# Patient Record
Sex: Male | Born: 1945
Health system: Southern US, Community
[De-identification: ages and names within clinical notes are randomized; demographics above are authoritative.]

## PROBLEM LIST (undated history)

## (undated) DIAGNOSIS — M5430 Sciatica, unspecified side: Secondary | ICD-10-CM

## (undated) DIAGNOSIS — M2550 Pain in unspecified joint: Secondary | ICD-10-CM

## (undated) DIAGNOSIS — M069 Rheumatoid arthritis, unspecified: Secondary | ICD-10-CM

## (undated) DIAGNOSIS — K219 Gastro-esophageal reflux disease without esophagitis: Secondary | ICD-10-CM

## (undated) DIAGNOSIS — I471 Supraventricular tachycardia: Secondary | ICD-10-CM

## (undated) DIAGNOSIS — I5189 Other ill-defined heart diseases: Secondary | ICD-10-CM

## (undated) DIAGNOSIS — H269 Unspecified cataract: Secondary | ICD-10-CM

## (undated) DIAGNOSIS — E7800 Pure hypercholesterolemia, unspecified: Secondary | ICD-10-CM

## (undated) DIAGNOSIS — R351 Nocturia: Secondary | ICD-10-CM

## (undated) DIAGNOSIS — G2581 Restless legs syndrome: Secondary | ICD-10-CM

## (undated) DIAGNOSIS — I491 Atrial premature depolarization: Secondary | ICD-10-CM

## (undated) DIAGNOSIS — K5900 Constipation, unspecified: Secondary | ICD-10-CM

## (undated) DIAGNOSIS — R011 Cardiac murmur, unspecified: Secondary | ICD-10-CM

## (undated) DIAGNOSIS — Z8709 Personal history of other diseases of the respiratory system: Secondary | ICD-10-CM

## (undated) DIAGNOSIS — K649 Unspecified hemorrhoids: Secondary | ICD-10-CM

## (undated) DIAGNOSIS — I4719 Other supraventricular tachycardia: Secondary | ICD-10-CM

## (undated) DIAGNOSIS — I951 Orthostatic hypotension: Secondary | ICD-10-CM

## (undated) DIAGNOSIS — I2510 Atherosclerotic heart disease of native coronary artery without angina pectoris: Secondary | ICD-10-CM

## (undated) DIAGNOSIS — I10 Essential (primary) hypertension: Secondary | ICD-10-CM

## (undated) DIAGNOSIS — R069 Unspecified abnormalities of breathing: Secondary | ICD-10-CM

## (undated) DIAGNOSIS — M1990 Unspecified osteoarthritis, unspecified site: Secondary | ICD-10-CM

## (undated) DIAGNOSIS — I493 Ventricular premature depolarization: Secondary | ICD-10-CM

## (undated) DIAGNOSIS — R002 Palpitations: Secondary | ICD-10-CM

## (undated) DIAGNOSIS — R001 Bradycardia, unspecified: Secondary | ICD-10-CM

## (undated) DIAGNOSIS — R079 Chest pain, unspecified: Secondary | ICD-10-CM

## (undated) HISTORY — PX: SHOULDER SURGERY: SHX246

## (undated) HISTORY — PX: TONSILLECTOMY: SUR1361

## (undated) HISTORY — DX: Restless legs syndrome: G25.81

## (undated) HISTORY — DX: Atherosclerotic heart disease of native coronary artery without angina pectoris: I25.10

## (undated) HISTORY — DX: Palpitations: R00.2

## (undated) HISTORY — DX: Sciatica, unspecified side: M54.30

## (undated) HISTORY — PX: KNEE ARTHROSCOPY: SUR90

## (undated) HISTORY — DX: Bradycardia, unspecified: R00.1

## (undated) HISTORY — DX: Orthostatic hypotension: I95.1

## (undated) HISTORY — DX: Ventricular premature depolarization: I49.3

## (undated) HISTORY — DX: Gastro-esophageal reflux disease without esophagitis: K21.9

## (undated) HISTORY — PX: ACHILLES TENDON REPAIR: SUR1153

## (undated) HISTORY — DX: Supraventricular tachycardia: I47.1

## (undated) HISTORY — DX: Unspecified osteoarthritis, unspecified site: M19.90

## (undated) HISTORY — DX: Chest pain, unspecified: R07.9

## (undated) HISTORY — PX: HAND SURGERY: SHX662

## (undated) HISTORY — DX: Essential (primary) hypertension: I10

## (undated) HISTORY — PX: FLEXIBLE SIGMOIDOSCOPY: SHX1649

## (undated) HISTORY — DX: Rheumatoid arthritis, unspecified: M06.9

## (undated) HISTORY — DX: Other supraventricular tachycardia: I47.19

## (undated) HISTORY — DX: Unspecified abnormalities of breathing: R06.9

## (undated) HISTORY — PX: TOE FUSION: SHX1070

## (undated) HISTORY — PX: HEEL SPUR EXCISION: SHX1733

## (undated) HISTORY — PX: COLONOSCOPY: SHX174

## (undated) HISTORY — DX: Pure hypercholesterolemia, unspecified: E78.00

## (undated) HISTORY — DX: Atrial premature depolarization: I49.1

## (undated) HISTORY — DX: Other ill-defined heart diseases: I51.89

---

## 2007-12-18 ENCOUNTER — Encounter: Admission: RE | Admit: 2007-12-18 | Discharge: 2007-12-18 | Payer: Self-pay | Admitting: Orthopaedic Surgery

## 2008-02-16 ENCOUNTER — Encounter: Admission: RE | Admit: 2008-02-16 | Discharge: 2008-02-16 | Payer: Self-pay | Admitting: Orthopaedic Surgery

## 2009-07-18 ENCOUNTER — Encounter: Admission: RE | Admit: 2009-07-18 | Discharge: 2009-07-18 | Payer: Self-pay | Admitting: Orthopaedic Surgery

## 2009-12-08 ENCOUNTER — Encounter: Admission: RE | Admit: 2009-12-08 | Discharge: 2009-12-08 | Payer: Self-pay | Admitting: Family Medicine

## 2010-01-07 ENCOUNTER — Ambulatory Visit (HOSPITAL_COMMUNITY): Admission: RE | Admit: 2010-01-07 | Discharge: 2010-01-07 | Payer: Self-pay | Admitting: Urology

## 2010-02-18 ENCOUNTER — Ambulatory Visit (HOSPITAL_COMMUNITY): Admission: RE | Admit: 2010-02-18 | Discharge: 2010-02-18 | Payer: Self-pay | Admitting: Urology

## 2010-12-26 HISTORY — PX: CARDIAC CATHETERIZATION: SHX172

## 2011-01-16 ENCOUNTER — Encounter: Payer: Self-pay | Admitting: Urology

## 2011-02-24 DIAGNOSIS — I2510 Atherosclerotic heart disease of native coronary artery without angina pectoris: Secondary | ICD-10-CM

## 2011-02-24 HISTORY — DX: Atherosclerotic heart disease of native coronary artery without angina pectoris: I25.10

## 2011-03-02 ENCOUNTER — Inpatient Hospital Stay (HOSPITAL_BASED_OUTPATIENT_CLINIC_OR_DEPARTMENT_OTHER)
Admission: RE | Admit: 2011-03-02 | Discharge: 2011-03-02 | Disposition: A | Discharge status: Home / Self Care | Payer: Federal, State, Local not specified - PPO | Source: Ambulatory Visit | Attending: Cardiology | Admitting: Cardiology

## 2011-03-02 DIAGNOSIS — I2510 Atherosclerotic heart disease of native coronary artery without angina pectoris: Secondary | ICD-10-CM | POA: Insufficient documentation

## 2011-03-02 DIAGNOSIS — R079 Chest pain, unspecified: Secondary | ICD-10-CM | POA: Insufficient documentation

## 2011-03-05 NOTE — Procedures (Signed)
NAME:  Christian [name], Christian [name]                 ACCOUNT NO.:  [id number]  MEDICAL RECORD NO.:  [id number]            PATIENT TYPE:  LOCATION:                                 FACILITY:  PHYSICIAN:  [name], M.D.          DATE OF BIRTH:  DATE OF PROCEDURE:  03/02/2011 DATE OF DISCHARGE:                           CARDIAC CATHETERIZATION   PROCEDURES:  Left heart catheterization, coronary angiography, left ventriculography.  OPERATOR:  [name], MD  INDICATIONS:  Chest pain.  COMPLICATIONS:  None.  IV access via right femoral artery 4-French sheath.  This is a 65 year old male who presents with episodes of chest pain and now presents for cardiac catheterization.  The patient was brought to the cardiac catheterization laboratory in a fasting nonsedated state.  Informed consent was obtained.  The patient was connected to continuous heart rate and pulse oximetry monitoring and blood pressure monitoring.  The right groin was prepped and draped in a sterile fashion.  A 1% Xylocaine was used for local anesthesia.  Using modified Seldinger technique, a 4-French sheath was placed in right femoral artery.  Under fluoroscopic guidance, a 4-French JL-4 catheter was placed in the left coronary artery.  Multiple cine films were taken at 30-degree RAO and 40-degree LAO views.  This catheter was then exchanged out over a guidewire for a 4-French 3-D RCA catheter which successfully engaged the right coronary ostium.  Multiple cine films were taken at 30-degree RAO and 40-degree LAO views.  This catheter was then exchanged out over a guidewire for a 4-French angled pigtail catheter which was placed under fluoroscopic guidance into left ventricular cavity.  Left ventriculography was performed in the 30- degree RAO views and total of 25 mL of contrast at 12 mL per second. The catheter was then pulled back across the aortic valve with no significant gradient noted.  At the end of procedure, all  catheters and sheaths were removed.  Manual compression was performed until adequate hemostasis was obtained.  The patient was transferred back to room in stable condition.  RESULTS: 1. Left main coronary is widely patent and bifurcates left anterior     descending artery and left circumflex artery. 2. Left anterior descending artery is widely patent throughout its     course with a 20-30% eccentric lesion in the midportion of the LAD.     The LAD gives rise to two diagonal branches.  Both diagonals are     very small.  The first diagonal has an 80% mid stenosis.  The     second diagonal is patent, but very small.  The left circumflex is widely patent throughout its course.  It gives rise to a first obtuse marginal branch which was small-to-moderate size and patent.  Just after the takeoff of the first obtuse marginal, there was a 20-30% eccentric lesion in the left circumflex.  The ongoing circumflex gives rise to a second obtuse marginal branch which is a large vessel and widely patent.  The right coronary is widely patent throughout its course distally and bifurcates into posterior descending artery and posterior lateral  artery.  There is an eccentric lesion of 20-30% in the proximal RCA. 1. Left ventriculography shows normal LV function, EF 60%, LV pressure     109/4 mmHg aortic pressure 108/70 mmHg, LVEDP 10 mmHg.  ASSESSMENT: 1. 80% small diagonal one too small for PCI. 2. Nonobstructive disease in left circumflex RCA and LAD. 3. Normal LV function. 4. Normal LVEDP.  PLAN:  Discharge home after IV fluid and bedrest.  Plan medical management, we will start Imdur 30 mg daily for possible angina induced by the tight diagonal stenosis which is too small for PCI.  The patient will follow up with me in 2 weeks.     [name], M.D.     TT/MEDQ  D:  03/03/2011  T:  03/04/2011  Job:  [id number]  Electronically Signed by [name] M.D. on 03/05/2011 02:07:15 PM

## 2011-03-13 LAB — CBC
HCT: 41.6 % (ref 39.0–52.0)
Hemoglobin: 14.4 g/dL (ref 13.0–17.0)
MCHC: 34.6 g/dL (ref 30.0–36.0)
MCV: 93.3 fL (ref 78.0–100.0)
Platelets: 193 10*3/uL (ref 150–400)
RBC: 4.46 MIL/uL (ref 4.22–5.81)
RDW: 13.1 % (ref 11.5–15.5)
WBC: 7.7 10*3/uL (ref 4.0–10.5)

## 2011-03-13 LAB — PROTIME-INR
INR: 0.96 (ref 0.00–1.49)
Prothrombin Time: 12.7 seconds (ref 11.6–15.2)

## 2011-03-13 LAB — BODY FLUID CULTURE
Culture: NO GROWTH
Gram Stain: NONE SEEN

## 2011-03-13 LAB — APTT: aPTT: 27 seconds (ref 24–37)

## 2011-03-18 LAB — CBC
HCT: 40.4 % (ref 39.0–52.0)
Hemoglobin: 14.2 g/dL (ref 13.0–17.0)
MCHC: 35.1 g/dL (ref 30.0–36.0)
MCV: 93.1 fL (ref 78.0–100.0)
Platelets: 206 10*3/uL (ref 150–400)
RBC: 4.34 MIL/uL (ref 4.22–5.81)
RDW: 13.2 % (ref 11.5–15.5)
WBC: 7.2 10*3/uL (ref 4.0–10.5)

## 2012-04-09 DIAGNOSIS — H04129 Dry eye syndrome of unspecified lacrimal gland: Secondary | ICD-10-CM | POA: Diagnosis not present

## 2012-04-09 DIAGNOSIS — H01139 Eczematous dermatitis of unspecified eye, unspecified eyelid: Secondary | ICD-10-CM | POA: Diagnosis not present

## 2012-04-09 DIAGNOSIS — H47329 Drusen of optic disc, unspecified eye: Secondary | ICD-10-CM | POA: Diagnosis not present

## 2012-04-09 DIAGNOSIS — H40019 Open angle with borderline findings, low risk, unspecified eye: Secondary | ICD-10-CM | POA: Diagnosis not present

## 2012-04-09 DIAGNOSIS — H2510 Age-related nuclear cataract, unspecified eye: Secondary | ICD-10-CM | POA: Diagnosis not present

## 2012-04-24 DIAGNOSIS — Z0000 Encounter for general adult medical examination without abnormal findings: Secondary | ICD-10-CM | POA: Diagnosis not present

## 2012-04-24 DIAGNOSIS — E7800 Pure hypercholesterolemia, unspecified: Secondary | ICD-10-CM | POA: Diagnosis not present

## 2012-04-24 DIAGNOSIS — Z23 Encounter for immunization: Secondary | ICD-10-CM | POA: Diagnosis not present

## 2012-04-24 DIAGNOSIS — I10 Essential (primary) hypertension: Secondary | ICD-10-CM | POA: Diagnosis not present

## 2012-04-24 DIAGNOSIS — Z1211 Encounter for screening for malignant neoplasm of colon: Secondary | ICD-10-CM | POA: Diagnosis not present

## 2012-04-24 DIAGNOSIS — Z125 Encounter for screening for malignant neoplasm of prostate: Secondary | ICD-10-CM | POA: Diagnosis not present

## 2012-04-24 DIAGNOSIS — M159 Polyosteoarthritis, unspecified: Secondary | ICD-10-CM | POA: Diagnosis not present

## 2012-04-30 DIAGNOSIS — E7800 Pure hypercholesterolemia, unspecified: Secondary | ICD-10-CM | POA: Diagnosis not present

## 2012-04-30 DIAGNOSIS — I2510 Atherosclerotic heart disease of native coronary artery without angina pectoris: Secondary | ICD-10-CM | POA: Diagnosis not present

## 2012-04-30 DIAGNOSIS — I10 Essential (primary) hypertension: Secondary | ICD-10-CM | POA: Diagnosis not present

## 2012-05-18 DIAGNOSIS — Z87891 Personal history of nicotine dependence: Secondary | ICD-10-CM | POA: Diagnosis not present

## 2012-05-22 ENCOUNTER — Other Ambulatory Visit: Payer: Self-pay | Admitting: Gastroenterology

## 2012-05-22 DIAGNOSIS — Z1211 Encounter for screening for malignant neoplasm of colon: Secondary | ICD-10-CM | POA: Diagnosis not present

## 2012-05-22 DIAGNOSIS — K620 Anal polyp: Secondary | ICD-10-CM | POA: Diagnosis not present

## 2012-05-22 DIAGNOSIS — D126 Benign neoplasm of colon, unspecified: Secondary | ICD-10-CM | POA: Diagnosis not present

## 2012-05-22 DIAGNOSIS — K621 Rectal polyp: Secondary | ICD-10-CM | POA: Diagnosis not present

## 2012-05-22 DIAGNOSIS — K648 Other hemorrhoids: Secondary | ICD-10-CM | POA: Diagnosis not present

## 2012-06-10 DIAGNOSIS — H5501 Congenital nystagmus: Secondary | ICD-10-CM | POA: Diagnosis not present

## 2012-06-10 DIAGNOSIS — L298 Other pruritus: Secondary | ICD-10-CM | POA: Diagnosis not present

## 2012-06-10 DIAGNOSIS — L2989 Other pruritus: Secondary | ICD-10-CM | POA: Diagnosis not present

## 2012-06-10 DIAGNOSIS — M79609 Pain in unspecified limb: Secondary | ICD-10-CM | POA: Diagnosis not present

## 2012-06-10 DIAGNOSIS — R42 Dizziness and giddiness: Secondary | ICD-10-CM | POA: Diagnosis not present

## 2012-06-22 DIAGNOSIS — Z0000 Encounter for general adult medical examination without abnormal findings: Secondary | ICD-10-CM | POA: Diagnosis not present

## 2012-06-22 DIAGNOSIS — R42 Dizziness and giddiness: Secondary | ICD-10-CM | POA: Diagnosis not present

## 2012-06-22 DIAGNOSIS — H5501 Congenital nystagmus: Secondary | ICD-10-CM | POA: Diagnosis not present

## 2012-06-22 DIAGNOSIS — E7800 Pure hypercholesterolemia, unspecified: Secondary | ICD-10-CM | POA: Diagnosis not present

## 2012-06-22 DIAGNOSIS — Z79899 Other long term (current) drug therapy: Secondary | ICD-10-CM | POA: Diagnosis not present

## 2012-06-22 DIAGNOSIS — M79609 Pain in unspecified limb: Secondary | ICD-10-CM | POA: Diagnosis not present

## 2012-06-22 DIAGNOSIS — I10 Essential (primary) hypertension: Secondary | ICD-10-CM | POA: Diagnosis not present

## 2012-06-22 DIAGNOSIS — M159 Polyosteoarthritis, unspecified: Secondary | ICD-10-CM | POA: Diagnosis not present

## 2012-10-22 DIAGNOSIS — Z23 Encounter for immunization: Secondary | ICD-10-CM | POA: Diagnosis not present

## 2012-10-22 DIAGNOSIS — E7800 Pure hypercholesterolemia, unspecified: Secondary | ICD-10-CM | POA: Diagnosis not present

## 2012-10-22 DIAGNOSIS — M159 Polyosteoarthritis, unspecified: Secondary | ICD-10-CM | POA: Diagnosis not present

## 2012-10-22 DIAGNOSIS — Z79899 Other long term (current) drug therapy: Secondary | ICD-10-CM | POA: Diagnosis not present

## 2012-10-22 DIAGNOSIS — L509 Urticaria, unspecified: Secondary | ICD-10-CM | POA: Diagnosis not present

## 2012-10-22 DIAGNOSIS — I10 Essential (primary) hypertension: Secondary | ICD-10-CM | POA: Diagnosis not present

## 2012-11-06 DIAGNOSIS — I2510 Atherosclerotic heart disease of native coronary artery without angina pectoris: Secondary | ICD-10-CM | POA: Diagnosis not present

## 2012-11-06 DIAGNOSIS — E7800 Pure hypercholesterolemia, unspecified: Secondary | ICD-10-CM | POA: Diagnosis not present

## 2012-11-06 DIAGNOSIS — I10 Essential (primary) hypertension: Secondary | ICD-10-CM | POA: Diagnosis not present

## 2012-11-06 DIAGNOSIS — Z79899 Other long term (current) drug therapy: Secondary | ICD-10-CM | POA: Diagnosis not present

## 2012-12-14 DIAGNOSIS — Z79899 Other long term (current) drug therapy: Secondary | ICD-10-CM | POA: Diagnosis not present

## 2012-12-14 DIAGNOSIS — E7800 Pure hypercholesterolemia, unspecified: Secondary | ICD-10-CM | POA: Diagnosis not present

## 2013-02-04 DIAGNOSIS — M79609 Pain in unspecified limb: Secondary | ICD-10-CM | POA: Diagnosis not present

## 2013-02-04 DIAGNOSIS — E7800 Pure hypercholesterolemia, unspecified: Secondary | ICD-10-CM | POA: Diagnosis not present

## 2013-04-10 DIAGNOSIS — H40019 Open angle with borderline findings, low risk, unspecified eye: Secondary | ICD-10-CM | POA: Diagnosis not present

## 2013-04-10 DIAGNOSIS — H2510 Age-related nuclear cataract, unspecified eye: Secondary | ICD-10-CM | POA: Diagnosis not present

## 2013-04-10 DIAGNOSIS — H538 Other visual disturbances: Secondary | ICD-10-CM | POA: Diagnosis not present

## 2013-04-10 DIAGNOSIS — H04129 Dry eye syndrome of unspecified lacrimal gland: Secondary | ICD-10-CM | POA: Diagnosis not present

## 2013-04-22 DIAGNOSIS — M159 Polyosteoarthritis, unspecified: Secondary | ICD-10-CM | POA: Diagnosis not present

## 2013-04-22 DIAGNOSIS — I10 Essential (primary) hypertension: Secondary | ICD-10-CM | POA: Diagnosis not present

## 2013-04-22 DIAGNOSIS — E7800 Pure hypercholesterolemia, unspecified: Secondary | ICD-10-CM | POA: Diagnosis not present

## 2013-04-22 DIAGNOSIS — Z125 Encounter for screening for malignant neoplasm of prostate: Secondary | ICD-10-CM | POA: Diagnosis not present

## 2013-05-08 DIAGNOSIS — Z79899 Other long term (current) drug therapy: Secondary | ICD-10-CM | POA: Diagnosis not present

## 2013-05-08 DIAGNOSIS — E7800 Pure hypercholesterolemia, unspecified: Secondary | ICD-10-CM | POA: Diagnosis not present

## 2013-05-08 DIAGNOSIS — I10 Essential (primary) hypertension: Secondary | ICD-10-CM | POA: Diagnosis not present

## 2013-05-08 DIAGNOSIS — I2510 Atherosclerotic heart disease of native coronary artery without angina pectoris: Secondary | ICD-10-CM | POA: Diagnosis not present

## 2013-06-19 DIAGNOSIS — E7800 Pure hypercholesterolemia, unspecified: Secondary | ICD-10-CM | POA: Diagnosis not present

## 2013-06-19 DIAGNOSIS — Z79899 Other long term (current) drug therapy: Secondary | ICD-10-CM | POA: Diagnosis not present

## 2013-09-14 ENCOUNTER — Other Ambulatory Visit: Payer: Self-pay | Admitting: Cardiology

## 2013-09-14 DIAGNOSIS — E7800 Pure hypercholesterolemia, unspecified: Secondary | ICD-10-CM

## 2013-09-14 DIAGNOSIS — Z79899 Other long term (current) drug therapy: Secondary | ICD-10-CM

## 2013-10-22 DIAGNOSIS — G2581 Restless legs syndrome: Secondary | ICD-10-CM | POA: Diagnosis not present

## 2013-10-22 DIAGNOSIS — Z23 Encounter for immunization: Secondary | ICD-10-CM | POA: Diagnosis not present

## 2013-10-22 DIAGNOSIS — L2089 Other atopic dermatitis: Secondary | ICD-10-CM | POA: Diagnosis not present

## 2013-10-22 DIAGNOSIS — E7800 Pure hypercholesterolemia, unspecified: Secondary | ICD-10-CM | POA: Diagnosis not present

## 2013-10-22 DIAGNOSIS — M159 Polyosteoarthritis, unspecified: Secondary | ICD-10-CM | POA: Diagnosis not present

## 2013-10-22 DIAGNOSIS — I10 Essential (primary) hypertension: Secondary | ICD-10-CM | POA: Diagnosis not present

## 2013-11-13 ENCOUNTER — Encounter: Payer: Self-pay | Admitting: Cardiology

## 2013-11-13 DIAGNOSIS — E7800 Pure hypercholesterolemia, unspecified: Secondary | ICD-10-CM | POA: Insufficient documentation

## 2013-11-13 DIAGNOSIS — I951 Orthostatic hypotension: Secondary | ICD-10-CM | POA: Insufficient documentation

## 2013-11-15 ENCOUNTER — Telehealth: Payer: Self-pay | Admitting: Cardiology

## 2013-11-15 ENCOUNTER — Encounter: Payer: Self-pay | Admitting: Cardiology

## 2013-11-15 ENCOUNTER — Telehealth: Payer: Self-pay | Admitting: Pharmacist

## 2013-11-15 ENCOUNTER — Ambulatory Visit (INDEPENDENT_AMBULATORY_CARE_PROVIDER_SITE_OTHER): Payer: Medicare Other | Admitting: Cardiology

## 2013-11-15 VITALS — BP 130/90 | HR 72 | Ht 71.0 in | Wt 276.0 lb

## 2013-11-15 DIAGNOSIS — E7800 Pure hypercholesterolemia, unspecified: Secondary | ICD-10-CM

## 2013-11-15 DIAGNOSIS — I5189 Other ill-defined heart diseases: Secondary | ICD-10-CM | POA: Insufficient documentation

## 2013-11-15 DIAGNOSIS — Z79899 Other long term (current) drug therapy: Secondary | ICD-10-CM

## 2013-11-15 DIAGNOSIS — I951 Orthostatic hypotension: Secondary | ICD-10-CM

## 2013-11-15 DIAGNOSIS — I519 Heart disease, unspecified: Secondary | ICD-10-CM

## 2013-11-15 DIAGNOSIS — I2510 Atherosclerotic heart disease of native coronary artery without angina pectoris: Secondary | ICD-10-CM

## 2013-11-15 DIAGNOSIS — I10 Essential (primary) hypertension: Secondary | ICD-10-CM

## 2013-11-15 DIAGNOSIS — E785 Hyperlipidemia, unspecified: Secondary | ICD-10-CM

## 2013-11-15 LAB — BASIC METABOLIC PANEL
CO2: 28 mEq/L (ref 19–32)
Chloride: 104 mEq/L (ref 96–112)
GFR: 68.87 mL/min (ref >60.00)

## 2013-11-15 LAB — LIPID PANEL
Cholesterol: 147 mg/dL (ref 0–200)
LDL Cholesterol: 86 mg/dL (ref 0–99)
VLDL: 14.6 mg/dL (ref 0.0–40.0)

## 2013-11-15 MED ORDER — ATORVASTATIN CALCIUM 10 MG PO TABS: 10.0000 mg | ORAL_TABLET | Freq: Every day | ORAL | Status: DC

## 2013-11-15 NOTE — Telephone Encounter (Signed)
Patient insurance cost on Crestor is going up, and he needs to change to a different therapy.  He is currently on Crestor 5 mg qd, as he can't tolerate a higher dose.  Patient failed simvastatin, and couldn't tolerate lipitor higher than 10 mg daily.  LDL is 86 mg/dL today on Crestor 5 mg qd.  We will change him back to atoravastatin for cost reasons, and start him at just 5 mg qd for 4 weeks, then increase to lipitor 10 mg qd thereafter.  Patient agreeable to this.  He has some lipitor 10 mg at home still.  He will run out of Crestor in 4 weeks, then start lipitor 10 mg at 1/2 tablet daily, then increase up to 10 mg daily thereafter.  Patient will add Co-Q 10 if for some reasons muscle aches come back while on Lipitor.  He will recheck bloodwork in 4 months, then see me 1 day later.  Rx sent to pharmacy, and lab set up.  Forwarding to Dr. Mayford Knife as Lorain Childes.

## 2013-11-15 NOTE — Telephone Encounter (Signed)
New Problem  Pt requests a call back to discuss cholesterol medication// please call

## 2013-11-15 NOTE — Telephone Encounter (Signed)
Discussed with patient. See other phone message.

## 2013-11-15 NOTE — Patient Instructions (Signed)
Your physician recommends that you continue on your current medications as directed. Please refer to the Current Medication list given to you today.  Your physician recommends that you go to the lab today for BMET, Lipid Panel, and ALT  Your physician wants you to follow-up in: 6 Months with Dr Sherlyn Lick will receive a reminder letter in the mail two months in advance. If you don't receive a letter, please call our office to schedule the follow-up appointment.

## 2013-11-15 NOTE — Progress Notes (Addendum)
[address] [place], [place]  [address] Phone: [phone] Fax:  [phone]  Date:  11/15/2013   ID:  IHAN PAT, DOB Feb 15, 1946, MRN [id number]  PCP:  Lupe Carney, MD  Cardiologist:  [name], MD     History of Present Illness: Christian [name] is a 67 y.o. male with a history of ASCAD, HTN and dyslipidemia who presents today for followup. He is doing well.  He denies any chest pain, SOB, DOE, LE edema, dizziness, palpitations or syncope.  He needs to change lipid lowering agent in January due to change in insurance.    Wt Readings from Last 3 Encounters:  11/15/13 276 lb (125.193 kg)     Past Medical History  Diagnosis Date  . Orthostatic hypotension   . Sciatica   . Arthritis   . Renal cyst, left   . Coronary artery disease 3/12    80% stenosis of diagonal too small for PCI  . Hypercholesterolemia   . Diastolic dysfunction   . Hypertension     Current Outpatient Prescriptions  Medication Sig Dispense Refill  . aspirin 81 MG tablet Take 81 mg by mouth daily.      . diphenhydrAMINE (BENADRYL) 25 MG tablet Take 25 mg by mouth 2 (two) times daily as needed.      [name] Kitchen ibuprofen (ADVIL,MOTRIN) 200 MG tablet Take 200 mg by mouth every 4 (four) hours as needed.      . isosorbide mononitrate (IMDUR) 30 MG 24 hr tablet Take 30 mg by mouth daily.      [name] Kitchen lisinopril (PRINIVIL,ZESTRIL) 10 MG tablet Take 10 mg by mouth daily.      . nitroGLYCERIN (NITROSTAT) 0.4 MG SL tablet Place 0.4 mg under the tongue every 5 (five) minutes as needed for chest pain.      . rosuvastatin (CRESTOR) 5 MG tablet Take 5 mg by mouth daily.        No current facility-administered medications for this visit.    Allergies:    Allergies  Allergen Reactions  . Atorvastatin Calcium [Atorvastatin] Other (See Comments)    Myalgia  . Pravastatin Other (See Comments)    Leg Cramps  . Simvastatin Other (See Comments)    Myalgia    Social History:  The patient  reports that he  quit smoking about 31 years ago. He does not have any smokeless tobacco history on file. He reports that he drinks alcohol. He reports that he does not use illicit drugs.   Family History:  The patient's family history includes CVA in his father and mother; Heart disease in his father.   ROS:  Please see the history of present illness.      All other systems reviewed and negative.   PHYSICAL EXAM: VS:  BP 130/90  Pulse 72  Ht 5\' 11"  (1.803 m)  Wt 276 lb (125.193 kg)  BMI 38.51 kg/m2 Well nourished, well developed, in no acute distress HEENT: normal Neck: no JVD Cardiac:  normal S1, S2; RRR; no murmur Lungs:  clear to auscultation bilaterally, no wheezing, rhonchi or rales Abd: soft, nontender, no hepatomegaly Ext: no edema Skin: warm and dry Neuro:  CNs 2-12 intact, no focal abnormalities noted  EKG:  Sinus bradycardia at 50bpm  ASSESSMENT AND PLAN:  1. ASCAD with no angina  - continue ASA/Imdur 2. HTN - mildly elevated DBP this am - at home it runs 110/18mmHg  - he will continue on Lisinopril   - check BMET 3.  Dyslipidemia - his insurance is going up in January and he says he cannot afford his med and needs a different med.  - will recheck fasting lipids and ALT  - lipid clinic for guidance in statin to change to 4.  Orthostatic hypotension - fairly well controlled - still with mild dizziness on occasiona with going from sitting to standing 5.  Asymptomatic bradycardia  Followup with me in 6 months  Signed, [name], MD 11/15/2013 9:13 AM

## 2013-12-24 ENCOUNTER — Other Ambulatory Visit: Payer: Federal, State, Local not specified - PPO

## 2013-12-27 DIAGNOSIS — M25569 Pain in unspecified knee: Secondary | ICD-10-CM | POA: Diagnosis not present

## 2013-12-27 DIAGNOSIS — M25559 Pain in unspecified hip: Secondary | ICD-10-CM | POA: Diagnosis not present

## 2014-01-20 DIAGNOSIS — M25569 Pain in unspecified knee: Secondary | ICD-10-CM | POA: Diagnosis not present

## 2014-02-03 DIAGNOSIS — M1710 Unilateral primary osteoarthritis, unspecified knee: Secondary | ICD-10-CM | POA: Diagnosis not present

## 2014-02-10 DIAGNOSIS — M1710 Unilateral primary osteoarthritis, unspecified knee: Secondary | ICD-10-CM | POA: Diagnosis not present

## 2014-02-11 ENCOUNTER — Encounter: Payer: Self-pay | Admitting: Pharmacist

## 2014-02-12 ENCOUNTER — Other Ambulatory Visit: Payer: Self-pay | Admitting: General Surgery

## 2014-02-12 MED ORDER — LISINOPRIL 10 MG PO TABS: 10.0000 mg | ORAL_TABLET | Freq: Every day | ORAL | Status: DC

## 2014-02-12 MED ORDER — ROSUVASTATIN CALCIUM 5 MG PO TABS: 5.0000 mg | ORAL_TABLET | Freq: Every day | ORAL | Status: DC

## 2014-02-17 DIAGNOSIS — M1710 Unilateral primary osteoarthritis, unspecified knee: Secondary | ICD-10-CM | POA: Diagnosis not present

## 2014-02-24 DIAGNOSIS — M1710 Unilateral primary osteoarthritis, unspecified knee: Secondary | ICD-10-CM | POA: Diagnosis not present

## 2014-03-03 DIAGNOSIS — M1710 Unilateral primary osteoarthritis, unspecified knee: Secondary | ICD-10-CM | POA: Diagnosis not present

## 2014-03-04 ENCOUNTER — Other Ambulatory Visit (INDEPENDENT_AMBULATORY_CARE_PROVIDER_SITE_OTHER): Payer: Medicare Other

## 2014-03-04 DIAGNOSIS — E785 Hyperlipidemia, unspecified: Secondary | ICD-10-CM | POA: Diagnosis not present

## 2014-03-04 DIAGNOSIS — Z79899 Other long term (current) drug therapy: Secondary | ICD-10-CM

## 2014-03-04 LAB — LIPID PANEL
CHOLESTEROL: 149 mg/dL (ref 0–200)
HDL: 47.4 mg/dL (ref >39.00)
LDL CALC: 87 mg/dL (ref 0–99)
TRIGLYCERIDES: 74 mg/dL (ref 0.0–149.0)
Total CHOL/HDL Ratio: 3
VLDL: 14.8 mg/dL (ref 0.0–40.0)

## 2014-03-04 LAB — HEPATIC FUNCTION PANEL
ALT: 11 U/L (ref 0–53)
AST: 16 U/L (ref 0–37)
Albumin: 3.8 g/dL (ref 3.5–5.2)
Alkaline Phosphatase: 56 U/L (ref 39–117)
BILIRUBIN DIRECT: 0 mg/dL (ref 0.0–0.3)
TOTAL PROTEIN: 6.8 g/dL (ref 6.0–8.3)
Total Bilirubin: 0.8 mg/dL (ref 0.3–1.2)

## 2014-03-05 ENCOUNTER — Ambulatory Visit (INDEPENDENT_AMBULATORY_CARE_PROVIDER_SITE_OTHER): Payer: Medicare Other | Admitting: Pharmacist

## 2014-03-05 VITALS — Wt 283.0 lb

## 2014-03-05 DIAGNOSIS — Z79899 Other long term (current) drug therapy: Secondary | ICD-10-CM | POA: Diagnosis not present

## 2014-03-05 DIAGNOSIS — E785 Hyperlipidemia, unspecified: Secondary | ICD-10-CM

## 2014-03-05 DIAGNOSIS — E7800 Pure hypercholesterolemia, unspecified: Secondary | ICD-10-CM

## 2014-03-05 MED ORDER — PSYLLIUM 28 % PO PACK
1.0000 | PACK | Freq: Two times a day (BID) | ORAL | Status: DC
Start: 2014-03-05 — End: 2014-04-24

## 2014-03-05 NOTE — Progress Notes (Signed)
Patient is a pleasant 68 y.o. WM referred to lipid clinic by [name] given elevated LDL and h/o failing multiple statins.  Patient's LDL is ~ 86 mg/dL historically while on Crestor 5 mg qd.  He failed simvastatin, pravastatin, and atorvastatin, and has been apprehensive about increasing Crestor any further since this is the only statin/dose he's been able to tolerate.  His baseline LDL is ~ 140 mg/dL.  Patient has gained 8-10 lbs over past few months as it has been cold out and he hasn't been as active.  His diet has also been worse over past few months, eating more desserts and fried foods.  He came in to discuss options today.  RF:  CAD (02/2011) - vessel too small for PCI, HTN, age - LDL goal < 70, non-HDL goal < 100 Meds:  Crestor 5 mg qd Intolerant:  simvastatin, pravastatin, and atorvastatin (muscle aches)  Social history:  Denies tobacco use.  Does drink some alcohol. Diet:  Eats oatmeal typically for breakfast.  Lunch which is his largest meal of the day is typically eaten at home.  Eats out 2 days a week.  He has been eaten more fried foods lately.  Evening meal is typically fruit and cereal.  He does eat desserts most evenings if it is in the house (cookies, cake, etc).  If his wife doesn't have it sitting out, he doesn't eat it.  He plans on cutting back on desserts. Exercise:  Uses his bike (outside) 7 days a week and does ~ 8 miles (1 hour) nearly every day of the week.  This was less over past few months due to colder weather, but will pick up now that getting warmer.  Labs:   02/2014:  TC 147, LDL 86, TG 74, HDL 48, LFTs normal  (Crestor 5 mg qd)  Current Outpatient Prescriptions  Medication Sig Dispense Refill  . aspirin 81 MG tablet Take 81 mg by mouth daily.      . diphenhydrAMINE (BENADRYL) 25 MG tablet Take 25 mg by mouth 2 (two) times daily as needed.      [name] Kitchen ibuprofen (ADVIL,MOTRIN) 200 MG tablet Take 200 mg by mouth every 4 (four) hours as needed.      . isosorbide  mononitrate (IMDUR) 30 MG 24 hr tablet Take 30 mg by mouth daily.      [name] Kitchen lisinopril (PRINIVIL,ZESTRIL) 10 MG tablet Take 1 tablet (10 mg total) by mouth daily.  90 tablet  3  . nitroGLYCERIN (NITROSTAT) 0.4 MG SL tablet Place 0.4 mg under the tongue every 5 (five) minutes as needed for chest pain.      . rosuvastatin (CRESTOR) 5 MG tablet Take 1 tablet (5 mg total) by mouth daily.       No current facility-administered medications for this visit.   Allergies  Allergen Reactions  . Atorvastatin Calcium [Atorvastatin] Other (See Comments)    Myalgia  . Pravastatin Other (See Comments)    Leg Cramps  . Simvastatin Other (See Comments)    Myalgia   Family History  Problem Relation Age of Onset  . CVA Mother   . CVA Father   . Heart disease Father

## 2014-03-05 NOTE — Patient Instructions (Signed)
1.  Continue Crestor 5 mg once daily. 2.  Start taking fiber powder supplement (i.e. Metamucil) twice daily prior to two largest meals of the day.  1 tablespoon with 8 ounces of water or juice. 3.  Continue biking 7 days per week.  After surgery may be able to increase length of time. 4.  Cut down on desserts and fried foods. 5.  If looking for a health snack that gives energy, can use 1/2 cup of unsalted almonds daily. 6.  Recheck cholesterol in 6 months (fasting labs 09/15/14 show up anytime after 7:30 am), see Ysidro Evert next day on 09/16/14 at 10:00 am.

## 2014-03-05 NOTE — Assessment & Plan Note (Addendum)
Patient needs to lose at least 10-15 lbs which will hopefully help his cholesterol moving forward. He is meeting with orthopedic surgeon next month and may be getting right knee surgery done sometime in the near future.  This may help him exercise more frequently given OA in his right knee.  Instead of increasing Crestor dose, will give 6 months of diet changes, increase in fiber, and using almonds as a daily snack to hopefully bring down cholesterol this way.  Patient agreeable to this, and if in 6 months LDL still elevated, may consider crestor 10 mg at that time.  Hopefully losing significant weight will help.  Patient will add metamucil twice daily to his daily Crestor for weight loss and cholesterol reasons. Plan: 1.  Continue Crestor 5 mg once daily. 2.  Start taking fiber powder supplement (i.e. Metamucil) twice daily prior to two largest meals of the day.  1 tablespoon with 8 ounces of water or juice. 3.  Continue biking 7 days per week.  After surgery may be able to increase length of time. 4.  Cut down on desserts and fried foods. 5.  If looking for a health snack that gives energy, can use 1/2 cup of unsalted almonds daily. 6.  Recheck cholesterol in 6 months (fasting labs 09/15/14 show up anytime after 7:30 am), see Ysidro Evert next day on 09/16/14 at 10:00 am.

## 2014-04-04 DIAGNOSIS — M1710 Unilateral primary osteoarthritis, unspecified knee: Secondary | ICD-10-CM | POA: Diagnosis not present

## 2014-04-08 ENCOUNTER — Telehealth: Payer: Self-pay | Admitting: Cardiology

## 2014-04-08 ENCOUNTER — Ambulatory Visit (INDEPENDENT_AMBULATORY_CARE_PROVIDER_SITE_OTHER): Payer: Medicare Other | Admitting: Cardiology

## 2014-04-08 ENCOUNTER — Encounter: Payer: Self-pay | Admitting: Cardiology

## 2014-04-08 VITALS — BP 164/94 | HR 72 | Ht 71.0 in | Wt 283.0 lb

## 2014-04-08 DIAGNOSIS — I10 Essential (primary) hypertension: Secondary | ICD-10-CM

## 2014-04-08 DIAGNOSIS — Z01818 Encounter for other preprocedural examination: Secondary | ICD-10-CM | POA: Insufficient documentation

## 2014-04-08 DIAGNOSIS — M25569 Pain in unspecified knee: Secondary | ICD-10-CM | POA: Diagnosis not present

## 2014-04-08 DIAGNOSIS — I2510 Atherosclerotic heart disease of native coronary artery without angina pectoris: Secondary | ICD-10-CM

## 2014-04-08 DIAGNOSIS — E7800 Pure hypercholesterolemia, unspecified: Secondary | ICD-10-CM

## 2014-04-08 MED ORDER — ROSUVASTATIN CALCIUM 10 MG PO TABS: 10.0000 mg | ORAL_TABLET | Freq: Every day | ORAL | Status: DC

## 2014-04-08 NOTE — Progress Notes (Signed)
[address], [place], [place]  [address] Phone: [phone] Fax:  [phone]  Date:  04/08/2014   ID:  BENINO KORINEK, DOB [birth year], MRN [id number]  PCP:  Donnie Coffin, MD  Cardiologist:  Fransico Him, MD     History of Present Illness: Christian [name] is a 68 y.o. male with a history of ASCAD, HTN and dyslipidemia who presents today for followup. He is doing well. He denies any chest pain, SOB, DOE, LE edema, dizziness, palpitations or syncope. He needs preoperative clearance for knee surgery.  He rides his bike outside 7 days weekly for 11 miles each time and has no chest pain or SOB.      Wt Readings from Last 3 Encounters:  04/08/14 283 lb (128.368 kg)  03/05/14 283 lb (128.368 kg)  11/15/13 276 lb (125.193 kg)     Past Medical History  Diagnosis Date  . Orthostatic hypotension   . Sciatica   . Arthritis   . Renal cyst, left   . Coronary artery disease 3/12    80% stenosis of diagonal too small for PCI  . Hypercholesterolemia   . Diastolic dysfunction   . Hypertension     Current Outpatient Prescriptions  Medication Sig Dispense Refill  . aspirin 81 MG tablet Take 81 mg by mouth daily.      . Blood Pressure Monitoring (5 SERIES BP MONITOR) DEVI       . diphenhydrAMINE (BENADRYL) 25 MG tablet Take 25 mg by mouth 2 (two) times daily as needed.      [name] Kitchen ibuprofen (ADVIL,MOTRIN) 200 MG tablet Take 200 mg by mouth every 4 (four) hours as needed.      . isosorbide mononitrate (IMDUR) 30 MG 24 hr tablet Take 30 mg by mouth daily.      [name] Kitchen lisinopril (PRINIVIL,ZESTRIL) 10 MG tablet Take 1 tablet (10 mg total) by mouth daily.  90 tablet  3  . nitroGLYCERIN (NITROSTAT) 0.4 MG SL tablet Place 0.4 mg under the tongue every 5 (five) minutes as needed for chest pain.      [name] Kitchen psyllium (METAMUCIL SMOOTH TEXTURE) 28 % packet Take 1 packet by mouth 2 (two) times daily.      . rosuvastatin (CRESTOR) 5 MG tablet Take 1 tablet (5 mg total) by mouth daily.       No  current facility-administered medications for this visit.    Allergies:    Allergies  Allergen Reactions  . Atorvastatin Calcium [Atorvastatin] Other (See Comments)    Myalgia  . Pravastatin Other (See Comments)    Leg Cramps  . Simvastatin Other (See Comments)    Myalgia    Social History:  The patient  reports that he quit smoking about 32 years ago. He does not have any smokeless tobacco history on file. He reports that he drinks alcohol. He reports that he does not use illicit drugs.   Family History:  The patient's family history includes CVA in his father and mother; Heart disease in his father.   ROS:  Please see the history of present illness.      All other systems reviewed and negative.   PHYSICAL EXAM: VS:  BP 164/94  Pulse 72  Ht 5\' 11"  (1.803 m)  Wt 283 lb (128.368 kg)  BMI 39.49 kg/m2 Well nourished, well developed, in no acute distress HEENT: normal Neck: no JVD Cardiac:  normal S1, S2; RRR; no murmur Lungs:  clear to auscultation bilaterally, no wheezing, rhonchi or rales  Abd: soft, nontender, no hepatomegaly Ext: no edema  EKG:  Sinus bradycardia with sinus arrhythmia, IRBBB  ASSESSMENT/PLAN:    1.  ASCAD with no angina - continue ASA/Imdur  2.  HTN - mildly elevated DBP this am - at home it runs 110/44mmHg - he will continue on Lisinopril  3.  Dyslipidemia - his last LDL was not at goal last month which was 87 - Increase Crestor to 10mg  daily - recheck fasting lipid and ALT in 6 weeks 4. Orthostatic hypotension - fairly well controlled 5. Asymptomatic bradycardia resolved 6. Knee pain awaiting TKR 7. Preoperative clearance - he can ride his bike 11 miles 6 days weekly without any anginal symptoms.  He is low risk from a cardiac standpoint for knee replacement and ok to proceed with surgery.  Followup with me in 6 months   Signed, Fransico Him, MD 04/08/2014 8:41 AM

## 2014-04-08 NOTE — Patient Instructions (Signed)
Your physician has recommended you make the following change in your medication: 1. Increase Crestor to 10 MG 1 tablet daily  Your physician recommends that you return for lab work in: 6 weeks on 05/20/14 for Fasting LIPID and ALT  Dr Radford Pax has cleared you from a Cardiac Standpoint for Surgery. We will fax your form to [facility] for you.   Your physician wants you to follow-up in: 6 months with Dr Mallie Snooks will receive a reminder letter in the mail two months in advance. If you don't receive a letter, please call our office to schedule the follow-up appointment.

## 2014-04-08 NOTE — Telephone Encounter (Signed)
Received request from Nurse fax box, documents faxed for surgical clearance. To: [facility] Fax number: [phone] Attention: [phone]/kdm

## 2014-04-09 ENCOUNTER — Other Ambulatory Visit (HOSPITAL_COMMUNITY): Payer: Self-pay | Admitting: Orthopaedic Surgery

## 2014-04-09 DIAGNOSIS — I10 Essential (primary) hypertension: Secondary | ICD-10-CM | POA: Diagnosis not present

## 2014-04-09 DIAGNOSIS — E7800 Pure hypercholesterolemia, unspecified: Secondary | ICD-10-CM | POA: Diagnosis not present

## 2014-04-09 DIAGNOSIS — M159 Polyosteoarthritis, unspecified: Secondary | ICD-10-CM | POA: Diagnosis not present

## 2014-04-09 DIAGNOSIS — Z23 Encounter for immunization: Secondary | ICD-10-CM | POA: Diagnosis not present

## 2014-04-24 ENCOUNTER — Encounter (HOSPITAL_COMMUNITY): Payer: Self-pay | Admitting: Pharmacy Technician

## 2014-04-25 DIAGNOSIS — H40019 Open angle with borderline findings, low risk, unspecified eye: Secondary | ICD-10-CM | POA: Diagnosis not present

## 2014-04-25 DIAGNOSIS — H2510 Age-related nuclear cataract, unspecified eye: Secondary | ICD-10-CM | POA: Diagnosis not present

## 2014-04-25 DIAGNOSIS — H47329 Drusen of optic disc, unspecified eye: Secondary | ICD-10-CM | POA: Diagnosis not present

## 2014-04-25 DIAGNOSIS — H524 Presbyopia: Secondary | ICD-10-CM | POA: Diagnosis not present

## 2014-04-28 NOTE — Pre-Procedure Instructions (Signed)
MASARU CHAMBERLIN  04/28/2014   Your procedure is scheduled on:  Tues, May 12 @ 7:30 AM  Report to [facility]  at 5:30 AM.  Call this number if you have problems the morning of surgery: [phone]   Remember:   Do not eat food or drink liquids after midnight.   Take these medicines the morning of surgery with A SIP OF WATER: Isosorbide(Imdur)               Stop taking Aspirin and Ibuprofen. No Goody's,BC's,Aleve,Fish Oil,or any Herbal Medications   Do not wear jewelry  Do not wear lotions, powders, or colognes. You may wear deodorant.  Men may shave face and neck.  Do not bring valuables to the hospital.  [facility] is not responsible                  for any belongings or valuables.               Contacts, dentures or bridgework may not be worn into surgery.  Leave suitcase in the car. After surgery it may be brought to your room.  For patients admitted to the hospital, discharge time is determined by your                treatment team.                  Special Instructions:   - Preparing for Surgery  Before surgery, you can play an important role.  Because skin is not sterile, your skin needs to be as free of germs as possible.  You can reduce the number of germs on you skin by washing with CHG (chlorahexidine gluconate) soap before surgery.  CHG is an antiseptic cleaner which kills germs and bonds with the skin to continue killing germs even after washing.  Please DO NOT use if you have an allergy to CHG or antibacterial soaps.  If your skin becomes reddened/irritated stop using the CHG and inform your nurse when you arrive at Short Stay.  Do not shave (including legs and underarms) for at least 48 hours prior to the first CHG shower.  You may shave your face.  Please follow these instructions carefully:   1.  Shower with CHG Soap the night before surgery and the                                morning of Surgery.  2.  If you choose to wash your hair, wash  your hair first as usual with your       normal shampoo.  3.  After you shampoo, rinse your hair and body thoroughly to remove the                      Shampoo.  4.  Use CHG as you would any other liquid soap.  You can apply chg directly       to the skin and wash gently with scrungie or a clean washcloth.  5.  Apply the CHG Soap to your body ONLY FROM THE NECK DOWN.        Do not use on open wounds or open sores.  Avoid contact with your eyes,       ears, mouth and genitals (private parts).  Wash genitals (private parts)       with your normal soap.  6.  Wash thoroughly, paying special attention to the area where your surgery        will be performed.  7.  Thoroughly rinse your body with warm water from the neck down.  8.  DO NOT shower/wash with your normal soap after using and rinsing off       the CHG Soap.  9.  Pat yourself dry with a clean towel.            10.  Wear clean pajamas.            11.  Place clean sheets on your bed the night of your first shower and do not        sleep with pets.  Day of Surgery  Do not apply any lotions/deoderants the morning of surgery.  Please wear clean clothes to the hospital/surgery center.     Please read over the following fact sheets that you were given: Pain Booklet, Coughing and Deep Breathing, Blood Transfusion Information, MRSA Information and Surgical Site Infection Prevention

## 2014-04-29 ENCOUNTER — Encounter (HOSPITAL_COMMUNITY)
Admission: RE | Admit: 2014-04-29 | Discharge: 2014-04-29 | Disposition: A | Discharge status: Home / Self Care | Payer: Medicare Other | Source: Ambulatory Visit | Attending: Orthopedic Surgery | Admitting: Orthopedic Surgery

## 2014-04-29 ENCOUNTER — Encounter (HOSPITAL_COMMUNITY)
Admission: RE | Admit: 2014-04-29 | Discharge: 2014-04-29 | Disposition: A | Discharge status: Home / Self Care | Payer: Medicare Other | Source: Ambulatory Visit | Attending: Orthopaedic Surgery | Admitting: Orthopaedic Surgery

## 2014-04-29 ENCOUNTER — Encounter (HOSPITAL_COMMUNITY): Payer: Self-pay

## 2014-04-29 DIAGNOSIS — Z01818 Encounter for other preprocedural examination: Secondary | ICD-10-CM | POA: Insufficient documentation

## 2014-04-29 DIAGNOSIS — Z01812 Encounter for preprocedural laboratory examination: Secondary | ICD-10-CM | POA: Insufficient documentation

## 2014-04-29 HISTORY — DX: Personal history of other diseases of the respiratory system: Z87.09

## 2014-04-29 HISTORY — DX: Unspecified cataract: H26.9

## 2014-04-29 HISTORY — DX: Pain in unspecified joint: M25.50

## 2014-04-29 HISTORY — DX: Unspecified hemorrhoids: K64.9

## 2014-04-29 LAB — COMPREHENSIVE METABOLIC PANEL
ALK PHOS: 63 U/L (ref 39–117)
ALT: 10 U/L (ref 0–53)
AST: 19 U/L (ref 0–37)
Albumin: 3.7 g/dL (ref 3.5–5.2)
BUN: 11 mg/dL (ref 6–23)
CO2: 25 meq/L (ref 19–32)
Calcium: 9.1 mg/dL (ref 8.4–10.5)
Chloride: 104 mEq/L (ref 96–112)
Creatinine, Ser: 0.97 mg/dL (ref 0.50–1.35)
GFR calc Af Amer: >90 mL/min (ref >90)
GFR calc non Af Amer: 84 mL/min — ABNORMAL LOW (ref >90)
Glucose, Bld: 94 mg/dL (ref 70–99)
Potassium: 4.7 mEq/L (ref 3.7–5.3)
SODIUM: 140 meq/L (ref 137–147)
Total Bilirubin: 0.5 mg/dL (ref 0.3–1.2)
Total Protein: 7 g/dL (ref 6.0–8.3)

## 2014-04-29 LAB — CBC WITH DIFFERENTIAL/PLATELET
Basophils Absolute: 0 10*3/uL (ref 0.0–0.1)
Basophils Relative: 0 % (ref 0–1)
Eosinophils Absolute: 0.3 10*3/uL (ref 0.0–0.7)
Eosinophils Relative: 3 % (ref 0–5)
HCT: 42 % (ref 39.0–52.0)
HEMOGLOBIN: 14.6 g/dL (ref 13.0–17.0)
LYMPHS ABS: 2.5 10*3/uL (ref 0.7–4.0)
LYMPHS PCT: 27 % (ref 12–46)
MCH: 32.2 pg (ref 26.0–34.0)
MCHC: 34.8 g/dL (ref 30.0–36.0)
MCV: 92.7 fL (ref 78.0–100.0)
Monocytes Absolute: 0.5 10*3/uL (ref 0.1–1.0)
Monocytes Relative: 6 % (ref 3–12)
Neutro Abs: 5.9 10*3/uL (ref 1.7–7.7)
Neutrophils Relative %: 64 % (ref 43–77)
PLATELETS: 202 10*3/uL (ref 150–400)
RBC: 4.53 MIL/uL (ref 4.22–5.81)
RDW: 12.5 % (ref 11.5–15.5)
WBC: 9.3 10*3/uL (ref 4.0–10.5)

## 2014-04-29 LAB — URINALYSIS, ROUTINE W REFLEX MICROSCOPIC
Bilirubin Urine: NEGATIVE
GLUCOSE, UA: NEGATIVE mg/dL
HGB URINE DIPSTICK: NEGATIVE
Ketones, ur: NEGATIVE mg/dL
Leukocytes, UA: NEGATIVE
Nitrite: NEGATIVE
Protein, ur: NEGATIVE mg/dL
Specific Gravity, Urine: 1.012 (ref 1.005–1.030)
Urobilinogen, UA: 0.2 mg/dL (ref 0.0–1.0)
pH: 7 (ref 5.0–8.0)

## 2014-04-29 LAB — TYPE AND SCREEN
ABO/RH(D): O POS
Antibody Screen: NEGATIVE

## 2014-04-29 LAB — APTT: aPTT: 30 seconds (ref 24–37)

## 2014-04-29 LAB — PROTIME-INR
INR: 0.99 (ref 0.00–1.49)
Prothrombin Time: 12.9 seconds (ref 11.6–15.2)

## 2014-04-29 LAB — SURGICAL PCR SCREEN
MRSA, PCR: NEGATIVE
Staphylococcus aureus: NEGATIVE

## 2014-04-29 LAB — ABO/RH: ABO/RH(D): O POS

## 2014-04-29 MED ORDER — CHLORHEXIDINE GLUCONATE 4 % EX LIQD: 60.0000 mL | Freq: Once | CUTANEOUS | Status: DC

## 2014-04-29 MED ORDER — CHLORHEXIDINE GLUCONATE 4 % EX LIQD: 60.0000 mL | Freq: Every day | CUTANEOUS | Status: DC

## 2014-04-29 NOTE — Progress Notes (Addendum)
Cardiologist is Dr.Traci Turner with visit under media tab from 04/2013 and most recent visit 04-08-14-clearance note in epic  EKG in epic from 04-08-14  Echo/stress test/heart cath reports in epic from 2012  Denies CXR in past yr  Medical Md is Dr.Dean Alroy Dust in [place]

## 2014-04-29 NOTE — Progress Notes (Signed)
04/29/14 0904  OBSTRUCTIVE SLEEP APNEA  Have you ever been diagnosed with sleep apnea through a sleep study? No  Do you snore loudly (loud enough to be heard through closed doors)?  0  Do you often feel tired, fatigued, or sleepy during the daytime? 0  Has anyone observed you stop breathing during your sleep? 0  Do you have, or are you being treated for high blood pressure? 1  BMI more than 35 kg/m2? 0  Age over 67 years old? 1  Neck circumference greater than 40 cm/16 inches? 1 (18)  Gender: 1  Obstructive Sleep Apnea Score 4  Score 4 or greater  Results sent to PCP

## 2014-04-30 DIAGNOSIS — M1710 Unilateral primary osteoarthritis, unspecified knee: Secondary | ICD-10-CM | POA: Diagnosis not present

## 2014-04-30 LAB — URINE CULTURE
COLONY COUNT: NO GROWTH
Culture: NO GROWTH

## 2014-04-30 NOTE — H&P (Signed)
CHIEF COMPLAINT:  Painful right knee.   HISTORY OF PRESENT ILLNESS:  Christian [name] is a very pleasant 68 year old retired white male who is seen today for evaluation of his right knee.  He has had chronic problems with his right knee for many years.  He has done a lot of walking in his career while working for the post [employer] and certainly started having more problems with his knee.  He is having difficulty with weather changes and with nighttime pain.  He also has occasional weakness and giving way of the knee.  He does use a support sometimes, that of a knee sleeve.  He goes out at times for bike rides for enjoyment.  Recently he has had x-rays revealing bone-on-bone tricompartment OA, more medial than lateral.  He has tried with corticosteroid injections and Hyalgan injections, and his last one was back in March of this year.  There was essentially no improvement.  He now states he has pain at nighttime, pain with every step, and pain with activities of daily living.  At times he has even had to hold onto cabinets because of the pain and instability.  He denies any hip pain or any neurovascular compromise.  Seen today for evaluation.   PAST MEDICAL HISTORY:  In general, his health is fair.   PAST SURGICAL HISTORY/HOSPITALIZATIONS:   Fusion, right great toe.   In 2002 arthroscopic meniscectomy, right knee.   In 2003 right shoulder arthritis.   In 2010 excision of bone spur of the left ankle.   MEDICATIONS:   Isosorbide 30 mg daily,  Crestor 10 mg daily,  lisinopril 10 mg daily,  aspirin 81 mg daily,  fiber 2x per day before meals.   ALLERGIES:  LIPITOR and ZOCOR which give him upper leg pain.   FAMILY HISTORY:  Positive for a mother who has had strokes in the past.  Father has had heart attack and strokes in the past.  There is a sister who has arthritis.   FOURTEEN-POINT SOCIAL HISTORY:  Negative except for glasses and decreased hearing.  He did have chest pain about 3 years ago.  There was a  small occlusion which was felt to be non-stentable, and he has been placed on isosorbide.  He is also on lisinopril and aspirin.  He has had hypertension for 10 years.  All other symptomatology was denied.   SOCIAL HISTORY:  He is a 68 year old white male who is married.  He is retired from the post office.  He quit smoking cigarettes in 1981.  Prior to that he was smoking 2 packs a day for 12 years.  He seldom uses alcohol, that of a beer at a time.   PHYSICAL EXAMINATION:   General:  Reveals a very pleasant 68 year old white male, well developed, well nourished, alert, cooperative, in moderate distress secondary to right knee pain.  Vital Signs:  Height 70 inches.  Weight 278 pounds.  BMI 39.9.                      Temperature 98.8, pulse 72, respirations 16, blood pressure 118/80.   Head:  Normocephalic. Eyes:  Pupils equal, round, and reactive to light and accommodation with extraocular movements intact. Ears/Nose/Throat:  Benign. Chest:  Had good expansion. Lungs:  Essentially clear. Cardiac:  Had a regular rhythm and rate.  Normal S1, S2.  No murmurs noted. Pulses:  Are 1+ bilaterally in the lower extremities. Abdomen:  Obese, soft, nontender.  No masses palpable.  Normal bowel sounds present. CNS:  He is oriented x3, and cranial nerves II through XII grossly intact. Musculoskeletal:  Today he has range of motion from about 4 degrees to 104 degrees.  He does have crepitus with range of motion, 1+ effusion.  No warmth or erythema.  He does have some pseudolaxity with varus and valgus stressing.  Baker cyst is palpable.  Calf is supple and nontender.  Sensation is intact to light touch.  Trace pitting edema, pretibial.   RADIOGRAPHS:  Four-view x-rays reveal bone-on-bone medial compartment OA with tricompartmental osteoarthritis.  This is end stage medially.   CLINICAL IMPRESSION:   1.  End-stage OA, right knee. 2.  Probable coronary artery disease. 3.  Hypertension. 4.  Exogenous  obesity.   RECOMMENDATIONS:  At this time I have reviewed correspondence from Dr. Alroy Dust who feels that from a medical standpoint he is able to consider total knee replacement.  I have also reviewed correspondence from Dr. Fransico Him of cardiology who also feels that he is able to have a total knee replacement from a cardiac standpoint.  Therefore, we feel that he is a candidate for total knee replacement.  I have explained the procedure, risks, and benefits to him in detail.  All questions have been answered.  I discussed his hospital course and his postop rehab.   Mike Craze [place], [place] [phone]  04/30/2014 9:02 PM

## 2014-05-05 MED ORDER — DEXTROSE 5 % IV SOLN
3.0000 g | INTRAVENOUS | Status: AC
  Administered 2014-05-06: 3 g via INTRAVENOUS
  Filled 2014-05-05: qty 3000

## 2014-05-05 MED ORDER — ACETAMINOPHEN 10 MG/ML IV SOLN
1000.0000 mg | Freq: Once | INTRAVENOUS | Status: AC
  Administered 2014-05-06: 1000 mg via INTRAVENOUS

## 2014-05-05 MED ORDER — SODIUM CHLORIDE 0.9 % IV SOLN: INTRAVENOUS | Status: DC

## 2014-05-06 ENCOUNTER — Encounter (HOSPITAL_COMMUNITY): Payer: Medicare Other | Admitting: Anesthesiology

## 2014-05-06 ENCOUNTER — Inpatient Hospital Stay (HOSPITAL_COMMUNITY): Payer: Medicare Other | Admitting: Anesthesiology

## 2014-05-06 ENCOUNTER — Encounter (HOSPITAL_COMMUNITY)
Admission: RE | Disposition: A | Discharge status: Home Health | Payer: Self-pay | Source: Ambulatory Visit | Attending: Orthopaedic Surgery

## 2014-05-06 ENCOUNTER — Encounter (HOSPITAL_COMMUNITY): Payer: Self-pay | Admitting: Anesthesiology

## 2014-05-06 ENCOUNTER — Inpatient Hospital Stay (HOSPITAL_COMMUNITY)
Admission: RE | Admit: 2014-05-06 | Discharge: 2014-05-08 | DRG: 470 | Disposition: A | Discharge status: Home Health | Payer: Medicare Other | Source: Ambulatory Visit | Attending: Orthopaedic Surgery | Admitting: Orthopaedic Surgery

## 2014-05-06 DIAGNOSIS — I10 Essential (primary) hypertension: Secondary | ICD-10-CM | POA: Diagnosis not present

## 2014-05-06 DIAGNOSIS — E669 Obesity, unspecified: Secondary | ICD-10-CM | POA: Diagnosis not present

## 2014-05-06 DIAGNOSIS — D62 Acute posthemorrhagic anemia: Secondary | ICD-10-CM | POA: Diagnosis not present

## 2014-05-06 DIAGNOSIS — I2510 Atherosclerotic heart disease of native coronary artery without angina pectoris: Secondary | ICD-10-CM | POA: Diagnosis not present

## 2014-05-06 DIAGNOSIS — E7800 Pure hypercholesterolemia, unspecified: Secondary | ICD-10-CM | POA: Diagnosis present

## 2014-05-06 DIAGNOSIS — Z96659 Presence of unspecified artificial knee joint: Secondary | ICD-10-CM

## 2014-05-06 DIAGNOSIS — G8918 Other acute postprocedural pain: Secondary | ICD-10-CM | POA: Diagnosis not present

## 2014-05-06 DIAGNOSIS — M1710 Unilateral primary osteoarthritis, unspecified knee: Principal | ICD-10-CM | POA: Diagnosis present

## 2014-05-06 DIAGNOSIS — M179 Osteoarthritis of knee, unspecified: Secondary | ICD-10-CM | POA: Diagnosis present

## 2014-05-06 DIAGNOSIS — M25569 Pain in unspecified knee: Secondary | ICD-10-CM | POA: Diagnosis not present

## 2014-05-06 DIAGNOSIS — Z6838 Body mass index (BMI) 38.0-38.9, adult: Secondary | ICD-10-CM | POA: Diagnosis not present

## 2014-05-06 DIAGNOSIS — Z6839 Body mass index (BMI) 39.0-39.9, adult: Secondary | ICD-10-CM | POA: Diagnosis not present

## 2014-05-06 DIAGNOSIS — Z87891 Personal history of nicotine dependence: Secondary | ICD-10-CM | POA: Diagnosis not present

## 2014-05-06 HISTORY — DX: Cardiac murmur, unspecified: R01.1

## 2014-05-06 HISTORY — PX: TOTAL KNEE ARTHROPLASTY: SHX125

## 2014-05-06 SURGERY — ARTHROPLASTY, KNEE, TOTAL
Anesthesia: General | Site: Knee | Laterality: Right

## 2014-05-06 MED ORDER — BISACODYL 10 MG RE SUPP
10.0000 mg | Freq: Every day | RECTAL | Status: DC | PRN
Start: 2014-05-06 — End: 2014-05-08
  Administered 2014-05-08: 10 mg via RECTAL
  Filled 2014-05-06: qty 1

## 2014-05-06 MED ORDER — NEOSTIGMINE METHYLSULFATE 10 MG/10ML IV SOLN
INTRAVENOUS | Status: DC | PRN
  Administered 2014-05-06: 3 mg via INTRAVENOUS

## 2014-05-06 MED ORDER — LIDOCAINE HCL (CARDIAC) 20 MG/ML IV SOLN
INTRAVENOUS | Status: DC | PRN
  Administered 2014-05-06: 70 mg via INTRAVENOUS

## 2014-05-06 MED ORDER — PHENOL 1.4 % MT LIQD: 1.0000 | OROMUCOSAL | Status: DC | PRN

## 2014-05-06 MED ORDER — SUCCINYLCHOLINE CHLORIDE 20 MG/ML IJ SOLN
INTRAMUSCULAR | Status: AC
  Filled 2014-05-06: qty 1

## 2014-05-06 MED ORDER — SODIUM CHLORIDE 0.9 % IR SOLN
Status: DC | PRN
  Administered 2014-05-06: 3000 mL

## 2014-05-06 MED ORDER — PHENYLEPHRINE 40 MCG/ML (10ML) SYRINGE FOR IV PUSH (FOR BLOOD PRESSURE SUPPORT)
PREFILLED_SYRINGE | INTRAVENOUS | Status: AC
  Filled 2014-05-06: qty 10

## 2014-05-06 MED ORDER — ACETAMINOPHEN 10 MG/ML IV SOLN
INTRAVENOUS | Status: AC
  Filled 2014-05-06: qty 100

## 2014-05-06 MED ORDER — SODIUM CHLORIDE 0.9 % IJ SOLN
INTRAMUSCULAR | Status: AC
  Filled 2014-05-06: qty 10

## 2014-05-06 MED ORDER — EPHEDRINE SULFATE 50 MG/ML IJ SOLN
INTRAMUSCULAR | Status: DC | PRN
  Administered 2014-05-06 (×3): 5 mg via INTRAVENOUS

## 2014-05-06 MED ORDER — MAGNESIUM HYDROXIDE 400 MG/5ML PO SUSP: 30.0000 mL | Freq: Every day | ORAL | Status: DC | PRN

## 2014-05-06 MED ORDER — SODIUM CHLORIDE 0.9 % IV SOLN
75.0000 mL/h | INTRAVENOUS | Status: DC
  Administered 2014-05-06: 75 mL/h via INTRAVENOUS

## 2014-05-06 MED ORDER — 0.9 % SODIUM CHLORIDE (POUR BTL) OPTIME
TOPICAL | Status: DC | PRN
  Administered 2014-05-06: 1000 mL

## 2014-05-06 MED ORDER — FLEET ENEMA 7-19 GM/118ML RE ENEM: 1.0000 | ENEMA | Freq: Once | RECTAL | Status: AC | PRN

## 2014-05-06 MED ORDER — OXYCODONE HCL 5 MG PO TABS
5.0000 mg | ORAL_TABLET | ORAL | Status: DC | PRN
  Administered 2014-05-06 – 2014-05-08 (×10): 10 mg via ORAL
  Filled 2014-05-06 (×9): qty 2

## 2014-05-06 MED ORDER — LISINOPRIL 10 MG PO TABS
10.0000 mg | ORAL_TABLET | Freq: Every day | ORAL | Status: DC
  Administered 2014-05-06 – 2014-05-08 (×3): 10 mg via ORAL
  Filled 2014-05-06 (×3): qty 1

## 2014-05-06 MED ORDER — HYDROMORPHONE HCL PF 1 MG/ML IJ SOLN
INTRAMUSCULAR | Status: AC
  Filled 2014-05-06: qty 1

## 2014-05-06 MED ORDER — CEFAZOLIN SODIUM-DEXTROSE 2-3 GM-% IV SOLR
2.0000 g | Freq: Four times a day (QID) | INTRAVENOUS | Status: AC
  Administered 2014-05-06 (×2): 2 g via INTRAVENOUS
  Filled 2014-05-06 (×3): qty 50

## 2014-05-06 MED ORDER — METHOCARBAMOL 500 MG PO TABS
500.0000 mg | ORAL_TABLET | Freq: Four times a day (QID) | ORAL | Status: DC | PRN
  Administered 2014-05-06 – 2014-05-07 (×4): 500 mg via ORAL
  Filled 2014-05-06 (×3): qty 1

## 2014-05-06 MED ORDER — NEOSTIGMINE METHYLSULFATE 10 MG/10ML IV SOLN
INTRAVENOUS | Status: AC
  Filled 2014-05-06: qty 1

## 2014-05-06 MED ORDER — PROPOFOL 10 MG/ML IV BOLUS
INTRAVENOUS | Status: DC | PRN
  Administered 2014-05-06: 170 mg via INTRAVENOUS

## 2014-05-06 MED ORDER — KETOROLAC TROMETHAMINE 15 MG/ML IJ SOLN
15.0000 mg | Freq: Four times a day (QID) | INTRAMUSCULAR | Status: AC
Start: 2014-05-06 — End: 2014-05-07
  Administered 2014-05-06 – 2014-05-07 (×2): 15 mg via INTRAVENOUS
  Filled 2014-05-06 (×2): qty 1

## 2014-05-06 MED ORDER — FENTANYL CITRATE 0.05 MG/ML IJ SOLN
INTRAMUSCULAR | Status: AC
  Filled 2014-05-06: qty 5

## 2014-05-06 MED ORDER — METOCLOPRAMIDE HCL 5 MG/ML IJ SOLN: 5.0000 mg | Freq: Three times a day (TID) | INTRAMUSCULAR | Status: DC | PRN

## 2014-05-06 MED ORDER — MIDAZOLAM HCL 2 MG/2ML IJ SOLN
INTRAMUSCULAR | Status: AC
  Filled 2014-05-06: qty 2

## 2014-05-06 MED ORDER — LACTATED RINGERS IV SOLN
INTRAVENOUS | Status: DC | PRN
  Administered 2014-05-06 (×2): via INTRAVENOUS

## 2014-05-06 MED ORDER — METOCLOPRAMIDE HCL 10 MG PO TABS: 5.0000 mg | ORAL_TABLET | Freq: Three times a day (TID) | ORAL | Status: DC | PRN

## 2014-05-06 MED ORDER — HYDROMORPHONE HCL PF 1 MG/ML IJ SOLN
1.0000 mg | INTRAMUSCULAR | Status: DC | PRN
  Administered 2014-05-07 (×2): 1 mg via INTRAVENOUS
  Filled 2014-05-06 (×2): qty 1

## 2014-05-06 MED ORDER — ROCURONIUM BROMIDE 50 MG/5ML IV SOLN
INTRAVENOUS | Status: AC
  Filled 2014-05-06: qty 1

## 2014-05-06 MED ORDER — PROPOFOL 10 MG/ML IV BOLUS
INTRAVENOUS | Status: AC
  Filled 2014-05-06: qty 20

## 2014-05-06 MED ORDER — DOCUSATE SODIUM 100 MG PO CAPS
100.0000 mg | ORAL_CAPSULE | Freq: Two times a day (BID) | ORAL | Status: DC
  Administered 2014-05-06 – 2014-05-08 (×5): 100 mg via ORAL
  Filled 2014-05-06 (×4): qty 1

## 2014-05-06 MED ORDER — GLYCOPYRROLATE 0.2 MG/ML IJ SOLN
INTRAMUSCULAR | Status: AC
  Filled 2014-05-06: qty 2

## 2014-05-06 MED ORDER — BUPIVACAINE-EPINEPHRINE (PF) 0.25% -1:200000 IJ SOLN
INTRAMUSCULAR | Status: AC
  Filled 2014-05-06: qty 30

## 2014-05-06 MED ORDER — ROCURONIUM BROMIDE 100 MG/10ML IV SOLN
INTRAVENOUS | Status: DC | PRN
  Administered 2014-05-06: 50 mg via INTRAVENOUS

## 2014-05-06 MED ORDER — NITROGLYCERIN 0.4 MG SL SUBL: 0.4000 mg | SUBLINGUAL_TABLET | SUBLINGUAL | Status: DC | PRN

## 2014-05-06 MED ORDER — ONDANSETRON HCL 4 MG/2ML IJ SOLN
INTRAMUSCULAR | Status: AC
  Filled 2014-05-06: qty 2

## 2014-05-06 MED ORDER — EPHEDRINE SULFATE 50 MG/ML IJ SOLN
INTRAMUSCULAR | Status: AC
  Filled 2014-05-06: qty 1

## 2014-05-06 MED ORDER — HYDROMORPHONE HCL PF 1 MG/ML IJ SOLN
INTRAMUSCULAR | Status: AC
  Administered 2014-05-06: 0.5 mg via INTRAVENOUS
  Filled 2014-05-06: qty 1

## 2014-05-06 MED ORDER — METHOCARBAMOL 500 MG PO TABS
ORAL_TABLET | ORAL | Status: AC
Start: 2014-05-06 — End: 2014-05-06
  Filled 2014-05-06: qty 1

## 2014-05-06 MED ORDER — HYDROMORPHONE HCL PF 1 MG/ML IJ SOLN
0.2500 mg | INTRAMUSCULAR | Status: DC | PRN
  Administered 2014-05-06: 0.25 mg via INTRAVENOUS
  Administered 2014-05-06 (×2): 0.5 mg via INTRAVENOUS

## 2014-05-06 MED ORDER — ALUM & MAG HYDROXIDE-SIMETH 200-200-20 MG/5ML PO SUSP: 30.0000 mL | ORAL | Status: DC | PRN

## 2014-05-06 MED ORDER — KETOROLAC TROMETHAMINE 30 MG/ML IJ SOLN
INTRAMUSCULAR | Status: AC
  Administered 2014-05-06: 15 mg
  Filled 2014-05-06: qty 1

## 2014-05-06 MED ORDER — METHOCARBAMOL 1000 MG/10ML IJ SOLN: 500.0000 mg | Freq: Four times a day (QID) | INTRAVENOUS | Status: DC | PRN

## 2014-05-06 MED ORDER — MENTHOL 3 MG MT LOZG: 1.0000 | LOZENGE | OROMUCOSAL | Status: DC | PRN

## 2014-05-06 MED ORDER — ISOSORBIDE MONONITRATE ER 30 MG PO TB24
30.0000 mg | ORAL_TABLET | Freq: Every day | ORAL | Status: DC
  Administered 2014-05-06 – 2014-05-08 (×3): 30 mg via ORAL
  Filled 2014-05-06 (×3): qty 1

## 2014-05-06 MED ORDER — OXYCODONE HCL 5 MG PO TABS
ORAL_TABLET | ORAL | Status: AC
  Filled 2014-05-06: qty 2

## 2014-05-06 MED ORDER — ONDANSETRON HCL 4 MG/2ML IJ SOLN
4.0000 mg | Freq: Four times a day (QID) | INTRAMUSCULAR | Status: DC | PRN
Start: 2014-05-06 — End: 2014-05-08

## 2014-05-06 MED ORDER — ACETAMINOPHEN 10 MG/ML IV SOLN
1000.0000 mg | Freq: Four times a day (QID) | INTRAVENOUS | Status: DC
  Administered 2014-05-06: 1000 mg via INTRAVENOUS
  Filled 2014-05-06 (×3): qty 100

## 2014-05-06 MED ORDER — ONDANSETRON HCL 4 MG/2ML IJ SOLN
INTRAMUSCULAR | Status: DC | PRN
  Administered 2014-05-06: 4 mg via INTRAVENOUS

## 2014-05-06 MED ORDER — RIVAROXABAN 10 MG PO TABS
10.0000 mg | ORAL_TABLET | ORAL | Status: DC
  Administered 2014-05-06 – 2014-05-07 (×2): 10 mg via ORAL
  Filled 2014-05-06 (×3): qty 1

## 2014-05-06 MED ORDER — BUPIVACAINE-EPINEPHRINE 0.25% -1:200000 IJ SOLN
INTRAMUSCULAR | Status: DC | PRN
  Administered 2014-05-06: 30 mL

## 2014-05-06 MED ORDER — ONDANSETRON HCL 4 MG PO TABS
4.0000 mg | ORAL_TABLET | Freq: Four times a day (QID) | ORAL | Status: DC | PRN
Start: 2014-05-06 — End: 2014-05-08

## 2014-05-06 MED ORDER — MIDAZOLAM HCL 5 MG/5ML IJ SOLN
INTRAMUSCULAR | Status: DC | PRN
  Administered 2014-05-06: 2 mg via INTRAVENOUS

## 2014-05-06 MED ORDER — ACETAMINOPHEN 500 MG PO TABS
1000.0000 mg | ORAL_TABLET | Freq: Four times a day (QID) | ORAL | Status: AC
  Administered 2014-05-06 – 2014-05-07 (×4): 1000 mg via ORAL
  Filled 2014-05-06 (×4): qty 2

## 2014-05-06 MED ORDER — GLYCOPYRROLATE 0.2 MG/ML IJ SOLN
INTRAMUSCULAR | Status: AC
  Filled 2014-05-06: qty 1

## 2014-05-06 MED ORDER — GLYCOPYRROLATE 0.2 MG/ML IJ SOLN
INTRAMUSCULAR | Status: DC | PRN
  Administered 2014-05-06: 0.4 mg via INTRAVENOUS

## 2014-05-06 MED ORDER — LIDOCAINE HCL (CARDIAC) 20 MG/ML IV SOLN
INTRAVENOUS | Status: AC
  Filled 2014-05-06: qty 5

## 2014-05-06 MED ORDER — FENTANYL CITRATE 0.05 MG/ML IJ SOLN
INTRAMUSCULAR | Status: DC | PRN
  Administered 2014-05-06: 75 ug via INTRAVENOUS
  Administered 2014-05-06 (×5): 50 ug via INTRAVENOUS
  Administered 2014-05-06: 25 ug via INTRAVENOUS
  Administered 2014-05-06: 50 ug via INTRAVENOUS

## 2014-05-06 MED ORDER — KETOROLAC TROMETHAMINE 15 MG/ML IJ SOLN: 15.0000 mg | Freq: Four times a day (QID) | INTRAMUSCULAR | Status: DC

## 2014-05-06 SURGICAL SUPPLY — 61 items
BANDAGE ESMARK 6X9 LF (GAUZE/BANDAGES/DRESSINGS) ×1 IMPLANT
BLADE 10 SAFETY STRL DISP (BLADE) ×2 IMPLANT
BLADE SAGITTAL 25.0X1.19X90 (BLADE) ×2 IMPLANT
BNDG CMPR 9X6 STRL LF SNTH (GAUZE/BANDAGES/DRESSINGS) ×1
BNDG ESMARK 6X9 LF (GAUZE/BANDAGES/DRESSINGS) ×2
BOWL SMART MIX CTS (DISPOSABLE) ×2 IMPLANT
CAPT RP KNEE ×1 IMPLANT
CEMENT HV SMART SET (Cement) ×4 IMPLANT
COVER BACK TABLE 24X17X13 BIG (DRAPES) ×1 IMPLANT
COVER SURGICAL LIGHT HANDLE (MISCELLANEOUS) ×2 IMPLANT
CUFF TOURNIQUET SINGLE 34IN LL (TOURNIQUET CUFF) ×1 IMPLANT
CUFF TOURNIQUET SINGLE 44IN (TOURNIQUET CUFF) IMPLANT
DRAPE EXTREMITY T 121X128X90 (DRAPE) ×2 IMPLANT
DRAPE PROXIMA HALF (DRAPES) ×2 IMPLANT
DRSG ADAPTIC 3X8 NADH LF (GAUZE/BANDAGES/DRESSINGS) ×2 IMPLANT
DRSG PAD ABDOMINAL 8X10 ST (GAUZE/BANDAGES/DRESSINGS) ×3 IMPLANT
DURAPREP 26ML APPLICATOR (WOUND CARE) ×2 IMPLANT
ELECT CAUTERY BLADE 6.4 (BLADE) ×2 IMPLANT
ELECT REM PT RETURN 9FT ADLT (ELECTROSURGICAL) ×2
ELECTRODE REM PT RTRN 9FT ADLT (ELECTROSURGICAL) ×1 IMPLANT
EVACUATOR 1/8 PVC DRAIN (DRAIN) ×1 IMPLANT
FACESHIELD WRAPAROUND (MASK) ×4 IMPLANT
FACESHIELD WRAPAROUND OR TEAM (MASK) ×2 IMPLANT
FLOSEAL 10ML (HEMOSTASIS) IMPLANT
GLOVE BIOGEL PI IND STRL 8 (GLOVE) ×1 IMPLANT
GLOVE BIOGEL PI IND STRL 8.5 (GLOVE) ×1 IMPLANT
GLOVE BIOGEL PI INDICATOR 8 (GLOVE) ×1
GLOVE BIOGEL PI INDICATOR 8.5 (GLOVE) ×1
GLOVE ECLIPSE 8.0 STRL XLNG CF (GLOVE) ×4 IMPLANT
GLOVE SURG ORTHO 8.5 STRL (GLOVE) ×4 IMPLANT
GOWN STRL REUS W/ TWL LRG LVL3 (GOWN DISPOSABLE) ×2 IMPLANT
GOWN STRL REUS W/TWL 2XL LVL3 (GOWN DISPOSABLE) ×2 IMPLANT
GOWN STRL REUS W/TWL LRG LVL3 (GOWN DISPOSABLE) ×4
HANDPIECE INTERPULSE COAX TIP (DISPOSABLE) ×2
KIT BASIN OR (CUSTOM PROCEDURE TRAY) ×2 IMPLANT
KIT ROOM TURNOVER OR (KITS) ×2 IMPLANT
MANIFOLD NEPTUNE II (INSTRUMENTS) ×2 IMPLANT
NEEDLE 22X1 1/2 (OR ONLY) (NEEDLE) ×1 IMPLANT
NS IRRIG 1000ML POUR BTL (IV SOLUTION) ×2 IMPLANT
PACK TOTAL JOINT (CUSTOM PROCEDURE TRAY) ×2 IMPLANT
PAD ARMBOARD 7.5X6 YLW CONV (MISCELLANEOUS) ×4 IMPLANT
PAD CAST 4YDX4 CTTN HI CHSV (CAST SUPPLIES) ×1 IMPLANT
PADDING CAST COTTON 4X4 STRL (CAST SUPPLIES) ×2
PADDING CAST COTTON 6X4 STRL (CAST SUPPLIES) ×2 IMPLANT
SET HNDPC FAN SPRY TIP SCT (DISPOSABLE) ×1 IMPLANT
SPONGE GAUZE 4X4 12PLY (GAUZE/BANDAGES/DRESSINGS) ×2 IMPLANT
STAPLER VISISTAT 35W (STAPLE) ×2 IMPLANT
SUCTION FRAZIER TIP 10 FR DISP (SUCTIONS) ×2 IMPLANT
SUT BONE WAX W31G (SUTURE) ×2 IMPLANT
SUT ETHIBOND NAB CT1 #1 30IN (SUTURE) ×7 IMPLANT
SUT MNCRL AB 3-0 PS2 18 (SUTURE) ×2 IMPLANT
SUT VIC AB 0 CT1 27 (SUTURE) ×2
SUT VIC AB 0 CT1 27XBRD ANBCTR (SUTURE) ×1 IMPLANT
SUT VIC AB 1 CT1 27 (SUTURE) ×2
SUT VIC AB 1 CT1 27XBRD ANBCTR (SUTURE) ×1 IMPLANT
SYR CONTROL 10ML LL (SYRINGE) IMPLANT
TOWEL OR 17X24 6PK STRL BLUE (TOWEL DISPOSABLE) ×2 IMPLANT
TOWEL OR 17X26 10 PK STRL BLUE (TOWEL DISPOSABLE) ×2 IMPLANT
TRAY FOLEY CATH 16FRSI W/METER (SET/KITS/TRAYS/PACK) IMPLANT
WATER STERILE IRR 1000ML POUR (IV SOLUTION) ×2 IMPLANT
WRAP KNEE MAXI GEL POST OP (GAUZE/BANDAGES/DRESSINGS) ×2 IMPLANT

## 2014-05-06 NOTE — Transfer of Care (Signed)
Immediate Anesthesia Transfer of Care Note  Patient: Christian [name]  Procedure(s) Performed: Procedure(s): RIGHT TOTAL KNEE ARTHROPLASTY (Right)  Patient Location: PACU  Anesthesia Type:General  Level of Consciousness: awake, oriented and patient cooperative  Airway & Oxygen Therapy: Patient Spontanous Breathing and Patient connected to nasal cannula oxygen  Post-op Assessment: Report given to PACU RN and Post -op Vital signs reviewed and stable  Post vital signs: Reviewed  Complications: No apparent anesthesia complications

## 2014-05-06 NOTE — Progress Notes (Signed)
Care of pt assumed by [facility] RN from [place]. Lovena Le RN

## 2014-05-06 NOTE — Progress Notes (Signed)
Orthopedic Tech Progress Note Patient Details:  Christian [name] 17-Apr-1946 [id number] On cpm at 8:05 pm RLE 0-60 Patient ID: Christian [name], male   DOB: [birth year], 68 y.o.   MRN: [id number]   Braulio Bosch 05/06/2014, 8:06 PM

## 2014-05-06 NOTE — H&P (Signed)
  The recent History & Physical has been reviewed. I have personally examined the patient today. There is no interval change to the documented History & Physical. The patient would like to proceed with the procedure.  Garald Balding 05/06/2014,  7:13 AM

## 2014-05-06 NOTE — Progress Notes (Signed)
Orthopedic Tech Progress Note Patient Details:  Christian [name] November 29, 1946 [id number]  CPM Right Knee Right Knee Flexion (Degrees): 50 Right Knee Extension (Degrees): 0 Additional Comments: right knee post op TKA. patient was uncomfortable at 60^ nurse changed to 50^ to start.   Ashok Cordia 05/06/2014, 1:34 PM

## 2014-05-06 NOTE — Anesthesia Preprocedure Evaluation (Addendum)
Anesthesia Evaluation  Patient identified by MRN, date of birth, ID band Patient awake    Reviewed: Allergy & Precautions, H&P , NPO status , Patient's Chart, lab work & pertinent test results  History of Anesthesia Complications Negative for: history of anesthetic complications  Airway Mallampati: II TM Distance: >3 FB Neck ROM: Full    Dental  (+) Teeth Intact, Dental Advisory Given   Pulmonary former smoker,  breath sounds clear to auscultation        Cardiovascular hypertension, Pt. on medications + CAD (80% diag) Rhythm:Regular  '12 ECHO: EF 69%, valves OK '12 stress: EF 69%, low risk   Neuro/Psych negative psych ROS   GI/Hepatic negative GI ROS, Neg liver ROS,   Endo/Other  Morbid obesity  Renal/GU negative Renal ROS     Musculoskeletal   Abdominal   Peds  Hematology   Anesthesia Other Findings   Reproductive/Obstetrics negative OB ROS                      Anesthesia Physical Anesthesia Plan  ASA: III  Anesthesia Plan: General   Post-op Pain Management:    Induction: Intravenous  Airway Management Planned: Oral ETT  Additional Equipment:   Intra-op Plan:   Post-operative Plan: Extubation in OR  Informed Consent: I have reviewed the patients History and Physical, chart, labs and discussed the procedure including the risks, benefits and alternatives for the proposed anesthesia with the patient or authorized representative who has indicated his/her understanding and acceptance.   Dental advisory given  Plan Discussed with: CRNA, Anesthesiologist and Surgeon  Anesthesia Plan Comments:        Anesthesia Quick Evaluation

## 2014-05-06 NOTE — Anesthesia Postprocedure Evaluation (Signed)
  Anesthesia Post-op Note  Patient: Christian [name]  Procedure(s) Performed: Procedure(s): RIGHT TOTAL KNEE ARTHROPLASTY (Right)  Patient Location: PACU  Anesthesia Type:General  Level of Consciousness: awake  Airway and Oxygen Therapy: Patient Spontanous Breathing  Post-op Pain: mild   Post-op Assessment: Post-op Vital signs reviewed  Post-op Vital Signs: Reviewed  Last Vitals:  Filed Vitals:   05/06/14 1501  BP: 133/73  Pulse: 62  Temp: 36.7 C  Resp: 18    Complications: No apparent anesthesia complications

## 2014-05-06 NOTE — Evaluation (Signed)
Physical Therapy Evaluation Patient Details Name: Christian [name] MRN: [id number] DOB: [birth year] Today's Date: 05/06/2014   History of Present Illness  68 y.o. male admitted to [facility] on 05/06/14 for elective R TKA.  He is PWB 50%.  He has significant PMHx of orthostatic hypotension, sciatica, CAD, HTN, multiple R shoulder surgeries, L achilles' tendon repair.   Clinical Impression  Pt is POD #0 and is moving well. He is able to maintain PWB 50% with the assist of his upper extremities and the RW.  I anticipate he will progress well enough to return home with his wife and HHPT f/u at d/c.   PT to follow acutely for deficits listed below.       Follow Up Recommendations Home health PT    Equipment Recommendations  None recommended by PT    Recommendations for Other Services   NA    Precautions / Restrictions Precautions Precautions: Knee Precaution Comments: reviewed no pillow under right knee and PWB 50% Restrictions Weight Bearing Restrictions: Yes RLE Weight Bearing: Partial weight bearing RLE Partial Weight Bearing Percentage or Pounds: 50%      Mobility  Bed Mobility Overal bed mobility: Needs Assistance Bed Mobility: Supine to Sit     Supine to sit: Min assist     General bed mobility comments: min assist to support right leg to get to sitting EOB.   Transfers Overall transfer level: Needs assistance Equipment used: Rolling walker (2 wheeled) Transfers: Sit to/from Stand Sit to Stand: Min assist         General transfer comment: Min assist to support trunk for balance during transitions.  Ambulation/Gait Ambulation/Gait assistance: Min guard Ambulation Distance (Feet): 8 Feet Assistive device: Rolling walker (2 wheeled) Gait Pattern/deviations: Step-to pattern;Antalgic Gait velocity: Min guard assist to support trunk during gait.  Verbal cues for correct leg sequencing.    General Gait Details: some very mild buckling with WB right leg, but pt able to  compensate with arms.        Balance Overall balance assessment: Needs assistance Sitting-balance support: No upper extremity supported;Feet supported Sitting balance-[name]: Good     Standing balance support: Bilateral upper extremity supported Standing balance-[name]: Fair                               Pertinent Vitals/Pain See vitals flow sheet.     Home Living Family/patient expects to be discharged to:: Private residence Living Arrangements: Spouse/significant other Available Help at Discharge: Family;Available 24 hours/day Type of Home: House Home Access: Stairs to enter Entrance Stairs-Rails: Right;Left;Can reach both Entrance Stairs-Number of Steps: 4 Home Layout: Two level Home Equipment: Walker - 2 wheels;Cane - single point      Prior Function Level of Independence: Independent         Comments: per pt he was not using and assistive device to walk "just hobbling around"     Hand Dominance        Extremity/Trunk Assessment   Upper Extremity Assessment: Overall WFL for tasks assessed (h/o R shoulder surgeries x 2)           Lower Extremity Assessment: RLE deficits/detail RLE Deficits / Details: right leg with normal post-iop pain and weakness.  Some mild buckling in stance on right leg, but pt able to compensate with his arms.     Cervical / Trunk Assessment: Normal  Communication   Communication: No difficulties  Cognition Arousal/Alertness: Awake/alert  Behavior During Therapy: WFL for tasks assessed/performed Overall Cognitive Status: Within Functional Limits for tasks assessed                         Exercises Total Joint Exercises Ankle Circles/Pumps: AROM;Both;20 reps;Seated      Assessment/Plan    PT Assessment Patient needs continued PT services  PT Diagnosis Difficulty walking;Abnormality of gait;Generalized weakness;Acute pain   PT Problem List Decreased strength;Decreased range of  motion;Decreased activity tolerance;Decreased balance;Decreased mobility;Decreased knowledge of use of DME;Decreased knowledge of precautions;Pain  PT Treatment Interventions DME instruction;Gait training;Stair training;Functional mobility training;Therapeutic activities;Therapeutic exercise;Balance training;Neuromuscular re-education;Cognitive remediation;Patient/family education;Modalities   PT Goals (Current goals can be found in the Care Plan section) Acute Rehab PT Goals Patient Stated Goal: to go home, be safe PT Goal Formulation: With patient Time For Goal Achievement: 05/13/14 Potential to Achieve Goals: Good    Frequency 7X/week    End of Session Equipment Utilized During Treatment: Gait belt Activity Tolerance: Patient tolerated treatment well Patient left: in chair;with call bell/phone within reach;with family/visitor present Nurse Communication: Mobility status;Other (comment) (took O2 off sats in Mid 90s on RA)         Time: 6720-9470 PT Time Calculation (min): 26 min   Charges:   PT Evaluation $Initial PT Evaluation Tier I: 1 Procedure PT Treatments $Therapeutic Activity: 8-22 mins     Christian [name] B. [place], [place], DPT [phone]   05/06/2014, 5:13 PM

## 2014-05-06 NOTE — Progress Notes (Signed)
Utilization review completed.  

## 2014-05-06 NOTE — Progress Notes (Signed)
Report to L. Reynolds Therapist, sports as primary caregiver. (Pt moved to Phase 2 area of PACU since still no 5N beds)

## 2014-05-06 NOTE — Op Note (Signed)
PATIENT ID:      Christian [name]  MRN:     [id number] DOB/AGE:    1946-04-30 / 68 y.o.       OPERATIVE REPORT    DATE OF PROCEDURE:  05/06/2014       PREOPERATIVE DIAGNOSIS:   RIGHT KNEE OSTEOARTHRITIS-END STAGE                                                       Estimated body mass index is 39.49 kg/(m^2) as calculated from the following:   Height as of 04/29/14: 5\' 11"  (1.803 m).   Weight as of 04/08/14: 128.368 kg (283 lb).     POSTOPERATIVE DIAGNOSIS:   RIGHT KNEE OSTEOARTHRITIS-SAME                                                                    Estimated body mass index is 39.49 kg/(m^2) as calculated from the following:   Height as of 04/29/14: 5\' 11"  (1.803 m).   Weight as of 04/08/14: 128.368 kg (283 lb).     PROCEDURE:  Procedure(s): RIGHT TOTAL KNEE ARTHROPLASTY right     SURGEON:  [name], MD    ASSISTANT:   Biagio Borg, PA-C   (Present and scrubbed throughout the case, critical for assistance with exposure, retraction, instrumentation, and closure.)          ANESTHESIA: regional and general     DRAINS: (right knee) Hemovact drain(s) in the clamped with  Suction Clamped :      TOURNIQUET TIME:  Total Tourniquet Time Documented: Thigh (Right) - 79 minutes Total: Thigh (Right) - 79 minutes     COMPLICATIONS:  None   CONDITION:  stable  PROCEDURE IN DETAIL: [place] 05/06/2014, 9:30 AM

## 2014-05-06 NOTE — Progress Notes (Signed)
Orthopedic Tech Progress Note Patient Details:  Christian [name] [birth year] [id number]   overhead frame applied to patients bed per orders. Patient is educated on the use of the frame and will contact the nursing staff with any further questions or concerns.    Ashok Cordia 05/06/2014, 4:12 PM

## 2014-05-06 NOTE — Anesthesia Procedure Notes (Addendum)
Procedure Name: Intubation Date/Time: 05/06/2014 7:39 AM Performed by: Jenne Campus Pre-anesthesia Checklist: Emergency Drugs available, Patient identified, Suction available, Patient being monitored and Timeout performed Patient Re-evaluated:Patient Re-evaluated prior to inductionOxygen Delivery Method: Circle system utilized Preoxygenation: Pre-oxygenation with 100% oxygen Intubation Type: IV induction Ventilation: Mask ventilation without difficulty and Oral airway inserted - appropriate to patient size Laryngoscope Size: Miller and 2 Grade View: Grade II Tube type: Oral Nasal Tubes: Right Tube size: 7.5 mm Number of attempts: 1 Airway Equipment and Method: Stylet Placement Confirmation: ETT inserted through vocal cords under direct vision,  positive ETCO2,  CO2 detector and breath sounds checked- equal and bilateral Secured at: 22 cm Tube secured with: Tape Dental Injury: Teeth and Oropharynx as per pre-operative assessment     Anesthesia Regional Block:  Femoral nerve block  Pre-Anesthetic Checklist: ,, timeout performed, Correct Patient, Correct Site, Correct Laterality, Correct Procedure, Correct Position, site marked, Risks and benefits discussed,  Surgical consent,  Pre-op evaluation,  At surgeon's request and post-op pain management  Laterality: Right  Prep: chloraprep       Needles:   Needle Type: Echogenic Stimulator Needle          Additional Needles:  Procedures: Doppler guided and nerve stimulator Femoral nerve block Narrative:  Start time: 05/06/2014 7:00 AM End time: 05/06/2014 7:15 AM Injection made incrementally with aspirations every 5 mL.  Performed by: Personally  Anesthesiologist: Dr. Oletta Lamas

## 2014-05-07 ENCOUNTER — Encounter (HOSPITAL_COMMUNITY): Payer: Self-pay | Admitting: Orthopaedic Surgery

## 2014-05-07 LAB — BASIC METABOLIC PANEL
BUN: 15 mg/dL (ref 6–23)
CO2: 24 mEq/L (ref 19–32)
Calcium: 7.7 mg/dL — ABNORMAL LOW (ref 8.4–10.5)
Chloride: 103 mEq/L (ref 96–112)
Creatinine, Ser: 1.17 mg/dL (ref 0.50–1.35)
GFR calc non Af Amer: 63 mL/min — ABNORMAL LOW (ref >90)
GFR, EST AFRICAN AMERICAN: 73 mL/min — AB (ref >90)
Glucose, Bld: 111 mg/dL — ABNORMAL HIGH (ref 70–99)
Potassium: 4.1 mEq/L (ref 3.7–5.3)
SODIUM: 137 meq/L (ref 137–147)

## 2014-05-07 LAB — CBC
HCT: 30.8 % — ABNORMAL LOW (ref 39.0–52.0)
Hemoglobin: 10.4 g/dL — ABNORMAL LOW (ref 13.0–17.0)
MCH: 31.6 pg (ref 26.0–34.0)
MCHC: 33.8 g/dL (ref 30.0–36.0)
MCV: 93.6 fL (ref 78.0–100.0)
PLATELETS: 156 10*3/uL (ref 150–400)
RBC: 3.29 MIL/uL — AB (ref 4.22–5.81)
RDW: 12.7 % (ref 11.5–15.5)
WBC: 8.4 10*3/uL (ref 4.0–10.5)

## 2014-05-07 NOTE — Progress Notes (Signed)
Patient ID: Christian [name], male   DOB: 12/06/1946, 68 y.o.   MRN: [id number] PATIENT ID: Christian [name]        MRN:  [id number]          DOB/AGE: Dec 26, 1946 / 68 y.o.    Christian Fears, MD   Christian Borg, PA-C [address] [place], [place]  [address]                             [phone]   PROGRESS NOTE  Subjective:  negative for Chest Pain  negative for Shortness of Breath  negative for Nausea/Vomiting   negative for Calf Pain    Tolerating Diet: yes         Patient reports pain as mild.     --good night with minimal pain  Objective: Vital signs in last 24 hours:   Patient Vitals for the past 24 hrs:  BP Temp Temp src Pulse Resp SpO2 Height Weight  05/07/14 0552 117/56 mmHg 99.2 F (37.3 C) Oral 62 18 98 % - -  05/07/14 0325 - - - - 14 98 % - -  05/07/14 0146 110/52 mmHg 99.9 F (37.7 C) Oral 62 18 98 % - -  05/07/14 0100 - - - - - - 5\' 11"  (1.803 m) 125.646 kg (277 lb)  05/07/14 0000 - - - - 16 96 % - -  05/06/14 2045 127/61 mmHg 98.3 F (36.8 C) Oral 59 18 97 % - -  05/06/14 2000 - - - - 18 95 % - -  05/06/14 1703 - - - - - 96 % - -  05/06/14 1621 123/62 mmHg - - - - - - -  05/06/14 1501 133/73 mmHg 98.1 F (36.7 C) - 62 18 100 % - -  05/06/14 1415 117/72 mmHg 98.4 F (36.9 C) - 78 17 96 % - -  05/06/14 1245 123/72 mmHg - - 56 13 98 % - -  05/06/14 1145 113/75 mmHg - - 60 15 96 % - -  05/06/14 1130 122/69 mmHg - - 55 15 93 % - -  05/06/14 1115 131/76 mmHg - - 57 13 100 % - -  05/06/14 1100 135/77 mmHg - - 56 11 97 % - -  05/06/14 1045 132/74 mmHg - - 59 9 98 % - -  05/06/14 1036 145/75 mmHg - - 58 10 99 % - -  05/06/14 1015 140/69 mmHg - - 66 - - - -  05/06/14 0956 138/73 mmHg 98.2 F (36.8 C) - 76 14 97 % - -      Intake/Output from previous day:   05/12 0701 - 05/13 0700 In: 2150 [P.O.:150; I.V.:2000] Out: 1025 [Urine:500; Drains:525]   Intake/Output this shift:       Intake/Output     05/12 0701 - 05/13 0700 05/13 0701 - 05/14 0700   P.O. 150    I.V. (mL/kg) 2000 (15.9)    Total Intake(mL/kg) 2150 (17.1)    Urine (mL/kg/hr) 500 (0.2)    Drains 525 (0.2)    Total Output 1025     Net +1125             LABORATORY DATA:  Recent Labs  05/07/14 0436  WBC 8.4  HGB 10.4*  HCT 30.8*  PLT 156    Recent Labs  05/07/14 0436  NA 137  K 4.1  CL 103  CO2 24  BUN 15  CREATININE 1.17  GLUCOSE 111*  CALCIUM 7.7*   Lab Results  Component Value Date   INR 0.99 04/29/2014   INR 0.96 01/07/2010    Recent Radiographic Studies :  Chest 2 View  04/29/2014   CLINICAL DATA:  Preoperative respiratory examination for knee surgery.  EXAM: CHEST  2 VIEW  COMPARISON:  None.  FINDINGS: The cardiomediastinal silhouette is unremarkable.  The lungs are clear.  There is no evidence of focal airspace disease, pulmonary edema, suspicious pulmonary nodule/mass, pleural effusion, or pneumothorax.  No acute bony abnormalities are identified.  IMPRESSION: No active cardiopulmonary disease.   Electronically Signed   By: Hassan Rowan M.D.   On: 04/29/2014 10:03     Examination:  General appearance: alert, cooperative and no distress  Wound Exam: clean, dry, intact   Drainage:  Moderate amount Serosanguinous exudate in hemovac-150cc's in last 12 hrs  Motor Exam: EHL, FHL, Anterior Tibial and Posterior Tibial Intact   Sensory Exam: Superficial Peroneal, Deep Peroneal and Tibial normal  Vascular Exam: Normal  Assessment:    1 Day Post-Op  Procedure(s) (LRB): RIGHT TOTAL KNEE ARTHROPLASTY (Right)  ADDITIONAL DIAGNOSIS:  Active Problems:   S/P total knee replacement using cement  Acute Blood Loss Anemia-asymptomatic   Plan: Physical Therapy as ordered Partial Weight Bearing @ 50% (PWB)  DVT Prophylaxis:  Xarelto  DISCHARGE PLAN: Home  DISCHARGE NEEDS: HHPT, CPM, Walker and 3-in-1 comode seat   OOB with PT,saline lock IV, D/C hemovac in am- hope for D/C tomorrow      Christian [name] 05/07/2014 7:47 AM

## 2014-05-07 NOTE — Progress Notes (Signed)
Physical Therapy Treatment Patient Details Name: Christian [name] MRN: [id number] DOB: 31-Jan-1946 Today's Date: 05/07/2014    History of Present Illness 68 y.o. male admitted to [facility] on 05/06/14 for elective R TKA.  He is PWB 50%.  He has significant PMHx of orthostatic hypotension, sciatica, CAD, HTN, multiple R shoulder surgeries, L achilles' tendon repair.     PT Comments    Patient continuing to make good progress. On track for DC home tomorrow. WIll need to practice steps tomorrow morning.   Follow Up Recommendations  Home health PT     Equipment Recommendations  None recommended by PT    Recommendations for Other Services       Precautions / Restrictions Precautions Precautions: Knee Precaution Comments: reviewed no pillow under right knee and PWB 50% Restrictions RLE Weight Bearing: Partial weight bearing RLE Partial Weight Bearing Percentage or Pounds: 50    Mobility  Bed Mobility   Bed Mobility: Sit to Supine       Sit to supine: Supervision   General bed mobility comments: Patient able to lift BLE back into bed with some increased effort  Transfers Overall transfer level: Needs assistance Equipment used: Rolling walker (2 wheeled) Transfers: Sit to/from Stand Sit to Stand: Min guard         General transfer comment: [place] back into recliner from partial stand but able to regroup and complete stand without physical assist  Ambulation/Gait Ambulation/Gait assistance: Min guard Ambulation Distance (Feet): 120 Feet Assistive device: Rolling walker (2 wheeled) Gait Pattern/deviations: Step-to pattern     General Gait Details: Patient with better return of heel strike this session. Cues to look forward   Stairs            Wheelchair Mobility    Modified Rankin (Stroke Patients Only)       Balance                                    Cognition Arousal/Alertness: Awake/alert Behavior During Therapy: WFL for tasks  assessed/performed Overall Cognitive Status: Within Functional Limits for tasks assessed                      Exercises Total Joint Exercises Quad Sets: AROM;Right;10 reps Heel Slides: AAROM;Right;10 reps Hip ABduction/ADduction: AAROM;Right;10 reps Straight Leg Raises: AAROM;Right;10 reps Long Arc Quad: AAROM;Right;10 reps    General Comments        Pertinent Vitals/Pain no apparent distress     Home Living                      Prior Function            PT Goals (current goals can now be found in the care plan section) Progress towards PT goals: Progressing toward goals    Frequency  7X/week    PT Plan Current plan remains appropriate    Co-evaluation             End of Session Equipment Utilized During Treatment: Gait belt Activity Tolerance: Patient tolerated treatment well Patient left: with call bell/phone within reach;with family/visitor present;in bed;in CPM     Time: 1404-1430 PT Time Calculation (min): 26 min  Charges:  $Gait Training: 8-22 mins $Therapeutic Exercise: 8-22 mins                    G Codes:  Tonia Brooms [place] 05/07/2014, 2:36 PM 05/07/2014 [place] PTA [phone] pager [phone] office

## 2014-05-07 NOTE — Progress Notes (Signed)
Physical Therapy Treatment Patient Details Name: Christian [name] MRN: [id number] DOB: 20-Jul-1946 Today's Date: 05/07/2014    History of Present Illness 68 y.o. male admitted to [facility] on 05/06/14 for elective R TKA.  He is PWB 50%.  He has significant PMHx of orthostatic hypotension, sciatica, CAD, HTN, multiple R shoulder surgeries, L achilles' tendon repair.     PT Comments    Patient abel to walk out in hallway this morning with mild complaints of pain. Tolerated therex progression well. Will see again this afternoon to progress more with ambulation  Follow Up Recommendations  Home health PT     Equipment Recommendations  None recommended by PT    Recommendations for Other Services       Precautions / Restrictions Precautions Precautions: Knee Precaution Comments: reviewed no pillow under right knee and PWB 50% Restrictions Weight Bearing Restrictions: Yes RLE Weight Bearing: Partial weight bearing RLE Partial Weight Bearing Percentage or Pounds: 50    Mobility  Bed Mobility               General bed mobility comments: Pt in recliner before and after PT session. Bed mobility not assessed.  Transfers Overall transfer level: Needs assistance Equipment used: Rolling walker (2 wheeled) Transfers: Sit to/from Stand Sit to Stand: Min guard         General transfer comment: Cues for correct technique  Ambulation/Gait Ambulation/Gait assistance: Min guard Ambulation Distance (Feet): 80 Feet Assistive device: Rolling walker (2 wheeled) Gait Pattern/deviations: Step-to pattern     General Gait Details: Cues for heel strike and turns.    Stairs            Wheelchair Mobility    Modified Rankin (Stroke Patients Only)       Balance                                    Cognition Arousal/Alertness: Awake/alert Behavior During Therapy: WFL for tasks assessed/performed Overall Cognitive Status: Within Functional Limits for tasks  assessed                      Exercises Total Joint Exercises Quad Sets: AROM;Right;10 reps Heel Slides: AAROM;Right;10 reps Hip ABduction/ADduction: AAROM;Right;10 reps Straight Leg Raises: AAROM;Right;10 reps Long Arc Quad: AAROM;Right;10 reps    General Comments        Pertinent Vitals/Pain 3/10 R knee pain. RN provided medication to assist with pain control     Home Living Family/patient expects to be discharged to:: Private residence Living Arrangements: Spouse/significant other Available Help at Discharge: Family;Available 24 hours/day Type of Home: House Home Access: Stairs to enter Entrance Stairs-Rails: Right;Left;Can reach both Home Layout: Two level;Other (Comment) (has chair lift) Home Equipment: Walker - 2 wheels;Cane - single point;Bedside commode;Tub bench;Hand held shower head;Other (comment) (chair lift)      Prior Function Level of Independence: Independent      Comments: per pt he was not using and assistive device to walk "just hobbling around"   PT Goals (current goals can now be found in the care plan section) Progress towards PT goals: Progressing toward goals    Frequency  7X/week    PT Plan Current plan remains appropriate    Co-evaluation             End of Session Equipment Utilized During Treatment: Gait belt Activity Tolerance: Patient tolerated treatment well Patient left: in chair;with call  bell/phone within reach;with family/visitor present     Time: 1025-1049 PT Time Calculation (min): 24 min  Charges:  $Gait Training: 8-22 mins $Therapeutic Exercise: 8-22 mins                    G Codes:      Tonia Brooms Robinette 05/07/2014, 11:18 AM 05/07/2014 [place] PTA [phone] pager [phone] office

## 2014-05-07 NOTE — Plan of Care (Signed)
Problem: Consults Goal: Diagnosis- Total Joint Replacement Primary Total Knee Right     

## 2014-05-07 NOTE — Discharge Instructions (Signed)
Information on my medicine - XARELTO® (Rivaroxaban) ° °This medication education was reviewed with me or my healthcare representative as part of my discharge preparation.  The pharmacist that spoke with me during my hospital stay was:  Yamil Dougher Poteet Floretta Petro, RPH ° °Why was Xarelto® prescribed for you? °Xarelto® was prescribed for you to reduce the risk of blood clots forming after orthopedic surgery. The medical term for these abnormal blood clots is venous thromboembolism (VTE). ° °What do you need to know about xarelto® ? °Take your Xarelto® ONCE DAILY at the same time every day. °You may take it either with or without food. ° °If you have difficulty swallowing the tablet whole, you may crush it and mix in applesauce just prior to taking your dose. ° °Take Xarelto® exactly as prescribed by your doctor and DO NOT stop taking Xarelto® without talking to the doctor who prescribed the medication.  Stopping without other VTE prevention medication to take the place of Xarelto® may increase your risk of developing a clot. ° °After discharge, you should have regular check-up appointments with your healthcare provider that is prescribing your Xarelto®.   ° °What do you do if you miss a dose? °If you miss a dose, take it as soon as you remember on the same day then continue your regularly scheduled once daily regimen the next day. Do not take two doses of Xarelto® on the same day.  ° °Important Safety Information °A possible side effect of Xarelto® is bleeding. You should call your healthcare provider right away if you experience any of the following: °  Bleeding from an injury or your nose that does not stop. °  Unusual colored urine (red or dark brown) or unusual colored stools (red or black). °  Unusual bruising for unknown reasons. °  A serious fall or if you hit your head (even if there is no bleeding). ° °Some medicines may interact with Xarelto® and might increase your risk of bleeding while on Xarelto®. To help avoid  this, consult your healthcare provider or pharmacist prior to using any new prescription or non-prescription medications, including herbals, vitamins, non-steroidal anti-inflammatory drugs (NSAIDs) and supplements. ° °This website has more information on Xarelto®: www.xarelto.com. ° ° °

## 2014-05-07 NOTE — Op Note (Signed)
[name], [name] NO.:  [id number]  MEDICAL RECORD NO.:  [id number]  LOCATION:  5N15C                        FACILITY:  [facility]  PHYSICIAN:  Christian [name], Christian ParkerDATE OF BIRTH:  03-09-1946  DATE OF PROCEDURE:  05/06/2014 DATE OF DISCHARGE:                              OPERATIVE REPORT   PREOPERATIVE DIAGNOSIS:  End-stage osteoarthritis, right knee.  POSTOPERATIVE DIAGNOSIS:  End-stage osteoarthritis, right knee.  PROCEDURE:  Right total knee replacement.  SURGEON:  Christian [name]. Christian [name], Christian [name]  ASSISTANT:  Christian Borg, PA-C who was present throughout the operative procedure to ensure its timely completion.  ANESTHESIA:  General with femoral nerve block.  COMPLICATIONS:  None.  COMPONENTS:  DePuy LCS large femoral component, a 10-mm polyethylene bridging bearing with the #4 keeled tibial tray, a 3 PEG metal back rotating patella.  Components were secured with polymethyl methacrylate.  PROCEDURE:  Christian [name] was met in the holding area, identified the right knee as appropriate operative site and marked it accordingly.  He received a femoral nerve block per Anesthesia.  The patient was then transported to room #7 and placed under general orotracheal anesthesia without difficulty.  The right lower extremity was placed in a thigh tourniquet.  The leg was then prepped with chlorhexidine scrub and then DuraPrep and the tourniquet to the tips of the toes, sterile draping was performed.  With the extremity elevated and he was Esmarch exsanguinated with a proximal tourniquet at 350 mmHg.  A midline longitudinal incision was made centered about the patella, extending from the superior pouch to tibial tubercle.  Via sharp dissection, incision was carried down to subcutaneous tissue, into the first layer of capsule and medial parapatellar incision was then made with the Bovie through the deep capsule.  Joint was entered.  There was a minimal clear yellow joint  effusion at the patellae everted 180 degrees laterally, knee flexed to 90 degrees.  There were large osteophytes along the medial lateral femoral condyle near complete absence of articular cartilage along the entire medial femoral condyle and to a greater extent of the medial tibial plateau.  I released the soft tissue along the medial tibia as the patient is a fixed varus deformity.  I then measured a large femoral component.  With the knee at 90 degrees, the 1st external guide was applied with the tibial cut.  Oscillating saw was used to make the transverse proximal tibial cut with a 7-degree angle of declination.  Subsequent cuts were then made on the femur using the large femoral jig.  Flexion, extension gaps were perfectly symmetrical at 10 mm.  Lamina spreader was inserted along the medial lateral compartments to remove medial lateral menisci as well as ACL and PCL on osteophytes and medial, lateral femoral condyles posteriorly.  I did perform a synovectomy with the Bovie as there was a moderate amount of beefy-red synovitis.  We did check flexion-extension gaps above at 10 mm.  There were symmetrical as mentioned.  There were no loose bodies.  Final cuts were then made on the femur using the finishing guide.  We did use a 4-degree distal femoral valgus cut.  Retractors were placed around the tibia.  We measured  a #4 tibial tray, this was pinned in place.  The center hole was made followed by the keeled cut with the trial tibial jig in place to 10-mm polyethylene rotating bridging bearing was inserted followed by the large femoral trial component.  The knee was reduced and through a full range of motion a full extension and flexion to over 120 degrees without malrotation of the tibial tray, there was no opening with varus and valgus stress.  Patella was prepared by removing 13 mm of patella thickness leaving 13 mm patella thickness.  The 3 pegged patella jig was then applied.   Two holes were made and the trial patella inserted through full range of motion after reduction was stable.  Trial components removed, the joint was copiously irrigated with saline solution.  We changed gloves.  The final components were then impacted with polymethyl methacrylate.  We initially applied the tibia followed by the tibial tray in the femur.  They were impacted in place. Extraneous methacrylate was removed from the periphery of the patella and was applied with methacrylate and a patellar clamp.  At approximately 16 minutes, the methacrylate had matured and the joint was inspected.  There was no further extraneous methacrylate tourniquet was deflated approximately 79 minutes.  We had immediate bleeding into the soft tissue, this was controlled with a Bovie.  We did inject 0.25% Marcaine with epinephrine into the deep capsule.  A medium-size Hemovac was inserted and clamped.  We again irrigated the wound.  The deep capsule was then closed with #1 Ethibond superficial capsule with running 0 Vicryl, subcu 2-0 Vicryl and 3-0 Monocryl.  Skin closed with skin clips.  Sterile bulky dressing was applied followed by the patient's support stocking.  The patient tolerated the procedure without complications.     Christian [name], M.D.     PWW/MEDQ  D:  05/06/2014  T:  05/07/2014  Job:  [id number]

## 2014-05-07 NOTE — Progress Notes (Signed)
Occupational Therapy Evaluation and Discharge Patient Details Name: Christian [name] MRN: [id number] DOB: Oct 13, 1946 Today's Date: 05/07/2014    History of Present Illness 68 y.o. male admitted to [facility] on 05/06/14 for elective R TKA.  He is PWB 50%.  He has significant PMHx of orthostatic hypotension, sciatica, CAD, HTN, multiple R shoulder surgeries, L achilles' tendon repair.    Clinical Impression   PTA pt lived at home with wife and was independent with ADLs and functional mobility, though he reports difficulty with ambulation he did not use an assistive device. Pt has several DME items at home (tub bench, BSC, RW, etc) from family members. Brief assessment completed to determine OT needs. Pt reports that wife will be home to assist with ADLs and pt has no OT needs at this time. Acute OT to sign off.     Follow Up Recommendations  No OT follow up;Supervision/Assistance - 24 hour    Equipment Recommendations  None recommended by OT       Precautions / Restrictions Precautions Precautions: Knee Precaution Comments: reviewed no pillow under right knee and PWB 50% Restrictions Weight Bearing Restrictions: Yes RLE Weight Bearing: Partial weight bearing RLE Partial Weight Bearing Percentage or Pounds: 50      Mobility Bed Mobility               General bed mobility comments: Pt in recliner before and after OT session. Bed mobility not assessed.  Transfers Overall transfer level: Needs assistance Equipment used: Rolling walker (2 wheeled) Transfers: Sit to/from Stand Sit to Stand: Min assist         General transfer comment: Pt at min A but progressing towards min guard         ADL Overall ADL's : Needs assistance/impaired Eating/Feeding: Independent;Sitting   Grooming: Set up;Sitting   Upper Body Bathing: Set up;Sitting   Lower Body Bathing: Minimal assistance;Sit to/from stand   Upper Body Dressing : Set up;Sitting   Lower Body Dressing: Minimal  assistance;Sit to/from stand   Toilet Transfer: Minimal assistance;BSC;RW   Toileting- Clothing Manipulation and Hygiene: Minimal assistance;Sit to/from stand   Tub/ Shower Transfer: Tub transfer;Tub bench;Minimal assistance (sit<>stand)     General ADL Comments: Brief assessment completed as pt has DME to meet needs at home. Pt with Min A for sit<>stand but quickly progressing to min guard. Pt will have wife at home to assist with needs.      Vision  Per pt report, no change from baseline.                   Perception Perception Perception Tested?: No   Praxis Praxis Praxis tested?: Within functional limits    Pertinent Vitals/Pain 0/10 per pt report     Hand Dominance Right   Extremity/Trunk Assessment Upper Extremity Assessment Upper Extremity Assessment: Overall WFL for tasks assessed   Lower Extremity Assessment Lower Extremity Assessment: Defer to PT evaluation   Cervical / Trunk Assessment Cervical / Trunk Assessment: Normal   Communication Communication Communication: No difficulties   Cognition Arousal/Alertness: Awake/alert Behavior During Therapy: WFL for tasks assessed/performed Overall Cognitive Status: Within Functional Limits for tasks assessed                                Home Living Family/patient expects to be discharged to:: Private residence Living Arrangements: Spouse/significant other Available Help at Discharge: Family;Available 24 hours/day Type of Home: House Home Access: Stairs  to enter Entrance Stairs-Number of Steps: 2 Entrance Stairs-Rails: Right;Left;Can reach both Home Layout: Two level;Other (Comment) (has chair lift) Alternate Level Stairs-Number of Steps: flight Alternate Level Stairs-Rails: Left (pt has chair lift to access upstairs) Bathroom Shower/Tub: Tub/shower unit Shower/tub characteristics: Architectural technologist: Standard     Home Equipment: Environmental consultant - 2 wheels;Cane - single point;Bedside  commode;Tub bench;Hand held shower head;Other (comment) (chair lift)          Prior Functioning/Environment Level of Independence: Independent        Comments: per pt he was not using and assistive device to walk "just hobbling around"                              End of Session Equipment Utilized During Treatment: Rolling walker;Gait belt CPM Right Knee CPM Right Knee: Off  Activity Tolerance: Patient tolerated treatment well Patient left: in chair;with call bell/phone within reach;with family/visitor present   Time: 1000-1012 OT Time Calculation (min): 12 min Charges:  OT General Charges $OT Visit: 1 Procedure OT Evaluation $Initial OT Evaluation Tier I: 1 Procedure  Juluis Rainier [phone] 05/07/2014, 10:21 AM

## 2014-05-07 NOTE — Care Management Note (Signed)
CARE MANAGEMENT NOTE 05/07/2014  Patient:  GONSALO, CUTHBERTSON   Account Number:  [id number]  Date Initiated:  05/07/2014  Documentation initiated by:  Ricki Miller  Subjective/Objective Assessment:   68 yr old male s/p right total knee arthroplasty.     Action/Plan:   Case manager spoke with patient and wife concerning home health and DME needs at discharge. Preoperatively setup with [facility], no changes. Patient has roling walker, 3in and shower seat.Has family support at discharge.   Anticipated DC Date:  05/08/2014   Anticipated DC Plan:  [place]  CM consult      [facility] Choice  HOME HEALTH  DURABLE MEDICAL EQUIPMENT   Choice offered to / List presented to:  C-1 Patient   DME arranged  [facility]      DME agency  [employer]     [place] arranged  HH-2 PT      [place]   Status of service:  Completed, signed off Medicare Important Message given?  NA - LOS <3 / Initial given by admissions (If response is "NO", the following Medicare IM given date fields will be blank) Date Medicare IM given:   Date Additional Medicare IM given:    Discharge Disposition:  [place]  Per UR Regulation:  Reviewed for med. necessity/level of care/duration of stay

## 2014-05-08 DIAGNOSIS — M1710 Unilateral primary osteoarthritis, unspecified knee: Secondary | ICD-10-CM | POA: Diagnosis present

## 2014-05-08 DIAGNOSIS — M179 Osteoarthritis of knee, unspecified: Secondary | ICD-10-CM | POA: Diagnosis present

## 2014-05-08 DIAGNOSIS — E669 Obesity, unspecified: Secondary | ICD-10-CM | POA: Diagnosis present

## 2014-05-08 LAB — CBC
HCT: 33.4 % — ABNORMAL LOW (ref 39.0–52.0)
HEMOGLOBIN: 11.3 g/dL — AB (ref 13.0–17.0)
MCH: 31.7 pg (ref 26.0–34.0)
MCHC: 33.8 g/dL (ref 30.0–36.0)
MCV: 93.8 fL (ref 78.0–100.0)
PLATELETS: 154 10*3/uL (ref 150–400)
RBC: 3.56 MIL/uL — AB (ref 4.22–5.81)
RDW: 12.7 % (ref 11.5–15.5)
WBC: 9.8 10*3/uL (ref 4.0–10.5)

## 2014-05-08 LAB — BASIC METABOLIC PANEL
BUN: 10 mg/dL (ref 6–23)
CHLORIDE: 106 meq/L (ref 96–112)
CO2: 26 meq/L (ref 19–32)
Calcium: 8.7 mg/dL (ref 8.4–10.5)
Creatinine, Ser: 1.12 mg/dL (ref 0.50–1.35)
GFR calc Af Amer: 77 mL/min — ABNORMAL LOW (ref >90)
GFR calc non Af Amer: 66 mL/min — ABNORMAL LOW (ref >90)
Glucose, Bld: 120 mg/dL — ABNORMAL HIGH (ref 70–99)
Potassium: 4.9 mEq/L (ref 3.7–5.3)
SODIUM: 140 meq/L (ref 137–147)

## 2014-05-08 MED ORDER — OXYCODONE HCL 5 MG PO TABS: 5.0000 mg | ORAL_TABLET | ORAL | Status: DC | PRN

## 2014-05-08 MED ORDER — RIVAROXABAN 10 MG PO TABS: 10.0000 mg | ORAL_TABLET | ORAL | Status: DC

## 2014-05-08 MED ORDER — METHOCARBAMOL 500 MG PO TABS: 500.0000 mg | ORAL_TABLET | Freq: Three times a day (TID) | ORAL | Status: DC | PRN

## 2014-05-08 NOTE — Discharge Summary (Signed)
[name], MD   Biagio Borg, PA-C [address], [place], [place]  [address]                             [phone]  PATIENT ID: Christian [name]        MRN:  [id number]          DOB/AGE: [birth year] / 67 y.o.    DISCHARGE SUMMARY  ADMISSION DATE:    05/06/2014 DISCHARGE DATE:   05/08/2014   ADMISSION DIAGNOSIS: RIGHT KNEE OSTEOARTHRITIS    DISCHARGE DIAGNOSIS:  RIGHT KNEE OSTEOARTHRITIS    ADDITIONAL DIAGNOSIS: Principal Problem:   Osteoarthritis of knee Active Problems:   S/P total knee replacement using cement   Obesity, unspecified  Past Medical History  Diagnosis Date  . Orthostatic hypotension   . Sciatica   . Coronary artery disease 3/12    80% stenosis of diagonal too small for PCI  . Hypercholesterolemia     takes Crestor daily  . Diastolic dysfunction   . Hypertension     takes Imdur and Lisinopril daily  . Joint pain   . Joint swelling   . Hemorrhoids   . Cataracts, bilateral     immature  . Heart murmur   . Arthritis     "joints; shoulders; left elbow; right thumb" (05/06/2014)    PROCEDURE: Procedure(s): RIGHT TOTAL KNEE ARTHROPLASTY Right on 05/06/2014  CONSULTS: none     HISTORY: Christian [name] is a very pleasant 68 year old retired white male who is seen today for evaluation of his right knee. He has had chronic problems with his right knee for many years. He has done a lot of walking in his career while working for the [employer] and certainly started having more problems with his knee. He is having difficulty with weather changes and with nighttime pain. He also has occasional weakness and giving way of the knee. He does use a support sometimes, that of a knee sleeve. He goes out at times for bike rides for enjoyment. Recently he has had x-rays revealing bone-on-bone tricompartment OA, more medial than lateral. He has tried with corticosteroid injections and Hyalgan injections, and his last one was back in March of this year. There was  essentially no improvement. He now states he has pain at nighttime, pain with every step, and pain with activities of daily living. At times he has even had to hold onto cabinets because of the pain and instability. He denies any hip pain or any neurovascular compromise   HOSPITAL COURSE:  Christian [name] is a 68 y.o. admitted on 05/06/2014 and found to have a diagnosis of [place].  After appropriate laboratory studies were obtained  they were taken to the operating room on 05/06/2014 and underwent  Procedure(s): RIGHT TOTAL KNEE ARTHROPLASTY  .   They were given perioperative antibiotics:  Anti-infectives   Start     Dose/Rate Route Frequency Ordered Stop   05/06/14 1430  ceFAZolin (ANCEF) IVPB 2 g/50 mL premix     2 g 100 mL/hr over 30 Minutes Intravenous Every 6 hours 05/06/14 1420 05/06/14 2250   05/06/14 0600  ceFAZolin (ANCEF) 3 g in dextrose 5 % 50 mL IVPB     3 g 160 mL/hr over 30 Minutes Intravenous On call to O.R. 05/05/14 1415 05/06/14 0730    .  Toradol was given post op.  POD #1, allowed out of bed to a chair.  PT for ambulation and exercise program.  IV saline locked.  O2 discontionued.  POD #2, continued PT and ambulation.   Hemovac pulled. . The remainder of the hospital course was dedicated to ambulation and strengthening.   The patient was discharged on 2 Days Post-Op in  Stable condition.  Blood products given:none  DIAGNOSTIC STUDIES: Recent vital signs: Patient Vitals for the past 24 hrs:  BP Temp Temp src Pulse Resp SpO2  05/08/14 1319 122/58 mmHg 99.1 F (37.3 C) Oral 84 18 95 %  05/08/14 0612 138/77 mmHg 98.7 F (37.1 C) Oral 75 18 98 %  05/08/14 0000 - - - - 18 98 %  05/07/14 2041 123/60 mmHg 99.7 F (37.6 C) Oral 82 18 98 %  05/07/14 2000 - - - - 18 98 %  05/07/14 1600 - - - - 18 -       Recent laboratory studies:  Recent Labs  05/07/14 0436 05/08/14 0623  WBC 8.4 9.8  HGB 10.4* 11.3*  HCT 30.8* 33.4*  PLT 156 154     Recent Labs  05/07/14 0436 05/08/14 0623  NA 137 140  K 4.1 4.9  CL 103 106  CO2 24 26  BUN 15 10  CREATININE 1.17 1.12  GLUCOSE 111* 120*  CALCIUM 7.7* 8.7   Lab Results  Component Value Date   INR 0.99 04/29/2014   INR 0.96 01/07/2010     Recent Radiographic Studies :  Chest 2 View  04/29/2014   CLINICAL DATA:  Preoperative respiratory examination for knee surgery.  EXAM: CHEST  2 VIEW  COMPARISON:  None.  FINDINGS: The cardiomediastinal silhouette is unremarkable.  The lungs are clear.  There is no evidence of focal airspace disease, pulmonary edema, suspicious pulmonary nodule/mass, pleural effusion, or pneumothorax.  No acute bony abnormalities are identified.  IMPRESSION: No active cardiopulmonary disease.   Electronically Signed   By: Hassan Rowan M.D.   On: 04/29/2014 10:03    DISCHARGE INSTRUCTIONS: Discharge Orders   Future Appointments Provider Department Dept Phone   05/20/2014 8:50 AM [facility] [place] Office [phone]   09/16/2014 10:00 AM Aris [place], [facility] [phone]   Future Orders Complete By Expires   Call MD / Call 911  As directed    Change dressing  As directed    Constipation Prevention  As directed    CPM  As directed    Diet - low sodium heart healthy  As directed    Discharge instructions  As directed    Do not put a pillow under the knee. Place it under the heel.  As directed    Driving restrictions  As directed    Increase activity slowly as tolerated  As directed    Lifting restrictions  As directed    Partial weight bearing  As directed    Questions:     % Body Weight:     Laterality:     Extremity:     Patient may shower  As directed    TED hose  As directed       DISCHARGE MEDICATIONS:     Medication List    STOP taking these medications       aspirin 81 MG tablet     ibuprofen 200 MG tablet  Commonly known as:  ADVIL,MOTRIN      TAKE these medications       5 SERIES BP  MONITOR Kerrin Mo  IMDUR 30 MG 24 hr tablet  Generic drug:  isosorbide mononitrate  Take 30 mg by mouth daily.     lisinopril 10 MG tablet  Commonly known as:  PRINIVIL,ZESTRIL  Take 10 mg by mouth daily.     methocarbamol 500 MG tablet  Commonly known as:  ROBAXIN  Take 1 tablet (500 mg total) by mouth every 8 (eight) hours as needed for muscle spasms.     nitroGLYCERIN 0.4 MG SL tablet  Commonly known as:  NITROSTAT  Place 0.4 mg under the tongue every 5 (five) minutes as needed for chest pain.     oxyCODONE 5 MG immediate release tablet  Commonly known as:  Oxy IR/ROXICODONE  Take 1-2 tablets (5-10 mg total) by mouth every 4 (four) hours as needed for breakthrough pain.     psyllium 28 % packet  Commonly known as:  METAMUCIL SMOOTH TEXTURE  Take 1 packet by mouth 2 (two) times daily.     rivaroxaban 10 MG Tabs tablet  Commonly known as:  XARELTO  Take 1 tablet (10 mg total) by mouth daily.     rosuvastatin 10 MG tablet  Commonly known as:  CRESTOR  Take 10 mg by mouth daily.        FOLLOW UP VISIT:       Follow-up Information   Follow up with [facility]. (Someone from [facility] will contact you concerning start date and time for physical therapy. )    Contact information:   [place] [phone]       Follow up with Garald Balding, MD. Schedule an appointment as soon as possible for a visit on 05/19/2014.   Specialty:  Orthopedic Surgery   Contact information:   [place] [place] [address] [phone]       DISPOSITION:   Home  CONDITION:  Stable   Biagio Borg 05/08/2014, 2:54 PM

## 2014-05-08 NOTE — Progress Notes (Signed)
Patient ID: Christian [name], male   DOB: 04/25/1946, 68 y.o.   MRN: [id number] PATIENT ID: Christian [name]        MRN:  [id number]          DOB/AGE: 02/22/1946 / 68 y.o.    Christian Fears, MD   Christian Borg, PA-C [address] [place],   [address]                             [phone]   PROGRESS NOTE  Subjective:  negative for Chest Pain  negative for Shortness of Breath  negative for Nausea/Vomiting   negative for Calf Pain    Tolerating Diet: yes         Patient reports pain as mild and moderate.     Doing well, desires to go home   Objective: Vital signs in last 24 hours:   Patient Vitals for the past 24 hrs:  BP Temp Temp src Pulse Resp SpO2  05/08/14 1319 122/58 mmHg 99.1 F (37.3 C) Oral 84 18 95 %  05/08/14 0612 138/77 mmHg 98.7 F (37.1 C) Oral 75 18 98 %  05/08/14 0000 - - - - 18 98 %  05/07/14 2041 123/60 mmHg 99.7 F (37.6 C) Oral 82 18 98 %  05/07/14 2000 - - - - 18 98 %  05/07/14 1600 - - - - 18 -      Intake/Output from previous day:   05/13 0701 - 05/14 0700 In: 1140 [P.O.:1140] Out: 1450 [Urine:1250; Drains:200]   Intake/Output this shift:   05/14 0701 - 05/14 1900 In: 520 [P.O.:520] Out: 1000 [Urine:1000]   Intake/Output     05/13 0701 - 05/14 0700 05/14 0701 - 05/15 0700   P.O. 1140 520   I.V. (mL/kg)     Total Intake(mL/kg) 1140 (9.1) 520 (4.1)   Urine (mL/kg/hr) 1250 (0.4) 1000 (1)   Drains 200 (0.1)    Total Output 1450 1000   Net -310 -480        Urine Occurrence 1 x    Stool Occurrence  1 x      LABORATORY DATA:  Recent Labs  05/07/14 0436 05/08/14 0623  WBC 8.4 9.8  HGB 10.4* 11.3*  HCT 30.8* 33.4*  PLT 156 154    Recent Labs  05/07/14 0436 05/08/14 0623  NA 137 140  K 4.1 4.9  CL 103 106  CO2 24 26  BUN 15 10  CREATININE 1.17 1.12  GLUCOSE 111* 120*  CALCIUM 7.7* 8.7   Lab Results  Component Value Date   INR 0.99 04/29/2014   INR 0.96 01/07/2010    Recent Radiographic Studies :  Chest 2  View  04/29/2014   CLINICAL DATA:  Preoperative respiratory examination for knee surgery.  EXAM: CHEST  2 VIEW  COMPARISON:  None.  FINDINGS: The cardiomediastinal silhouette is unremarkable.  The lungs are clear.  There is no evidence of focal airspace disease, pulmonary edema, suspicious pulmonary nodule/mass, pleural effusion, or pneumothorax.  No acute bony abnormalities are identified.  IMPRESSION: No active cardiopulmonary disease.   Electronically Signed   By: Hassan Rowan M.D.   On: 04/29/2014 10:03     Examination:  General appearance: alert and cooperative  Wound Exam: clean, dry, intact   Drainage:  None: wound tissue dry  Motor Exam: EHL, FHL, Anterior Tibial and Posterior Tibial Intact  Sensory Exam: Superficial Peroneal, Deep Peroneal and Tibial normal  Vascular Exam: Right posterior tibial artery has 1+ (weak) pulse and Right dorsalis pedis artery has 1+ (weak) pulse  Assessment:    2 Days Post-Op  Procedure(s) (LRB): RIGHT TOTAL KNEE ARTHROPLASTY (Right)  ADDITIONAL DIAGNOSIS:  Principal Problem:   Osteoarthritis of knee Active Problems:   S/P total knee replacement using cement   Obesity, unspecified  Hypertension   Plan: Physical Therapy as ordered Partial Weight Bearing @ 50% (PWB)  DVT Prophylaxis:  Xarelto, Foot Pumps and TED hose  DISCHARGE PLAN: Home  DISCHARGE NEEDS: HHPT, CPM, Walker and 3-in-1 comode seat         Christian [name] 05/08/2014 2:48 PM

## 2014-05-08 NOTE — Progress Notes (Signed)
Physical Therapy Treatment Patient Details Name: Christian [name] MRN: [id number] DOB: May 26, 1946 Today's Date: 05/08/2014    History of Present Illness 68 y.o. male admitted to [facility] on 05/06/14 for elective R TKA.  He is PWB 50%.  He has significant PMHx of orthostatic hypotension, sciatica, CAD, HTN, multiple R shoulder surgeries, L achilles' tendon repair.     PT Comments    Patient had requested to practice steps again this afternoon. Good recall of technique. Patient is eager to DC home. Patient not planning to initially go up full flight of steps.   Follow Up Recommendations  Home health PT     Equipment Recommendations  None recommended by PT    Recommendations for Other Services       Precautions / Restrictions Precautions Precautions: Knee Precaution Comments: reviewed no pillow under right knee and PWB 50% Restrictions RLE Weight Bearing: Partial weight bearing RLE Partial Weight Bearing Percentage or Pounds: 50    Mobility  Bed Mobility Overal bed mobility: Needs Assistance       Supine to sit: Supervision     General bed mobility comments: Patient sitting up in recliner upon entering room  Transfers Overall transfer level: Needs assistance Equipment used: Rolling walker (2 wheeled)   Sit to Stand: Supervision         General transfer comment: Cues for positioning  Ambulation/Gait Ambulation/Gait assistance: Min guard Ambulation Distance (Feet): 10 Feet Assistive device: Rolling walker (2 wheeled) Gait Pattern/deviations: Step-to pattern     General Gait Details: Patient just ambulated from recliner to steps to save energy for DC   Stairs Stairs: Yes Stairs assistance: Min assist Stair Management: No rails;Step to pattern;Backwards;With walker Number of Stairs: 2 General stair comments: Patient able to recall technique for stairs with min cueing. A for RW  Wheelchair Mobility    Modified Rankin (Stroke Patients Only)       Balance                                     Cognition Arousal/Alertness: Awake/alert Behavior During Therapy: WFL for tasks assessed/performed Overall Cognitive Status: Within Functional Limits for tasks assessed                      Exercises Total Joint Exercises Quad Sets: AROM;Right;10 reps Heel Slides: AAROM;Right;10 reps Hip ABduction/ADduction: AAROM;Right;10 reps Straight Leg Raises: AAROM;Right;10 reps Long Arc Quad: AAROM;Right;10 reps    General Comments        Pertinent Vitals/Pain no apparent distress     Home Living                      Prior Function            PT Goals (current goals can now be found in the care plan section) Progress towards PT goals: Progressing toward goals    Frequency  7X/week    PT Plan Current plan remains appropriate    Co-evaluation             End of Session Equipment Utilized During Treatment: Gait belt Activity Tolerance: Patient tolerated treatment well Patient left: with call bell/phone within reach;with family/visitor present;in chair     Time: 1340-1356 PT Time Calculation (min): 16 min  Charges:  $Gait Training: 8-22 mins $Therapeutic Exercise: 8-22 mins  G Codes:      Tonia Brooms Robinette 05/08/2014, 2:09 PM 05/08/2014 [place] PTA [phone] pager [phone] office

## 2014-05-08 NOTE — Progress Notes (Signed)
Physical Therapy Treatment Patient Details Name: Christian [name] MRN: [id number] DOB: 12-30-1945 Today's Date: 05/08/2014    History of Present Illness 68 y.o. male admitted to [facility] on 05/06/14 for elective R TKA.  He is PWB 50%.  He has significant PMHx of orthostatic hypotension, sciatica, CAD, HTN, multiple R shoulder surgeries, L achilles' tendon repair.     PT Comments    Patient a little more sore today but able to tolerate stair training. Will check back on later this afternoon to see if patient wishes to attempt anything more prior to DC home  Follow Up Recommendations  Home health PT     Equipment Recommendations  None recommended by PT    Recommendations for Other Services       Precautions / Restrictions Precautions Precautions: Knee Restrictions Weight Bearing Restrictions: Yes RLE Weight Bearing: Partial weight bearing RLE Partial Weight Bearing Percentage or Pounds: 50    Mobility  Bed Mobility               General bed mobility comments: Patient sitting up in recliner upon entering room  Transfers Overall transfer level: Needs assistance Equipment used: Rolling walker (2 wheeled)   Sit to Stand: Min guard         General transfer comment: Cues for hand placement. Patient tends to have increased forward flexion with stand but no balance issues  Ambulation/Gait Ambulation/Gait assistance: Min guard Ambulation Distance (Feet): 50 Feet Assistive device: Rolling walker (2 wheeled) Gait Pattern/deviations: Step-to pattern     General Gait Details: Patient putting decreased weight through R LE this session. Appears more antalgic. Cues for posture   Stairs Stairs: Yes Stairs assistance: Min assist Stair Management: No rails;Step to pattern;Backwards;With walker Number of Stairs: 2 General stair comments: Patient practiced stairs x2 with Min A for balance and to position RW.   Wheelchair Mobility    Modified Rankin (Stroke Patients Only)        Balance                                    Cognition Arousal/Alertness: Awake/alert Behavior During Therapy: WFL for tasks assessed/performed Overall Cognitive Status: Within Functional Limits for tasks assessed                      Exercises Total Joint Exercises Quad Sets: AROM;Right;10 reps Heel Slides: AAROM;Right;10 reps Hip ABduction/ADduction: AAROM;Right;10 reps Straight Leg Raises: AAROM;Right;10 reps Long Arc Quad: AAROM;Right;10 reps    General Comments        Pertinent Vitals/Pain 7/10 R knee pain. patient repositioned for comfort     Home Living                      Prior Function            PT Goals (current goals can now be found in the care plan section) Progress towards PT goals: Progressing toward goals    Frequency  7X/week    PT Plan Current plan remains appropriate    Co-evaluation             End of Session Equipment Utilized During Treatment: Gait belt Activity Tolerance: Patient tolerated treatment well Patient left: with call bell/phone within reach;with family/visitor present;in bed;in CPM     Time: 6468-0321 PT Time Calculation (min): 25 min  Charges:  $Gait Training: 8-22 mins $Therapeutic Exercise: 8-22 mins  G Codes:      Tonia Brooms Robinette 05/08/2014, 10:48 AM  05/08/2014 [place] PTA [phone] pager [phone] office

## 2014-05-09 DIAGNOSIS — Z471 Aftercare following joint replacement surgery: Secondary | ICD-10-CM | POA: Diagnosis not present

## 2014-05-09 DIAGNOSIS — I10 Essential (primary) hypertension: Secondary | ICD-10-CM | POA: Diagnosis not present

## 2014-05-09 DIAGNOSIS — IMO0001 Reserved for inherently not codable concepts without codable children: Secondary | ICD-10-CM | POA: Diagnosis not present

## 2014-05-09 DIAGNOSIS — Z4801 Encounter for change or removal of surgical wound dressing: Secondary | ICD-10-CM | POA: Diagnosis not present

## 2014-05-09 DIAGNOSIS — Z96659 Presence of unspecified artificial knee joint: Secondary | ICD-10-CM | POA: Diagnosis not present

## 2014-05-12 DIAGNOSIS — I10 Essential (primary) hypertension: Secondary | ICD-10-CM | POA: Diagnosis not present

## 2014-05-12 DIAGNOSIS — IMO0001 Reserved for inherently not codable concepts without codable children: Secondary | ICD-10-CM | POA: Diagnosis not present

## 2014-05-12 DIAGNOSIS — Z4801 Encounter for change or removal of surgical wound dressing: Secondary | ICD-10-CM | POA: Diagnosis not present

## 2014-05-12 DIAGNOSIS — Z471 Aftercare following joint replacement surgery: Secondary | ICD-10-CM | POA: Diagnosis not present

## 2014-05-12 DIAGNOSIS — Z96659 Presence of unspecified artificial knee joint: Secondary | ICD-10-CM | POA: Diagnosis not present

## 2014-05-13 DIAGNOSIS — Z4801 Encounter for change or removal of surgical wound dressing: Secondary | ICD-10-CM | POA: Diagnosis not present

## 2014-05-13 DIAGNOSIS — Z96659 Presence of unspecified artificial knee joint: Secondary | ICD-10-CM | POA: Diagnosis not present

## 2014-05-13 DIAGNOSIS — IMO0001 Reserved for inherently not codable concepts without codable children: Secondary | ICD-10-CM | POA: Diagnosis not present

## 2014-05-13 DIAGNOSIS — I10 Essential (primary) hypertension: Secondary | ICD-10-CM | POA: Diagnosis not present

## 2014-05-13 DIAGNOSIS — Z471 Aftercare following joint replacement surgery: Secondary | ICD-10-CM | POA: Diagnosis not present

## 2014-05-14 DIAGNOSIS — IMO0001 Reserved for inherently not codable concepts without codable children: Secondary | ICD-10-CM | POA: Diagnosis not present

## 2014-05-14 DIAGNOSIS — Z96659 Presence of unspecified artificial knee joint: Secondary | ICD-10-CM | POA: Diagnosis not present

## 2014-05-14 DIAGNOSIS — Z4801 Encounter for change or removal of surgical wound dressing: Secondary | ICD-10-CM | POA: Diagnosis not present

## 2014-05-14 DIAGNOSIS — I10 Essential (primary) hypertension: Secondary | ICD-10-CM | POA: Diagnosis not present

## 2014-05-14 DIAGNOSIS — Z471 Aftercare following joint replacement surgery: Secondary | ICD-10-CM | POA: Diagnosis not present

## 2014-05-15 DIAGNOSIS — I10 Essential (primary) hypertension: Secondary | ICD-10-CM | POA: Diagnosis not present

## 2014-05-15 DIAGNOSIS — Z471 Aftercare following joint replacement surgery: Secondary | ICD-10-CM | POA: Diagnosis not present

## 2014-05-15 DIAGNOSIS — IMO0001 Reserved for inherently not codable concepts without codable children: Secondary | ICD-10-CM | POA: Diagnosis not present

## 2014-05-15 DIAGNOSIS — Z4801 Encounter for change or removal of surgical wound dressing: Secondary | ICD-10-CM | POA: Diagnosis not present

## 2014-05-15 DIAGNOSIS — Z96659 Presence of unspecified artificial knee joint: Secondary | ICD-10-CM | POA: Diagnosis not present

## 2014-05-16 DIAGNOSIS — IMO0001 Reserved for inherently not codable concepts without codable children: Secondary | ICD-10-CM | POA: Diagnosis not present

## 2014-05-16 DIAGNOSIS — Z471 Aftercare following joint replacement surgery: Secondary | ICD-10-CM | POA: Diagnosis not present

## 2014-05-16 DIAGNOSIS — I10 Essential (primary) hypertension: Secondary | ICD-10-CM | POA: Diagnosis not present

## 2014-05-16 DIAGNOSIS — Z4801 Encounter for change or removal of surgical wound dressing: Secondary | ICD-10-CM | POA: Diagnosis not present

## 2014-05-16 DIAGNOSIS — Z96659 Presence of unspecified artificial knee joint: Secondary | ICD-10-CM | POA: Diagnosis not present

## 2014-05-19 DIAGNOSIS — Z471 Aftercare following joint replacement surgery: Secondary | ICD-10-CM | POA: Diagnosis not present

## 2014-05-19 DIAGNOSIS — I10 Essential (primary) hypertension: Secondary | ICD-10-CM | POA: Diagnosis not present

## 2014-05-19 DIAGNOSIS — Z96659 Presence of unspecified artificial knee joint: Secondary | ICD-10-CM | POA: Diagnosis not present

## 2014-05-19 DIAGNOSIS — IMO0001 Reserved for inherently not codable concepts without codable children: Secondary | ICD-10-CM | POA: Diagnosis not present

## 2014-05-19 DIAGNOSIS — Z4801 Encounter for change or removal of surgical wound dressing: Secondary | ICD-10-CM | POA: Diagnosis not present

## 2014-05-20 ENCOUNTER — Other Ambulatory Visit: Payer: Medicare Other

## 2014-05-20 DIAGNOSIS — IMO0001 Reserved for inherently not codable concepts without codable children: Secondary | ICD-10-CM | POA: Diagnosis not present

## 2014-05-20 DIAGNOSIS — Z471 Aftercare following joint replacement surgery: Secondary | ICD-10-CM | POA: Diagnosis not present

## 2014-05-20 DIAGNOSIS — Z96659 Presence of unspecified artificial knee joint: Secondary | ICD-10-CM | POA: Diagnosis not present

## 2014-05-20 DIAGNOSIS — I10 Essential (primary) hypertension: Secondary | ICD-10-CM | POA: Diagnosis not present

## 2014-05-20 DIAGNOSIS — Z4801 Encounter for change or removal of surgical wound dressing: Secondary | ICD-10-CM | POA: Diagnosis not present

## 2014-05-21 DIAGNOSIS — Z96659 Presence of unspecified artificial knee joint: Secondary | ICD-10-CM | POA: Diagnosis not present

## 2014-05-21 DIAGNOSIS — Z4801 Encounter for change or removal of surgical wound dressing: Secondary | ICD-10-CM | POA: Diagnosis not present

## 2014-05-21 DIAGNOSIS — Z471 Aftercare following joint replacement surgery: Secondary | ICD-10-CM | POA: Diagnosis not present

## 2014-05-21 DIAGNOSIS — I10 Essential (primary) hypertension: Secondary | ICD-10-CM | POA: Diagnosis not present

## 2014-05-21 DIAGNOSIS — IMO0001 Reserved for inherently not codable concepts without codable children: Secondary | ICD-10-CM | POA: Diagnosis not present

## 2014-05-22 DIAGNOSIS — Z471 Aftercare following joint replacement surgery: Secondary | ICD-10-CM | POA: Diagnosis not present

## 2014-05-22 DIAGNOSIS — Z4801 Encounter for change or removal of surgical wound dressing: Secondary | ICD-10-CM | POA: Diagnosis not present

## 2014-05-22 DIAGNOSIS — Z96659 Presence of unspecified artificial knee joint: Secondary | ICD-10-CM | POA: Diagnosis not present

## 2014-05-22 DIAGNOSIS — IMO0001 Reserved for inherently not codable concepts without codable children: Secondary | ICD-10-CM | POA: Diagnosis not present

## 2014-05-22 DIAGNOSIS — I10 Essential (primary) hypertension: Secondary | ICD-10-CM | POA: Diagnosis not present

## 2014-05-23 DIAGNOSIS — I10 Essential (primary) hypertension: Secondary | ICD-10-CM | POA: Diagnosis not present

## 2014-05-23 DIAGNOSIS — Z471 Aftercare following joint replacement surgery: Secondary | ICD-10-CM | POA: Diagnosis not present

## 2014-05-23 DIAGNOSIS — IMO0001 Reserved for inherently not codable concepts without codable children: Secondary | ICD-10-CM | POA: Diagnosis not present

## 2014-05-23 DIAGNOSIS — Z4801 Encounter for change or removal of surgical wound dressing: Secondary | ICD-10-CM | POA: Diagnosis not present

## 2014-05-23 DIAGNOSIS — Z96659 Presence of unspecified artificial knee joint: Secondary | ICD-10-CM | POA: Diagnosis not present

## 2014-05-26 DIAGNOSIS — Z4801 Encounter for change or removal of surgical wound dressing: Secondary | ICD-10-CM | POA: Diagnosis not present

## 2014-05-26 DIAGNOSIS — Z471 Aftercare following joint replacement surgery: Secondary | ICD-10-CM | POA: Diagnosis not present

## 2014-05-26 DIAGNOSIS — I10 Essential (primary) hypertension: Secondary | ICD-10-CM | POA: Diagnosis not present

## 2014-05-26 DIAGNOSIS — Z96659 Presence of unspecified artificial knee joint: Secondary | ICD-10-CM | POA: Diagnosis not present

## 2014-05-26 DIAGNOSIS — IMO0001 Reserved for inherently not codable concepts without codable children: Secondary | ICD-10-CM | POA: Diagnosis not present

## 2014-05-27 DIAGNOSIS — IMO0001 Reserved for inherently not codable concepts without codable children: Secondary | ICD-10-CM | POA: Diagnosis not present

## 2014-05-27 DIAGNOSIS — Z96659 Presence of unspecified artificial knee joint: Secondary | ICD-10-CM | POA: Diagnosis not present

## 2014-05-27 DIAGNOSIS — Z4801 Encounter for change or removal of surgical wound dressing: Secondary | ICD-10-CM | POA: Diagnosis not present

## 2014-05-27 DIAGNOSIS — I10 Essential (primary) hypertension: Secondary | ICD-10-CM | POA: Diagnosis not present

## 2014-05-27 DIAGNOSIS — Z471 Aftercare following joint replacement surgery: Secondary | ICD-10-CM | POA: Diagnosis not present

## 2014-05-29 DIAGNOSIS — Z471 Aftercare following joint replacement surgery: Secondary | ICD-10-CM | POA: Diagnosis not present

## 2014-05-29 DIAGNOSIS — Z96659 Presence of unspecified artificial knee joint: Secondary | ICD-10-CM | POA: Diagnosis not present

## 2014-05-29 DIAGNOSIS — Z4801 Encounter for change or removal of surgical wound dressing: Secondary | ICD-10-CM | POA: Diagnosis not present

## 2014-05-29 DIAGNOSIS — IMO0001 Reserved for inherently not codable concepts without codable children: Secondary | ICD-10-CM | POA: Diagnosis not present

## 2014-05-29 DIAGNOSIS — I10 Essential (primary) hypertension: Secondary | ICD-10-CM | POA: Diagnosis not present

## 2014-06-02 DIAGNOSIS — I10 Essential (primary) hypertension: Secondary | ICD-10-CM | POA: Diagnosis not present

## 2014-06-02 DIAGNOSIS — Z96659 Presence of unspecified artificial knee joint: Secondary | ICD-10-CM | POA: Diagnosis not present

## 2014-06-02 DIAGNOSIS — IMO0001 Reserved for inherently not codable concepts without codable children: Secondary | ICD-10-CM | POA: Diagnosis not present

## 2014-06-02 DIAGNOSIS — Z471 Aftercare following joint replacement surgery: Secondary | ICD-10-CM | POA: Diagnosis not present

## 2014-06-02 DIAGNOSIS — Z4801 Encounter for change or removal of surgical wound dressing: Secondary | ICD-10-CM | POA: Diagnosis not present

## 2014-06-03 ENCOUNTER — Ambulatory Visit: Payer: Federal, State, Local not specified - PPO | Admitting: Physical Therapy

## 2014-06-04 DIAGNOSIS — Z96659 Presence of unspecified artificial knee joint: Secondary | ICD-10-CM | POA: Diagnosis not present

## 2014-06-04 DIAGNOSIS — Z471 Aftercare following joint replacement surgery: Secondary | ICD-10-CM | POA: Diagnosis not present

## 2014-06-04 DIAGNOSIS — Z4801 Encounter for change or removal of surgical wound dressing: Secondary | ICD-10-CM | POA: Diagnosis not present

## 2014-06-04 DIAGNOSIS — I10 Essential (primary) hypertension: Secondary | ICD-10-CM | POA: Diagnosis not present

## 2014-06-04 DIAGNOSIS — IMO0001 Reserved for inherently not codable concepts without codable children: Secondary | ICD-10-CM | POA: Diagnosis not present

## 2014-06-05 ENCOUNTER — Ambulatory Visit: Payer: Medicare Other | Attending: Orthopaedic Surgery | Admitting: Physical Therapy

## 2014-06-05 DIAGNOSIS — R262 Difficulty in walking, not elsewhere classified: Secondary | ICD-10-CM | POA: Insufficient documentation

## 2014-06-05 DIAGNOSIS — M25669 Stiffness of unspecified knee, not elsewhere classified: Secondary | ICD-10-CM | POA: Diagnosis not present

## 2014-06-05 DIAGNOSIS — R609 Edema, unspecified: Secondary | ICD-10-CM | POA: Diagnosis not present

## 2014-06-05 DIAGNOSIS — M25569 Pain in unspecified knee: Secondary | ICD-10-CM | POA: Insufficient documentation

## 2014-06-05 DIAGNOSIS — IMO0001 Reserved for inherently not codable concepts without codable children: Secondary | ICD-10-CM | POA: Insufficient documentation

## 2014-06-09 ENCOUNTER — Ambulatory Visit: Payer: Medicare Other | Admitting: Physical Therapy

## 2014-06-12 ENCOUNTER — Ambulatory Visit: Payer: Medicare Other | Admitting: Physical Therapy

## 2014-06-17 ENCOUNTER — Ambulatory Visit: Payer: Medicare Other | Admitting: Physical Therapy

## 2014-06-19 ENCOUNTER — Ambulatory Visit: Payer: Medicare Other | Admitting: Physical Therapy

## 2014-06-24 ENCOUNTER — Ambulatory Visit: Payer: Medicare Other | Admitting: Physical Therapy

## 2014-06-25 ENCOUNTER — Ambulatory Visit: Payer: Medicare Other | Attending: Orthopaedic Surgery | Admitting: Physical Therapy

## 2014-06-25 DIAGNOSIS — IMO0001 Reserved for inherently not codable concepts without codable children: Secondary | ICD-10-CM | POA: Diagnosis not present

## 2014-06-25 DIAGNOSIS — R262 Difficulty in walking, not elsewhere classified: Secondary | ICD-10-CM | POA: Diagnosis not present

## 2014-06-25 DIAGNOSIS — R609 Edema, unspecified: Secondary | ICD-10-CM | POA: Diagnosis not present

## 2014-06-25 DIAGNOSIS — M25669 Stiffness of unspecified knee, not elsewhere classified: Secondary | ICD-10-CM | POA: Diagnosis not present

## 2014-06-25 DIAGNOSIS — M25569 Pain in unspecified knee: Secondary | ICD-10-CM | POA: Insufficient documentation

## 2014-07-01 ENCOUNTER — Ambulatory Visit: Payer: Medicare Other | Admitting: Physical Therapy

## 2014-07-01 DIAGNOSIS — IMO0001 Reserved for inherently not codable concepts without codable children: Secondary | ICD-10-CM | POA: Diagnosis not present

## 2014-07-03 ENCOUNTER — Ambulatory Visit: Payer: Medicare Other | Admitting: Physical Therapy

## 2014-07-03 DIAGNOSIS — IMO0001 Reserved for inherently not codable concepts without codable children: Secondary | ICD-10-CM | POA: Diagnosis not present

## 2014-07-08 ENCOUNTER — Ambulatory Visit: Payer: Medicare Other | Admitting: Physical Therapy

## 2014-07-08 DIAGNOSIS — IMO0001 Reserved for inherently not codable concepts without codable children: Secondary | ICD-10-CM | POA: Diagnosis not present

## 2014-07-10 ENCOUNTER — Ambulatory Visit: Payer: Medicare Other | Admitting: Physical Therapy

## 2014-07-15 ENCOUNTER — Ambulatory Visit: Payer: Medicare Other | Admitting: Physical Therapy

## 2014-07-16 ENCOUNTER — Ambulatory Visit: Payer: Medicare Other | Admitting: Physical Therapy

## 2014-07-16 DIAGNOSIS — IMO0001 Reserved for inherently not codable concepts without codable children: Secondary | ICD-10-CM | POA: Diagnosis not present

## 2014-07-17 ENCOUNTER — Ambulatory Visit: Payer: Medicare Other | Admitting: Physical Therapy

## 2014-07-18 ENCOUNTER — Encounter: Payer: Self-pay | Admitting: Cardiology

## 2014-07-22 ENCOUNTER — Ambulatory Visit: Payer: Medicare Other | Admitting: Physical Therapy

## 2014-07-22 DIAGNOSIS — IMO0001 Reserved for inherently not codable concepts without codable children: Secondary | ICD-10-CM | POA: Diagnosis not present

## 2014-07-24 ENCOUNTER — Ambulatory Visit: Payer: Medicare Other | Admitting: Physical Therapy

## 2014-07-24 DIAGNOSIS — IMO0001 Reserved for inherently not codable concepts without codable children: Secondary | ICD-10-CM | POA: Diagnosis not present

## 2014-08-21 DIAGNOSIS — H40019 Open angle with borderline findings, low risk, unspecified eye: Secondary | ICD-10-CM | POA: Diagnosis not present

## 2014-08-21 DIAGNOSIS — H04129 Dry eye syndrome of unspecified lacrimal gland: Secondary | ICD-10-CM | POA: Diagnosis not present

## 2014-08-23 ENCOUNTER — Other Ambulatory Visit: Payer: Self-pay | Admitting: Cardiology

## 2014-09-15 ENCOUNTER — Other Ambulatory Visit (INDEPENDENT_AMBULATORY_CARE_PROVIDER_SITE_OTHER): Payer: Medicare Other

## 2014-09-15 ENCOUNTER — Other Ambulatory Visit: Payer: Medicare Other

## 2014-09-15 DIAGNOSIS — E7800 Pure hypercholesterolemia, unspecified: Secondary | ICD-10-CM

## 2014-09-15 DIAGNOSIS — E785 Hyperlipidemia, unspecified: Secondary | ICD-10-CM

## 2014-09-15 DIAGNOSIS — Z79899 Other long term (current) drug therapy: Secondary | ICD-10-CM | POA: Diagnosis not present

## 2014-09-15 LAB — LIPID PANEL
CHOLESTEROL: 124 mg/dL (ref 0–200)
HDL: 39.7 mg/dL (ref >39.00)
LDL CALC: 67 mg/dL (ref 0–99)
NonHDL: 84.3
TRIGLYCERIDES: 88 mg/dL (ref 0.0–149.0)
Total CHOL/HDL Ratio: 3
VLDL: 17.6 mg/dL (ref 0.0–40.0)

## 2014-09-15 LAB — HEPATIC FUNCTION PANEL
ALBUMIN: 3.7 g/dL (ref 3.5–5.2)
ALT: 9 U/L (ref 0–53)
AST: 15 U/L (ref 0–37)
Alkaline Phosphatase: 55 U/L (ref 39–117)
Bilirubin, Direct: 0 mg/dL (ref 0.0–0.3)
TOTAL PROTEIN: 7 g/dL (ref 6.0–8.3)
Total Bilirubin: 0.5 mg/dL (ref 0.2–1.2)

## 2014-09-15 LAB — ALT: ALT: 9 U/L (ref 0–53)

## 2014-09-16 ENCOUNTER — Ambulatory Visit: Payer: Medicare Other | Admitting: Pharmacist

## 2014-09-22 DIAGNOSIS — Z96659 Presence of unspecified artificial knee joint: Secondary | ICD-10-CM | POA: Diagnosis not present

## 2014-09-22 DIAGNOSIS — M1710 Unilateral primary osteoarthritis, unspecified knee: Secondary | ICD-10-CM | POA: Diagnosis not present

## 2014-10-09 DIAGNOSIS — Z23 Encounter for immunization: Secondary | ICD-10-CM | POA: Diagnosis not present

## 2014-10-09 DIAGNOSIS — E780 Pure hypercholesterolemia: Secondary | ICD-10-CM | POA: Diagnosis not present

## 2014-10-09 DIAGNOSIS — I2510 Atherosclerotic heart disease of native coronary artery without angina pectoris: Secondary | ICD-10-CM | POA: Diagnosis not present

## 2014-10-09 DIAGNOSIS — M150 Primary generalized (osteo)arthritis: Secondary | ICD-10-CM | POA: Diagnosis not present

## 2014-10-09 DIAGNOSIS — I10 Essential (primary) hypertension: Secondary | ICD-10-CM | POA: Diagnosis not present

## 2014-10-10 ENCOUNTER — Encounter: Payer: Self-pay | Admitting: Cardiology

## 2014-10-10 ENCOUNTER — Other Ambulatory Visit: Payer: Self-pay

## 2014-10-10 ENCOUNTER — Ambulatory Visit (INDEPENDENT_AMBULATORY_CARE_PROVIDER_SITE_OTHER): Payer: Medicare Other | Admitting: Cardiology

## 2014-10-10 VITALS — BP 142/80 | HR 90 | Ht 71.0 in | Wt 264.0 lb

## 2014-10-10 DIAGNOSIS — I10 Essential (primary) hypertension: Secondary | ICD-10-CM | POA: Diagnosis not present

## 2014-10-10 DIAGNOSIS — I951 Orthostatic hypotension: Secondary | ICD-10-CM | POA: Diagnosis not present

## 2014-10-10 DIAGNOSIS — E7800 Pure hypercholesterolemia, unspecified: Secondary | ICD-10-CM

## 2014-10-10 DIAGNOSIS — I2510 Atherosclerotic heart disease of native coronary artery without angina pectoris: Secondary | ICD-10-CM | POA: Diagnosis not present

## 2014-10-10 NOTE — Progress Notes (Signed)
[address] [place], [place]  [address] Phone: [phone] Fax:  [phone]  Date:  10/10/2014   ID:  ZAKK BORGEN, DOB 1946-11-01, MRN [id number]  PCP:  Donnie Coffin, MD  Cardiologist:  Fransico Him, MD    History of Present Illness: Christian [name] is a 68 y.o. male with a history of ASCAD, HTN and dyslipidemia who presents today for followup. He is doing well. He denies any chest pain, SOB, DOE, LE edema, dizziness, palpitations or syncope.  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     Wt Readings from Last 3 Encounters:  10/10/14 264 lb (119.75 kg)  05/07/14 277 lb (125.646 kg)  05/07/14 277 lb (125.646 kg)     Past Medical History  Diagnosis Date  . Orthostatic hypotension   . Sciatica   . Coronary artery disease 3/12    80% stenosis of diagonal too small for PCI  . Hypercholesterolemia     takes Crestor daily  . Diastolic dysfunction   . Hypertension     takes Imdur and Lisinopril daily  . Joint pain   . Joint swelling   . Hemorrhoids   . Cataracts, bilateral     immature  . Heart murmur   . Arthritis     "joints; shoulders; left elbow; right thumb" (05/06/2014)    Current Outpatient Prescriptions  Medication Sig Dispense Refill  . Blood Pressure Monitoring (5 SERIES BP MONITOR) DEVI       . isosorbide mononitrate (IMDUR) 30 MG 24 hr tablet TAKE 1 TABLET BY MOUTH ONCE DAILY  90 tablet  0  . lisinopril (PRINIVIL,ZESTRIL) 10 MG tablet Take 10 mg by mouth daily.      . methocarbamol (ROBAXIN) 500 MG tablet Take 1 tablet (500 mg total) by mouth every 8 (eight) hours as needed for muscle spasms.  40 tablet  0  . nitroGLYCERIN (NITROSTAT) 0.4 MG SL tablet Place 0.4 mg under the tongue every 5 (five) minutes as needed for chest pain.      [name] Kitchen oxyCODONE (OXY IR/ROXICODONE) 5 MG immediate release tablet Take 1-2 tablets (5-10 mg total) by mouth every 4 (four) hours as needed for  moderate pain, severe pain or breakthrough pain.  90 tablet  0  . psyllium (METAMUCIL SMOOTH TEXTURE) 28 % packet Take 1 packet by mouth 2 (two) times daily.      . rosuvastatin (CRESTOR) 10 MG tablet Take 10 mg by mouth daily.       No current facility-administered medications for this visit.    Allergies:    Allergies  Allergen Reactions  . Atorvastatin Calcium [Atorvastatin] Other (See Comments)    Myalgia  . Pravastatin Other (See Comments)    Leg Cramps  . Simvastatin Other (See Comments)    Myalgia    Social History:  The patient  reports that he has quit smoking. His smoking use included Cigarettes. He has a 20 pack-year smoking history. He has never used smokeless tobacco. He reports that he drinks alcohol. He reports that he does not use illicit drugs.   Family History:  The patient's family history includes CVA in his father and mother; Heart disease in his father.   ROS:  Please see the history of present illness.      All other systems reviewed and negative.   PHYSICAL EXAM: VS:  BP 142/80  Pulse 90  Ht  5\' 11"  (1.803 m)  Wt 264 lb (119.75 kg)  BMI 36.84 kg/m2 Well nourished, well developed, in no acute distress HEENT: normal Neck: no JVD Cardiac:  normal S1, S2; RRR; no murmur Lungs:  clear to auscultation bilaterally, no wheezing, rhonchi or rales Abd: soft, nontender, no hepatomegaly Ext: no edema Skin: warm and dry Neuro:  CNs 2-12 intact, no focal abnormalities noted  EKG:  NSR with PAC's, IRBBB   ASSESSMENT/PLAN:  1. ASCAD with no angina  - continue ASA/Imdur  2. HTN - controlled  - he will continue on Lisinopril  3. Dyslipidemia - at goal at 57 - continue Crestor  4. Orthostatic hypotension - fairly well controlled  5. Asymptomatic bradycardia resolved   Followup with me in 6 months     Signed, Fransico Him, MD [facility] 10/10/2014 8:03 AM

## 2014-10-10 NOTE — Patient Instructions (Signed)
Your physician recommends that you continue on your current medications as directed. Please refer to the Current Medication list given to you today.  Your physician wants you to follow-up in: 6 months with Dr Turner You will receive a reminder letter in the mail two months in advance. If you don't receive a letter, please call our office to schedule the follow-up appointment.  

## 2014-10-20 ENCOUNTER — Telehealth: Payer: Self-pay | Admitting: Cardiology

## 2014-10-20 DIAGNOSIS — E7800 Pure hypercholesterolemia, unspecified: Secondary | ICD-10-CM

## 2014-10-20 NOTE — Telephone Encounter (Signed)
New message          Pt would like to know if he can get blood work and lipid clinic appt with Gay Filler in 5 months

## 2014-10-20 NOTE — Telephone Encounter (Signed)
Spoke with pt.  Labs set up in April.

## 2014-11-20 ENCOUNTER — Other Ambulatory Visit: Payer: Self-pay | Admitting: Cardiology

## 2014-11-28 DIAGNOSIS — J069 Acute upper respiratory infection, unspecified: Secondary | ICD-10-CM | POA: Diagnosis not present

## 2015-01-22 ENCOUNTER — Encounter: Payer: Self-pay | Admitting: Cardiology

## 2015-03-27 ENCOUNTER — Other Ambulatory Visit: Payer: Self-pay | Admitting: Cardiology

## 2015-04-01 ENCOUNTER — Other Ambulatory Visit (INDEPENDENT_AMBULATORY_CARE_PROVIDER_SITE_OTHER): Payer: Medicare Other | Admitting: *Deleted

## 2015-04-01 DIAGNOSIS — E7800 Pure hypercholesterolemia, unspecified: Secondary | ICD-10-CM

## 2015-04-01 LAB — HEPATIC FUNCTION PANEL
ALBUMIN: 3.7 g/dL (ref 3.5–5.2)
ALT: 6 U/L (ref 0–53)
AST: 13 U/L (ref 0–37)
Alkaline Phosphatase: 53 U/L (ref 39–117)
BILIRUBIN TOTAL: 0.6 mg/dL (ref 0.2–1.2)
Bilirubin, Direct: 0.1 mg/dL (ref 0.0–0.3)
Total Protein: 6.5 g/dL (ref 6.0–8.3)

## 2015-04-01 LAB — LIPID PANEL
Cholesterol: 124 mg/dL (ref 0–200)
HDL: 44.7 mg/dL (ref >39.00)
LDL Cholesterol: 68 mg/dL (ref 0–99)
NonHDL: 79.3
TRIGLYCERIDES: 55 mg/dL (ref 0.0–149.0)
Total CHOL/HDL Ratio: 3
VLDL: 11 mg/dL (ref 0.0–40.0)

## 2015-04-08 NOTE — Progress Notes (Signed)
Cardiology Office Note   Date:  04/09/2015   ID:  JOURDEN GILSON, DOB Aug 03, 1946, MRN [id number]  PCP:  Donnie Coffin, MD    Chief Complaint  Patient presents with  . Coronary Artery Disease  . Hypertension  . Hyperlipidemia      History of Present Illness: Christian [name] is a 69 y.o. male with a history of ASCAD, HTN and dyslipidemia who presents today for followup. He is doing well. He denies any chest pain, SOB, DOE, LE edema, dizziness, palpitations or syncope. He has not been riding his bike due to knee pain.   Past Medical History  Diagnosis Date  . Orthostatic hypotension   . Sciatica   . Coronary artery disease 3/12    80% stenosis of diagonal too small for PCI  . Hypercholesterolemia     takes Crestor daily  . Diastolic dysfunction   . Joint pain   . Joint swelling   . Hemorrhoids   . Cataracts, bilateral     immature  . Heart murmur   . Arthritis     "joints; shoulders; left elbow; right thumb" (05/06/2014)  . Hypertension     Past Surgical History  Procedure Laterality Date  . Heel spur excision Left   . Toe fusion Right     great toe; plates and screws  . Shoulder surgery Right X 2    "cleaned out spurs"  . Knee arthroscopy Right   . Achilles tendon repair Left     "& fixed a spur"  . Flexible sigmoidoscopy  X 2  . Hand surgery Left     "thumb; cleaned out arthritis"  . Cardiac catheterization  2012  . Colonoscopy    . Total knee arthroplasty Right 05/06/2014  . Tonsillectomy  ~ 1950  . Total knee arthroplasty Right 05/06/2014    Procedure: RIGHT TOTAL KNEE ARTHROPLASTY;  Surgeon: Garald Balding, MD;  Location: [place];  Service: Orthopedics;  Laterality: Right;     Current Outpatient Prescriptions  Medication Sig Dispense Refill  . aspirin 81 MG tablet Take 81 mg by mouth daily.    . Blood Pressure Monitoring (5 SERIES BP MONITOR) DEVI     . CRESTOR 10 MG tablet TAKE 1 TABLET BY MOUTH DAILY 30 tablet 0  . isosorbide mononitrate  (IMDUR) 30 MG 24 hr tablet TAKE 1 TABLET BY MOUTH EVERY DAY 90 tablet 1  . lisinopril (PRINIVIL,ZESTRIL) 10 MG tablet Take 10 mg by mouth daily.    . methocarbamol (ROBAXIN) 500 MG tablet Take 1 tablet (500 mg total) by mouth every 8 (eight) hours as needed for muscle spasms. 40 tablet 0  . nitroGLYCERIN (NITROSTAT) 0.4 MG SL tablet Place 0.4 mg under the tongue every 5 (five) minutes as needed for chest pain.    [name] Kitchen oxyCODONE (OXY IR/ROXICODONE) 5 MG immediate release tablet Take 1-2 tablets (5-10 mg total) by mouth every 4 (four) hours as needed for moderate pain, severe pain or breakthrough pain. 90 tablet 0  . psyllium (METAMUCIL SMOOTH TEXTURE) 28 % packet Take 1 packet by mouth 2 (two) times daily.    . rosuvastatin (CRESTOR) 10 MG tablet Take 10 mg by mouth daily.     No current facility-administered medications for this visit.    Allergies:   Atorvastatin calcium; Pravastatin; and Simvastatin    Social History:  The patient  reports that he has quit smoking. His smoking use included Cigarettes. He has a 20 pack-year smoking history. He has  never used smokeless tobacco. He reports that he drinks alcohol. He reports that he does not use illicit drugs.   Family History:  The patient's family history includes CVA in his father and mother; Heart disease in his father.    ROS:  Please see the history of present illness.   Otherwise, review of systems are positive for none.   All other systems are reviewed and negative.    PHYSICAL EXAM: VS:  BP 124/92 mmHg  Pulse 66  Ht 5\' 11"  (1.803 m)  Wt 281 lb 1.9 oz (127.515 kg)  BMI 39.23 kg/m2  SpO2 97% , BMI Body mass index is 39.23 kg/(m^2). GEN: Well nourished, well developed, in no acute distress HEENT: normal Neck: no JVD, carotid bruits, or masses Cardiac: RRR; no murmurs, rubs, or gallops,no edema  Respiratory:  clear to auscultation bilaterally, normal work of breathing GI: soft, nontender, nondistended, + BS MS: no deformity or  atrophy Skin: warm and dry, no rash Neuro:  Strength and sensation are intact Psych: euthymic mood, full affect   EKG:  EKG is not ordered today.    Recent Labs: 05/08/2014: BUN 10; Creatinine 1.12; Hemoglobin 11.3*; Platelets 154; Potassium 4.9; Sodium 140 04/01/2015: ALT 6    Lipid Panel    Component Value Date/Time   CHOL 124 04/01/2015 0810   TRIG 55.0 04/01/2015 0810   HDL 44.70 04/01/2015 0810   CHOLHDL 3 04/01/2015 0810   VLDL 11.0 04/01/2015 0810   LDLCALC 68 04/01/2015 0810      Wt Readings from Last 3 Encounters:  04/09/15 281 lb 1.9 oz (127.515 kg)  10/10/14 264 lb (119.75 kg)  05/07/14 277 lb (125.646 kg)    ASSESSMENT/PLAN:  1. ASCAD with no angina  - continue ASA/Imdur  2. HTN - controlled  - he will continue on Lisinopril  - check BMET 3. Dyslipidemia - at goal at 32 - continue Crestor  - encouraged to try to get back into an exercise routine as his knee pain allows 4. Orthostatic hypotension - fairly well controlled  5. Asymptomatic bradycardia resolved     Current medicines are reviewed at length with the patient today.  The patient does not have concerns regarding medicines.  The following changes have been made:  no change  Labs/ tests ordered today include: see above assessment and plan  Orders Placed This Encounter  Procedures  . Basic Metabolic Panel (BMET)     Disposition:   FU with me in 6 months   Signed, Sueanne Margarita, MD  04/09/2015 9:34 AM    [facility] [place], [place], [place]  [address] Phone: [phone]; Fax: [phone]

## 2015-04-09 ENCOUNTER — Encounter: Payer: Self-pay | Admitting: Cardiology

## 2015-04-09 ENCOUNTER — Ambulatory Visit (INDEPENDENT_AMBULATORY_CARE_PROVIDER_SITE_OTHER): Payer: Medicare Other | Admitting: Cardiology

## 2015-04-09 VITALS — BP 124/92 | HR 66 | Ht 71.0 in | Wt 281.1 lb

## 2015-04-09 DIAGNOSIS — I10 Essential (primary) hypertension: Secondary | ICD-10-CM | POA: Diagnosis not present

## 2015-04-09 DIAGNOSIS — E7800 Pure hypercholesterolemia, unspecified: Secondary | ICD-10-CM

## 2015-04-09 DIAGNOSIS — E669 Obesity, unspecified: Secondary | ICD-10-CM

## 2015-04-09 DIAGNOSIS — I2510 Atherosclerotic heart disease of native coronary artery without angina pectoris: Secondary | ICD-10-CM

## 2015-04-09 DIAGNOSIS — I2583 Coronary atherosclerosis due to lipid rich plaque: Principal | ICD-10-CM

## 2015-04-09 DIAGNOSIS — I951 Orthostatic hypotension: Secondary | ICD-10-CM

## 2015-04-09 LAB — BASIC METABOLIC PANEL
BUN: 22 mg/dL (ref 6–23)
CO2: 27 mEq/L (ref 19–32)
Calcium: 9.3 mg/dL (ref 8.4–10.5)
Chloride: 106 mEq/L (ref 96–112)
Creatinine, Ser: 1.23 mg/dL (ref 0.40–1.50)
GFR: 62.19 mL/min (ref >60.00)
GLUCOSE: 91 mg/dL (ref 70–99)
Potassium: 4.6 mEq/L (ref 3.5–5.1)
Sodium: 137 mEq/L (ref 135–145)

## 2015-04-09 MED ORDER — ROSUVASTATIN CALCIUM 10 MG PO TABS: 10.0000 mg | ORAL_TABLET | Freq: Every day | ORAL | Status: DC

## 2015-04-09 NOTE — Patient Instructions (Signed)

## 2015-04-16 DIAGNOSIS — M25561 Pain in right knee: Secondary | ICD-10-CM | POA: Diagnosis not present

## 2015-04-16 DIAGNOSIS — M1611 Unilateral primary osteoarthritis, right hip: Secondary | ICD-10-CM | POA: Diagnosis not present

## 2015-04-16 DIAGNOSIS — M25551 Pain in right hip: Secondary | ICD-10-CM | POA: Diagnosis not present

## 2015-04-16 DIAGNOSIS — M1711 Unilateral primary osteoarthritis, right knee: Secondary | ICD-10-CM | POA: Diagnosis not present

## 2015-04-22 DIAGNOSIS — H04123 Dry eye syndrome of bilateral lacrimal glands: Secondary | ICD-10-CM | POA: Diagnosis not present

## 2015-04-22 DIAGNOSIS — H40013 Open angle with borderline findings, low risk, bilateral: Secondary | ICD-10-CM | POA: Diagnosis not present

## 2015-04-22 DIAGNOSIS — H47329 Drusen of optic disc, unspecified eye: Secondary | ICD-10-CM | POA: Diagnosis not present

## 2015-04-22 DIAGNOSIS — H2513 Age-related nuclear cataract, bilateral: Secondary | ICD-10-CM | POA: Diagnosis not present

## 2015-04-27 DIAGNOSIS — M1611 Unilateral primary osteoarthritis, right hip: Secondary | ICD-10-CM | POA: Diagnosis not present

## 2015-04-27 DIAGNOSIS — M25551 Pain in right hip: Secondary | ICD-10-CM | POA: Diagnosis not present

## 2015-05-14 ENCOUNTER — Other Ambulatory Visit: Payer: Self-pay | Admitting: Cardiology

## 2015-05-26 DIAGNOSIS — M25561 Pain in right knee: Secondary | ICD-10-CM | POA: Diagnosis not present

## 2015-05-26 DIAGNOSIS — M170 Bilateral primary osteoarthritis of knee: Secondary | ICD-10-CM | POA: Diagnosis not present

## 2015-05-26 DIAGNOSIS — M25551 Pain in right hip: Secondary | ICD-10-CM | POA: Diagnosis not present

## 2015-06-22 ENCOUNTER — Other Ambulatory Visit: Payer: Self-pay

## 2015-07-02 DIAGNOSIS — M1611 Unilateral primary osteoarthritis, right hip: Secondary | ICD-10-CM | POA: Diagnosis not present

## 2015-07-08 ENCOUNTER — Encounter: Payer: Self-pay | Admitting: Cardiology

## 2015-07-09 DIAGNOSIS — I2510 Atherosclerotic heart disease of native coronary artery without angina pectoris: Secondary | ICD-10-CM | POA: Diagnosis not present

## 2015-07-09 DIAGNOSIS — M150 Primary generalized (osteo)arthritis: Secondary | ICD-10-CM | POA: Diagnosis not present

## 2015-07-09 DIAGNOSIS — E780 Pure hypercholesterolemia: Secondary | ICD-10-CM | POA: Diagnosis not present

## 2015-07-09 DIAGNOSIS — Z125 Encounter for screening for malignant neoplasm of prostate: Secondary | ICD-10-CM | POA: Diagnosis not present

## 2015-07-09 DIAGNOSIS — I10 Essential (primary) hypertension: Secondary | ICD-10-CM | POA: Diagnosis not present

## 2015-07-09 DIAGNOSIS — G2581 Restless legs syndrome: Secondary | ICD-10-CM | POA: Diagnosis not present

## 2015-07-22 ENCOUNTER — Ambulatory Visit: Payer: Medicare Other | Admitting: Cardiology

## 2015-07-29 ENCOUNTER — Encounter: Payer: Self-pay | Admitting: Cardiology

## 2015-08-09 ENCOUNTER — Other Ambulatory Visit: Payer: Self-pay | Admitting: Cardiology

## 2015-08-18 DIAGNOSIS — H47329 Drusen of optic disc, unspecified eye: Secondary | ICD-10-CM | POA: Diagnosis not present

## 2015-08-18 DIAGNOSIS — H40013 Open angle with borderline findings, low risk, bilateral: Secondary | ICD-10-CM | POA: Diagnosis not present

## 2015-09-09 DIAGNOSIS — M1611 Unilateral primary osteoarthritis, right hip: Secondary | ICD-10-CM | POA: Diagnosis not present

## 2015-09-10 ENCOUNTER — Encounter (HOSPITAL_COMMUNITY): Payer: Self-pay

## 2015-09-10 NOTE — Progress Notes (Addendum)
Cardiologist is Dr.Traci Turner with last office visit in epic from 04-08-15  Echo/stress test/heart cath in epic from 2012  EKG in epic from 10-10-14  Medical MD is Dr.Dean Alroy Dust  CXR denies in past yr

## 2015-09-10 NOTE — Pre-Procedure Instructions (Signed)
Christian [name]  09/10/2015      [id number] - [place], [address] AT [facility] [facility] [address] [place] [address] Phone: [phone] Fax: [phone]    Your procedure is scheduled on Tues, Sept 27 @ 7:15 AM  Report to [facility] Admitting at 5:30 AM  Call this number if you have problems the morning of surgery:  [phone]   Remember:  Do not eat food or drink liquids after midnight.  Take these medicines the morning of surgery with A SIP OF WATER Imdur(Isosorbide) and Pain Pill(if needed)              Stop taking your Aspirin a week prior to surgery. No Goody's,BC's,Aleve,Ibuprofen,Fish Oil,or any Herbal Medications.    Do not wear jewelry.  Do not wear lotions, powders, or colognes.  You may wear deodorant.             Men may shave face and neck.  Do not bring valuables to the hospital.  [facility] is not responsible for any belongings or valuables.  Contacts, dentures or bridgework may not be worn into surgery.  Leave your suitcase in the car.  After surgery it may be brought to your room.  For patients admitted to the hospital, discharge time will be determined by your treatment team.  Patients discharged the day of surgery will not be allowed to drive home.    Special instructions:  [facility] - Preparing for Surgery  Before surgery, you can play an important role.  Because skin is not sterile, your skin needs to be as free of germs as possible.  You can reduce the number of germs on you skin by washing with CHG (chlorahexidine gluconate) soap before surgery.  CHG is an antiseptic cleaner which kills germs and bonds with the skin to continue killing germs even after washing.  Please DO NOT use if you have an allergy to CHG or antibacterial soaps.  If your skin becomes reddened/irritated stop using the CHG and inform your nurse when you arrive at Short Stay.  Do not shave (including legs and  underarms) for at least 48 hours prior to the first CHG shower.  You may shave your face.  Please follow these instructions carefully:   1.  Shower with CHG Soap the night before surgery and the                                morning of Surgery.  2.  If you choose to wash your hair, wash your hair first as usual with your       normal shampoo.  3.  After you shampoo, rinse your hair and body thoroughly to remove the                      Shampoo.  4.  Use CHG as you would any other liquid soap.  You can apply chg directly       to the skin and wash gently with scrungie or a clean washcloth.  5.  Apply the CHG Soap to your body ONLY FROM THE NECK DOWN.        Do not use on open wounds or open sores.  Avoid contact with your eyes,       ears, mouth and genitals (private parts).  Wash genitals (  private parts)       with your normal soap.  6.  Wash thoroughly, paying special attention to the area where your surgery        will be performed.  7.  Thoroughly rinse your body with warm water from the neck down.  8.  DO NOT shower/wash with your normal soap after using and rinsing off       the CHG Soap.  9.  Pat yourself dry with a clean towel.            10.  Wear clean pajamas.            11.  Place clean sheets on your bed the night of your first shower and do not        sleep with pets.  Day of Surgery  Do not apply any lotions/deoderants the morning of surgery.  Please wear clean clothes to the hospital/surgery center.    Please read over the following fact sheets that you were given. Pain Booklet, Coughing and Deep Breathing, Blood Transfusion Information, MRSA Information and Surgical Site Infection Prevention

## 2015-09-11 ENCOUNTER — Encounter (HOSPITAL_COMMUNITY)
Admission: RE | Admit: 2015-09-11 | Discharge: 2015-09-11 | Disposition: A | Discharge status: Home / Self Care | Payer: Medicare Other | Source: Ambulatory Visit | Attending: Orthopedic Surgery | Admitting: Orthopedic Surgery

## 2015-09-11 ENCOUNTER — Encounter (HOSPITAL_COMMUNITY)
Admission: RE | Admit: 2015-09-11 | Discharge: 2015-09-11 | Disposition: A | Discharge status: Home / Self Care | Payer: Medicare Other | Source: Ambulatory Visit | Attending: Orthopaedic Surgery | Admitting: Orthopaedic Surgery

## 2015-09-11 ENCOUNTER — Encounter (HOSPITAL_COMMUNITY): Payer: Self-pay

## 2015-09-11 DIAGNOSIS — Z0183 Encounter for blood typing: Secondary | ICD-10-CM | POA: Diagnosis not present

## 2015-09-11 DIAGNOSIS — I517 Cardiomegaly: Secondary | ICD-10-CM | POA: Diagnosis not present

## 2015-09-11 DIAGNOSIS — Z01818 Encounter for other preprocedural examination: Secondary | ICD-10-CM | POA: Diagnosis not present

## 2015-09-11 DIAGNOSIS — Z87891 Personal history of nicotine dependence: Secondary | ICD-10-CM | POA: Insufficient documentation

## 2015-09-11 DIAGNOSIS — Z01812 Encounter for preprocedural laboratory examination: Secondary | ICD-10-CM | POA: Insufficient documentation

## 2015-09-11 DIAGNOSIS — J9811 Atelectasis: Secondary | ICD-10-CM | POA: Diagnosis not present

## 2015-09-11 DIAGNOSIS — I2510 Atherosclerotic heart disease of native coronary artery without angina pectoris: Secondary | ICD-10-CM | POA: Insufficient documentation

## 2015-09-11 DIAGNOSIS — E785 Hyperlipidemia, unspecified: Secondary | ICD-10-CM | POA: Insufficient documentation

## 2015-09-11 DIAGNOSIS — M1611 Unilateral primary osteoarthritis, right hip: Secondary | ICD-10-CM | POA: Diagnosis not present

## 2015-09-11 DIAGNOSIS — Z7982 Long term (current) use of aspirin: Secondary | ICD-10-CM | POA: Insufficient documentation

## 2015-09-11 DIAGNOSIS — Z96651 Presence of right artificial knee joint: Secondary | ICD-10-CM | POA: Insufficient documentation

## 2015-09-11 DIAGNOSIS — I10 Essential (primary) hypertension: Secondary | ICD-10-CM | POA: Insufficient documentation

## 2015-09-11 DIAGNOSIS — Z79899 Other long term (current) drug therapy: Secondary | ICD-10-CM | POA: Diagnosis not present

## 2015-09-11 HISTORY — DX: Constipation, unspecified: K59.00

## 2015-09-11 HISTORY — DX: Nocturia: R35.1

## 2015-09-11 LAB — SURGICAL PCR SCREEN
MRSA, PCR: NEGATIVE
Staphylococcus aureus: NEGATIVE

## 2015-09-11 LAB — CBC WITH DIFFERENTIAL/PLATELET
Basophils Absolute: 0 10*3/uL (ref 0.0–0.1)
Basophils Relative: 1 %
EOS ABS: 0.3 10*3/uL (ref 0.0–0.7)
EOS PCT: 3 %
HCT: 40.7 % (ref 39.0–52.0)
Hemoglobin: 13.7 g/dL (ref 13.0–17.0)
LYMPHS ABS: 2.1 10*3/uL (ref 0.7–4.0)
LYMPHS PCT: 26 %
MCH: 31.2 pg (ref 26.0–34.0)
MCHC: 33.7 g/dL (ref 30.0–36.0)
MCV: 92.7 fL (ref 78.0–100.0)
MONO ABS: 0.4 10*3/uL (ref 0.1–1.0)
Monocytes Relative: 5 %
Neutro Abs: 5.3 10*3/uL (ref 1.7–7.7)
Neutrophils Relative %: 65 %
PLATELETS: 220 10*3/uL (ref 150–400)
RBC: 4.39 MIL/uL (ref 4.22–5.81)
RDW: 13.4 % (ref 11.5–15.5)
WBC: 8.2 10*3/uL (ref 4.0–10.5)

## 2015-09-11 LAB — APTT: APTT: 28 s (ref 24–37)

## 2015-09-11 LAB — COMPREHENSIVE METABOLIC PANEL
ALT: 9 U/L — ABNORMAL LOW (ref 17–63)
ANION GAP: 7 (ref 5–15)
AST: 20 U/L (ref 15–41)
Albumin: 3.8 g/dL (ref 3.5–5.0)
Alkaline Phosphatase: 52 U/L (ref 38–126)
BUN: 10 mg/dL (ref 6–20)
CHLORIDE: 107 mmol/L (ref 101–111)
CO2: 25 mmol/L (ref 22–32)
Calcium: 9.2 mg/dL (ref 8.9–10.3)
Creatinine, Ser: 1.15 mg/dL (ref 0.61–1.24)
Glucose, Bld: 92 mg/dL (ref 65–99)
POTASSIUM: 4.4 mmol/L (ref 3.5–5.1)
SODIUM: 139 mmol/L (ref 135–145)
Total Bilirubin: 0.8 mg/dL (ref 0.3–1.2)
Total Protein: 6.6 g/dL (ref 6.5–8.1)

## 2015-09-11 LAB — URINALYSIS, ROUTINE W REFLEX MICROSCOPIC
Bilirubin Urine: NEGATIVE
GLUCOSE, UA: NEGATIVE mg/dL
HGB URINE DIPSTICK: NEGATIVE
KETONES UR: NEGATIVE mg/dL
LEUKOCYTES UA: NEGATIVE
Nitrite: NEGATIVE
PH: 6.5 (ref 5.0–8.0)
Protein, ur: NEGATIVE mg/dL
Specific Gravity, Urine: 1.01 (ref 1.005–1.030)
Urobilinogen, UA: 0.2 mg/dL (ref 0.0–1.0)

## 2015-09-11 LAB — TYPE AND SCREEN
ABO/RH(D): O POS
ANTIBODY SCREEN: NEGATIVE

## 2015-09-11 LAB — PROTIME-INR
INR: 1.08 (ref 0.00–1.49)
PROTHROMBIN TIME: 14.2 s (ref 11.6–15.2)

## 2015-09-11 MED ORDER — CHLORHEXIDINE GLUCONATE 4 % EX LIQD: 60.0000 mL | Freq: Once | CUTANEOUS | Status: DC

## 2015-09-11 NOTE — Progress Notes (Signed)
   09/11/15 0927  OBSTRUCTIVE SLEEP APNEA  Score 5 or greater  Results sent to PCP

## 2015-09-11 NOTE — H&P (Addendum)
CHIEF COMPLAINT:  Painful right hip.   HISTORY OF PRESENT ILLNESS:  Ferd is a very pleasant 69 year old white male who is seen today for evaluation of his right hip and groin pain.  He states this started previously, but was evaluated on April 16, 2015.  It was noted that he had pain in his groin and had previously been told he had spurs in his hip.  At that time x-rays were obtained, which showed the hip with decreased joint space and osteophytes and periarticular spurring.  He then was seen by Dr. Ernestina Patches on May 2 and underwent a right hip cortisone injection.  He overall was doing well and was taking occasionally Advil.  He had gotten to the point to where he was comfortable.  However, in July of 2016 he started having increasing pain.  He got to the point where he was having problems with ambulation and pain at nighttime.  He also noted that he does not have as much range of motion of the hip.  He is having problems with his activities of daily living.  He comes in today for re-evaluation.     PAST MEDICAL/SURGICAL HISTORY:  In general his health is good.  Right total knee replacement in 2015, 2010 left Achilles tendon repair, 2001 fusion of the right great toe, 1995 right shoulder SAD.     CURRENT MEDICATIONS:  Crestor 10 mg daily, isosorbide 30 mg daily, lisinopril 10 mg daily, baby aspirin 81 mg daily, Metamucil 1 tablespoon daily, stool softener 100 mg daily, ibuprofen 200 mg p.r.n.     ALLERGIES:  NO KNOWN DRUG ALLERGIES.    REVIEW OF SYSTEMS:  A 14-point is unremarkable except for hypertension and glasses.  His hypertension has been for the last 10-12 years and is treated with medications.   FAMILY HISTORY:  Positive for mother who died at 62 years old in a nursing facility after a stroke.  Apparently she just fell asleep and passed on.  Father had died of a stroke at age 60.  He had heart disease and a heart attack as well as gout and hypertension.  He has no brothers.  He has a sister at 82  and does have arthritis.     SOCIAL HISTORY:  He is a 69 year old white married male who is retired from the [employer].  He quit smoking cigarettes in 1981 and had 2 packs per day for 10 years.  He does occasionally have a beer.     PHYSICAL EXAMINATION:  In general his health is good.  Height is 70.5 inches, weight is 272 pounds, BMI is 38.5.  Vital signs reveals a temperature of 98.0, pulse 64, respirations 14, blood pressure 104/70.  Head is normocephalic.  Eyes:  Pupils are equal, round, and react to light and accommodation with extraocular movements intact.  ENT:  Benign.  Chest had good expansion.  Lungs were essentially clear.  Cardiac had a regular rhythm and rate, normal S1 and S2, distant heart sounds with an occasional ectopic.  Pulses were 2+ bilateral and symmetric in the lower extremities.  Abdomen is obese, soft, nontender, no mass palpable, normal bowel sounds present.  CNS:  He is oriented x3, cranial nerves II-XII grossly intact.  Musculoskeletal:  He does lack a few degrees of extension in the total knee.  He however with flexion of the hip to about 80 degrees he has minimal internal/external rotation.  He tends to sit with the leg in external rotation.  RADIOGRAPHS:  Studies reveal degenerative changes and bone on bone, more in the medial aspect of the femoral acetabular joint.  There is periarticular spurring  more inferiorly, but there is periarticular spurring in the femoral head.     CLINICAL IMPRESSION:   1.  Endstage OA of the right hip. 2.  Status post right total knee arthroplasty. 3.  History of hypertension. 4.  Exogenous obesity.   RECOMMENDATIONS:  At this time I have reviewed a clearance form from Dr. Alroy Dust who felt that he was a candidate for surgery from the medical standpoint.  I also have 1 from [facility], which revealed they felt he was also cleared from a cardiac standpoint for any surgery.     Therefore, at this time we will proceed with right total  hip arthroplasty.  Procedure risks and benefits were fully explained to the patient in detail.  Appropriate models were used.  All questions were answered in detail and he was satisfied and will proceed with a total hip replacement in the very near future.   Mike Craze [place], [place] [phone]  09/21/2015 4:22 PM

## 2015-09-12 LAB — URINE CULTURE: CULTURE: NO GROWTH

## 2015-09-14 ENCOUNTER — Ambulatory Visit: Payer: Medicare Other | Admitting: Cardiology

## 2015-09-14 NOTE — Progress Notes (Addendum)
Anesthesia Chart Review:  Pt is 69 year old Christian [name] scheduled for R total hip arthroplasty on 09/22/2015 with Dr. Durward Fortes.   PCP is Dr. Donnie Coffin. Cardiologist is Dr. Fransico Him.   PMH includes: CAD (80% stenosis of diagonal too small for PCI), diastolic dysfunction, heart murmur, HTN, hyperlipidemia. Former smoker. BMI 38. S/p R TKA 05/06/14.   Medications include: ASA, imdur, lisinopril, crestor.   Preoperative labs reviewed.    EKG 10/10/2014: sinus rhythm with PACs in a pattern of bigeminy. Incomplete RBBB.   Cardiac cath 03/02/2011: 1. 80% small diagonal one too small for PCI. 2. Nonobstructive disease in left circumflex RCA and LAD. 3. Normal LV function. 4. Normal LVEDP.  Nuclear stress test 02/23/2011: -observed defect consistent with breast attenuation artifact. A mild perfusion defect is seen in the basal anterior regions. This is consistent with attenuation artifact.  -Post-stress EF is 68%. No regional wall motion abnormalities.  -Nondiagnostic EKG. Exercise capacity 5.0METS. Exercise capacity is normal -Abnormal myocardial perfusion study. This was essentially a low risk scan.   Echo 02/22/2011:  1. LV EF 68.9% 2. Moderate LA enlargement 3. Trace mitral valve regurgitation 4. Trivial tricuspid regurgitation 5. Analysis of mitral valve inflow, pulmonary vein doppler and tissue doppler suggests grade I diastolic dysfunction without elevated LA pressure.   Pt has cardiac clearance from [name] in media tab dated 07/06/15.  If no changes, I anticipate pt can proceed with surgery as scheduled.   Willeen Cass, FNP-BC [facility]/Anesthesiology Phone: [phone] 09/14/2015 1:07 PM

## 2015-09-21 MED ORDER — SODIUM CHLORIDE 0.9 % IV SOLN: 75.0000 mL/h | INTRAVENOUS | Status: DC

## 2015-09-21 MED ORDER — CEFAZOLIN SODIUM 10 G IJ SOLR
3.0000 g | INTRAMUSCULAR | Status: AC
Start: 2015-09-22 — End: 2015-09-22
  Administered 2015-09-22: 3 g via INTRAVENOUS
  Filled 2015-09-21: qty 3000

## 2015-09-21 MED ORDER — ACETAMINOPHEN 10 MG/ML IV SOLN
1000.0000 mg | INTRAVENOUS | Status: DC
  Filled 2015-09-21: qty 100

## 2015-09-22 ENCOUNTER — Inpatient Hospital Stay (HOSPITAL_COMMUNITY): Payer: Medicare Other

## 2015-09-22 ENCOUNTER — Inpatient Hospital Stay (HOSPITAL_COMMUNITY): Payer: Medicare Other | Admitting: Anesthesiology

## 2015-09-22 ENCOUNTER — Encounter (HOSPITAL_COMMUNITY)
Admission: RE | Disposition: A | Discharge status: Home / Self Care | Payer: Self-pay | Source: Ambulatory Visit | Attending: Orthopaedic Surgery

## 2015-09-22 ENCOUNTER — Inpatient Hospital Stay (HOSPITAL_COMMUNITY): Payer: Medicare Other | Admitting: Emergency Medicine

## 2015-09-22 ENCOUNTER — Inpatient Hospital Stay (HOSPITAL_COMMUNITY)
Admission: RE | Admit: 2015-09-22 | Discharge: 2015-09-24 | DRG: 470 | Disposition: A | Discharge status: Home / Self Care | Payer: Medicare Other | Source: Ambulatory Visit | Attending: Orthopaedic Surgery | Admitting: Orthopaedic Surgery

## 2015-09-22 ENCOUNTER — Encounter (HOSPITAL_COMMUNITY): Payer: Self-pay | Admitting: *Deleted

## 2015-09-22 DIAGNOSIS — I10 Essential (primary) hypertension: Secondary | ICD-10-CM | POA: Diagnosis present

## 2015-09-22 DIAGNOSIS — E6609 Other obesity due to excess calories: Secondary | ICD-10-CM | POA: Diagnosis not present

## 2015-09-22 DIAGNOSIS — Z823 Family history of stroke: Secondary | ICD-10-CM | POA: Diagnosis not present

## 2015-09-22 DIAGNOSIS — Z8249 Family history of ischemic heart disease and other diseases of the circulatory system: Secondary | ICD-10-CM | POA: Diagnosis not present

## 2015-09-22 DIAGNOSIS — I517 Cardiomegaly: Secondary | ICD-10-CM | POA: Diagnosis present

## 2015-09-22 DIAGNOSIS — M169 Osteoarthritis of hip, unspecified: Secondary | ICD-10-CM | POA: Diagnosis not present

## 2015-09-22 DIAGNOSIS — Z7982 Long term (current) use of aspirin: Secondary | ICD-10-CM

## 2015-09-22 DIAGNOSIS — Z96649 Presence of unspecified artificial hip joint: Secondary | ICD-10-CM

## 2015-09-22 DIAGNOSIS — M1611 Unilateral primary osteoarthritis, right hip: Secondary | ICD-10-CM | POA: Diagnosis not present

## 2015-09-22 DIAGNOSIS — I2510 Atherosclerotic heart disease of native coronary artery without angina pectoris: Secondary | ICD-10-CM | POA: Diagnosis not present

## 2015-09-22 DIAGNOSIS — Z96641 Presence of right artificial hip joint: Secondary | ICD-10-CM | POA: Diagnosis not present

## 2015-09-22 DIAGNOSIS — Z6838 Body mass index (BMI) 38.0-38.9, adult: Secondary | ICD-10-CM | POA: Diagnosis not present

## 2015-09-22 DIAGNOSIS — Z9889 Other specified postprocedural states: Secondary | ICD-10-CM

## 2015-09-22 DIAGNOSIS — Z87891 Personal history of nicotine dependence: Secondary | ICD-10-CM

## 2015-09-22 DIAGNOSIS — Z471 Aftercare following joint replacement surgery: Secondary | ICD-10-CM | POA: Diagnosis not present

## 2015-09-22 DIAGNOSIS — Z96651 Presence of right artificial knee joint: Secondary | ICD-10-CM | POA: Diagnosis present

## 2015-09-22 DIAGNOSIS — Z79899 Other long term (current) drug therapy: Secondary | ICD-10-CM

## 2015-09-22 HISTORY — PX: TOTAL HIP ARTHROPLASTY: SHX124

## 2015-09-22 SURGERY — ARTHROPLASTY, HIP, TOTAL,POSTERIOR APPROACH
Anesthesia: General | Site: Hip | Laterality: Right

## 2015-09-22 MED ORDER — SODIUM CHLORIDE 0.9 % IJ SOLN
INTRAMUSCULAR | Status: AC
  Filled 2015-09-22: qty 10

## 2015-09-22 MED ORDER — HYDROMORPHONE HCL 1 MG/ML IJ SOLN
0.2500 mg | INTRAMUSCULAR | Status: DC | PRN
  Administered 2015-09-22 (×3): 0.5 mg via INTRAVENOUS

## 2015-09-22 MED ORDER — SUCCINYLCHOLINE CHLORIDE 20 MG/ML IJ SOLN
INTRAMUSCULAR | Status: DC | PRN
  Administered 2015-09-22: 100 mg via INTRAVENOUS

## 2015-09-22 MED ORDER — HYDROMORPHONE HCL 1 MG/ML IJ SOLN
INTRAMUSCULAR | Status: AC
  Administered 2015-09-22: 0.5 mg via INTRAVENOUS
  Filled 2015-09-22: qty 1

## 2015-09-22 MED ORDER — PROPOFOL 10 MG/ML IV BOLUS
INTRAVENOUS | Status: DC | PRN
  Administered 2015-09-22: 160 mg via INTRAVENOUS

## 2015-09-22 MED ORDER — EPHEDRINE SULFATE 50 MG/ML IJ SOLN
INTRAMUSCULAR | Status: AC
  Filled 2015-09-22: qty 1

## 2015-09-22 MED ORDER — PHENYLEPHRINE HCL 10 MG/ML IJ SOLN
INTRAMUSCULAR | Status: DC | PRN
  Administered 2015-09-22 (×4): 80 ug via INTRAVENOUS

## 2015-09-22 MED ORDER — ISOSORBIDE MONONITRATE ER 30 MG PO TB24
30.0000 mg | ORAL_TABLET | Freq: Every day | ORAL | Status: DC
  Administered 2015-09-23 – 2015-09-24 (×2): 30 mg via ORAL
  Filled 2015-09-22 (×2): qty 1

## 2015-09-22 MED ORDER — SODIUM CHLORIDE 0.9 % IV SOLN
75.0000 mL/h | INTRAVENOUS | Status: DC
  Administered 2015-09-22 – 2015-09-23 (×2): 75 mL/h via INTRAVENOUS

## 2015-09-22 MED ORDER — NEOSTIGMINE METHYLSULFATE 10 MG/10ML IV SOLN
INTRAVENOUS | Status: DC | PRN
Start: 2015-09-22 — End: 2015-09-22
  Administered 2015-09-22: 3 mg via INTRAVENOUS

## 2015-09-22 MED ORDER — ROCURONIUM BROMIDE 50 MG/5ML IV SOLN
INTRAVENOUS | Status: AC
Start: 2015-09-22 — End: 2015-09-22
  Filled 2015-09-22: qty 1

## 2015-09-22 MED ORDER — ALBUMIN HUMAN 5 % IV SOLN
INTRAVENOUS | Status: DC | PRN
  Administered 2015-09-22: 09:00:00 via INTRAVENOUS

## 2015-09-22 MED ORDER — BUPIVACAINE-EPINEPHRINE (PF) 0.25% -1:200000 IJ SOLN
INTRAMUSCULAR | Status: DC | PRN
  Administered 2015-09-22: 30 mL

## 2015-09-22 MED ORDER — DEXTROSE 5 % IV SOLN
10.0000 mg | INTRAVENOUS | Status: DC | PRN
  Administered 2015-09-22: 10 ug/min via INTRAVENOUS

## 2015-09-22 MED ORDER — ACETAMINOPHEN 10 MG/ML IV SOLN
1000.0000 mg | Freq: Four times a day (QID) | INTRAVENOUS | Status: AC
  Administered 2015-09-22 – 2015-09-23 (×4): 1000 mg via INTRAVENOUS
  Filled 2015-09-22 (×4): qty 100

## 2015-09-22 MED ORDER — BISACODYL 10 MG RE SUPP: 10.0000 mg | Freq: Every day | RECTAL | Status: DC | PRN

## 2015-09-22 MED ORDER — OXYCODONE HCL 5 MG PO TABS: 5.0000 mg | ORAL_TABLET | Freq: Once | ORAL | Status: DC | PRN

## 2015-09-22 MED ORDER — GLYCOPYRROLATE 0.2 MG/ML IJ SOLN
INTRAMUSCULAR | Status: DC | PRN
  Administered 2015-09-22: 0.4 mg via INTRAVENOUS

## 2015-09-22 MED ORDER — GLYCOPYRROLATE 0.2 MG/ML IJ SOLN
INTRAMUSCULAR | Status: AC
  Filled 2015-09-22: qty 2

## 2015-09-22 MED ORDER — SODIUM CHLORIDE 0.9 % IR SOLN
Status: DC | PRN
  Administered 2015-09-22: 1000 mL

## 2015-09-22 MED ORDER — TRANEXAMIC ACID 1000 MG/10ML IV SOLN
2000.0000 mg | INTRAVENOUS | Status: AC
  Administered 2015-09-22: 2000 mg via TOPICAL
  Filled 2015-09-22: qty 20

## 2015-09-22 MED ORDER — OXYCODONE HCL 5 MG/5ML PO SOLN: 5.0000 mg | Freq: Once | ORAL | Status: DC | PRN

## 2015-09-22 MED ORDER — CEFAZOLIN SODIUM-DEXTROSE 2-3 GM-% IV SOLR
2.0000 g | Freq: Four times a day (QID) | INTRAVENOUS | Status: AC
  Administered 2015-09-22 (×2): 2 g via INTRAVENOUS
  Filled 2015-09-22 (×2): qty 50

## 2015-09-22 MED ORDER — LIDOCAINE HCL (CARDIAC) 20 MG/ML IV SOLN
INTRAVENOUS | Status: AC
  Filled 2015-09-22: qty 5

## 2015-09-22 MED ORDER — BUPIVACAINE-EPINEPHRINE (PF) 0.25% -1:200000 IJ SOLN
INTRAMUSCULAR | Status: AC
  Filled 2015-09-22: qty 30

## 2015-09-22 MED ORDER — PHENOL 1.4 % MT LIQD: 1.0000 | OROMUCOSAL | Status: DC | PRN

## 2015-09-22 MED ORDER — LISINOPRIL 10 MG PO TABS
10.0000 mg | ORAL_TABLET | Freq: Every day | ORAL | Status: DC
  Administered 2015-09-23 – 2015-09-24 (×2): 10 mg via ORAL
  Filled 2015-09-22 (×2): qty 1

## 2015-09-22 MED ORDER — METOCLOPRAMIDE HCL 5 MG PO TABS: 5.0000 mg | ORAL_TABLET | Freq: Three times a day (TID) | ORAL | Status: DC | PRN

## 2015-09-22 MED ORDER — FENTANYL CITRATE (PF) 250 MCG/5ML IJ SOLN
INTRAMUSCULAR | Status: AC
  Filled 2015-09-22: qty 5

## 2015-09-22 MED ORDER — LIDOCAINE HCL (CARDIAC) 20 MG/ML IV SOLN
INTRAVENOUS | Status: DC | PRN
  Administered 2015-09-22: 60 mg via INTRAVENOUS

## 2015-09-22 MED ORDER — PHENYLEPHRINE 40 MCG/ML (10ML) SYRINGE FOR IV PUSH (FOR BLOOD PRESSURE SUPPORT)
PREFILLED_SYRINGE | INTRAVENOUS | Status: AC
  Filled 2015-09-22: qty 10

## 2015-09-22 MED ORDER — METOCLOPRAMIDE HCL 5 MG/ML IJ SOLN: 5.0000 mg | Freq: Three times a day (TID) | INTRAMUSCULAR | Status: DC | PRN

## 2015-09-22 MED ORDER — SUFENTANIL CITRATE 50 MCG/ML IV SOLN
INTRAVENOUS | Status: DC | PRN
  Administered 2015-09-22: 10 ug via INTRAVENOUS
  Administered 2015-09-22: 5 ug via INTRAVENOUS
  Administered 2015-09-22 (×2): 10 ug via INTRAVENOUS
  Administered 2015-09-22: 15 ug via INTRAVENOUS

## 2015-09-22 MED ORDER — RIVAROXABAN 10 MG PO TABS
10.0000 mg | ORAL_TABLET | Freq: Every day | ORAL | Status: DC
  Administered 2015-09-23 – 2015-09-24 (×2): 10 mg via ORAL
  Filled 2015-09-22 (×2): qty 1

## 2015-09-22 MED ORDER — EPHEDRINE SULFATE 50 MG/ML IJ SOLN
INTRAMUSCULAR | Status: DC | PRN
  Administered 2015-09-22 (×2): 5 mg via INTRAVENOUS
  Administered 2015-09-22 (×2): 10 mg via INTRAVENOUS

## 2015-09-22 MED ORDER — POLYETHYLENE GLYCOL 3350 17 G PO PACK: 17.0000 g | PACK | Freq: Every day | ORAL | Status: DC | PRN

## 2015-09-22 MED ORDER — ROSUVASTATIN CALCIUM 10 MG PO TABS
10.0000 mg | ORAL_TABLET | Freq: Every day | ORAL | Status: DC
  Administered 2015-09-22 – 2015-09-23 (×2): 10 mg via ORAL
  Filled 2015-09-22 (×2): qty 1

## 2015-09-22 MED ORDER — LACTATED RINGERS IV SOLN
INTRAVENOUS | Status: DC | PRN
  Administered 2015-09-22 (×3): via INTRAVENOUS

## 2015-09-22 MED ORDER — OXYCODONE HCL 5 MG PO TABS
5.0000 mg | ORAL_TABLET | ORAL | Status: DC | PRN
  Administered 2015-09-22 – 2015-09-24 (×8): 10 mg via ORAL
  Filled 2015-09-22 (×8): qty 2

## 2015-09-22 MED ORDER — PROPOFOL 10 MG/ML IV BOLUS
INTRAVENOUS | Status: AC
Start: 2015-09-22 — End: 2015-09-22
  Filled 2015-09-22: qty 20

## 2015-09-22 MED ORDER — METHOCARBAMOL 1000 MG/10ML IJ SOLN
500.0000 mg | Freq: Four times a day (QID) | INTRAVENOUS | Status: DC | PRN
  Filled 2015-09-22: qty 5

## 2015-09-22 MED ORDER — NEOSTIGMINE METHYLSULFATE 10 MG/10ML IV SOLN
INTRAVENOUS | Status: AC
  Filled 2015-09-22: qty 1

## 2015-09-22 MED ORDER — SUFENTANIL CITRATE 50 MCG/ML IV SOLN
INTRAVENOUS | Status: AC
  Filled 2015-09-22: qty 1

## 2015-09-22 MED ORDER — MENTHOL 3 MG MT LOZG: 1.0000 | LOZENGE | OROMUCOSAL | Status: DC | PRN

## 2015-09-22 MED ORDER — ROCURONIUM BROMIDE 100 MG/10ML IV SOLN
INTRAVENOUS | Status: DC | PRN
  Administered 2015-09-22: 30 mg via INTRAVENOUS

## 2015-09-22 MED ORDER — DIPHENHYDRAMINE HCL 12.5 MG/5ML PO ELIX: 12.5000 mg | ORAL_SOLUTION | ORAL | Status: DC | PRN

## 2015-09-22 MED ORDER — ONDANSETRON HCL 4 MG PO TABS: 4.0000 mg | ORAL_TABLET | Freq: Four times a day (QID) | ORAL | Status: DC | PRN

## 2015-09-22 MED ORDER — ONDANSETRON HCL 4 MG/2ML IJ SOLN
INTRAMUSCULAR | Status: AC
  Filled 2015-09-22: qty 2

## 2015-09-22 MED ORDER — MAGNESIUM CITRATE PO SOLN: 1.0000 | Freq: Once | ORAL | Status: DC | PRN

## 2015-09-22 MED ORDER — ALUM & MAG HYDROXIDE-SIMETH 200-200-20 MG/5ML PO SUSP: 30.0000 mL | ORAL | Status: DC | PRN

## 2015-09-22 MED ORDER — METHOCARBAMOL 500 MG PO TABS: 500.0000 mg | ORAL_TABLET | Freq: Four times a day (QID) | ORAL | Status: DC | PRN

## 2015-09-22 MED ORDER — ONDANSETRON HCL 4 MG/2ML IJ SOLN
4.0000 mg | Freq: Four times a day (QID) | INTRAMUSCULAR | Status: DC | PRN
Start: 2015-09-22 — End: 2015-09-24

## 2015-09-22 MED ORDER — KETOROLAC TROMETHAMINE 15 MG/ML IJ SOLN
7.5000 mg | Freq: Four times a day (QID) | INTRAMUSCULAR | Status: AC
  Administered 2015-09-22 – 2015-09-23 (×4): 7.5 mg via INTRAVENOUS
  Filled 2015-09-22 (×4): qty 1

## 2015-09-22 MED ORDER — DOCUSATE SODIUM 100 MG PO CAPS
100.0000 mg | ORAL_CAPSULE | Freq: Two times a day (BID) | ORAL | Status: DC
  Administered 2015-09-22 – 2015-09-24 (×4): 100 mg via ORAL
  Filled 2015-09-22 (×4): qty 1

## 2015-09-22 MED ORDER — MIDAZOLAM HCL 5 MG/5ML IJ SOLN
INTRAMUSCULAR | Status: DC | PRN
  Administered 2015-09-22: 2 mg via INTRAVENOUS

## 2015-09-22 MED ORDER — PROMETHAZINE HCL 25 MG/ML IJ SOLN: 6.2500 mg | INTRAMUSCULAR | Status: DC | PRN

## 2015-09-22 MED ORDER — SUCCINYLCHOLINE CHLORIDE 20 MG/ML IJ SOLN
INTRAMUSCULAR | Status: AC
  Filled 2015-09-22: qty 1

## 2015-09-22 MED ORDER — HYDROMORPHONE HCL 1 MG/ML IJ SOLN
0.5000 mg | INTRAMUSCULAR | Status: DC | PRN
  Administered 2015-09-22: 0.5 mg via INTRAVENOUS
  Filled 2015-09-22: qty 1

## 2015-09-22 MED ORDER — ONDANSETRON HCL 4 MG/2ML IJ SOLN
INTRAMUSCULAR | Status: DC | PRN
  Administered 2015-09-22: 4 mg via INTRAVENOUS

## 2015-09-22 MED ORDER — MIDAZOLAM HCL 2 MG/2ML IJ SOLN
INTRAMUSCULAR | Status: AC
  Filled 2015-09-22: qty 4

## 2015-09-22 SURGICAL SUPPLY — 59 items
BLADE SAW SAG 73X25 THK (BLADE) ×1
BLADE SAW SGTL 73X25 THK (BLADE) ×1 IMPLANT
BRUSH FEMORAL CANAL (MISCELLANEOUS) IMPLANT
CAPT HIP TOTAL 2 ×1 IMPLANT
COVER SURGICAL LIGHT HANDLE (MISCELLANEOUS) ×2 IMPLANT
DRAPE INCISE IOBAN 66X45 STRL (DRAPES) ×1 IMPLANT
DRAPE ORTHO SPLIT 77X108 STRL (DRAPES) ×4
DRAPE SURG ORHT 6 SPLT 77X108 (DRAPES) ×2 IMPLANT
DRSG MEPILEX BORDER 4X12 (GAUZE/BANDAGES/DRESSINGS) ×2 IMPLANT
DURAPREP 26ML APPLICATOR (WOUND CARE) ×4 IMPLANT
ELECT BLADE 6.5 EXT (BLADE) IMPLANT
ELECT REM PT RETURN 9FT ADLT (ELECTROSURGICAL) ×2
ELECTRODE REM PT RTRN 9FT ADLT (ELECTROSURGICAL) ×1 IMPLANT
EVACUATOR 1/8 PVC DRAIN (DRAIN) IMPLANT
FACESHIELD WRAPAROUND (MASK) ×6 IMPLANT
FACESHIELD WRAPAROUND OR TEAM (MASK) ×3 IMPLANT
GLOVE BIO SURGEON STRL SZ8.5 (GLOVE) ×2 IMPLANT
GLOVE BIOGEL PI IND STRL 8 (GLOVE) ×2 IMPLANT
GLOVE BIOGEL PI IND STRL 8.5 (GLOVE) ×1 IMPLANT
GLOVE BIOGEL PI INDICATOR 8 (GLOVE) ×2
GLOVE BIOGEL PI INDICATOR 8.5 (GLOVE) ×1
GLOVE ECLIPSE 8.0 STRL XLNG CF (GLOVE) ×5 IMPLANT
GLOVE SURG ORTHO 8.5 STRL (GLOVE) ×4 IMPLANT
GLOVE SURG SS PI 8.0 STRL IVOR (GLOVE) ×2 IMPLANT
GOWN STRL REUS W/ TWL LRG LVL3 (GOWN DISPOSABLE) ×2 IMPLANT
GOWN STRL REUS W/TWL 2XL LVL3 (GOWN DISPOSABLE) ×4 IMPLANT
GOWN STRL REUS W/TWL LRG LVL3 (GOWN DISPOSABLE) ×6
HANDPIECE INTERPULSE COAX TIP (DISPOSABLE)
IMMOBILIZER KNEE 20 (SOFTGOODS) IMPLANT
IMMOBILIZER KNEE 22 (SOFTGOODS) ×1 IMPLANT
IMMOBILIZER KNEE 22 UNIV (SOFTGOODS) ×2 IMPLANT
KIT BASIN OR (CUSTOM PROCEDURE TRAY) ×2 IMPLANT
KIT ROOM TURNOVER OR (KITS) ×2 IMPLANT
MANIFOLD NEPTUNE II (INSTRUMENTS) ×2 IMPLANT
NEEDLE 22X1 1/2 (OR ONLY) (NEEDLE) ×2 IMPLANT
NS IRRIG 1000ML POUR BTL (IV SOLUTION) ×2 IMPLANT
PACK TOTAL JOINT (CUSTOM PROCEDURE TRAY) ×2 IMPLANT
PACK UNIVERSAL I (CUSTOM PROCEDURE TRAY) ×2 IMPLANT
PAD ARMBOARD 7.5X6 YLW CONV (MISCELLANEOUS) ×3 IMPLANT
PRESSURIZER FEMORAL UNIV (MISCELLANEOUS) IMPLANT
SET HNDPC FAN SPRY TIP SCT (DISPOSABLE) IMPLANT
STAPLER VISISTAT 35W (STAPLE) ×1 IMPLANT
SUCTION FRAZIER TIP 10 FR DISP (SUCTIONS) ×2 IMPLANT
SUT BONE WAX W31G (SUTURE) IMPLANT
SUT ETHIBOND NAB CT1 #1 30IN (SUTURE) ×5 IMPLANT
SUT MNCRL AB 3-0 PS2 18 (SUTURE) ×2 IMPLANT
SUT VIC AB 0 CT1 27 (SUTURE) ×4
SUT VIC AB 0 CT1 27XBRD ANBCTR (SUTURE) ×2 IMPLANT
SUT VIC AB 1 CT1 27 (SUTURE) ×6
SUT VIC AB 1 CT1 27XBRD ANBCTR (SUTURE) ×2 IMPLANT
SUT VIC AB 2-0 CT1 27 (SUTURE) ×2
SUT VIC AB 2-0 CT1 TAPERPNT 27 (SUTURE) ×1 IMPLANT
SYR CONTROL 10ML LL (SYRINGE) ×2 IMPLANT
TOWEL OR 17X24 6PK STRL BLUE (TOWEL DISPOSABLE) ×2 IMPLANT
TOWEL OR 17X26 10 PK STRL BLUE (TOWEL DISPOSABLE) ×2 IMPLANT
TOWER CARTRIDGE SMART MIX (DISPOSABLE) IMPLANT
WATER STERILE IRR 1000ML POUR (IV SOLUTION) ×4 IMPLANT
WRAP KNEE MAXI GEL POST OP (GAUZE/BANDAGES/DRESSINGS) ×1 IMPLANT
YANKAUER SUCT BULB TIP NO VENT (SUCTIONS) ×1 IMPLANT

## 2015-09-22 NOTE — Anesthesia Procedure Notes (Signed)
Procedure Name: Intubation Date/Time: 09/22/2015 7:27 AM Performed by: Izora Gala Pre-anesthesia Checklist: Patient identified, Emergency Drugs available, Suction available and Patient being monitored Patient Re-evaluated:Patient Re-evaluated prior to inductionOxygen Delivery Method: Circle system utilized Preoxygenation: Pre-oxygenation with 100% oxygen Intubation Type: IV induction Ventilation: Mask ventilation without difficulty Laryngoscope Size: Miller and 3 Grade View: Grade II Tube type: Oral Tube size: 7.5 mm Number of attempts: 1 Airway Equipment and Method: Stylet Placement Confirmation: ETT inserted through vocal cords under direct vision,  positive ETCO2 and breath sounds checked- equal and bilateral Secured at: 23 cm Tube secured with: Tape Dental Injury: Teeth and Oropharynx as per pre-operative assessment

## 2015-09-22 NOTE — Evaluation (Signed)
Physical Therapy Evaluation Patient Details Name: Christian [name] MRN: [id number] DOB: 1946-06-19 Today's Date: 09/22/2015   History of Present Illness  Pt is a 69 y/o M s/p Rt THA. Pt's PMH inlcudes orthostatic hypotension, sciatica, CAD, hemorrhoids, Bil cataracts, constipation, HTN, nocturia.  Clinical Impression  Pt is s/p Rt THA resulting in the deficits listed below (see PT Problem List).Christian [name] is motivated to return home where he will have 24/7 assist from his wife and intermittent/prn assist available from his son, daughter, and son in law.  He ambulated 5 ft in room w/ very decreased gait w/ verbal cues for technique. Pt will benefit from skilled PT to increase their independence and safety with mobility to allow discharge to the venue listed below.     Follow Up Recommendations Home health PT;Supervision for mobility/OOB    Equipment Recommendations  None recommended by PT    Recommendations for Other Services OT consult     Precautions / Restrictions Precautions Precautions: Fall;Posterior Hip Precaution Booklet Issued: Yes (comment) Precaution Comments: Reviewed post hip precautions.  Pt recalled 2/3 at end of session (omitting IR precaution). Required Braces or Orthoses: Knee Immobilizer - Right Knee Immobilizer - Right: Other (comment) (not specified in order) Restrictions Weight Bearing Restrictions: Yes RLE Weight Bearing: Weight bearing as tolerated      Mobility  Bed Mobility Overal bed mobility: Needs Assistance Bed Mobility: Supine to Sit     Supine to sit: Min assist;HOB elevated     General bed mobility comments: HOB slightly elevated and pt uses bed rails to pull up to sitting.  Cues for technique to adhere to precautions.  Min assist managing Rt LE.  Transfers Overall transfer level: Needs assistance Equipment used: Rolling walker (2 wheeled) Transfers: Sit to/from Stand Sit to Stand: Min assist;From elevated surface         General  transfer comment: Elevated bed to adhere to precautions as pt w/ forward trunk lean, responds well to verbal cues to stand upright.  Cues for hand placement, pt slow to rise.  Min assist stabilizing RW.  Ambulation/Gait Ambulation/Gait assistance: Min guard Ambulation Distance (Feet): 5 Feet Assistive device: Rolling walker (2 wheeled) Gait Pattern/deviations: Step-to pattern;Decreased stride length;Antalgic;Trunk flexed;Shuffle;Decreased weight shift to right;Decreased stance time - right Gait velocity: very decreased Gait velocity interpretation: <1.8 ft/sec, indicative of risk for recurrent falls General Gait Details: Weight shifting performed EOB prior to ambulation.  Pt shuffling feet initally and was provided cues on weight shifting to contrlateral side to advance ipsilateral LE. Pt very slow and required cues to increase stride length as he is able.  Stairs            Wheelchair Mobility    Modified Rankin (Stroke Patients Only)       Balance Overall balance assessment: Needs assistance Sitting-balance support: Bilateral upper extremity supported;Feet supported Sitting balance-[name]: Good     Standing balance support: Bilateral upper extremity supported;During functional activity Standing balance-[name]: Poor Standing balance comment: Relies on RW for upright posture                             Pertinent Vitals/Pain Pain Assessment: 0-10 Pain Score: 5  Pain Location: Rt hip Pain Descriptors / Indicators: Aching;Constant Pain Intervention(s): Limited activity within patient's tolerance;Monitored during session;Repositioned    Home Living Family/patient expects to be discharged to:: Private residence Living Arrangements: Spouse/significant other Available Help at Discharge: Family;Available 24 hours/day (wife 24/7;  son, daughter, and son in law intermittently) Type of Home: House Home Access: Stairs to enter Entrance Stairs-Rails: Can reach  both Entrance Stairs-Number of Steps: 2 Home Layout: Two level (has chair lift to sit in to go upstairs (for deceased mother) Home Equipment: Environmental consultant - 2 wheels;Cane - single point;Bedside commode;Shower seat      Prior Function Level of Independence: Independent               Hand Dominance        Extremity/Trunk Assessment   Upper Extremity Assessment: Overall WFL for tasks assessed           Lower Extremity Assessment: RLE deficits/detail RLE Deficits / Details: weakness and limited ROM as expected s/p Rt THA       Communication   Communication: No difficulties  Cognition Arousal/Alertness: Awake/alert Behavior During Therapy: Flat affect Overall Cognitive Status: Within Functional Limits for tasks assessed                      General Comments General comments (skin integrity, edema, etc.): Pt expresses concern w/ adhering to hip flexion precaution during standing as he has noticed he tends to flex his hip when he stands.  Pt c/o dizziness at end of session while sitting, BP 114/71, RN notified.    Exercises Total Joint Exercises Ankle Circles/Pumps: AROM;Both;15 reps;Supine Gluteal Sets: Strengthening;Both;10 reps;Supine Hip ABduction/ADduction: AROM;Right;10 reps;Seated      Assessment/Plan    PT Assessment Patient needs continued PT services  PT Diagnosis Difficulty walking;Abnormality of gait;Generalized weakness;Acute pain   PT Problem List Decreased strength;Decreased range of motion;Decreased activity tolerance;Decreased balance;Decreased mobility;Decreased knowledge of use of DME;Decreased safety awareness;Decreased knowledge of precautions;Decreased skin integrity;Pain  PT Treatment Interventions DME instruction;Gait training;Stair training;Functional mobility training;Therapeutic activities;Therapeutic exercise;Balance training;Neuromuscular re-education;Patient/family education;Modalities   PT Goals (Current goals can be found in the  Care Plan section) Acute Rehab PT Goals Patient Stated Goal: to get stronger so he can return home PT Goal Formulation: With patient/family Time For Goal Achievement: 09/29/15 Potential to Achieve Goals: Good    Frequency 7X/week   Barriers to discharge Inaccessible home environment 2 steps to enter home    Co-evaluation               End of Session Equipment Utilized During Treatment: Gait belt;Right knee immobilizer Activity Tolerance: Patient limited by pain Patient left: in chair;with call bell/phone within reach;with family/visitor present Nurse Communication: Mobility status;Weight bearing status;Precautions;Other (comment) (BP 114/71 sitting at end of session)         Time: 9735-3299 PT Time Calculation (min) (ACUTE ONLY): 32 min   Charges:   PT Evaluation $Initial PT Evaluation Tier I: 1 Procedure PT Treatments $Therapeutic Exercise: 8-22 mins   PT G Codes:       Joslyn Hy PT, DPT [phone] Pager: [phone] 09/22/2015, 2:37 PM

## 2015-09-22 NOTE — Op Note (Signed)
PATIENT ID:      Christian [name]  MRN:     [id number] DOB/AGE:    03-Jan-1946 / 69 y.o.       OPERATIVE REPORT    DATE OF PROCEDURE:  09/22/2015       PREOPERATIVE DIAGNOSIS: END STAGE, PRIMARY  RIGHT HIP OSTEOARTHRITIS                                                       Estimated body mass index is 37.76 kg/(m^2) as calculated from the following:   Height as of this encounter: 5' 10.5" (1.791 m).   Weight as of this encounter: 121.11 kg (267 lb).     POSTOPERATIVE DIAGNOSIS:   RIGHT HIP OSTEOARTHRITIS-SAME                                                                     Estimated body mass index is 37.76 kg/(m^2) as calculated from the following:   Height as of this encounter: 5' 10.5" (1.791 m).   Weight as of this encounter: 121.11 kg (267 lb).     PROCEDURE:  Procedure(s):RIGHT TOTAL HIP ARTHROPLASTY      SURGEON:  [name], MD    ASSISTANT:   Biagio Borg, PA-C   (Present and scrubbed throughout the case, critical for assistance with exposure, retraction, instrumentation, and closure.)          ANESTHESIA: general     DRAINS: none :      TOURNIQUET TIME: * No tourniquets in log *    COMPLICATIONS:  None   CONDITION:  stable  PROCEDURE IN DETAIL: [id number]   [name], Christian W 09/22/2015, 10:04 AM

## 2015-09-22 NOTE — Anesthesia Preprocedure Evaluation (Addendum)
Anesthesia Evaluation  Patient identified by MRN, date of birth, ID band Patient awake    Reviewed: Allergy & Precautions, NPO status , Patient's Chart, lab work & pertinent test results  Airway Mallampati: II  TM Distance: >3 FB Neck ROM: Full    Dental  (+) Dental Advisory Given   Pulmonary former smoker,    breath sounds clear to auscultation- rhonchi       Cardiovascular hypertension, Pt. on medications + CAD   Rhythm:Regular Rate:Normal     Neuro/Psych negative neurological ROS     GI/Hepatic negative GI ROS, Neg liver ROS,   Endo/Other  Morbid obesity  Renal/GU negative Renal ROS     Musculoskeletal  (+) Arthritis ,   Abdominal   Peds  Hematology negative hematology ROS (+)   Anesthesia Other Findings   Reproductive/Obstetrics                            Lab Results  Component Value Date   WBC 8.2 09/11/2015   HGB 13.7 09/11/2015   HCT 40.7 09/11/2015   MCV 92.7 09/11/2015   PLT 220 09/11/2015   Lab Results  Component Value Date   CREATININE 1.15 09/11/2015   BUN 10 09/11/2015   NA 139 09/11/2015   K 4.4 09/11/2015   CL 107 09/11/2015   CO2 25 09/11/2015    Anesthesia Physical Anesthesia Plan  ASA: III  Anesthesia Plan: General   Post-op Pain Management:    Induction: Intravenous  Airway Management Planned: Oral ETT  Additional Equipment:   Intra-op Plan:   Post-operative Plan: Extubation in OR  Informed Consent: I have reviewed the patients History and Physical, chart, labs and discussed the procedure including the risks, benefits and alternatives for the proposed anesthesia with the patient or authorized representative who has indicated his/her understanding and acceptance.   Dental advisory given  Plan Discussed with: CRNA  Anesthesia Plan Comments:        Anesthesia Quick Evaluation

## 2015-09-22 NOTE — Anesthesia Postprocedure Evaluation (Signed)
  Anesthesia Post-op Note  Patient: Christian [name]  Procedure(s) Performed: Procedure(s): TOTAL HIP ARTHROPLASTY (Right)  Patient Location: PACU  Anesthesia Type:General  Level of Consciousness: awake and alert   Airway and Oxygen Therapy: Patient Spontanous Breathing  Post-op Pain: none  Post-op Assessment: Post-op Vital signs reviewed     RLE Motor Response: Responds to commands RLE Sensation: No numbness, No tingling      Post-op Vital Signs: Reviewed  Last Vitals:  Filed Vitals:   09/22/15 1249  BP: 110/67  Pulse: 65  Temp: 36.4 C  Resp: 16    Complications: No apparent anesthesia complications

## 2015-09-22 NOTE — Progress Notes (Signed)
Utilization review completed.  

## 2015-09-22 NOTE — H&P (Signed)
  The recent History & Physical has been reviewed. I have personally examined the patient today. There is no interval change to the documented History & Physical. The patient would like to proceed with the procedure.  Christian [name] W 09/22/2015,  7:08 AM

## 2015-09-22 NOTE — Transfer of Care (Signed)
Immediate Anesthesia Transfer of Care Note  Patient: Christian [name]  Procedure(s) Performed: Procedure(s): TOTAL HIP ARTHROPLASTY (Right)  Patient Location: PACU  Anesthesia Type:General  Level of Consciousness: awake, alert  and oriented  Airway & Oxygen Therapy: Patient Spontanous Breathing and Patient connected to face mask oxygen  Post-op Assessment: Report given to RN and Post -op Vital signs reviewed and stable  Post vital signs: Reviewed and stable  Last Vitals:  Filed Vitals:   09/22/15 1020  BP: 126/78  Pulse: 86  Temp: 36.9 C  Resp: 18    Complications: No apparent anesthesia complications

## 2015-09-23 ENCOUNTER — Encounter (HOSPITAL_COMMUNITY): Payer: Self-pay | Admitting: Orthopaedic Surgery

## 2015-09-23 LAB — BASIC METABOLIC PANEL
Anion gap: 5 (ref 5–15)
BUN: 12 mg/dL (ref 6–20)
CALCIUM: 8.2 mg/dL — AB (ref 8.9–10.3)
CO2: 26 mmol/L (ref 22–32)
CREATININE: 1.08 mg/dL (ref 0.61–1.24)
Chloride: 103 mmol/L (ref 101–111)
GFR calc Af Amer: >60 mL/min (ref >60)
GFR calc non Af Amer: >60 mL/min (ref >60)
GLUCOSE: 121 mg/dL — AB (ref 65–99)
Potassium: 4.2 mmol/L (ref 3.5–5.1)
Sodium: 134 mmol/L — ABNORMAL LOW (ref 135–145)

## 2015-09-23 LAB — CBC
HCT: 29.6 % — ABNORMAL LOW (ref 39.0–52.0)
HEMOGLOBIN: 9.8 g/dL — AB (ref 13.0–17.0)
MCH: 30.8 pg (ref 26.0–34.0)
MCHC: 33.1 g/dL (ref 30.0–36.0)
MCV: 93.1 fL (ref 78.0–100.0)
Platelets: 162 10*3/uL (ref 150–400)
RBC: 3.18 MIL/uL — AB (ref 4.22–5.81)
RDW: 13.4 % (ref 11.5–15.5)
WBC: 8.4 10*3/uL (ref 4.0–10.5)

## 2015-09-23 LAB — POCT I-STAT 4, (NA,K, GLUC, HGB,HCT)
Glucose, Bld: 109 mg/dL — ABNORMAL HIGH (ref 65–99)
HCT: 37 % — ABNORMAL LOW (ref 39.0–52.0)
Hemoglobin: 12.6 g/dL — ABNORMAL LOW (ref 13.0–17.0)
POTASSIUM: 4 mmol/L (ref 3.5–5.1)
Sodium: 139 mmol/L (ref 135–145)

## 2015-09-23 NOTE — Discharge Instructions (Signed)
Information on my medicine - XARELTO® (Rivaroxaban) ° °This medication education was reviewed with me or my healthcare representative as part of my discharge preparation.  The pharmacist that spoke with me during my hospital stay was:  Tyreshia Ingman Kay, RPH ° °Why was Xarelto® prescribed for you? °Xarelto® was prescribed for you to reduce the risk of blood clots forming after orthopedic surgery. The medical term for these abnormal blood clots is venous thromboembolism (VTE). ° °What do you need to know about xarelto® ? °Take your Xarelto® ONCE DAILY at the same time every day. °You may take it either with or without food. ° °If you have difficulty swallowing the tablet whole, you may crush it and mix in applesauce just prior to taking your dose. ° °Take Xarelto® exactly as prescribed by your doctor and DO NOT stop taking Xarelto® without talking to the doctor who prescribed the medication.  Stopping without other VTE prevention medication to take the place of Xarelto® may increase your risk of developing a clot. ° °After discharge, you should have regular check-up appointments with your healthcare provider that is prescribing your Xarelto®.   ° °What do you do if you miss a dose? °If you miss a dose, take it as soon as you remember on the same day then continue your regularly scheduled once daily regimen the next day. Do not take two doses of Xarelto® on the same day.  ° °Important Safety Information °A possible side effect of Xarelto® is bleeding. You should call your healthcare provider right away if you experience any of the following: °? Bleeding from an injury or your nose that does not stop. °? Unusual colored urine (red or dark brown) or unusual colored stools (red or black). °? Unusual bruising for unknown reasons. °? A serious fall or if you hit your head (even if there is no bleeding). ° °Some medicines may interact with Xarelto® and might increase your risk of bleeding while on Xarelto®. To help avoid  this, consult your healthcare provider or pharmacist prior to using any new prescription or non-prescription medications, including herbals, vitamins, non-steroidal anti-inflammatory drugs (NSAIDs) and supplements. ° °This website has more information on Xarelto®: www.xarelto.com. ° ° °

## 2015-09-23 NOTE — Op Note (Signed)
NAMEMarland [name]  DIJUAN, SLEETH NO.:  [id number]  MEDICAL RECORD NO.:  [id number]  LOCATION:  5N28C                        FACILITY:  [facility]  PHYSICIAN:  Vonna Kotyk. Whitfield, M.D.DATE OF BIRTH:  [birth year]  DATE OF PROCEDURE:  09/22/2015 DATE OF DISCHARGE:                              OPERATIVE REPORT   PREOPERATIVE DIAGNOSIS:  Primary end-stage osteoarthritis, right hip.  POSTOPERATIVE DIAGNOSIS:  Primary end-stage osteoarthritis, right hip.  PROCEDURE:  Right total hip replacement.  SURGEON:  Vonna Kotyk. Durward Fortes, M.D.  ASSISTANT:  Aaron Edelman D. Petrarca, P.A.-C., who was present throughout the operative procedure to ensure its timely completion.  ANESTHESIA:  General.  COMPLICATIONS:  None.  COMPONENTS:  DePuy AML 13.5-mm small-stature femoral component, 36-mm outer diameter metallic hip ball with a 61-PJ neck length, 56-mm outer diameter Gription 3 acetabular component with an apex hole eliminator and a Marathon polyethylene liner +4 with a 10-degree posterior lip. Components were press-fit.  DESCRIPTION OF PROCEDURE:  Mr. Cruise was met in the holding area, identified the right hip as the appropriate operative site and marked it accordingly.  The patient was then transported to room #8, placed under general anesthesia without difficulty and the patient was then placed in the lateral decubitus position with right side up and secured to the operating table with the Innomed hip system.  The right lower extremity was then prepped from the iliac crest to the midcalf with chlorhexidine scrub and DuraPrep x2.  Sterile draping was performed.  Time-out was called.  Routine Southern incision was utilized via sharp dissection, carried down to the subcutaneous tissue.  There was abundant adipose tissue and in fact, the entire procedure was technically difficult because of the patient's size and abundant adipose.  After incising adipose tissue, self-retaining retractors were  inserted.  The iliotibial band was identified and incised along length of the skin incision.  Self- retaining retractors were placed more deeply.  He had some difficulty visualizing the short external rotators as the patient's size, these were carefully incised and tagged.  The capsule was identified and sized along the femoral neck and head.  There was very minimal effusion.  At that point, I could easily dislocate the hip posteriorly.  I did palpate the sciatic nerve throughout the procedure and thought it was well out of harm's way.  The head was then osteotomized at the head-neck junction using the calcar guide.  Soft tissue was removed from the piriformis fossa.  A starter hole was then made and then the canal finder was inserted, reaming was performed to 13 mm to accept a 13.5-mm component, very nice endosteal purchase.  Rasping was performed sequentially to 13.5-mm small- stature with excellent fit on the calcar.  I did use a calcar reamer to obtain the appropriate angle.  The final cut was approximately a fingerbreadth proximal to the lesser trochanter.  Retractors were then placed about the acetabulum.  There was a large redundant labrum that was sharply excised.  Reaming was then performed sequentially to 55 mm to accept a 56 component, 54-mm component would easily fit and we would completely seat, the 56 would not.  We did have to take time to ream because of  the poor visualization and making sure that we had good bleeding bone.  I thought we had really good coverage. There were osteophytes along the periphery, which were loosened and removed.  The 56-mm outer diameter Gription 3 acetabular component was then impacted, very nice fit, very nice in tight.  We had excellent position. The trial polyethylene liner was then inserted.  The 13.5-mm femoral rasp was then inserted.  We then used the trial neck and then trialed several 36-mm outer diameter hip ball and neck  lengths and we thought that the +12 gave [name] equal leg lengths with perfect stability.  I felt like we deepened the acetabulum thus the reason for the increased length on the neck.  There was no toggling, did not appear to be too tight and I could not dislocate or sublux the hip at that point.  The trial components were then removed.  We copiously irrigated the hip joint throughout the operative procedure.  Apex hole eliminator was inserted followed by the final marathon polyethylene liner +4 with a 10- degree posterior lip.  I then placed tranexamic acid into the acetabulum.  The final femoral component was then impacted to 13.5 mm in about 15 degrees of anteversion.  I thought we had a very nice fit and we cleaned the Morse tapered neck, we trialed the 12-mm neck and 36 ball with excellent position and then removed the trial component and inserted the final 36-mm outer diameter hip ball with a 12-mm neck.  We cleared the acetabulum.  The hip was then reduced through a full range of motion.  I did not feel like it was at the least, but unstable and I felt leg lengths were symmetrical.  Wounds were irrigated with saline solution.  We inserted the rest of the tranexamic acid into the wound.  Capsule was then closed with 0 Ethibond suture.  The short external rotators were closed with the similar material.  The IT band was closed with running #1 Vicryl.  The wound was then irrigated.  We infiltrated the deep capsule in the IT band with 0.25% Marcaine with epinephrine.  The subcu was closed in several layers with 0 and 2-0 Vicryl, 3-0 Monocryl and then skin clips.  Sterile bulky dressing was applied.  The patient was then placed supine, awoken and transferred to the operating room stretcher, returned to the postanesthesia recovery care without a problem.     Vonna Kotyk. Durward Fortes, M.D.     PWW/MEDQ  D:  09/22/2015  T:  09/23/2015  Job:  [id number]

## 2015-09-23 NOTE — Progress Notes (Signed)
Patient ID: Christian [name], male   DOB: 08-08-1946, 69 y.o.   MRN: [id number] PATIENT ID: Christian [name]        MRN:  [id number]          DOB/AGE: 1946-11-18 / 69 y.o.    Christian Fears, MD   Christian Borg, PA-C [address] [place], [place]  [address]                             [phone]   PROGRESS NOTE  Subjective:  negative for Chest Pain  negative for Shortness of Breath  negative for Nausea/Vomiting   negative for Calf Pain    Tolerating Diet: yes         Patient reports pain as mild.     Minimal pain.  Very happy this morning.  No complaints  Objective: Vital signs in last 24 hours:   Patient Vitals for the past 24 hrs:  BP Temp Temp src Pulse Resp SpO2  09/23/15 0406 (!) 100/47 mmHg 98.7 F (37.1 C) Oral 86 16 95 %  09/22/15 2336 (!) 100/59 mmHg 98.6 F (37 C) Oral 77 16 97 %  09/22/15 2040 108/64 mmHg 98.8 F (37.1 C) Oral 75 16 98 %  09/22/15 1249 110/67 mmHg 97.5 F (36.4 C) Oral 65 16 98 %  09/22/15 1145 (!) 98/58 mmHg - - 85 - 96 %  09/22/15 1130 98/60 mmHg - - - - -  09/22/15 1115 106/65 mmHg - - 85 15 99 %  09/22/15 1100 118/71 mmHg - - 68 14 99 %  09/22/15 1045 120/72 mmHg - - 77 17 98 %  09/22/15 1020 126/78 mmHg 98.5 F (36.9 C) - 86 18 100 %      Intake/Output from previous day:   09/27 0701 - 09/28 0700 In: 2700 [I.V.:2200] Out: 1850 [Urine:750]   Intake/Output this shift:       Intake/Output      09/27 0701 - 09/28 0700 09/28 0701 - 09/29 0700   I.V. (mL/kg) 2200 (18.2)    IV Piggyback 500    Total Intake(mL/kg) 2700 (22.3)    Urine (mL/kg/hr) 750 (0.3)    Blood 1100 (0.4)    Total Output 1850     Net +850             LABORATORY DATA:  Recent Labs  09/22/15 0923 09/23/15 0605  WBC  --  8.4  HGB 12.6* 9.8*  HCT 37.0* 29.6*  PLT  --  162    Recent Labs  09/22/15 0923 09/23/15 0605  NA 139 134*  K 4.0 4.2  CL  --  103  CO2  --  26  BUN  --  12  CREATININE  --  1.08  GLUCOSE 109* 121*  CALCIUM  --  8.2*    Lab Results  Component Value Date   INR 1.08 09/11/2015   INR 0.99 04/29/2014   INR 0.96 01/07/2010    Recent Radiographic Studies :  Dg Chest 2 View  09/11/2015   CLINICAL DATA:  Arthroplasty.  EXAM: CHEST  2 VIEW  COMPARISON:  04/29/2014 .  FINDINGS: Mediastinum hilar structures normal. Cardiomegaly with normal pulmonary vascularity. Low lung volumes with mild bibasilar atelectasis. No pleural effusion or pneumothorax. No acute bony abnormality .  IMPRESSION: 1. Cardiomegaly.  No pulmonary venous congestion. 2. Low lung volumes with mild bibasilar atelectasis.   Electronically Signed   By: Marcello Moores  Register   On: 09/11/2015 10:08   Dg Hip Port Unilat With Pelvis 1v Right  09/22/2015   CLINICAL DATA:  Post right hip replacement.  EXAM: DG HIP (WITH OR WITHOUT PELVIS) 1V PORT RIGHT  COMPARISON:  None.  FINDINGS: Evidence of recent right total hip arthroplasty with prosthetic components intact and normally located. Skin staples over the lateral soft tissues of the right hip. Mild degenerative change of the left hip and spine.  IMPRESSION: Post right total hip arthroplasty without complicating features.   Electronically Signed   By: Marin Olp M.D.   On: 09/22/2015 11:42     Examination:  General appearance: alert, cooperative and mild distress Resp: clear to auscultation bilaterally Cardio: regular rate and rhythm occasional ectopic  GI: normal findings: bowel sounds normal  Wound Exam: clean, dry, intact   Drainage:  None: wound tissue dry  Motor Exam: EHL, FHL, Anterior Tibial and Posterior Tibial Intact  Sensory Exam: Superficial Peroneal, Deep Peroneal and Tibial normal  Vascular Exam: Right posterior tibial artery has trace pulse  Assessment:    1 Day Post-Op  Procedure(s) (LRB): TOTAL HIP ARTHROPLASTY (Right)  ADDITIONAL DIAGNOSIS:  Principal Problem:   Osteoarthritis of right hip Active Problems:   S/P total hip arthroplasty     Plan: Physical Therapy as  ordered Weight Bearing as Tolerated (WBAT)  DVT Prophylaxis:  Xarelto, Foot Pumps and TED hose  DISCHARGE PLAN: Home tomorrow  DISCHARGE NEEDS: HHPT, Walker and 3-in-1 comode seat        Christian [name]  09/23/2015 8:07 AM

## 2015-09-23 NOTE — Evaluation (Signed)
Occupational Therapy Evaluation Patient Details Name: Christian [name] MRN: [id number] DOB: February 19, 1946 Today's Date: 09/23/2015    History of Present Illness Pt is a 69 y/o M s/p Rt THA. Pt's PMH inlcudes orthostatic hypotension, sciatica, CAD, hemorrhoids, Bil cataracts, constipation, HTN, nocturia.   Clinical Impression   Pt reports he was independent with ADLs PTA. Currently pt is min assist for sit <> stand and min guard for ADLs overall. Pt able to recall 3/3 hip precautions. Pt reports that he has AE at home from previous knee surgery; reviewed uses for AE and technique, pt and wife verbalize understanding with no questions. Educated pt and wife on compensatory strategies for LB ADLs while maintaining hip precautions and tub bench transfer. Plan to practice tub bench transfer with pt and wife tomorrow morning before d/c. Pt plan to d/c home with 24/7 supervision from wife. Pt would benefit from continued skilled OT services in order to maximize independence and safety with tub and toilet transfers while adhering to posterior hip precautions.    Follow Up Recommendations  No OT follow up;Supervision - Intermittent    Equipment Recommendations  None recommended by OT    Recommendations for Other Services       Precautions / Restrictions Precautions Precautions: Fall;Posterior Hip Precaution Comments: Reviewed posterior hip precuations. Pt able to recall 3/3 at beginning of session Restrictions Weight Bearing Restrictions: Yes RLE Weight Bearing: Weight bearing as tolerated      Mobility Bed Mobility Overal bed mobility: Needs Assistance Bed Mobility: Supine to Sit     Supine to sit: Min assist;HOB elevated     General bed mobility comments: HOB elevated and use of bed rails. Verbal cues to adhere to precuations. Min assist to guide R LE to EOB.  Transfers Overall transfer level: Needs assistance Equipment used: Rolling walker (2 wheeled) Transfers: Sit to/from  Stand Sit to Stand: Min assist         General transfer comment: Verbal cues for adhering to hip precautions and hand placement. Sit > stand from EOB x 1    Balance Overall balance assessment: Needs assistance Sitting-balance support: Feet supported;Bilateral upper extremity supported Sitting balance-[name]: Good     Standing balance support: During functional activity Standing balance-[name]: Poor Standing balance comment: RW for support, able to let go with B UE for washing hands and toileting                            ADL Overall ADL's : Needs assistance/impaired Eating/Feeding: Set up;Sitting   Grooming: Wash/dry hands;Supervision/safety;Standing   Upper Body Bathing: Supervision/ safety;Sitting   Lower Body Bathing: Minimal assistance;Sit to/from stand   Upper Body Dressing : Supervision/safety;Sitting   Lower Body Dressing: Minimal assistance;Sit to/from stand   Toilet Transfer: Min guard;Ambulation;RW Toilet Transfer Details (indicate cue type and reason): Pt able to stand at toilet to urinate, RW for support Toileting- Water quality scientist and Hygiene: Min guard;Sit to/from stand   Tub/ Shower Transfer: Min guard;Ambulation;Tub bench;Rolling walker   Functional mobility during ADLs: Min guard General ADL Comments: Educated pt and wife on compensatory strategies for ADLs, posterior hip precautions during functional activity and sit <> stand. Pt reports he has AE at home from previous surgery, reviewed use of AE for LB ADLs, pt verbalized understanding of techniques, has no questions at this time. Discussed tub bench transfer, plan to practice tomorrow prior to d/c home.     Vision  Perception     Praxis      Pertinent Vitals/Pain Pain Assessment: 0-10 Pain Score:  ("getting pretty bad" with movement ) Pain Descriptors / Indicators: Aching;Sore Pain Intervention(s): Limited activity within patient's tolerance;Monitored during  session;Repositioned;Ice applied     Hand Dominance Right   Extremity/Trunk Assessment Upper Extremity Assessment Upper Extremity Assessment: Overall WFL for tasks assessed   Lower Extremity Assessment Lower Extremity Assessment: Defer to PT evaluation   Cervical / Trunk Assessment Cervical / Trunk Assessment: Normal   Communication Communication Communication: No difficulties   Cognition Arousal/Alertness: Awake/alert Behavior During Therapy: WFL for tasks assessed/performed Overall Cognitive Status: Within Functional Limits for tasks assessed                     General Comments       Exercises       Shoulder Instructions      Home Living Family/patient expects to be discharged to:: Private residence Living Arrangements: Spouse/significant other Available Help at Discharge: Family;Available 24 hours/day Type of Home: House Home Access: Stairs to enter [employer] of Steps: 2 Entrance Stairs-Rails: Can reach both Home Layout: Two level;Other (Comment) (has chair lift to sit in to go upstairs )     Bathroom Shower/Tub: Tub/shower unit;Curtain   Bathroom Toilet: Standard Bathroom Accessibility: Yes How Accessible: Accessible via walker Home Equipment: Walker - 2 wheels;Cane - single point;Bedside commode;Tub bench;Other (comment) (AE: reacher, sock aide, lh sponge, lh shoe horn)          Prior Functioning/Environment Level of Independence: Independent             OT Diagnosis: Generalized weakness;Acute pain   OT Problem List: Decreased strength;Impaired balance (sitting and/or standing);Decreased safety awareness;Decreased knowledge of use of DME or AE;Decreased knowledge of precautions;Pain   OT Treatment/Interventions: Self-care/ADL training;DME and/or AE instruction;Patient/family education    OT Goals(Current goals can be found in the care plan section) Acute Rehab OT Goals Patient Stated Goal: to go home OT Goal Formulation:  With patient/family Time For Goal Achievement: 10/07/15 Potential to Achieve Goals: Good ADL Goals Pt Will Transfer to Toilet: with modified independence;ambulating;bedside commode (BSC over toilet, maintaining hip precautions) Pt Will Perform Tub/Shower Transfer: Tub transfer;with supervision;with caregiver independent in assisting;ambulating;tub bench;rolling walker (maintaining hip precautions)  OT Frequency: Min 2X/week   Barriers to D/C:            Co-evaluation              End of Session Equipment Utilized During Treatment: Gait belt;Rolling walker  Activity Tolerance: Patient tolerated treatment well Patient left: in chair;with call bell/phone within reach;with nursing/sitter in room;with family/visitor present   Time: 9470-9628 OT Time Calculation (min): 30 min Charges:  OT General Charges $OT Visit: 1 Procedure OT Evaluation $Initial OT Evaluation Tier I: 1 Procedure OT Treatments $Self Care/Home Management : 8-22 mins G-Codes:     Binnie Kand M.S., OTR/L Pager: [phone]  09/23/2015, 9:42 AM

## 2015-09-23 NOTE — Progress Notes (Signed)
Physical Therapy Treatment Patient Details Name: Christian [name] MRN: [id number] DOB: September 12, 1946 Today's Date: 09/23/2015    History of Present Illness Pt is a 69 y/o M s/p Rt THA. Pt's PMH inlcudes orthostatic hypotension, sciatica, CAD, hemorrhoids, Bil cataracts, constipation, HTN, nocturia.    PT Comments    Mr. Januszewski continues to progress w/ therapy but continues to demonstrate trunk flexion during ambulation.  Pt will benefit from continued skilled PT services to increase functional independence and safety.  Follow Up Recommendations  Home health PT;Supervision for mobility/OOB     Equipment Recommendations  None recommended by PT    Recommendations for Other Services       Precautions / Restrictions Precautions Precautions: Fall;Posterior Hip Precaution Comments: Reviewed post hip precautions.  Pt recalled 3/3 precautions w/o any assist. Required Braces or Orthoses: Knee Immobilizer - Right (per pt, MD d/c this morning) Knee Immobilizer - Right: Other (comment) (not specified in order) Restrictions Weight Bearing Restrictions: Yes RLE Weight Bearing: Weight bearing as tolerated    Mobility  Bed Mobility Overal bed mobility: Needs Assistance Bed Mobility: Supine to Sit     Supine to sit: Min assist     General bed mobility comments: HOB flat and no use of bed rails to simulate home environment.  Cues to adhere to precautions and min assist managing Rt LE to EOB  Transfers Overall transfer level: Needs assistance Equipment used: Rolling walker (2 wheeled) Transfers: Sit to/from Stand Sit to Stand: Supervision         General transfer comment: Pt w/ excellent sit>stand technique.  However, initial stand>sit pt demonstrates uncontrolled descent.  Upon second attempt he demonstrates control.  Ambulation/Gait Ambulation/Gait assistance: Supervision Ambulation Distance (Feet): 150 Feet Assistive device: Rolling walker (2 wheeled) Gait Pattern/deviations:  Step-through pattern;Step-to pattern;Antalgic;Trunk flexed;Decreased weight shift to right   Gait velocity interpretation: Below normal speed for age/gender General Gait Details: Cues for upright posture and to relax shoulders as pt continue to bear weight through RW to offload Rt LE.     Stairs            Wheelchair Mobility    Modified Rankin (Stroke Patients Only)       Balance Overall balance assessment: Needs assistance Sitting-balance support: No upper extremity supported;Feet supported Sitting balance-[name]: Good     Standing balance support: Bilateral upper extremity supported;During functional activity Standing balance-[name]: Fair Standing balance comment: Pt able to wash hands at sink w/o assist                    Cognition Arousal/Alertness: Awake/alert Behavior During Therapy: WFL for tasks assessed/performed Overall Cognitive Status: Within Functional Limits for tasks assessed                      Exercises Total Joint Exercises Ankle Circles/Pumps: AROM;Both;Supine;10 reps Hip ABduction/ADduction: AAROM;Right;10 reps;Seated Knee Flexion: AROM;Right;10 reps;Standing    General Comments General comments (skin integrity, edema, etc.): Wife present during session      Pertinent Vitals/Pain Pain Assessment: 0-10 Pain Score: 6  (4/10 w/ ambulation) Pain Location: Rt hip Pain Descriptors / Indicators: Sore Pain Intervention(s): Limited activity within patient's tolerance;Monitored during session;Repositioned    Home Living                      Prior Function            PT Goals (current goals can now be found in the care plan  section) Acute Rehab PT Goals Patient Stated Goal: to get stronger so he can return home PT Goal Formulation: With patient/family Time For Goal Achievement: 09/29/15 Potential to Achieve Goals: Good Progress towards PT goals: Progressing toward goals    Frequency  7X/week    PT  Plan Current plan remains appropriate    Co-evaluation             End of Session Equipment Utilized During Treatment: Gait belt Activity Tolerance: Patient tolerated treatment well;Patient limited by fatigue Patient left: in chair;with call bell/phone within reach;with family/visitor present     Time: 2244-9753 PT Time Calculation (min) (ACUTE ONLY): 31 min  Charges:  $Gait Training: 8-22 mins $Therapeutic Exercise: 8-22 mins                    G Codes:      Joslyn Hy PT, [place] [phone] Pager: [phone] 09/23/2015, 3:21 PM

## 2015-09-23 NOTE — Care Management Note (Signed)
Case Management Note  Patient Details  Name: Christian [name] MRN: [id number] Date of Birth: 1946-03-24  Subjective/Objective:            S/p right total hip arthroplasty        Action/Plan: Set up with [facility] for HHPT by MD office. Spoke with patient and his wife, no change in discharge plan. Patient stated that he has a rolling walker and 3N1 at home and his wife will be able to assist after discharge.   Expected Discharge Date:                  Expected Discharge Plan:  [place]  In-House Referral:  NA  Discharge planning Services  CM Consult  Post Acute Care Choice:  Home Health Choice offered to:  Patient  DME Arranged:    DME Agency:     HH Arranged:  PT HH Agency:  [place]  Status of Service:  Completed, signed off  Medicare Important Message Given:    Date Medicare IM Given:    Medicare IM give by:    Date Additional Medicare IM Given:    Additional Medicare Important Message give by:     If discussed at [place] of Stay Meetings, dates discussed:    Additional Comments:  Nila Nephew, RN 09/23/2015, 11:14 AM

## 2015-09-23 NOTE — Progress Notes (Signed)
Physical Therapy Treatment Patient Details Name: Christian [name] MRN: [id number] DOB: 1946/08/29 Today's Date: 09/23/2015    History of Present Illness Pt is a 69 y/o M s/p Rt THA. Pt's PMH inlcudes orthostatic hypotension, sciatica, CAD, hemorrhoids, Bil cataracts, constipation, HTN, nocturia.    PT Comments    Christian [name] made excellent progress w/ ambulatory activity this session.  Pt demonstrated ability to ambulate 200 ft and ascend/descend 2 steps this session w/ supervision.  Pt will benefit from continued skilled PT services to increase functional independence and safety.   Follow Up Recommendations  Home health PT;Supervision for mobility/OOB     Equipment Recommendations  None recommended by PT    Recommendations for Other Services       Precautions / Restrictions Precautions Precautions: Fall;Posterior Hip Precaution Comments: Reviewed post hip precautions.  Pt recalled 3/3 at end of session. Required Braces or Orthoses: Knee Immobilizer - Right (per pt, MD d/c this morning) Knee Immobilizer - Right: Other (comment) (not specified in order) Restrictions Weight Bearing Restrictions: Yes RLE Weight Bearing: Weight bearing as tolerated    Mobility  Bed Mobility Overal bed mobility: Needs Assistance Bed Mobility: Supine to Sit     Supine to sit: Min assist;HOB elevated     General bed mobility comments: Pt sitting in recliner at start and end of session  Transfers Overall transfer level: Needs assistance Equipment used: Rolling walker (2 wheeled) Transfers: Sit to/from Stand Sit to Stand: Min guard         General transfer comment: Reviewed hip precautions prior to performing sit<>stand and pt did excellent job of preventing trunk flexion during transfer.  Min guard for safety.  Ambulation/Gait Ambulation/Gait assistance: Supervision Ambulation Distance (Feet): 200 Feet Assistive device: Rolling walker (2 wheeled) Gait Pattern/deviations: Step-through  pattern;Antalgic;Trunk flexed;Decreased weight shift to right   Gait velocity interpretation: Below normal speed for age/gender General Gait Details: Cues for upright posture and to relax shoulders as pt continue to bear weight through RW to offload Rt LE.     Stairs Stairs: Yes Stairs assistance: Supervision Stair Management: Two rails;Step to pattern;Forwards Number of Stairs: 2 General stair comments: Wife present during session and wife and pt verbalized understanding of technique.  Verbal cues during stair training for proper technique, pt performs safely.  Wheelchair Mobility    Modified Rankin (Stroke Patients Only)       Balance Overall balance assessment: Needs assistance Sitting-balance support: No upper extremity supported;Feet supported Sitting balance-[name]: Good     Standing balance support: During functional activity;No upper extremity supported Standing balance-[name]: Fair Standing balance comment: Pt able to adjust gown standing unassisted prior to sitting                    Cognition Arousal/Alertness: Awake/alert Behavior During Therapy: WFL for tasks assessed/performed Overall Cognitive Status: Within Functional Limits for tasks assessed                      Exercises Total Joint Exercises Ankle Circles/Pumps: AROM;Both;15 reps;Seated Gluteal Sets: Strengthening;Both;10 reps;Seated Standing Hip Extension: AROM;Right;10 reps;Standing    General Comments General comments (skin integrity, edema, etc.): Wife present during PT session      Pertinent Vitals/Pain Pain Assessment: 0-10 Pain Score: 4  Pain Location: Rt hip Pain Descriptors / Indicators: Aching;Sore Pain Intervention(s): Limited activity within patient's tolerance;Monitored during session;Ice applied    Home Living Family/patient expects to be discharged to:: Private residence Living Arrangements: Spouse/significant other Available  Help at Discharge:  Family;Available 24 hours/day Type of Home: House Home Access: Stairs to enter Entrance Stairs-Rails: Can reach both Home Layout: Two level;Other (Comment) (has chair lift to sit in to go upstairs ) Home Equipment: Gilford Rile - 2 wheels;Cane - single point;Bedside commode;Tub bench;Other (comment) (AE: reacher, sock aide, lh sponge, lh shoe horn)      Prior Function Level of Independence: Independent          PT Goals (current goals can now be found in the care plan section) Acute Rehab PT Goals Patient Stated Goal: to get stronger so he can return home PT Goal Formulation: With patient/family Time For Goal Achievement: 09/29/15 Potential to Achieve Goals: Good Progress towards PT goals: Progressing toward goals    Frequency  7X/week    PT Plan Current plan remains appropriate    Co-evaluation             End of Session Equipment Utilized During Treatment: Gait belt Activity Tolerance: Patient tolerated treatment well Patient left: in chair;with call bell/phone within reach;with family/visitor present     Time: 5027-7412 PT Time Calculation (min) (ACUTE ONLY): 23 min  Charges:  $Gait Training: 23-37 mins                    G Codes:      Joslyn Hy PT, [place] [phone] Pager: [phone] 09/23/2015, 10:07 AM

## 2015-09-24 LAB — BASIC METABOLIC PANEL
Anion gap: 6 (ref 5–15)
BUN: 13 mg/dL (ref 6–20)
CALCIUM: 8.1 mg/dL — AB (ref 8.9–10.3)
CHLORIDE: 103 mmol/L (ref 101–111)
CO2: 23 mmol/L (ref 22–32)
CREATININE: 1.17 mg/dL (ref 0.61–1.24)
GFR calc non Af Amer: >60 mL/min (ref >60)
Glucose, Bld: 128 mg/dL — ABNORMAL HIGH (ref 65–99)
Potassium: 3.9 mmol/L (ref 3.5–5.1)
SODIUM: 132 mmol/L — AB (ref 135–145)

## 2015-09-24 LAB — CBC
HCT: 27.2 % — ABNORMAL LOW (ref 39.0–52.0)
HEMOGLOBIN: 9.1 g/dL — AB (ref 13.0–17.0)
MCH: 30.8 pg (ref 26.0–34.0)
MCHC: 33.5 g/dL (ref 30.0–36.0)
MCV: 92.2 fL (ref 78.0–100.0)
Platelets: 160 10*3/uL (ref 150–400)
RBC: 2.95 MIL/uL — ABNORMAL LOW (ref 4.22–5.81)
RDW: 13.5 % (ref 11.5–15.5)
WBC: 10.1 10*3/uL (ref 4.0–10.5)

## 2015-09-24 MED ORDER — RIVAROXABAN 10 MG PO TABS: 10.0000 mg | ORAL_TABLET | Freq: Every day | ORAL | Status: DC

## 2015-09-24 MED ORDER — METHOCARBAMOL 500 MG PO TABS: 500.0000 mg | ORAL_TABLET | Freq: Four times a day (QID) | ORAL | Status: DC | PRN

## 2015-09-24 MED ORDER — OXYCODONE HCL 5 MG PO TABS: 5.0000 mg | ORAL_TABLET | ORAL | Status: DC | PRN

## 2015-09-24 NOTE — Discharge Summary (Signed)
[name], MD   Biagio Borg, PA-C [address], [place], [place]  [address]                             [phone]  PATIENT ID: Christian [name]        MRN:  [id number]          DOB/AGE: 09/16/1946 / 69 y.o.    DISCHARGE SUMMARY  ADMISSION DATE:    09/22/2015 DISCHARGE DATE:   09/24/2015   ADMISSION DIAGNOSIS: RIGHT HIP OSTEOARTHRITIS    DISCHARGE DIAGNOSIS:  RIGHT HIP OSTEOARTHRITIS    ADDITIONAL DIAGNOSIS: Principal Problem:   Osteoarthritis of right hip Active Problems:   S/P total hip arthroplasty  Past Medical History  Diagnosis Date  . Orthostatic hypotension   . Sciatica   . Coronary artery disease 3/12    80% stenosis of diagonal too small for PCI  . Hypercholesterolemia     takes Crestor daily  . Diastolic dysfunction   . Joint pain   . Hemorrhoids   . Cataracts, bilateral     immature  . Heart murmur   . Arthritis     "joints; shoulders; left elbow; right thumb" (05/06/2014)  . Constipation     takes Colace and Metamucil daily  . Hypertension     takes Lisinopril and Imdur daily  . History of bronchitis   . Nocturia     PROCEDURE: Procedure(s): TOTAL HIP ARTHROPLASTY Right on 09/22/2015  CONSULTS: none     HISTORY: Christian [name] is a very pleasant 69 year old white male who is seen today for evaluation of his right hip and groin pain. He states this started previously, but was evaluated on April 16, 2015. It was noted that he had pain in his groin and had previously been told he had spurs in his hip. At that time x-rays were obtained, which showed the hip with decreased joint space and osteophytes and periarticular spurring. He then was seen by Dr. Ernestina Patches on May 2 and underwent a right hip cortisone injection. He overall was doing well and was taking occasionally Advil. He had gotten to the point to where he was comfortable. However, in July of 2016 he started having increasing pain. He got to the point where he was having problems with  ambulation and pain at nighttime. He also noted that he does not have as much range of motion of thehip. He is having problems with his activities of daily living. He comes in today for re-evaluation.    HOSPITAL COURSE:  CLYDELL SPOSITO is a 69 y.o. admitted on 09/22/2015 and found to have a diagnosis of RIGHT HIP OSTEOARTHRITIS.  After appropriate laboratory studies were obtained  they were taken to the operating room on 09/22/2015 and underwent  Procedure(s): TOTAL HIP ARTHROPLASTY  Right.   They were given perioperative antibiotics:  Anti-infectives    Start     Dose/Rate Route Frequency Ordered Stop   09/22/15 1400  ceFAZolin (ANCEF) IVPB 2 g/50 mL premix     2 g 100 mL/hr over 30 Minutes Intravenous Every 6 hours 09/22/15 1215 09/22/15 2026   09/22/15 0630  ceFAZolin (ANCEF) 3 g in dextrose 5 % 50 mL IVPB     3 g 160 mL/hr over 30 Minutes Intravenous To ShortStay Surgical 09/21/15 1436 09/22/15 0735    .  Tolerated the procedure well. .     Toradol was given post op.  POD #1, allowed  out of bed to a chair.  PT for ambulation and exercise program.    IV saline locked.  O2 discontionued.  POD #2, continued PT and ambulation.    The remainder of the hospital course was dedicated to ambulation and strengthening.   The patient was discharged on 2 Days Post-Op in  Stable condition.  Blood products given:none  DIAGNOSTIC STUDIES: Recent vital signs: Patient Vitals for the past 24 hrs:  BP Temp Temp src Pulse Resp SpO2  09/24/15 1000 121/64 mmHg 98.9 F (37.2 C) Oral 90 18 95 %  09/24/15 0647 121/74 mmHg 98.8 F (37.1 C) Oral 88 - 96 %  09/23/15 2041 101/61 mmHg 98.8 F (37.1 C) Oral 98 16 92 %  09/23/15 1300 128/68 mmHg (!) 102.5 F (39.2 C) - 97 16 97 %  09/23/15 1255 - (!) 100.6 F (38.1 C) Oral - - -       Recent laboratory studies:  Recent Labs  09/22/15 0923 09/23/15 0605 09/24/15 0329  WBC  --  8.4 10.1  HGB 12.6* 9.8* 9.1*  HCT 37.0* 29.6* 27.2*  PLT   --  162 160    Recent Labs  09/22/15 0923 09/23/15 0605 09/24/15 0329  NA 139 134* 132*  K 4.0 4.2 3.9  CL  --  103 103  CO2  --  26 23  BUN  --  12 13  CREATININE  --  1.08 1.17  GLUCOSE 109* 121* 128*  CALCIUM  --  8.2* 8.1*   Lab Results  Component Value Date   INR 1.08 09/11/2015   INR 0.99 04/29/2014   INR 0.96 01/07/2010     Recent Radiographic Studies :  Dg Chest 2 View  09/11/2015   CLINICAL DATA:  Arthroplasty.  EXAM: CHEST  2 VIEW  COMPARISON:  04/29/2014 .  FINDINGS: Mediastinum hilar structures normal. Cardiomegaly with normal pulmonary vascularity. Low lung volumes with mild bibasilar atelectasis. No pleural effusion or pneumothorax. No acute bony abnormality .  IMPRESSION: 1. Cardiomegaly.  No pulmonary venous congestion. 2. Low lung volumes with mild bibasilar atelectasis.   Electronically Signed   By: Marcello Moores  Register   On: 09/11/2015 10:08   Dg Hip Port Unilat With Pelvis 1v Right  09/22/2015   CLINICAL DATA:  Post right hip replacement.  EXAM: DG HIP (WITH OR WITHOUT PELVIS) 1V PORT RIGHT  COMPARISON:  None.  FINDINGS: Evidence of recent right total hip arthroplasty with prosthetic components intact and normally located. Skin staples over the lateral soft tissues of the right hip. Mild degenerative change of the left hip and spine.  IMPRESSION: Post right total hip arthroplasty without complicating features.   Electronically Signed   By: Marin Olp M.D.   On: 09/22/2015 11:42    DISCHARGE INSTRUCTIONS: Discharge Instructions    Call MD / Call 911    Complete by:  As directed   If you experience chest pain or shortness of breath, CALL 911 and be transported to the hospital emergency room.  If you develope a fever above 101 F, pus (white drainage) or increased drainage or redness at the wound, or calf pain, call your surgeon's office.     Change dressing    Complete by:  As directed   DO NOT CHANGE DRESSING. KEEP ON TILL SEEN IN THE OFFICE     Constipation  Prevention    Complete by:  As directed   Drink plenty of fluids.  Prune juice may be helpful.  You may  use a stool softener, such as Colace (over the counter) 100 mg twice a day.  Use MiraLax (over the counter) for constipation as needed.     Diet general    Complete by:  As directed      Discharge instructions    Complete by:  As directed   [place] items at home which could result in a fall. This includes throw rugs or furniture in walking pathways ICE to the affected joint every three hours while awake for 30 minutes at a time, for at least the first 3-5 days, and then as needed for pain and swelling.  Continue to use ice for pain and swelling. You may notice swelling that will progress down to the foot and ankle.  This is normal after surgery.  Elevate your leg when you are not up walking on it.   Continue to use the breathing machine you got in the hospital (incentive spirometer) which will help keep your temperature down.  It is common for your temperature to cycle up and down following surgery, especially at night when you are not up moving around and exerting yourself.  The breathing machine keeps your lungs expanded and your temperature down.   DIET:  As you were doing prior to hospitalization, we recommend a well-balanced diet.  DRESSING / WOUND CARE / SHOWERING  Keep the surgical dressing until follow up.  The dressing is water proof, so you can shower without any extra covering.  IF THE DRESSING FALLS OFF or the wound gets wet inside, change the dressing with sterile gauze.  Please use good hand washing techniques before changing the dressing.  Do not use any lotions or creams on the incision until instructed by your surgeon.    ACTIVITY  Increase activity slowly as tolerated, but follow the weight bearing instructions below.   No driving for 6 weeks or until further direction given by your physician.  You cannot drive while taking narcotics.    No lifting or carrying greater than 10 lbs. until further directed by your surgeon. Avoid periods of inactivity such as sitting longer than an hour when not asleep. This helps prevent blood clots.  You may return to work once you are authorized by your doctor.     WEIGHT BEARING   Weight bearing as tolerated with assist device (walker, cane, etc) as directed, use it as long as suggested by your surgeon or therapist, typically at least 4-6 weeks.   EXERCISES  Results after joint replacement surgery are often greatly improved when you follow the exercise, range of motion and muscle strengthening exercises prescribed by your doctor. Safety measures are also important to protect the joint from further injury. Any time any of these exercises cause you to have increased pain or swelling, decrease what you are doing until you are comfortable again and then slowly increase them. If you have problems or questions, call your caregiver or physical therapist for advice.   Rehabilitation is important following a joint replacement. After just a few days of immobilization, the muscles of the leg can become weakened and shrink (atrophy).  These exercises are designed to build up the tone and strength of the thigh and leg muscles and to improve motion. Often times heat used for twenty to thirty minutes before working out will loosen up your tissues and help with improving the range of motion but do not use heat for the first two weeks following surgery (sometimes heat can  increase post-operative swelling).   These exercises can be done on a training (exercise) mat, on the floor, on a table or on a bed. Use whatever works the best and is most comfortable for you.    Use music or television while you are exercising so that the exercises are a pleasant break in your day. This will make your life better with the exercises acting as a break in your routine that you can look forward to.   Perform all exercises about  fifteen times, three times per day or as directed.  You should exercise both the operative leg and the other leg as well.   Exercises include:   Quad Sets - Tighten up the muscle on the front of the thigh (Quad) and hold for 5-10 seconds.   Straight Leg Raises - With your knee straight (if you were given a brace, keep it on), lift the leg to 60 degrees, hold for 3 seconds, and slowly lower the leg.  Perform this exercise against resistance later as your leg gets stronger.  Leg Slides: Lying on your back, slowly slide your foot toward your buttocks, bending your knee up off the floor (only go as far as is comfortable). Then slowly slide your foot back down until your leg is flat on the floor again.  Angel Wings: Lying on your back spread your legs to the side as far apart as you can without causing discomfort.  Hamstring Strength:  Lying on your back, push your heel against the floor with your leg straight by tightening up the muscles of your buttocks.  Repeat, but this time bend your knee to a comfortable angle, and push your heel against the floor.  You may put a pillow under the heel to make it more comfortable if necessary.   A rehabilitation program following joint replacement surgery can speed recovery and prevent re-injury in the future due to weakened muscles. Contact your doctor or a physical therapist for more information on knee rehabilitation.    CONSTIPATION  Constipation is defined medically as fewer than three stools per week and severe constipation as less than one stool per week.  Even if you have a regular bowel pattern at home, your normal regimen is likely to be disrupted due to multiple reasons following surgery.  Combination of anesthesia, postoperative narcotics, change in appetite and fluid intake all can affect your bowels.   YOU MUST use at least one of the following options; they are listed in order of increasing strength to get the job done.  They are all available over  the counter, and you may need to use some, POSSIBLY even all of these options:    Drink plenty of fluids (prune juice may be helpful) and high fiber foods Colace 100 mg by mouth twice a day  Senokot for constipation as directed and as needed Dulcolax (bisacodyl), take with full glass of water  Miralax (polyethylene glycol) once or twice a day as needed.  If you have tried all these things and are unable to have a bowel movement in the first 3-4 days after surgery call either your surgeon or your primary doctor.    If you experience loose stools or diarrhea, hold the medications until you stool forms back up.  If your symptoms do not get better within 1 week or if they get worse, check with your doctor.  If you experience "the worst abdominal pain ever" or develop nausea or vomiting, please contact the office immediately for  further recommendations for treatment.   ITCHING:  If you experience itching with your medications, try taking only a single pain pill, or even half a pain pill at a time.  You can also use Benadryl over the counter for itching or also to help with sleep.   TED HOSE STOCKINGS:  Use stockings on both legs until for at least 2 weeks or as directed by physician office. They may be removed at night for sleeping.  MEDICATIONS:  See your medication summary on the "After Visit Summary" that nursing will review with you.  You may have some home medications which will be placed on hold until you complete the course of blood thinner medication.  It is important for you to complete the blood thinner medication as prescribed.  PRECAUTIONS:  If you experience chest pain or shortness of breath - call 911 immediately for transfer to the hospital emergency department.   If you develop a fever greater that 101 F, purulent drainage from wound, increased redness or drainage from wound, foul odor from the wound/dressing, or calf pain - CONTACT YOUR SURGEON.                                                    FOLLOW-UP APPOINTMENTS:  If you do not already have a post-op appointment, please call the office for an appointment to be seen by your surgeon.  Guidelines for how soon to be seen are listed in your "After Visit Summary", but are typically between 1-4 weeks after surgery.  OTHER INSTRUCTIONS:   Knee Replacement:  Do not place pillow under knee, focus on keeping the knee straight while resting. CPM instructions: 0-90 degrees, 2 hours in the morning, 2 hours in the afternoon, and 2 hours in the evening. Place foam block, curve side up under heel at all times except when in CPM or when walking.  DO NOT modify, tear, cut, or change the foam block in any way.  MAKE SURE YOU:  Understand these instructions.  Get help right away if you are not doing well or get worse.    Thank you for letting us be a part of your medical care team.  It is a privilege we respect greatly.  We hope these instructions will help you stay on track for a fast and full recovery!     Driving restrictions    Complete by:  As directed   No driving for 6 weeks     Follow the hip precautions as taught in Physical Therapy    Complete by:  As directed      Increase activity slowly as tolerated    Complete by:  As directed      Lifting restrictions    Complete by:  As directed   No lifting for 6 weeks     Patient may shower    Complete by:  As directed   [place].  DO NOT REMOVE DRESSING     TED hose    Complete by:  As directed   Use stockings (TED hose) for 2 weeks on RIGHT leg(s).  You may remove them at night for sleeping.     Weight bearing as tolerated    Complete by:  As directed   Laterality:  right  Extremity:  Lower  DISCHARGE MEDICATIONS:     Medication List    STOP taking these medications        aspirin 81 MG tablet      TAKE these medications        isosorbide mononitrate 30 MG 24 hr tablet  Commonly known as:  IMDUR  TAKE 1 TABLET BY MOUTH EVERY  DAY     lisinopril 10 MG tablet  Commonly known as:  PRINIVIL,ZESTRIL  Take 10 mg by mouth daily.     methocarbamol 500 MG tablet  Commonly known as:  ROBAXIN  Take 1 tablet (500 mg total) by mouth every 6 (six) hours as needed for muscle spasms.     oxyCODONE 5 MG immediate release tablet  Commonly known as:  Oxy IR/ROXICODONE  Take 1-2 tablets (5-10 mg total) by mouth every 4 (four) hours as needed for moderate pain or severe pain.     psyllium 28 % packet  Commonly known as:  METAMUCIL SMOOTH TEXTURE  Take 1 packet by mouth 2 (two) times daily.     rivaroxaban 10 MG Tabs tablet  Commonly known as:  XARELTO  Take 1 tablet (10 mg total) by mouth daily with breakfast.     rosuvastatin 10 MG tablet  Commonly known as:  CRESTOR  Take 1 tablet (10 mg total) by mouth daily.     STOOL SOFTENER 100 MG capsule  Generic drug:  docusate sodium  Take 200 mg by mouth daily.        FOLLOW UP VISIT:       Follow-up Information    Follow up with [facility].   Why:  They will contact you to schedule home therapy visits.   Contact information:   [address] [place] [address] [phone]       Follow up with Garald Balding, MD. Schedule an appointment as soon as possible for a visit on 10/05/2015.   Specialty:  Orthopedic Surgery   Contact information:   [place] [place]  [address] [phone]       DISPOSITION:   Home today  CONDITION:  Stable   Mike Craze. [place], [place] [phone]  09/24/2015 12:38 PM

## 2015-09-24 NOTE — Progress Notes (Signed)
Subjective: 2 Days Post-Op Procedure(s) (LRB): TOTAL HIP ARTHROPLASTY (Right) Patient reports pain as mild and moderate.    Objective: Vital signs in last 24 hours: Temp:  [98.8 F (37.1 C)-102.5 F (39.2 C)] 98.9 F (37.2 C) (09/29 1000) Pulse Rate:  [88-98] 90 (09/29 1000) Resp:  [16-18] 18 (09/29 1000) BP: (101-128)/(61-74) 121/64 mmHg (09/29 1000) SpO2:  [92 %-97 %] 95 % (09/29 1000)  Intake/Output from previous day: 09/28 0701 - 09/29 0700 In: 1360 [P.O.:1360] Out: 0  Intake/Output this shift: Total I/O In: 340 [P.O.:340] Out: -    Recent Labs  09/22/15 0923 09/23/15 0605 09/24/15 0329  HGB 12.6* 9.8* 9.1*    Recent Labs  09/23/15 0605 09/24/15 0329  WBC 8.4 10.1  RBC 3.18* 2.95*  HCT 29.6* 27.2*  PLT 162 160    Recent Labs  09/23/15 0605 09/24/15 0329  NA 134* 132*  K 4.2 3.9  CL 103 103  CO2 26 23  BUN 12 13  CREATININE 1.08 1.17  GLUCOSE 121* 128*  CALCIUM 8.2* 8.1*   No results for input(s): LABPT, INR in the last 72 hours.  Neurovascular intact Sensation intact distally Intact pulses distally Dorsiflexion/Plantar flexion intact Incision: dressing C/D/I and no drainage  Assessment/Plan: 2 Days Post-Op Procedure(s) (LRB): TOTAL HIP ARTHROPLASTY (Right) Advance diet Up with therapy Discharge home with home health  [facility] 09/24/2015, 12:29 PM

## 2015-09-24 NOTE — Progress Notes (Signed)
Physical Therapy Treatment Patient Details Name: Christian [name] MRN: [id number] DOB: May 09, 1946 Today's Date: 09/24/2015    History of Present Illness Pt is a 69 y/o M s/p Rt THA. Pt's PMH inlcudes orthostatic hypotension, sciatica, CAD, hemorrhoids, Bil cataracts, constipation, HTN, nocturia.    PT Comments    Christian [name] completed stair training w/ supervision, becoming diaphoretic and fatigued.  He required several standing rest breaks 2/2 Bil UE fatigue. Pt is appropriate for d/c from a mobility standpoint.  Follow Up Recommendations  Home health PT;Supervision for mobility/OOB     Equipment Recommendations  None recommended by PT    Recommendations for Other Services       Precautions / Restrictions Precautions Precautions: Fall;Posterior Hip Precaution Comments: Reviewed post hip precautions.  Pt recalled 3/3 precautions w/o any assist. Required Braces or Orthoses: Knee Immobilizer - Right (per pt, MD d/c this morning) Knee Immobilizer - Right: Other (comment) (not specified in order) Restrictions Weight Bearing Restrictions: Yes RLE Weight Bearing: Weight bearing as tolerated    Mobility  Bed Mobility               General bed mobility comments: Pt sitting in recliner at start and end of PT session  Transfers Overall transfer level: Needs assistance Equipment used: Rolling walker (2 wheeled) Transfers: Sit to/from Stand Sit to Stand: Modified independent (Device/Increase time)         General transfer comment: Pt w/ excellent sit<>stand technique.  Ambulation/Gait Ambulation/Gait assistance: Supervision Ambulation Distance (Feet): 200 Feet Assistive device: Rolling walker (2 wheeled) Gait Pattern/deviations: Step-to pattern;Step-through pattern;Antalgic;Trunk flexed;Decreased weight shift to right   Gait velocity interpretation: Below normal speed for age/gender General Gait Details: Cues for upright posture and to relax shoulders as pt continue to  bear weight through RW to offload Rt LE.  Pt requires multiple standing rest breaks 2/2 fatigue of Bil UEs and fatigue after completing stair training.   Stairs Stairs: Yes Stairs assistance: Supervision Stair Management: One rail Right;Step to pattern;Sideways Number of Stairs: 12 General stair comments: Supervision and verbal cues for technique.  Pt completes stair training safely w/ 2 standing rest breaks.  Pt become diaphoretic.  Wheelchair Mobility    Modified Rankin (Stroke Patients Only)       Balance Overall balance assessment: Needs assistance Sitting-balance support: No upper extremity supported;Feet supported Sitting balance-[name]: Good     Standing balance support: Bilateral upper extremity supported;During functional activity Standing balance-[name]: Fair                      Cognition Arousal/Alertness: Awake/alert Behavior During Therapy: WFL for tasks assessed/performed Overall Cognitive Status: Within Functional Limits for tasks assessed                      Exercises      General Comments General comments (skin integrity, edema, etc.): Wife present at start and end of session.  All questions answered.      Pertinent Vitals/Pain Pain Assessment: Faces Faces Pain Scale: Hurts Christian more Pain Location: Rt hip Pain Descriptors / Indicators: Aching;Sore;Discomfort;Grimacing Pain Intervention(s): Limited activity within patient's tolerance;Monitored during session;Repositioned;Ice applied    Home Living                      Prior Function            PT Goals (current goals can now be found in the care plan section) Acute Rehab PT  Goals Patient Stated Goal: to go home today PT Goal Formulation: With patient/family Time For Goal Achievement: 09/29/15 Potential to Achieve Goals: Good Progress towards PT goals: Progressing toward goals    Frequency  7X/week    PT Plan Current plan remains appropriate     Co-evaluation             End of Session Equipment Utilized During Treatment: Gait belt Activity Tolerance: Patient tolerated treatment well;Patient limited by fatigue Patient left: in chair;with call bell/phone within reach;with family/visitor present     Time: 1228-1251 PT Time Calculation (min) (ACUTE ONLY): 23 min  Charges:  $Gait Training: 23-37 mins                    G Codes:      Christian [name] PT, [place] [phone] Pager: [phone] 09/24/2015, 1:28 PM

## 2015-09-25 DIAGNOSIS — I2510 Atherosclerotic heart disease of native coronary artery without angina pectoris: Secondary | ICD-10-CM | POA: Diagnosis not present

## 2015-09-25 DIAGNOSIS — I951 Orthostatic hypotension: Secondary | ICD-10-CM | POA: Diagnosis not present

## 2015-09-25 DIAGNOSIS — I10 Essential (primary) hypertension: Secondary | ICD-10-CM | POA: Diagnosis not present

## 2015-09-25 DIAGNOSIS — E669 Obesity, unspecified: Secondary | ICD-10-CM | POA: Diagnosis not present

## 2015-09-25 DIAGNOSIS — M5430 Sciatica, unspecified side: Secondary | ICD-10-CM | POA: Diagnosis not present

## 2015-09-25 DIAGNOSIS — Z471 Aftercare following joint replacement surgery: Secondary | ICD-10-CM | POA: Diagnosis not present

## 2015-09-28 DIAGNOSIS — Z471 Aftercare following joint replacement surgery: Secondary | ICD-10-CM | POA: Diagnosis not present

## 2015-09-28 DIAGNOSIS — E669 Obesity, unspecified: Secondary | ICD-10-CM | POA: Diagnosis not present

## 2015-09-28 DIAGNOSIS — M5430 Sciatica, unspecified side: Secondary | ICD-10-CM | POA: Diagnosis not present

## 2015-09-28 DIAGNOSIS — I10 Essential (primary) hypertension: Secondary | ICD-10-CM | POA: Diagnosis not present

## 2015-09-28 DIAGNOSIS — I951 Orthostatic hypotension: Secondary | ICD-10-CM | POA: Diagnosis not present

## 2015-09-28 DIAGNOSIS — I2510 Atherosclerotic heart disease of native coronary artery without angina pectoris: Secondary | ICD-10-CM | POA: Diagnosis not present

## 2015-09-30 DIAGNOSIS — I2510 Atherosclerotic heart disease of native coronary artery without angina pectoris: Secondary | ICD-10-CM | POA: Diagnosis not present

## 2015-09-30 DIAGNOSIS — I10 Essential (primary) hypertension: Secondary | ICD-10-CM | POA: Diagnosis not present

## 2015-09-30 DIAGNOSIS — E669 Obesity, unspecified: Secondary | ICD-10-CM | POA: Diagnosis not present

## 2015-09-30 DIAGNOSIS — Z471 Aftercare following joint replacement surgery: Secondary | ICD-10-CM | POA: Diagnosis not present

## 2015-09-30 DIAGNOSIS — I951 Orthostatic hypotension: Secondary | ICD-10-CM | POA: Diagnosis not present

## 2015-09-30 DIAGNOSIS — M5430 Sciatica, unspecified side: Secondary | ICD-10-CM | POA: Diagnosis not present

## 2015-10-01 DIAGNOSIS — H903 Sensorineural hearing loss, bilateral: Secondary | ICD-10-CM | POA: Diagnosis not present

## 2015-10-01 DIAGNOSIS — H9313 Tinnitus, bilateral: Secondary | ICD-10-CM | POA: Diagnosis not present

## 2015-10-02 DIAGNOSIS — I2510 Atherosclerotic heart disease of native coronary artery without angina pectoris: Secondary | ICD-10-CM | POA: Diagnosis not present

## 2015-10-02 DIAGNOSIS — I10 Essential (primary) hypertension: Secondary | ICD-10-CM | POA: Diagnosis not present

## 2015-10-02 DIAGNOSIS — I951 Orthostatic hypotension: Secondary | ICD-10-CM | POA: Diagnosis not present

## 2015-10-02 DIAGNOSIS — M5430 Sciatica, unspecified side: Secondary | ICD-10-CM | POA: Diagnosis not present

## 2015-10-02 DIAGNOSIS — E669 Obesity, unspecified: Secondary | ICD-10-CM | POA: Diagnosis not present

## 2015-10-02 DIAGNOSIS — Z471 Aftercare following joint replacement surgery: Secondary | ICD-10-CM | POA: Diagnosis not present

## 2015-10-05 DIAGNOSIS — M1611 Unilateral primary osteoarthritis, right hip: Secondary | ICD-10-CM | POA: Diagnosis not present

## 2015-10-06 DIAGNOSIS — E669 Obesity, unspecified: Secondary | ICD-10-CM | POA: Diagnosis not present

## 2015-10-06 DIAGNOSIS — Z471 Aftercare following joint replacement surgery: Secondary | ICD-10-CM | POA: Diagnosis not present

## 2015-10-06 DIAGNOSIS — I951 Orthostatic hypotension: Secondary | ICD-10-CM | POA: Diagnosis not present

## 2015-10-06 DIAGNOSIS — M5430 Sciatica, unspecified side: Secondary | ICD-10-CM | POA: Diagnosis not present

## 2015-10-06 DIAGNOSIS — I2510 Atherosclerotic heart disease of native coronary artery without angina pectoris: Secondary | ICD-10-CM | POA: Diagnosis not present

## 2015-10-06 DIAGNOSIS — I10 Essential (primary) hypertension: Secondary | ICD-10-CM | POA: Diagnosis not present

## 2015-10-08 DIAGNOSIS — I10 Essential (primary) hypertension: Secondary | ICD-10-CM | POA: Diagnosis not present

## 2015-10-08 DIAGNOSIS — I2510 Atherosclerotic heart disease of native coronary artery without angina pectoris: Secondary | ICD-10-CM | POA: Diagnosis not present

## 2015-10-08 DIAGNOSIS — M5430 Sciatica, unspecified side: Secondary | ICD-10-CM | POA: Diagnosis not present

## 2015-10-08 DIAGNOSIS — E669 Obesity, unspecified: Secondary | ICD-10-CM | POA: Diagnosis not present

## 2015-10-08 DIAGNOSIS — I951 Orthostatic hypotension: Secondary | ICD-10-CM | POA: Diagnosis not present

## 2015-10-08 DIAGNOSIS — Z471 Aftercare following joint replacement surgery: Secondary | ICD-10-CM | POA: Diagnosis not present

## 2015-10-09 DIAGNOSIS — I10 Essential (primary) hypertension: Secondary | ICD-10-CM | POA: Diagnosis not present

## 2015-10-09 DIAGNOSIS — Z471 Aftercare following joint replacement surgery: Secondary | ICD-10-CM | POA: Diagnosis not present

## 2015-10-09 DIAGNOSIS — I951 Orthostatic hypotension: Secondary | ICD-10-CM | POA: Diagnosis not present

## 2015-10-09 DIAGNOSIS — I2510 Atherosclerotic heart disease of native coronary artery without angina pectoris: Secondary | ICD-10-CM | POA: Diagnosis not present

## 2015-10-09 DIAGNOSIS — E669 Obesity, unspecified: Secondary | ICD-10-CM | POA: Diagnosis not present

## 2015-10-09 DIAGNOSIS — M5430 Sciatica, unspecified side: Secondary | ICD-10-CM | POA: Diagnosis not present

## 2015-10-30 ENCOUNTER — Other Ambulatory Visit: Payer: Medicare Other

## 2015-11-02 ENCOUNTER — Telehealth: Payer: Self-pay | Admitting: Cardiology

## 2015-11-02 NOTE — Telephone Encounter (Signed)
New message     Pt is due to see Dr Radford Pax Wednesday.  He states he always has labs drawn prior to appt.  He had a hip replacement recently and they drew lots of blood--If he needs cholesterol or anything drawn, please call him today so that he can come in tomorrow and have labs drawn.  He want to discuss results at his appt on wed.  Please call

## 2015-11-02 NOTE — Telephone Encounter (Signed)
Left message to call back  

## 2015-11-02 NOTE — Telephone Encounter (Signed)
Follow up ° ° ° ° °Returning a nurses call °

## 2015-11-02 NOTE — Telephone Encounter (Signed)
Spoke with pt.  He was recently hospitalized for surgery. He has not had lab work done at primary care or any other place since hospitalization.  I told pt I did not know what lab work [name] would want to check.  He will be fasting when he comes to appointment on Wednesday so labs can be drawn if needed.

## 2015-11-03 ENCOUNTER — Other Ambulatory Visit: Payer: Self-pay | Admitting: *Deleted

## 2015-11-03 NOTE — Progress Notes (Signed)
Cardiology Office Note   Date:  11/04/2015   ID:  Christian [name], DOB [birth year], MRN [id number]  PCP:  Donnie Coffin, MD    Chief Complaint  Patient presents with  . Coronary Artery Disease  . Hypertension      History of Present Illness: Christian [name] is a 69 y.o. male with a history of ASCAD, HTN and dyslipidemia who presents today for followup. He is doing well. He denies any chest pain, SOB, DOE, LE edema, dizziness, palpitations or syncope. He has not been riding his bike due to recent orthopedic surgery.     Past Medical History  Diagnosis Date  . Orthostatic hypotension   . Sciatica   . Coronary artery disease 3/12    80% stenosis of diagonal too small for PCI  . Hypercholesterolemia     takes Crestor daily  . Diastolic dysfunction   . Joint pain   . Hemorrhoids   . Cataracts, bilateral     immature  . Heart murmur   . Arthritis     "joints; shoulders; left elbow; right thumb" (05/06/2014)  . Constipation     takes Colace and Metamucil daily  . Hypertension     takes Lisinopril and Imdur daily  . History of bronchitis   . Nocturia     Past Surgical History  Procedure Laterality Date  . Heel spur excision Left   . Toe fusion Right     great toe; plates and screws  . Shoulder surgery Right X 2    "cleaned out spurs"  . Knee arthroscopy Right   . Achilles tendon repair Left     "& fixed a spur"  . Flexible sigmoidoscopy  X 2  . Hand surgery Left     "thumb; cleaned out arthritis"  . Cardiac catheterization  2012  . Colonoscopy    . Total knee arthroplasty Right 05/06/2014  . Tonsillectomy  ~ 1950  . Total knee arthroplasty Right 05/06/2014    Procedure: RIGHT TOTAL KNEE ARTHROPLASTY;  Surgeon: Garald Balding, MD;  Location: [facility];  Service: Orthopedics;  Laterality: Right;  . Total hip arthroplasty Right 09/22/2015    Procedure: TOTAL HIP ARTHROPLASTY;  Surgeon: Garald Balding, MD;  Location: [facility];  Service:  Orthopedics;  Laterality: Right;     Current Outpatient Prescriptions  Medication Sig Dispense Refill  . aspirin 81 MG tablet Take 81 mg by mouth daily.    [name] Kitchen docusate sodium (STOOL SOFTENER) 100 MG capsule Take 200 mg by mouth daily.    . isosorbide mononitrate (IMDUR) 30 MG 24 hr tablet TAKE 1 TABLET BY MOUTH EVERY DAY (Patient taking differently: TAKE 30 MG BY MOUTH EVERY DAY) 90 tablet 0  . lisinopril (PRINIVIL,ZESTRIL) 10 MG tablet Take 10 mg by mouth daily.    . psyllium (METAMUCIL SMOOTH TEXTURE) 28 % packet Take 1 packet by mouth 2 (two) times daily.    . rosuvastatin (CRESTOR) 10 MG tablet Take 1 tablet (10 mg total) by mouth daily. 90 tablet 3   No current facility-administered medications for this visit.    Allergies:   Atorvastatin calcium; Simvastatin; and Pravastatin    Social History:  The patient  reports that he has quit smoking. His smoking use included Cigarettes. He has a 20 pack-year smoking history. He has never used smokeless tobacco. He reports that he drinks alcohol. He reports that he does  not use illicit drugs.   Family History:  The patient's family history includes CVA in his father and mother; Heart disease in his father.    ROS:  Please see the history of present illness.   Otherwise, review of systems are positive for none.   All other systems are reviewed and negative.    PHYSICAL EXAM: VS:  BP 128/90 mmHg  Pulse 78  Ht 5\' 11"  (1.803 m)  Wt 263 lb 3.2 oz (119.387 kg)  BMI 36.73 kg/m2 , BMI Body mass index is 36.73 kg/(m^2). GEN: Well nourished, well developed, in no acute distress HEENT: normal Neck: no JVD, carotid bruits, or masses Cardiac: RRR; no murmurs, rubs, or gallops,no edema  Respiratory:  clear to auscultation bilaterally, normal work of breathing GI: soft, nontender, nondistended, + BS MS: no deformity or atrophy Skin: warm and dry, no rash Neuro:  Strength and sensation are intact Psych: euthymic mood, full affect   EKG:  EKG  was ordered today showing NSR    Recent Labs: 09/11/2015: ALT 9* 09/24/2015: BUN 13; Creatinine, Ser 1.17; Hemoglobin 9.1*; Platelets 160; Potassium 3.9; Sodium 132*    Lipid Panel    Component Value Date/Time   CHOL 124 04/01/2015 0810   TRIG 55.0 04/01/2015 0810   HDL 44.70 04/01/2015 0810   CHOLHDL 3 04/01/2015 0810   VLDL 11.0 04/01/2015 0810   LDLCALC 68 04/01/2015 0810      Wt Readings from Last 3 Encounters:  11/04/15 263 lb 3.2 oz (119.387 kg)  09/22/15 267 lb (121.11 kg)  09/11/15 267 lb 12.8 oz (121.473 kg)     ASSESSMENT/PLAN:  1. ASCAD with no angina  - continue ASA/Imdur  2. HTN - controlled  - he will continue on Lisinopril  3. Dyslipidemia - at goal at 69 - continue Crestor  - check FLP and ALT 4. Orthostatic hypotension - fairly well controlled  5. Asymptomatic bradycardia resolved    Current medicines are reviewed at length with the patient today.  The patient does not have concerns regarding medicines.  The following changes have been made:  no change  Labs/ tests ordered today: See above Assessment and Plan No orders of the defined types were placed in this encounter.     Disposition:   FU with me in 6 months  Signed, Sueanne Margarita, MD  11/04/2015 10:41 AM    [facility] [place], [place], [place]  [address] Phone: [phone]; Fax: [phone]

## 2015-11-04 ENCOUNTER — Ambulatory Visit (INDEPENDENT_AMBULATORY_CARE_PROVIDER_SITE_OTHER): Payer: Medicare Other | Admitting: Cardiology

## 2015-11-04 ENCOUNTER — Encounter: Payer: Self-pay | Admitting: Cardiology

## 2015-11-04 VITALS — BP 128/90 | HR 78 | Ht 71.0 in | Wt 263.2 lb

## 2015-11-04 DIAGNOSIS — I2510 Atherosclerotic heart disease of native coronary artery without angina pectoris: Secondary | ICD-10-CM | POA: Diagnosis not present

## 2015-11-04 DIAGNOSIS — I951 Orthostatic hypotension: Secondary | ICD-10-CM

## 2015-11-04 DIAGNOSIS — I2583 Coronary atherosclerosis due to lipid rich plaque: Principal | ICD-10-CM

## 2015-11-04 DIAGNOSIS — I10 Essential (primary) hypertension: Secondary | ICD-10-CM

## 2015-11-04 DIAGNOSIS — E7800 Pure hypercholesterolemia, unspecified: Secondary | ICD-10-CM | POA: Diagnosis not present

## 2015-11-04 DIAGNOSIS — Z23 Encounter for immunization: Secondary | ICD-10-CM | POA: Diagnosis not present

## 2015-11-04 LAB — HEPATIC FUNCTION PANEL
ALBUMIN: 3.5 g/dL — AB (ref 3.6–5.1)
ALT: 4 U/L — ABNORMAL LOW (ref 9–46)
AST: 13 U/L (ref 10–35)
Alkaline Phosphatase: 94 U/L (ref 40–115)
BILIRUBIN INDIRECT: 0.4 mg/dL (ref 0.2–1.2)
Bilirubin, Direct: 0.1 mg/dL (ref <=0.2)
TOTAL PROTEIN: 6.5 g/dL (ref 6.1–8.1)
Total Bilirubin: 0.5 mg/dL (ref 0.2–1.2)

## 2015-11-04 LAB — LIPID PANEL
CHOL/HDL RATIO: 2.9 ratio (ref <=5.0)
Cholesterol: 115 mg/dL — ABNORMAL LOW (ref 125–200)
HDL: 39 mg/dL — AB (ref >=40)
LDL CALC: 63 mg/dL (ref <130)
TRIGLYCERIDES: 67 mg/dL (ref <150)
VLDL: 13 mg/dL (ref <30)

## 2015-11-04 NOTE — Patient Instructions (Signed)
Medication Instructions:  Your physician recommends that you continue on your current medications as directed. Please refer to the Current Medication list given to you today.   Labwork: TODAY: LFTs, Lipids   Testing/Procedures: None  Follow-Up: Your physician wants you to follow-up in: 6 months with Dr. Turner. You will receive a reminder letter in the mail two months in advance. If you don't receive a letter, please call our office to schedule the follow-up appointment.   Any Other Special Instructions Will Be Listed Below (If Applicable).     If you need a refill on your cardiac medications before your next appointment, please call your pharmacy.   

## 2015-11-08 ENCOUNTER — Other Ambulatory Visit: Payer: Self-pay | Admitting: Cardiology

## 2016-01-11 DIAGNOSIS — I10 Essential (primary) hypertension: Secondary | ICD-10-CM | POA: Diagnosis not present

## 2016-01-11 DIAGNOSIS — I2510 Atherosclerotic heart disease of native coronary artery without angina pectoris: Secondary | ICD-10-CM | POA: Diagnosis not present

## 2016-01-11 DIAGNOSIS — M150 Primary generalized (osteo)arthritis: Secondary | ICD-10-CM | POA: Diagnosis not present

## 2016-01-11 DIAGNOSIS — E7800 Pure hypercholesterolemia, unspecified: Secondary | ICD-10-CM | POA: Diagnosis not present

## 2016-02-11 DIAGNOSIS — T849XXD Unspecified complication of internal orthopedic prosthetic device, implant and graft, subsequent encounter: Secondary | ICD-10-CM | POA: Diagnosis not present

## 2016-02-11 DIAGNOSIS — M1611 Unilateral primary osteoarthritis, right hip: Secondary | ICD-10-CM | POA: Diagnosis not present

## 2016-03-08 ENCOUNTER — Telehealth: Payer: Self-pay | Admitting: Cardiology

## 2016-03-08 DIAGNOSIS — E7800 Pure hypercholesterolemia, unspecified: Secondary | ICD-10-CM

## 2016-03-08 NOTE — Telephone Encounter (Signed)
New message    Pt wants an order for lab work for the 04/27/16

## 2016-03-08 NOTE — Telephone Encounter (Signed)
Needs FLp and ALT

## 2016-03-08 NOTE — Telephone Encounter (Signed)
Patient has OV with [name] 5/5. Patient requests lab work prior to appointment.  To [name] for lab orders if necessary.

## 2016-03-08 NOTE — Telephone Encounter (Signed)
Scheduled FASTING lab appointment 5/3. FLP and ALT ordered.  Patient agrees with treatment plan.

## 2016-03-15 DIAGNOSIS — M1611 Unilateral primary osteoarthritis, right hip: Secondary | ICD-10-CM | POA: Diagnosis not present

## 2016-03-15 DIAGNOSIS — T849XXD Unspecified complication of internal orthopedic prosthetic device, implant and graft, subsequent encounter: Secondary | ICD-10-CM | POA: Diagnosis not present

## 2016-04-07 ENCOUNTER — Other Ambulatory Visit: Payer: Self-pay | Admitting: Cardiology

## 2016-04-22 DIAGNOSIS — H40013 Open angle with borderline findings, low risk, bilateral: Secondary | ICD-10-CM | POA: Diagnosis not present

## 2016-04-22 DIAGNOSIS — H2513 Age-related nuclear cataract, bilateral: Secondary | ICD-10-CM | POA: Diagnosis not present

## 2016-04-22 DIAGNOSIS — H47329 Drusen of optic disc, unspecified eye: Secondary | ICD-10-CM | POA: Diagnosis not present

## 2016-04-22 DIAGNOSIS — H25013 Cortical age-related cataract, bilateral: Secondary | ICD-10-CM | POA: Diagnosis not present

## 2016-04-27 ENCOUNTER — Other Ambulatory Visit (INDEPENDENT_AMBULATORY_CARE_PROVIDER_SITE_OTHER): Payer: Medicare Other | Admitting: *Deleted

## 2016-04-27 DIAGNOSIS — E7800 Pure hypercholesterolemia, unspecified: Secondary | ICD-10-CM | POA: Diagnosis not present

## 2016-04-27 LAB — LIPID PANEL
CHOL/HDL RATIO: 2.9 ratio (ref <=5.0)
CHOLESTEROL: 133 mg/dL (ref 125–200)
HDL: 46 mg/dL (ref >=40)
LDL Cholesterol: 73 mg/dL (ref <130)
TRIGLYCERIDES: 69 mg/dL (ref <150)
VLDL: 14 mg/dL (ref <30)

## 2016-04-27 LAB — ALT: ALT: 7 U/L — ABNORMAL LOW (ref 9–46)

## 2016-04-27 NOTE — Addendum Note (Signed)
Addended by: Eulis Foster on: 04/27/2016 08:25 AM   Modules accepted: Orders

## 2016-04-29 ENCOUNTER — Ambulatory Visit: Payer: Medicare Other | Admitting: Cardiology

## 2016-05-03 DIAGNOSIS — M1611 Unilateral primary osteoarthritis, right hip: Secondary | ICD-10-CM | POA: Diagnosis not present

## 2016-05-08 NOTE — Progress Notes (Addendum)
Cardiology Office Note    Date:  05/09/2016   ID:  TIAN DOUDS, DOB 1946/08/28, MRN [id number]  PCP:  Donnie Coffin, MD  Cardiologist:  Fransico Him, MD   Chief Complaint  Patient presents with  . Coronary Artery Disease  . Hypertension  . Hyperlipidemia    History of Present Illness:  Christian [name] is a 70 y.o. male with a history of ASCAD with 80% D1 too small for PCI, HTN and dyslipidemia who presents today for followup. He is doing well but has noticed some DOE when going up an incline but can ride his bike for 50 minutes without any problems.  He denies any chest pain, palpitations or syncope. He has had some dizzy spells and yesterday his BP was 96/87mmHg during a dizzy spell.  Occasionally he will have some pedal edema.     Past Medical History  Diagnosis Date  . Orthostatic hypotension   . Sciatica   . Coronary artery disease 3/12    80% stenosis of diagonal too small for PCI  . Hypercholesterolemia     takes Crestor daily  . Diastolic dysfunction   . Joint pain   . Hemorrhoids   . Cataracts, bilateral     immature  . Heart murmur   . Arthritis     "joints; shoulders; left elbow; right thumb" (05/06/2014)  . Constipation     takes Colace and Metamucil daily  . Hypertension     takes Lisinopril and Imdur daily  . History of bronchitis   . Nocturia     Past Surgical History  Procedure Laterality Date  . Heel spur excision Left   . Toe fusion Right     great toe; plates and screws  . Shoulder surgery Right X 2    "cleaned out spurs"  . Knee arthroscopy Right   . Achilles tendon repair Left     "& fixed a spur"  . Flexible sigmoidoscopy  X 2  . Hand surgery Left     "thumb; cleaned out arthritis"  . Cardiac catheterization  2012  . Colonoscopy    . Total knee arthroplasty Right 05/06/2014  . Tonsillectomy  ~ 1950  . Total knee arthroplasty Right 05/06/2014    Procedure: RIGHT TOTAL KNEE ARTHROPLASTY;  Surgeon: Garald Balding, MD;   Location: [facility];  Service: Orthopedics;  Laterality: Right;  . Total hip arthroplasty Right 09/22/2015    Procedure: TOTAL HIP ARTHROPLASTY;  Surgeon: Garald Balding, MD;  Location: [facility];  Service: Orthopedics;  Laterality: Right;    Current Medications: Outpatient Prescriptions Prior to Visit  Medication Sig Dispense Refill  . aspirin 81 MG tablet Take 81 mg by mouth daily.    [name] Kitchen docusate sodium (STOOL SOFTENER) 100 MG capsule Take 200 mg by mouth daily.    . isosorbide mononitrate (IMDUR) 30 MG 24 hr tablet TAKE 1 TABLET BY MOUTH EVERY DAY 90 tablet 3  . lisinopril (PRINIVIL,ZESTRIL) 10 MG tablet Take 10 mg by mouth daily.    . psyllium (METAMUCIL SMOOTH TEXTURE) 28 % packet Take 1 packet by mouth 2 (two) times daily.    . rosuvastatin (CRESTOR) 10 MG tablet TAKE 1 TABLET(10 MG) BY MOUTH DAILY 90 tablet 0   No facility-administered medications prior to visit.     Allergies:   Atorvastatin calcium; Simvastatin; and Pravastatin   Social History   Social History  . Marital Status: Married    Spouse Name: N/A  . Number of Children:  N/A  . Years of Education: N/A   Social History Main Topics  . Smoking status: Former Smoker -- 2.00 packs/day for 10 years    Types: Cigarettes  . Smokeless tobacco: Never Used     Comment: quit smoking in 1981  . Alcohol Use: Yes     Comment: "drink a beer sometimes once/month"  . Drug Use: No  . Sexual Activity: No   Other Topics Concern  . None   Social History Narrative     Family History:  The patient's family history includes CVA in his father and mother; Heart disease in his father.   ROS:   Please see the history of present illness.    ROS All other systems reviewed and are negative.   PHYSICAL EXAM:   VS:  BP 120/80 mmHg  Pulse 68  Ht 5\' 11"  (1.803 m)  Wt 268 lb (121.564 kg)  BMI 37.39 kg/m2  SpO2 96%   GEN: Well nourished, well developed, in no acute distress HEENT: normal Neck: no JVD, carotid bruits, or  masses Cardiac: RRR; no murmurs, rubs, or gallops,no edema.  Intact distal pulses bilaterally.  Respiratory:  clear to auscultation bilaterally, normal work of breathing GI: soft, nontender, nondistended, + BS MS: no deformity or atrophy Skin: warm and dry, no rash Neuro:  Alert and Oriented x 3, Strength and sensation are intact Psych: euthymic mood, full affect  Wt Readings from Last 3 Encounters:  05/09/16 268 lb (121.564 kg)  11/04/15 263 lb 3.2 oz (119.387 kg)  09/22/15 267 lb (121.11 kg)      Studies/Labs Reviewed:   EKG:  EKG is ordered today and showed NSR with PACs with anterior infarct   Recent Labs: 09/24/2015: BUN 13; Creatinine, Ser 1.17; Hemoglobin 9.1*; Platelets 160; Potassium 3.9; Sodium 132* 04/27/2016: ALT 7*   Lipid Panel    Component Value Date/Time   CHOL 133 04/27/2016 0826   TRIG 69 04/27/2016 0826   HDL 46 04/27/2016 0826   CHOLHDL 2.9 04/27/2016 0826   VLDL 14 04/27/2016 0826   LDLCALC 73 04/27/2016 0826    Additional studies/ records that were reviewed today include:  none    ASSESSMENT:    1. Coronary artery disease due to lipid rich plaque   2. Essential hypertension   3. Hypercholesterolemia      PLAN:  In order of problems listed above:  1. ASCAD with 80% D1 too small for PCI on medical management with no angina.  Continue ASA/long acting nitrate//statin. 2. HTN - Bp controlled on ACE I but having some dizzy spells with soft BP.  Will decrease Lisinopril to 5mg  daily.  3. Hyperlipidemia - LDL goal < 70.  LDL 73 earlier this month.  Continue Crestor at current dose.  Counseled on low fat, low carb diet.     Medication Adjustments/Labs and Tests Ordered: Current medicines are reviewed at length with the patient today.  Concerns regarding medicines are outlined above.  Medication changes, Labs and Tests ordered today are listed in the Patient Instructions below.  There are no Patient Instructions on file for this  visit.   Signed, Fransico Him, MD  05/09/2016 9:08 AM    [facility] [place], [place], [place]  [address] Phone: [phone]; Fax: [phone]

## 2016-05-09 ENCOUNTER — Encounter: Payer: Self-pay | Admitting: Cardiology

## 2016-05-09 ENCOUNTER — Ambulatory Visit (INDEPENDENT_AMBULATORY_CARE_PROVIDER_SITE_OTHER): Payer: Medicare Other | Admitting: Cardiology

## 2016-05-09 VITALS — BP 120/80 | HR 68 | Ht 71.0 in | Wt 268.0 lb

## 2016-05-09 DIAGNOSIS — I2510 Atherosclerotic heart disease of native coronary artery without angina pectoris: Secondary | ICD-10-CM | POA: Diagnosis not present

## 2016-05-09 DIAGNOSIS — I10 Essential (primary) hypertension: Secondary | ICD-10-CM | POA: Diagnosis not present

## 2016-05-09 DIAGNOSIS — I2583 Coronary atherosclerosis due to lipid rich plaque: Principal | ICD-10-CM

## 2016-05-09 DIAGNOSIS — E7800 Pure hypercholesterolemia, unspecified: Secondary | ICD-10-CM

## 2016-05-09 MED ORDER — LISINOPRIL 5 MG PO TABS: 5.0000 mg | ORAL_TABLET | Freq: Every day | ORAL | Status: DC

## 2016-05-09 NOTE — Patient Instructions (Signed)
Medication Instructions:  Your physician has recommended you make the following change in your medication: 1) DECREASE LISINOPRIL to 5 mg daily  Labwork: None  Testing/Procedures: None  Follow-Up: Your physician wants you to follow-up in: 6 months with [name]. You will receive a reminder letter in the mail two months in advance. If you don't receive a letter, please call our office to schedule the follow-up appointment.   Any Other Special Instructions Will Be Listed Below (If Applicable).     If you need a refill on your cardiac medications before your next appointment, please call your pharmacy.

## 2016-05-29 ENCOUNTER — Encounter: Payer: Self-pay | Admitting: Cardiology

## 2016-05-30 ENCOUNTER — Telehealth: Payer: Self-pay

## 2016-05-30 NOTE — Telephone Encounter (Signed)
Message             ----- Message -----     From: [name]     Sent: 05/29/2016 12:22 PM      To: [facility] Triage    Subject: Non-Urgent Medical Question                 I need Dr Radford Pax to OK me to changing my blood pressure medication back to 10mg . My pressure has been running high. I have been taking it 3 times day and keeping a record of it.    Received MyChart message from patient. Upon return call, he st he checks his BP TID. Systolic pressure ranges from 119-149. He requests to increase his lisinopril back to 10 mg daily. Patient st he has not had anymore dizzy spells since decreasing medication.  Reiterated to patient that BP is not concerning, especially since 123456 systolic was an outlier (the second highest was 135). Instructed patient to check BP daily 2 hours after morning medications only and to call back Friday afternoon with results. Patient was grateful for call.

## 2016-06-03 ENCOUNTER — Telehealth: Payer: Self-pay | Admitting: Cardiology

## 2016-06-03 NOTE — Telephone Encounter (Signed)
New Message  Pt c/o BP issue: STAT if pt c/o blurred vision, one-sided weakness or slurred speech  1. What are your last 5 BP readings? 6/6) 108/73  6/7) 110/77 6/8) 109/75 6/9)102/70  2. Are you having any other symptoms (ex. Dizziness, headache, blurred vision, passed out)? NO  3. What is your BP issue? Pt states he doesn't think he needs to increase is bp meds. He states he took his bp for the pass four days and it seems to be fine. Pt states he wanted to call and inform RN, but if there was a need for a call back please do so. Please call back to advise.

## 2016-06-03 NOTE — Telephone Encounter (Signed)
Instructed patient to continue current medications and to call if he has any further questions, concerns, or symptoms. Patient was grateful for call.

## 2016-07-08 ENCOUNTER — Other Ambulatory Visit: Payer: Self-pay | Admitting: Cardiology

## 2016-07-13 DIAGNOSIS — I10 Essential (primary) hypertension: Secondary | ICD-10-CM | POA: Diagnosis not present

## 2016-07-13 DIAGNOSIS — M150 Primary generalized (osteo)arthritis: Secondary | ICD-10-CM | POA: Diagnosis not present

## 2016-07-13 DIAGNOSIS — E7800 Pure hypercholesterolemia, unspecified: Secondary | ICD-10-CM | POA: Diagnosis not present

## 2016-07-13 DIAGNOSIS — E669 Obesity, unspecified: Secondary | ICD-10-CM | POA: Diagnosis not present

## 2016-07-13 DIAGNOSIS — I2510 Atherosclerotic heart disease of native coronary artery without angina pectoris: Secondary | ICD-10-CM | POA: Diagnosis not present

## 2016-07-13 DIAGNOSIS — Z125 Encounter for screening for malignant neoplasm of prostate: Secondary | ICD-10-CM | POA: Diagnosis not present

## 2016-09-29 DIAGNOSIS — C44519 Basal cell carcinoma of skin of other part of trunk: Secondary | ICD-10-CM | POA: Diagnosis not present

## 2016-09-29 DIAGNOSIS — D485 Neoplasm of uncertain behavior of skin: Secondary | ICD-10-CM | POA: Diagnosis not present

## 2016-09-29 DIAGNOSIS — Z23 Encounter for immunization: Secondary | ICD-10-CM | POA: Diagnosis not present

## 2016-09-29 DIAGNOSIS — L918 Other hypertrophic disorders of the skin: Secondary | ICD-10-CM | POA: Diagnosis not present

## 2016-10-26 ENCOUNTER — Other Ambulatory Visit: Payer: Self-pay | Admitting: Cardiology

## 2016-10-26 DIAGNOSIS — C44519 Basal cell carcinoma of skin of other part of trunk: Secondary | ICD-10-CM | POA: Diagnosis not present

## 2016-10-26 NOTE — Telephone Encounter (Signed)
lisinopril (PRINIVIL,ZESTRIL) 5 MG tablet  Medication  Date: 05/09/2016 Department: [facility] Office Ordering/Authorizing: Sueanne Margarita, MD  Order Providers   Prescribing Provider Encounter Provider  Sueanne Margarita, MD Sueanne Margarita, MD  Medication Detail    Disp Refills Start End   lisinopril (PRINIVIL,ZESTRIL) 5 MG tablet 30 tablet 11 05/09/2016    Sig - Route: Take 1 tablet (5 mg total) by mouth daily. - Oral   E-Prescribing Status: Receipt confirmed by pharmacy (05/09/2016 9:24 AM EDT)   Pharmacy   [id number] - [place], [address] AT [address]

## 2016-10-31 DIAGNOSIS — H35033 Hypertensive retinopathy, bilateral: Secondary | ICD-10-CM | POA: Diagnosis not present

## 2016-10-31 DIAGNOSIS — H40013 Open angle with borderline findings, low risk, bilateral: Secondary | ICD-10-CM | POA: Diagnosis not present

## 2016-10-31 DIAGNOSIS — H47329 Drusen of optic disc, unspecified eye: Secondary | ICD-10-CM | POA: Diagnosis not present

## 2016-10-31 DIAGNOSIS — H2513 Age-related nuclear cataract, bilateral: Secondary | ICD-10-CM | POA: Diagnosis not present

## 2016-11-04 ENCOUNTER — Encounter (INDEPENDENT_AMBULATORY_CARE_PROVIDER_SITE_OTHER): Payer: Self-pay | Admitting: Orthopaedic Surgery

## 2016-11-04 ENCOUNTER — Ambulatory Visit (INDEPENDENT_AMBULATORY_CARE_PROVIDER_SITE_OTHER): Payer: Medicare Other | Admitting: Orthopaedic Surgery

## 2016-11-04 VITALS — BP 84/56 | HR 93 | Resp 12 | Ht 71.0 in | Wt 265.0 lb

## 2016-11-04 DIAGNOSIS — I2583 Coronary atherosclerosis due to lipid rich plaque: Secondary | ICD-10-CM | POA: Diagnosis not present

## 2016-11-04 DIAGNOSIS — M1711 Unilateral primary osteoarthritis, right knee: Secondary | ICD-10-CM | POA: Diagnosis not present

## 2016-11-04 DIAGNOSIS — M1611 Unilateral primary osteoarthritis, right hip: Secondary | ICD-10-CM | POA: Diagnosis not present

## 2016-11-04 DIAGNOSIS — I2510 Atherosclerotic heart disease of native coronary artery without angina pectoris: Secondary | ICD-10-CM | POA: Diagnosis not present

## 2016-11-04 NOTE — Progress Notes (Signed)
Office Visit Note   Patient: Christian [name]           Date of Birth: 03/22/1946           MRN: [id number] Visit Date: 11/04/2016              Requested by: L.Donnie Coffin, MD [place] [address] [employer] [address] [place], [place] [address] PCP: Donnie Coffin, MD   Assessment & Plan: Visit Diagnoses:  1. Primary osteoarthritis of right hip   2. Primary osteoarthritis of right knee     Plan: f/u prn-continue with exercises  Follow-Up Instructions: Return if symptoms worsen or fail to improve, for f/u THR and TKR.   Orders:  No orders of the defined types were placed in this encounter.  No orders of the defined types were placed in this encounter.     Procedures: No procedures performed   Clinical Data: No additional findings. Status post right total knee and right total hip replacement as outlined and doing very well he doesn't have any complaints. He continues to exercise. He is not using any ambulatory aid or medicine in reference to his  right hip or right knee.   Subjective: Chief Complaint  Patient presents with  . Right Hip - Follow-up  . Right Knee - Follow-up    Pt is 1 year post op of Right THA in 08/2015 and R knee 04/2014. Pt states he has no pain, no restrictions and rarely has a twinge of pain in Right knee.   Pt has been sick with a bad cold, B/P was low and pt states he has been fatigued. We talked about him eating more protein and drinking enough fluids. He will go home and rest today.    Review of Systems  Constitutional: Negative.   HENT: Negative.   Eyes: Negative.   Respiratory: Negative.   Cardiovascular: Negative.   Gastrointestinal: Negative.   Endocrine: Negative.   Genitourinary: Negative.   Musculoskeletal: Negative.   Skin: Negative.   Allergic/Immunologic: Negative.   Neurological: Negative.   Hematological: Negative.   Psychiatric/Behavioral: Negative.      Objective: Vital Signs: BP (!) 84/56   Pulse 93   Resp 12   Ht 5\' 11"   (1.803 m)   Wt 265 lb (120.2 kg)   BMI 36.96 kg/m   Physical Exam  Ortho Exam name of the right knee reveals full extension flex just over 100 without evidence of instability of the knee is not hot red or swollen neurologically is intact.  There is painless range of motion of his right hip with internal and external rotation no localized areas of tenderness. Specialty Comments:  No specialty comments available.  Imaging: No results found.   PMFS History: Patient Active Problem List   Diagnosis Date Noted  . Osteoarthritis of right hip 09/22/2015  . S/P total hip arthroplasty 09/22/2015  . Obesity 05/08/2014  . Osteoarthritis of knee 05/08/2014  . S/P total knee replacement using cement 05/06/2014  . Knee pain 04/08/2014  . Preoperative clearance 04/08/2014  . Coronary artery disease   . Diastolic dysfunction   . Hypertension   . Orthostatic hypotension   . Hypercholesterolemia    Past Medical History:  Diagnosis Date  . Arthritis    "joints; shoulders; left elbow; right thumb" (05/06/2014)  . Cataracts, bilateral    immature  . Constipation    takes Colace and Metamucil daily  . Coronary artery disease 3/12   80% stenosis of diagonal too small  for PCI  . Diastolic dysfunction   . Heart murmur   . Hemorrhoids   . History of bronchitis   . Hypercholesterolemia    takes Crestor daily  . Hypertension    takes Lisinopril and Imdur daily  . Joint pain   . Nocturia   . Orthostatic hypotension   . Sciatica     Family History  Problem Relation Age of Onset  . CVA Mother   . CVA Father   . Heart disease Father     Past Surgical History:  Procedure Laterality Date  . ACHILLES TENDON REPAIR Left    "& fixed a spur"  . CARDIAC CATHETERIZATION  2012  . COLONOSCOPY    . FLEXIBLE SIGMOIDOSCOPY  X 2  . HAND SURGERY Left    "thumb; cleaned out arthritis"  . HEEL SPUR EXCISION Left   . KNEE ARTHROSCOPY Right   . SHOULDER SURGERY Right X 2   "cleaned out spurs"    . TOE FUSION Right    great toe; plates and screws  . TONSILLECTOMY  ~ 1950  . TOTAL HIP ARTHROPLASTY Right 09/22/2015   Procedure: TOTAL HIP ARTHROPLASTY;  Surgeon: Garald Balding, MD;  Location: [facility];  Service: Orthopedics;  Laterality: Right;  . TOTAL KNEE ARTHROPLASTY Right 05/06/2014  . TOTAL KNEE ARTHROPLASTY Right 05/06/2014   Procedure: RIGHT TOTAL KNEE ARTHROPLASTY;  Surgeon: Garald Balding, MD;  Location: [facility];  Service: Orthopedics;  Laterality: Right;   Social History   Occupational History  . Not on file.   Social History Main Topics  . Smoking status: Former Smoker    Packs/day: 2.00    Years: 10.00    Types: Cigarettes  . Smokeless tobacco: Never Used     Comment: quit smoking in 1981  . Alcohol use Yes     Comment: "drink a beer sometimes once/month"  . Drug use: No  . Sexual activity: No

## 2016-11-10 ENCOUNTER — Encounter: Payer: Self-pay | Admitting: Cardiology

## 2016-11-11 ENCOUNTER — Other Ambulatory Visit: Payer: Self-pay | Admitting: Cardiology

## 2016-11-22 ENCOUNTER — Encounter: Payer: Self-pay | Admitting: Cardiology

## 2016-11-23 ENCOUNTER — Ambulatory Visit: Payer: Medicare Other | Admitting: Cardiology

## 2016-11-28 NOTE — Progress Notes (Signed)
Cardiology Office Note   Date:  11/29/2016   ID:  Christian [name], DOB Feb 12, 1946, MRN [id number]  PCP:  Donnie Coffin, MD  Cardiologist:  [name]    Chief Complaint  Patient presents with  . Follow-up  . Chest Pain    pt states when walking up an hill he gets SOB and don't know if its his chest or what  . Shortness of Breath    some      History of Present Illness: Christian [name] is a 70 y.o. male who presents for CAD.  He has a history of ASCAD with 80% D1 too small for PCI, normal EF,  HTN and dyslipidemia who presents today for followup.  Today he does state his DOE has increased now after starting up hill he becomes SOB, stopping at times then when he stops it takes 10 min or so to recover.  He believes he has some chest pressure with this as well.   He also had elevated BP and increased his lisinopril but the BP came down so back at 5 mg.  Now still labile with BP 85/56 to 134/89.  No recent wt loss.     Will check EKG and ortho statics.   His lipids have been stable.   Past Medical History:  Diagnosis Date  . Arthritis    "joints; shoulders; left elbow; right thumb" (05/06/2014)  . Cataracts, bilateral    immature  . Constipation    takes Colace and Metamucil daily  . Coronary artery disease 3/12   80% stenosis of diagonal too small for PCI  . Diastolic dysfunction   . Heart murmur   . Hemorrhoids   . History of bronchitis   . Hypercholesterolemia    takes Crestor daily  . Hypertension    takes Lisinopril and Imdur daily  . Joint pain   . Nocturia   . Orthostatic hypotension   . Sciatica     Past Surgical History:  Procedure Laterality Date  . ACHILLES TENDON REPAIR Left    "& fixed a spur"  . CARDIAC CATHETERIZATION  2012  . COLONOSCOPY    . FLEXIBLE SIGMOIDOSCOPY  X 2  . HAND SURGERY Left    "thumb; cleaned out arthritis"  . HEEL SPUR EXCISION Left   . KNEE ARTHROSCOPY Right   . SHOULDER SURGERY Right X 2   "cleaned out spurs"  . TOE  FUSION Right    great toe; plates and screws  . TONSILLECTOMY  ~ 1950  . TOTAL HIP ARTHROPLASTY Right 09/22/2015   Procedure: TOTAL HIP ARTHROPLASTY;  Surgeon: Garald Balding, MD;  Location: [facility];  Service: Orthopedics;  Laterality: Right;  . TOTAL KNEE ARTHROPLASTY Right 05/06/2014  . TOTAL KNEE ARTHROPLASTY Right 05/06/2014   Procedure: RIGHT TOTAL KNEE ARTHROPLASTY;  Surgeon: Garald Balding, MD;  Location: [facility];  Service: Orthopedics;  Laterality: Right;     Current Outpatient Prescriptions  Medication Sig Dispense Refill  . aspirin 81 MG tablet Take 81 mg by mouth daily.    . diclofenac (VOLTAREN) 50 MG EC tablet Take 50 mg by mouth as directed.    . docusate sodium (STOOL SOFTENER) 100 MG capsule Take 200 mg by mouth daily.    [name] Kitchen doxycycline (VIBRAMYCIN) 100 MG capsule     . FLUZONE HIGH-DOSE 0.5 ML SUSY     . isosorbide mononitrate (IMDUR) 30 MG 24 hr tablet TAKE 1 TABLET BY MOUTH EVERY DAY 90 tablet 2  .  lisinopril (PRINIVIL,ZESTRIL) 5 MG tablet Take 1 tablet (5 mg total) by mouth daily. 30 tablet 11  . psyllium (METAMUCIL SMOOTH TEXTURE) 28 % packet Take 1 packet by mouth 2 (two) times daily.    . rosuvastatin (CRESTOR) 10 MG tablet TAKE 1 TABLET(10 MG) BY MOUTH DAILY 90 tablet 0  . rosuvastatin (CRESTOR) 10 MG tablet TAKE 1 TABLET(10 MG) BY MOUTH DAILY 90 tablet 2   No current facility-administered medications for this visit.     Allergies:   Atorvastatin calcium [atorvastatin]; Simvastatin; and Pravastatin    Social History:  The patient  reports that he has quit smoking. His smoking use included Cigarettes. He has a 20.00 pack-year smoking history. He has never used smokeless tobacco. He reports that he drinks alcohol. He reports that he does not use drugs.   Family History:  The patient's family history includes CVA in his father and mother; Heart disease in his father.    ROS:  General:no colds or fevers, no weight changes Skin:no rashes or ulcers HEENT:no  blurred vision, no congestion CV:see HPI PUL:see HPI GI:no diarrhea constipation or melena, no indigestion GU:no hematuria, no dysuria MS:no joint pain, no claudication Neuro:no syncope, some lightheadedness going from sitting to standing Endo:no diabetes, no thyroid disease  Wt Readings from Last 3 Encounters:  11/29/16 270 lb 1.9 oz (122.5 kg)  11/04/16 265 lb (120.2 kg)  05/09/16 268 lb (121.6 kg)     PHYSICAL EXAM: VS:  BP 120/90   Pulse 72   Ht 5\' 11"  (1.803 m)   Wt 270 lb 1.9 oz (122.5 kg)   SpO2 99%   BMI 37.67 kg/m  , BMI Body mass index is 37.67 kg/m. General:Pleasant affect, NAD Skin:Warm and dry, brisk capillary refill HEENT:normocephalic, sclera clear, mucus membranes moist Neck:supple, no JVD, no bruits  Heart:S1S2 RRR without murmur, gallup, rub or click Lungs:clear without rales, rhonchi, or wheezes [id number], non tender, + BS, do not palpate liver spleen or masses Ext:no lower ext edema, 2-3+ pedal pulses, 2+ radial pulses Neuro:alert and oriented X 3, MAE, follows commands, + facial symmetry  BP lying 114/78  sitting 118/87 and standing 99/74  With feeling swimmy headed and standing 3 min 107/77  EKG:  EKG is ordered today. The ekg ordered today demonstrates Sr HR 66 no changes from EKG 04/2016.     Recent Labs: 04/27/2016: ALT 7    Lipid Panel    Component Value Date/Time   CHOL 133 04/27/2016 0826   TRIG 69 04/27/2016 0826   HDL 46 04/27/2016 0826   CHOLHDL 2.9 04/27/2016 0826   VLDL 14 04/27/2016 0826   LDLCALC 73 04/27/2016 0826       Other studies Reviewed: Additional studies/ records that were reviewed today include: . Cardiac cath: RESULTS: 1. Left main coronary is widely patent and bifurcates left anterior     descending artery and left circumflex artery. 2. Left anterior descending artery is widely patent throughout its     course with a 20-30% eccentric lesion in the midportion of the LAD.     The LAD gives rise to two diagonal  branches.  Both diagonals are     very small.  The first diagonal has an 80% mid stenosis.  The     second diagonal is patent, but very small.  The left circumflex is widely patent throughout its course.  It gives rise to a first obtuse marginal branch which was small-to-moderate size and patent.  Just after the takeoff  of the first obtuse marginal, there was a 20-30% eccentric lesion in the left circumflex.  The ongoing circumflex gives rise to a second obtuse marginal branch which is a large vessel and widely patent.  The right coronary is widely patent throughout its course distally and bifurcates into posterior descending artery and posterior lateral artery.  There is an eccentric lesion of 20-30% in the proximal RCA. 1. Left ventriculography shows normal LV function, EF 60%, LV pressure     109/4 mmHg aortic pressure 108/70 mmHg, LVEDP 10 mmHg.  ASSESSMENT: 1. 80% small diagonal one too small for PCI. 2. Nonobstructive disease in left circumflex RCA and LAD. 3. Normal LV function. 4. Normal LVEDP.  Echo in 2012 with EF 69% and mil MR   myoview 2012 low risk.   ASSESSMENT AND PLAN:  1. ASCAD with 80% D1 too small for PCI on medical management with increasing DOE and mild chest pressure- angina. Will check exercise myoview.  Continue ASA/long acting nitrate//statin.  Will review with [name] and follow up with Pt.   2. HTN - Bp  Labile will decrease lisinopril to 2.5 mg daily and if continues to be low will stop.  3. Hyperlipidemia - LDL goal < 70.  LDL 73 earlier this month.  Continue Crestor at current dose.  Counseled on low fat, low carb diet.  Current medicines are reviewed with the patient today.  The patient Has no concerns regarding medicines.  The following changes have been made:  See above Labs/ tests ordered today include:see above  Disposition:   FU:  see above  Signed, Cecilie Kicks, NP  11/29/2016 9:07 AM    [facility] [address], [place], [place], [place] Phone: [phone]; Fax: [phone]

## 2016-11-29 ENCOUNTER — Telehealth (HOSPITAL_COMMUNITY): Payer: Self-pay | Admitting: *Deleted

## 2016-11-29 ENCOUNTER — Ambulatory Visit: Payer: Medicare Other | Admitting: Cardiology

## 2016-11-29 ENCOUNTER — Encounter: Payer: Self-pay | Admitting: Cardiology

## 2016-11-29 ENCOUNTER — Ambulatory Visit (INDEPENDENT_AMBULATORY_CARE_PROVIDER_SITE_OTHER): Payer: Medicare Other | Admitting: Cardiology

## 2016-11-29 VITALS — BP 120/90 | HR 72 | Ht 71.0 in | Wt 270.1 lb

## 2016-11-29 DIAGNOSIS — I951 Orthostatic hypotension: Secondary | ICD-10-CM

## 2016-11-29 DIAGNOSIS — R0609 Other forms of dyspnea: Secondary | ICD-10-CM

## 2016-11-29 DIAGNOSIS — Z8679 Personal history of other diseases of the circulatory system: Secondary | ICD-10-CM

## 2016-11-29 DIAGNOSIS — I208 Other forms of angina pectoris: Secondary | ICD-10-CM | POA: Diagnosis not present

## 2016-11-29 DIAGNOSIS — E870 Hyperosmolality and hypernatremia: Secondary | ICD-10-CM

## 2016-11-29 DIAGNOSIS — I2583 Coronary atherosclerosis due to lipid rich plaque: Secondary | ICD-10-CM

## 2016-11-29 DIAGNOSIS — I2510 Atherosclerotic heart disease of native coronary artery without angina pectoris: Secondary | ICD-10-CM

## 2016-11-29 DIAGNOSIS — I10 Essential (primary) hypertension: Secondary | ICD-10-CM

## 2016-11-29 LAB — BASIC METABOLIC PANEL
BUN: 16 mg/dL (ref 7–25)
CHLORIDE: 106 mmol/L (ref 98–110)
CO2: 25 mmol/L (ref 20–31)
CREATININE: 1.22 mg/dL (ref 0.70–1.25)
Calcium: 8.8 mg/dL (ref 8.6–10.3)
Glucose, Bld: 91 mg/dL (ref 65–99)
Potassium: 4.8 mmol/L (ref 3.5–5.3)
Sodium: 138 mmol/L (ref 135–146)

## 2016-11-29 MED ORDER — LISINOPRIL 2.5 MG PO TABS: 2.5000 mg | ORAL_TABLET | Freq: Every day | ORAL | 30 refills | Status: DC

## 2016-11-29 NOTE — Patient Instructions (Addendum)
Medication Instructions:  Your physician has recommended you make the following change in your medication:  1-Decrease lisinopril 2.5 mg by mouth daily  Labwork: Your physician recommends that you have lab work today-BMET and CBC  Testing/Procedures: Your physician has requested that you have en exercise stress myoview. For further information please visit [email or web address]. Please follow instruction sheet, as given.  Follow-Up: Your physician wants you to follow-up in: 1 week with Cecilie Kicks NP after stress test.   If you need a refill on your cardiac medications before your next appointment, please call your pharmacy.

## 2016-11-29 NOTE — Telephone Encounter (Signed)
Patient given detailed instructions per Myocardial Perfusion Study Information Sheet for the test on 11/30/16 at 0945. Patient notified to arrive 15 minutes early and that it is imperative to arrive on time for appointment to keep from having the test rescheduled.  If you need to cancel or reschedule your appointment, please call the office within 24 hours of your appointment. Failure to do so may result in a cancellation of your appointment, and a $50 no show fee. Patient verbalized understanding.Christian [name] W    

## 2016-11-30 ENCOUNTER — Ambulatory Visit (HOSPITAL_COMMUNITY): Payer: Medicare Other | Attending: Cardiology

## 2016-11-30 DIAGNOSIS — Z8679 Personal history of other diseases of the circulatory system: Secondary | ICD-10-CM | POA: Diagnosis not present

## 2016-11-30 DIAGNOSIS — R0609 Other forms of dyspnea: Secondary | ICD-10-CM | POA: Insufficient documentation

## 2016-11-30 DIAGNOSIS — I208 Other forms of angina pectoris: Secondary | ICD-10-CM | POA: Insufficient documentation

## 2016-11-30 LAB — MYOCARDIAL PERFUSION IMAGING
CHL CUP MPHR: 151 {beats}/min
CHL CUP NUCLEAR SDS: 0
CSEPED: 3 min
CSEPHR: 92 %
CSEPPHR: 139 {beats}/min
Estimated workload: 5.2 METS
Exercise duration (sec): 30 s
LHR: 0.34
LV sys vol: 59 mL
LVDIAVOL: 111 mL (ref 62–150)
RPE: 18
Rest HR: 65 {beats}/min
SRS: 2
SSS: 2
TID: 0.98

## 2016-11-30 LAB — CBC WITH DIFFERENTIAL/PLATELET
BASOS ABS: 74 {cells}/uL (ref 0–200)
Basophils Relative: 1 %
EOS PCT: 4 %
Eosinophils Absolute: 296 cells/uL (ref 15–500)
HCT: 37.2 % — ABNORMAL LOW (ref 38.5–50.0)
Hemoglobin: 11.7 g/dL — ABNORMAL LOW (ref 13.2–17.1)
LYMPHS ABS: 1554 {cells}/uL (ref 850–3900)
Lymphocytes Relative: 21 %
MCH: 25.5 pg — ABNORMAL LOW (ref 27.0–33.0)
MCHC: 31.5 g/dL — AB (ref 32.0–36.0)
MCV: 81.2 fL (ref 80.0–100.0)
MONO ABS: 444 {cells}/uL (ref 200–950)
MPV: 11.6 fL (ref 7.5–12.5)
Monocytes Relative: 6 %
NEUTROS ABS: 5032 {cells}/uL (ref 1500–7800)
Neutrophils Relative %: 68 %
PLATELETS: 267 10*3/uL (ref 140–400)
RBC: 4.58 MIL/uL (ref 4.20–5.80)
RDW: 17.4 % — AB (ref 11.0–15.0)
WBC: 7.4 10*3/uL (ref 3.8–10.8)

## 2016-11-30 MED ORDER — TECHNETIUM TC 99M TETROFOSMIN IV KIT
10.9000 | PACK | Freq: Once | INTRAVENOUS | Status: AC | PRN
  Administered 2016-11-30: 10.9 via INTRAVENOUS
  Filled 2016-11-30: qty 11

## 2016-11-30 MED ORDER — TECHNETIUM TC 99M TETROFOSMIN IV KIT
32.5000 | PACK | Freq: Once | INTRAVENOUS | Status: AC | PRN
  Administered 2016-11-30: 32.5 via INTRAVENOUS
  Filled 2016-11-30: qty 33

## 2016-12-05 ENCOUNTER — Other Ambulatory Visit: Payer: Self-pay | Admitting: Physician Assistant

## 2016-12-05 ENCOUNTER — Encounter: Payer: Self-pay | Admitting: Physician Assistant

## 2016-12-05 ENCOUNTER — Encounter: Payer: Self-pay | Admitting: *Deleted

## 2016-12-05 ENCOUNTER — Ambulatory Visit (INDEPENDENT_AMBULATORY_CARE_PROVIDER_SITE_OTHER): Payer: Medicare Other | Admitting: Physician Assistant

## 2016-12-05 VITALS — BP 120/84 | HR 74 | Ht 71.0 in | Wt 273.1 lb

## 2016-12-05 DIAGNOSIS — I208 Other forms of angina pectoris: Secondary | ICD-10-CM

## 2016-12-05 DIAGNOSIS — E6609 Other obesity due to excess calories: Secondary | ICD-10-CM

## 2016-12-05 DIAGNOSIS — R0602 Shortness of breath: Secondary | ICD-10-CM

## 2016-12-05 DIAGNOSIS — I2510 Atherosclerotic heart disease of native coronary artery without angina pectoris: Secondary | ICD-10-CM

## 2016-12-05 DIAGNOSIS — Z6838 Body mass index (BMI) 38.0-38.9, adult: Secondary | ICD-10-CM

## 2016-12-05 DIAGNOSIS — I10 Essential (primary) hypertension: Secondary | ICD-10-CM

## 2016-12-05 DIAGNOSIS — E7800 Pure hypercholesterolemia, unspecified: Secondary | ICD-10-CM

## 2016-12-05 DIAGNOSIS — I2583 Coronary atherosclerosis due to lipid rich plaque: Secondary | ICD-10-CM

## 2016-12-05 DIAGNOSIS — IMO0001 Reserved for inherently not codable concepts without codable children: Secondary | ICD-10-CM

## 2016-12-05 LAB — PROTIME-INR
INR: 1
Prothrombin Time: 10.6 s (ref 9.0–11.5)

## 2016-12-05 MED ORDER — NITROGLYCERIN 0.4 MG SL SUBL: 0.4000 mg | SUBLINGUAL_TABLET | SUBLINGUAL | 3 refills | Status: DC | PRN

## 2016-12-05 NOTE — Addendum Note (Signed)
Addended by: Gaetano Net on: 12/05/2016 12:53 PM   Modules accepted: Orders

## 2016-12-05 NOTE — Progress Notes (Signed)
Cardiology Office Note    Date:  12/05/2016   ID:  Christian [name], DOB 1946-10-10, MRN [id number]  PCP:  Donnie Coffin, MD  Cardiologist: [name]  Chief Complaint  Patient presents with  . Shortness of Breath    History of Present Illness:  Christian [name] is a 70 y.o. male with history of CAD with 80% D1 too small for PCI, normal EF on cath in 2012, hypertension and dyslipidemia. The patient saw Garen Lah, NP 11/29/16 complaining of worsening dyspnea on exertion required 10 minutes or so to recover. He was also having some chest pressure. Blood pressure also up and down.  Nuclear stress test was ordered and performed on 11/30/16. No ST or segment deviation, no ischemia or evidence of infarction, mildly decreased LVEF 47%.  Patient is here today for results this test. He says he can only walk about a quarter mile daily and then when trying to walk up the incline at his home he gets out of breath. He said it has progressively worsened since May. He tried to start riding a bike and his tolerance his decreased. He is not sure whether he gets chest pain or not.      Past Medical History:  Diagnosis Date  . Arthritis    "joints; shoulders; left elbow; right thumb" (05/06/2014)  . Cataracts, bilateral    immature  . Constipation    takes Colace and Metamucil daily  . Coronary artery disease 3/12   80% stenosis of diagonal too small for PCI  . Diastolic dysfunction   . Heart murmur   . Hemorrhoids   . History of bronchitis   . Hypercholesterolemia    takes Crestor daily  . Hypertension    takes Lisinopril and Imdur daily  . Joint pain   . Nocturia   . Orthostatic hypotension   . Sciatica     Past Surgical History:  Procedure Laterality Date  . ACHILLES TENDON REPAIR Left    "& fixed a spur"  . CARDIAC CATHETERIZATION  2012  . COLONOSCOPY    . FLEXIBLE SIGMOIDOSCOPY  X 2  . HAND SURGERY Left    "thumb; cleaned out arthritis"  . HEEL SPUR EXCISION Left   .  KNEE ARTHROSCOPY Right   . SHOULDER SURGERY Right X 2   "cleaned out spurs"  . TOE FUSION Right    great toe; plates and screws  . TONSILLECTOMY  ~ 1950  . TOTAL HIP ARTHROPLASTY Right 09/22/2015   Procedure: TOTAL HIP ARTHROPLASTY;  Surgeon: Garald Balding, MD;  Location: [facility];  Service: Orthopedics;  Laterality: Right;  . TOTAL KNEE ARTHROPLASTY Right 05/06/2014  . TOTAL KNEE ARTHROPLASTY Right 05/06/2014   Procedure: RIGHT TOTAL KNEE ARTHROPLASTY;  Surgeon: Garald Balding, MD;  Location: [facility];  Service: Orthopedics;  Laterality: Right;    Current Medications: Outpatient Medications Prior to Visit  Medication Sig Dispense Refill  . aspirin 81 MG tablet Take 81 mg by mouth daily.    . diclofenac (VOLTAREN) 50 MG EC tablet Take 50 mg by mouth as directed.    . docusate sodium (STOOL SOFTENER) 100 MG capsule Take 200 mg by mouth daily.    [name] Kitchen doxycycline (VIBRAMYCIN) 100 MG capsule Take 100 mg by mouth continuous as needed (MUSCLE FLARE UPS).     . isosorbide mononitrate (IMDUR) 30 MG 24 hr tablet TAKE 1 TABLET BY MOUTH EVERY DAY 90 tablet 2  . lisinopril (PRINIVIL,ZESTRIL) 2.5 MG tablet Take 1  tablet (2.5 mg total) by mouth daily. 90 tablet 30  . psyllium (METAMUCIL SMOOTH TEXTURE) 28 % packet Take 1 packet by mouth 2 (two) times daily.    . rosuvastatin (CRESTOR) 10 MG tablet TAKE 1 TABLET(10 MG) BY MOUTH DAILY 90 tablet 0  . rosuvastatin (CRESTOR) 10 MG tablet TAKE 1 TABLET(10 MG) BY MOUTH DAILY 90 tablet 2  . FLUZONE HIGH-DOSE 0.5 ML SUSY      No facility-administered medications prior to visit.      Allergies:   Atorvastatin calcium [atorvastatin]; Simvastatin; and Pravastatin   Social History   Social History  . Marital status: Married    Spouse name: N/A  . Number of children: N/A  . Years of education: N/A   Social History Main Topics  . Smoking status: Former Smoker    Packs/day: 2.00    Years: 10.00    Types: Cigarettes  . Smokeless tobacco: Never Used      Comment: quit smoking in 1981  . Alcohol use Yes     Comment: "drink a beer sometimes once/month"  . Drug use: No  . Sexual activity: No   Other Topics Concern  . None   Social History Narrative  . None     Family History:  The patient's family history includes CVA in his father and mother; Heart disease in his father.   ROS:   Please see the history of present illness.    Review of Systems  Constitution: Negative.  HENT: Negative.   Cardiovascular: Positive for dyspnea on exertion.  Respiratory: Negative.   Endocrine: Negative.   Hematologic/Lymphatic: Negative.   Musculoskeletal: Negative.   Gastrointestinal: Negative.   Genitourinary: Negative.   Neurological: Negative.    All other systems reviewed and are negative.   PHYSICAL EXAM:   VS:  BP 120/84   Pulse 74   Ht 5\' 11"  (1.803 m)   Wt 273 lb 1.9 oz (123.9 kg)   BMI 38.09 kg/m   Physical Exam  GEN: Obese, in no acute distress  HEENT: normal  Neck: no JVD, carotid bruits, or masses Cardiac:RRR; no murmurs, rubs, or gallops  Respiratory:  clear to auscultation bilaterally, normal work of breathing GI: soft, nontender, nondistended, + BS Ext: without cyanosis, clubbing, or edema, Good distal pulses bilaterally MS: no deformity or atrophy  Skin: warm and dry, no rash Neuro:  Alert and Oriented x 3, Strength and sensation are intact Psych: euthymic mood, full affect  Wt Readings from Last 3 Encounters:  12/05/16 273 lb 1.9 oz (123.9 kg)  11/30/16 270 lb (122.5 kg)  11/29/16 270 lb 1.9 oz (122.5 kg)      Studies/Labs Reviewed:   EKG:  EKG is not ordered today.  Recent Labs: 04/27/2016: ALT 7 11/29/2016: BUN 16; Creat 1.22; Hemoglobin 11.7; Platelets 267; Potassium 4.8; Sodium 138   Lipid Panel    Component Value Date/Time   CHOL 133 04/27/2016 0826   TRIG 69 04/27/2016 0826   HDL 46 04/27/2016 0826   CHOLHDL 2.9 04/27/2016 0826   VLDL 14 04/27/2016 0826   LDLCALC 73 04/27/2016 0826    Additional  studies/ records that were reviewed today include:  RESULTS: 1. Left main coronary is widely patent and bifurcates left anterior     descending artery and left circumflex artery. 2. Left anterior descending artery is widely patent throughout its     course with a 20-30% eccentric lesion in the midportion of the LAD.     The LAD gives  rise to two diagonal branches.  Both diagonals are     very small.  The first diagonal has an 80% mid stenosis.  The     second diagonal is patent, but very small.   The left circumflex is widely patent throughout its course.  It gives rise to a first obtuse marginal branch which was small-to-moderate size and patent.  Just after the takeoff of the first obtuse marginal, there was a 20-30% eccentric lesion in the left circumflex.  The ongoing circumflex gives rise to a second obtuse marginal branch which is a large vessel and widely patent.   The right coronary is widely patent throughout its course distally and bifurcates into posterior descending artery and posterior lateral artery.  There is an eccentric lesion of 20-30% in the proximal RCA. 1. Left ventriculography shows normal LV function, EF 60%, LV pressure     109/4 mmHg aortic pressure 108/70 mmHg, LVEDP 10 mmHg.   ASSESSMENT: 1. 80% small diagonal one too small for PCI. 2. Nonobstructive disease in left circumflex RCA and LAD. 3. Normal LV function. 4. Normal LVEDP.  Nuclear stress test 11/30/16  Study Highlights   There was no ST segment deviation noted during stress.  This is a low risk study. There is no ischemia or evidence of infarction.  The left ventricular ejection fraction is mildly decreased (45-54%). Nuclear stress EF: 47%.  Nuclear stress EF: 47%.        ASSESSMENT:    1. SOB (shortness of breath)   2. Coronary artery disease due to lipid rich plaque   3. Essential hypertension   4. Hypercholesterolemia   5. Class 2 obesity due to excess calories with serious  comorbidity and body mass index (BMI) of 38.0 to 38.9 in adult      PLAN:  In order of problems listed above:  Shortness of breath/dyspnea on exertion that has progressively worsened over the past 6 months. Exercise Myoview showed no evidence of ischemia but decreased LVEF 47%. [name] reviewed this am recommending cardiac catheterization. Patient is agreeable. I have reviewed the risks, indications, and alternatives to angioplasty and stenting with the patient. Risks include but are not limited to bleeding, infection, vascular injury, stroke, myocardial infection, arrhythmia, kidney injury, radiation-related injury in the case of prolonged fluoroscopy use, emergency cardiac surgery, and death. The patient understands the risks of serious complication is low (123456) and he agrees to proceed.   CAD was history of 80% diagonal 1 non-amenable to PCI in 2012. Now with progressive dyspnea on exertion for cardiac catheterization  Essential hypertension patient's blood pressure has been labile but was very low in the past so lisinopril was decreased to 2.5 mg daily. Blood pressure is good today.  Hypercholesterolemia on Crestor  Obesity weight loss recommended.    Medication Adjustments/Labs and Tests Ordered: Current medicines are reviewed at length with the patient today.  Concerns regarding medicines are outlined above.  Medication changes, Labs and Tests ordered today are listed in the Patient Instructions below. Patient Instructions  Medication Instructions:  Your physician recommends that you continue on your current medications as directed. Please refer to the Current Medication list given to you today.   Labwork: TODAY:  PT/INR  Testing/Procedures: Your physician has requested that you have a cardiac catheterization. Cardiac catheterization is used to diagnose and/or treat various heart conditions. Doctors may recommend this procedure for a number of different reasons. The most  common reason is to evaluate chest pain. Chest pain can be  a symptom of coronary artery disease (CAD), and cardiac catheterization can show whether plaque is narrowing or blocking your heart's arteries. This procedure is also used to evaluate the valves, as well as measure the blood flow and oxygen levels in different parts of your heart. For further information please visit [email or web address]. Please follow instruction sheet, as given.    Your physician has requested that you have an echocardiogram. Echocardiography is a painless test that uses sound waves to create images of your heart. It provides your doctor with information about the size and shape of your heart and how well your heart's chambers and valves are working. This procedure takes approximately one hour. There are no restrictions for this procedure.    Follow-Up: Your physician recommends that you schedule a follow-up appointment in: DR. TURNER OR APP ON HER TEAM IN 2 WEEKS  Your physician recommends that you schedule a follow-up appointment in: [place]    Any Other Special Instructions Will Be Listed Below (If Applicable).  Coronary Angiogram With Stent Coronary angiogram with stent placement is a procedure to widen or open a narrow blood vessel of the heart (coronary artery). Arteries may become blocked by cholesterol buildup (plaques) in the lining or wall. When a coronary artery becomes partially blocked, blood flow to that area decreases. This may lead to chest pain or a heart attack (myocardial infarction). A stent is a small piece of metal that looks like mesh or a spring. Stent placement may be done as treatment for a heart attack or right after a coronary angiogram in which a blocked artery is found. Let your health care provider know about:  Any allergies you have.  All medicines you are taking, including vitamins, herbs, eye drops, creams, and over-the-counter medicines.  Any problems you or  family members have had with anesthetic medicines.  Any blood disorders you have.  Any surgeries you have had.  Any medical conditions you have.  Whether you are pregnant or may be pregnant. What are the risks? Generally, this is a safe procedure. However, problems may occur, including:  Damage to the heart or its blood vessels.  A return of blockage.  Bleeding, infection, or bruising at the insertion site.  A collection of blood under the skin (hematoma) at the insertion site.  A blood clot in another part of the body.  Kidney injury.  Allergic reaction to the dye or contrast that is used.  Bleeding into the abdomen (retroperitoneal bleeding). What happens before the procedure? Staying hydrated  Follow instructions from your health care provider about hydration, which may include:  Up to 2 hours before the procedure - you may continue to drink clear liquids, such as water, clear fruit juice, black coffee, and plain tea. Eating and drinking restrictions  Follow instructions from your health care provider about eating and drinking, which may include:  8 hours before the procedure - stop eating heavy meals or foods such as meat, fried foods, or fatty foods.  6 hours before the procedure - stop eating light meals or foods, such as toast or cereal.  2 hours before the procedure - stop drinking clear liquids. Ask your health care provider about:  Changing or stopping your regular medicines. This is especially important if you are taking diabetes medicines or blood thinners.  Taking medicines such as ibuprofen. These medicines can thin your blood. Do not take these medicines before your procedure if your health care provider instructs you not to. Generally, aspirin  is recommended before a procedure of passing a small, thin tube (catheter) through a blood vessel and into the heart (cardiac catheterization). What happens during the procedure?  An IV tube will be inserted into  one of your veins.  You will be given one or more of the following:  A medicine to help you relax (sedative).  A medicine to numb the area where the catheter will be inserted into an artery (local anesthetic).  To reduce your risk of infection:  Your health care team will wash or sanitize their hands.  Your skin will be washed with soap.  Hair may be removed from the area where the catheter will be inserted.  Using a guide wire, the catheter will be inserted into an artery. The location may be in your groin, in your wrist, or in the fold of your arm (near your elbow).  A type of X-ray (fluoroscopy) will be used to help guide the catheter to the opening of the arteries in the heart.  A dye will be injected into the catheter, and X-rays will be taken. The dye will help to show where any narrowing or blockages are located in the arteries.  A tiny wire will be guided to the blocked spot, and a balloon will be inflated to make the artery wider.  The stent will be expanded and will crush the plaques into the wall of the vessel. The stent will hold the area open and improve the blood flow. Most stents have a drug coating to reduce the risk of the stent narrowing over time.  The artery may be made wider using a drill, laser, or other tools to remove plaques.  When the blood flow is better, the catheter will be removed. The lining of the artery will grow over the stent, which stays where it was placed. This procedure may vary among health care providers and hospitals. What happens after the procedure?  If the procedure is done through the leg, you will be kept in bed lying flat for about 6 hours. You will be instructed to not bend and not cross your legs.  The insertion site will be checked frequently.  The pulse in your foot or wrist will be checked frequently.  You may have additional blood tests, X-rays, and a test that records the electrical activity of your heart (electrocardiogram,  or ECG). This information is not intended to replace advice given to you by your health care provider. Make sure you discuss any questions you have with your health care provider. Document Released: 06/18/2003 Document Revised: 08/11/2016 Document Reviewed: 07/17/2016 Elsevier Interactive Patient Education  2017 [employer].     If you need a refill on your cardiac medications before your next appointment, please call your pharmacy.      Sumner Boast, PA-C  12/05/2016 12:41 PM    [facility] [address], [place], [place]  [address] Phone: [phone]; Fax: [phone]

## 2016-12-05 NOTE — Patient Instructions (Addendum)
Medication Instructions:  Your physician recommends that you continue on your current medications as directed. Please refer to the Current Medication list given to you today.   Labwork: TODAY:  PT/INR  Testing/Procedures: Your physician has requested that you have a cardiac catheterization. Cardiac catheterization is used to diagnose and/or treat various heart conditions. Doctors may recommend this procedure for a number of different reasons. The most common reason is to evaluate chest pain. Chest pain can be a symptom of coronary artery disease (CAD), and cardiac catheterization can show whether plaque is narrowing or blocking your heart's arteries. This procedure is also used to evaluate the valves, as well as measure the blood flow and oxygen levels in different parts of your heart. For further information please visit [email or web address]. Please follow instruction sheet, as given.    Your physician has requested that you have an echocardiogram. Echocardiography is a painless test that uses sound waves to create images of your heart. It provides your doctor with information about the size and shape of your heart and how well your heart's chambers and valves are working. This procedure takes approximately one hour. There are no restrictions for this procedure.    Follow-Up: Your physician recommends that you schedule a follow-up appointment in: DR. TURNER OR APP ON HER TEAM IN 2 WEEKS  Your physician recommends that you schedule a follow-up appointment in: [place]    Any Other Special Instructions Will Be Listed Below (If Applicable).  Coronary Angiogram With Stent Coronary angiogram with stent placement is a procedure to widen or open a narrow blood vessel of the heart (coronary artery). Arteries may become blocked by cholesterol buildup (plaques) in the lining or wall. When a coronary artery becomes partially blocked, blood flow to that area decreases. This may lead  to chest pain or a heart attack (myocardial infarction). A stent is a small piece of metal that looks like mesh or a spring. Stent placement may be done as treatment for a heart attack or right after a coronary angiogram in which a blocked artery is found. Let your health care provider know about:  Any allergies you have.  All medicines you are taking, including vitamins, herbs, eye drops, creams, and over-the-counter medicines.  Any problems you or family members have had with anesthetic medicines.  Any blood disorders you have.  Any surgeries you have had.  Any medical conditions you have.  Whether you are pregnant or may be pregnant. What are the risks? Generally, this is a safe procedure. However, problems may occur, including:  Damage to the heart or its blood vessels.  A return of blockage.  Bleeding, infection, or bruising at the insertion site.  A collection of blood under the skin (hematoma) at the insertion site.  A blood clot in another part of the body.  Kidney injury.  Allergic reaction to the dye or contrast that is used.  Bleeding into the abdomen (retroperitoneal bleeding). What happens before the procedure? Staying hydrated  Follow instructions from your health care provider about hydration, which may include:  Up to 2 hours before the procedure - you may continue to drink clear liquids, such as water, clear fruit juice, black coffee, and plain tea. Eating and drinking restrictions  Follow instructions from your health care provider about eating and drinking, which may include:  8 hours before the procedure - stop eating heavy meals or foods such as meat, fried foods, or fatty foods.  6 hours before the procedure -  stop eating light meals or foods, such as toast or cereal.  2 hours before the procedure - stop drinking clear liquids. Ask your health care provider about:  Changing or stopping your regular medicines. This is especially important if you  are taking diabetes medicines or blood thinners.  Taking medicines such as ibuprofen. These medicines can thin your blood. Do not take these medicines before your procedure if your health care provider instructs you not to. Generally, aspirin is recommended before a procedure of passing a small, thin tube (catheter) through a blood vessel and into the heart (cardiac catheterization). What happens during the procedure?  An IV tube will be inserted into one of your veins.  You will be given one or more of the following:  A medicine to help you relax (sedative).  A medicine to numb the area where the catheter will be inserted into an artery (local anesthetic).  To reduce your risk of infection:  Your health care team will wash or sanitize their hands.  Your skin will be washed with soap.  Hair may be removed from the area where the catheter will be inserted.  Using a guide wire, the catheter will be inserted into an artery. The location may be in your groin, in your wrist, or in the fold of your arm (near your elbow).  A type of X-ray (fluoroscopy) will be used to help guide the catheter to the opening of the arteries in the heart.  A dye will be injected into the catheter, and X-rays will be taken. The dye will help to show where any narrowing or blockages are located in the arteries.  A tiny wire will be guided to the blocked spot, and a balloon will be inflated to make the artery wider.  The stent will be expanded and will crush the plaques into the wall of the vessel. The stent will hold the area open and improve the blood flow. Most stents have a drug coating to reduce the risk of the stent narrowing over time.  The artery may be made wider using a drill, laser, or other tools to remove plaques.  When the blood flow is better, the catheter will be removed. The lining of the artery will grow over the stent, which stays where it was placed. This procedure may vary among health care  providers and hospitals. What happens after the procedure?  If the procedure is done through the leg, you will be kept in bed lying flat for about 6 hours. You will be instructed to not bend and not cross your legs.  The insertion site will be checked frequently.  The pulse in your foot or wrist will be checked frequently.  You may have additional blood tests, X-rays, and a test that records the electrical activity of your heart (electrocardiogram, or ECG). This information is not intended to replace advice given to you by your health care provider. Make sure you discuss any questions you have with your health care provider. Document Released: 06/18/2003 Document Revised: 08/11/2016 Document Reviewed: 07/17/2016 Elsevier Interactive Patient Education  2017 [employer].   Echocardiogram An echocardiogram, or echocardiography, uses sound waves (ultrasound) to produce an image of your heart. The echocardiogram is simple, painless, obtained within a short period of time, and offers valuable information to your health care provider. The images from an echocardiogram can provide information such as:  Evidence of coronary artery disease (CAD).  Heart size.  Heart muscle function.  Heart valve function.  Aneurysm  detection.  Evidence of a past heart attack.  Fluid buildup around the heart.  Heart muscle thickening.  Assess heart valve function. Tell a health care provider about:  Any allergies you have.  All medicines you are taking, including vitamins, herbs, eye drops, creams, and over-the-counter medicines.  Any problems you or family members have had with anesthetic medicines.  Any blood disorders you have.  Any surgeries you have had.  Any medical conditions you have.  Whether you are pregnant or may be pregnant. What happens before the procedure? No special preparation is needed. Eat and drink normally. What happens during the procedure?  In order to produce an  image of your heart, gel will be applied to your chest and a wand-like tool (transducer) will be moved over your chest. The gel will help transmit the sound waves from the transducer. The sound waves will harmlessly bounce off your heart to allow the heart images to be captured in real-time motion. These images will then be recorded.  You may need an IV to receive a medicine that improves the quality of the pictures. What happens after the procedure? You may return to your normal schedule including diet, activities, and medicines, unless your health care provider tells you otherwise. This information is not intended to replace advice given to you by your health care provider. Make sure you discuss any questions you have with your health care provider. Document Released: 12/09/2000 Document Revised: 07/30/2016 Document Reviewed: 08/19/2013 Elsevier Interactive Patient Education  2017 [employer].     If you need a refill on your cardiac medications before your next appointment, please call your pharmacy.

## 2016-12-06 ENCOUNTER — Encounter (HOSPITAL_COMMUNITY)
Admission: RE | Disposition: A | Discharge status: Home / Self Care | Payer: Self-pay | Source: Ambulatory Visit | Attending: Cardiovascular Disease

## 2016-12-06 ENCOUNTER — Ambulatory Visit (HOSPITAL_COMMUNITY)
Admission: RE | Admit: 2016-12-06 | Discharge: 2016-12-06 | Disposition: A | Discharge status: Home / Self Care | Payer: Medicare Other | Source: Ambulatory Visit | Attending: Cardiovascular Disease | Admitting: Cardiovascular Disease

## 2016-12-06 DIAGNOSIS — E7800 Pure hypercholesterolemia, unspecified: Secondary | ICD-10-CM | POA: Insufficient documentation

## 2016-12-06 DIAGNOSIS — I10 Essential (primary) hypertension: Secondary | ICD-10-CM | POA: Diagnosis present

## 2016-12-06 DIAGNOSIS — Z8249 Family history of ischemic heart disease and other diseases of the circulatory system: Secondary | ICD-10-CM | POA: Insufficient documentation

## 2016-12-06 DIAGNOSIS — Z823 Family history of stroke: Secondary | ICD-10-CM | POA: Diagnosis not present

## 2016-12-06 DIAGNOSIS — Z87891 Personal history of nicotine dependence: Secondary | ICD-10-CM | POA: Insufficient documentation

## 2016-12-06 DIAGNOSIS — Z7982 Long term (current) use of aspirin: Secondary | ICD-10-CM | POA: Insufficient documentation

## 2016-12-06 DIAGNOSIS — M19022 Primary osteoarthritis, left elbow: Secondary | ICD-10-CM | POA: Insufficient documentation

## 2016-12-06 DIAGNOSIS — I2583 Coronary atherosclerosis due to lipid rich plaque: Secondary | ICD-10-CM | POA: Insufficient documentation

## 2016-12-06 DIAGNOSIS — Z791 Long term (current) use of non-steroidal anti-inflammatories (NSAID): Secondary | ICD-10-CM | POA: Insufficient documentation

## 2016-12-06 DIAGNOSIS — I5189 Other ill-defined heart diseases: Secondary | ICD-10-CM | POA: Diagnosis present

## 2016-12-06 DIAGNOSIS — M19011 Primary osteoarthritis, right shoulder: Secondary | ICD-10-CM | POA: Insufficient documentation

## 2016-12-06 DIAGNOSIS — Z96651 Presence of right artificial knee joint: Secondary | ICD-10-CM | POA: Insufficient documentation

## 2016-12-06 DIAGNOSIS — R0602 Shortness of breath: Secondary | ICD-10-CM

## 2016-12-06 DIAGNOSIS — E669 Obesity, unspecified: Secondary | ICD-10-CM | POA: Diagnosis not present

## 2016-12-06 DIAGNOSIS — Z9889 Other specified postprocedural states: Secondary | ICD-10-CM | POA: Diagnosis not present

## 2016-12-06 DIAGNOSIS — M19012 Primary osteoarthritis, left shoulder: Secondary | ICD-10-CM | POA: Insufficient documentation

## 2016-12-06 DIAGNOSIS — I2510 Atherosclerotic heart disease of native coronary artery without angina pectoris: Secondary | ICD-10-CM | POA: Diagnosis not present

## 2016-12-06 DIAGNOSIS — M19041 Primary osteoarthritis, right hand: Secondary | ICD-10-CM | POA: Insufficient documentation

## 2016-12-06 DIAGNOSIS — Z79899 Other long term (current) drug therapy: Secondary | ICD-10-CM | POA: Insufficient documentation

## 2016-12-06 DIAGNOSIS — Z96641 Presence of right artificial hip joint: Secondary | ICD-10-CM | POA: Insufficient documentation

## 2016-12-06 DIAGNOSIS — Z6838 Body mass index (BMI) 38.0-38.9, adult: Secondary | ICD-10-CM | POA: Diagnosis not present

## 2016-12-06 DIAGNOSIS — K5900 Constipation, unspecified: Secondary | ICD-10-CM | POA: Insufficient documentation

## 2016-12-06 DIAGNOSIS — R0600 Dyspnea, unspecified: Secondary | ICD-10-CM

## 2016-12-06 DIAGNOSIS — R0609 Other forms of dyspnea: Secondary | ICD-10-CM | POA: Diagnosis present

## 2016-12-06 DIAGNOSIS — H269 Unspecified cataract: Secondary | ICD-10-CM | POA: Insufficient documentation

## 2016-12-06 HISTORY — PX: CARDIAC CATHETERIZATION: SHX172

## 2016-12-06 SURGERY — LEFT HEART CATH AND CORONARY ANGIOGRAPHY
Anesthesia: LOCAL

## 2016-12-06 MED ORDER — HEPARIN (PORCINE) IN NACL 2-0.9 UNIT/ML-% IJ SOLN
INTRAMUSCULAR | Status: DC | PRN
  Administered 2016-12-06: 1000 mL

## 2016-12-06 MED ORDER — MIDAZOLAM HCL 2 MG/2ML IJ SOLN
INTRAMUSCULAR | Status: DC | PRN
  Administered 2016-12-06 (×2): 1 mg via INTRAVENOUS

## 2016-12-06 MED ORDER — SODIUM CHLORIDE 0.9 % WEIGHT BASED INFUSION: 1.0000 mL/kg/h | INTRAVENOUS | Status: DC

## 2016-12-06 MED ORDER — HEPARIN (PORCINE) IN NACL 2-0.9 UNIT/ML-% IJ SOLN
INTRAMUSCULAR | Status: AC
  Filled 2016-12-06: qty 1000

## 2016-12-06 MED ORDER — SODIUM CHLORIDE 0.9 % IV SOLN: 250.0000 mL | INTRAVENOUS | Status: DC | PRN

## 2016-12-06 MED ORDER — SODIUM CHLORIDE 0.9% FLUSH: 3.0000 mL | Freq: Two times a day (BID) | INTRAVENOUS | Status: DC

## 2016-12-06 MED ORDER — LIDOCAINE HCL (PF) 1 % IJ SOLN
INTRAMUSCULAR | Status: AC
  Filled 2016-12-06: qty 30

## 2016-12-06 MED ORDER — SODIUM CHLORIDE 0.9% FLUSH: 3.0000 mL | INTRAVENOUS | Status: DC | PRN

## 2016-12-06 MED ORDER — LIDOCAINE HCL (PF) 1 % IJ SOLN
INTRAMUSCULAR | Status: DC | PRN
  Administered 2016-12-06: 2 mL via INTRADERMAL

## 2016-12-06 MED ORDER — FENTANYL CITRATE (PF) 100 MCG/2ML IJ SOLN
INTRAMUSCULAR | Status: AC
  Filled 2016-12-06: qty 2

## 2016-12-06 MED ORDER — HEPARIN SODIUM (PORCINE) 1000 UNIT/ML IJ SOLN
INTRAMUSCULAR | Status: AC
  Filled 2016-12-06: qty 1

## 2016-12-06 MED ORDER — VERAPAMIL HCL 2.5 MG/ML IV SOLN
INTRAVENOUS | Status: DC | PRN
  Administered 2016-12-06: 10 mL via INTRA_ARTERIAL

## 2016-12-06 MED ORDER — HEPARIN SODIUM (PORCINE) 1000 UNIT/ML IJ SOLN
INTRAMUSCULAR | Status: DC | PRN
  Administered 2016-12-06: 6000 [IU] via INTRAVENOUS

## 2016-12-06 MED ORDER — FENTANYL CITRATE (PF) 100 MCG/2ML IJ SOLN
INTRAMUSCULAR | Status: DC | PRN
  Administered 2016-12-06 (×2): 50 ug via INTRAVENOUS

## 2016-12-06 MED ORDER — IOPAMIDOL (ISOVUE-370) INJECTION 76%
INTRAVENOUS | Status: AC
  Filled 2016-12-06: qty 100

## 2016-12-06 MED ORDER — MIDAZOLAM HCL 2 MG/2ML IJ SOLN
INTRAMUSCULAR | Status: AC
  Filled 2016-12-06: qty 2

## 2016-12-06 MED ORDER — IOPAMIDOL (ISOVUE-370) INJECTION 76%
INTRAVENOUS | Status: DC | PRN
  Administered 2016-12-06: 75 mL via INTRA_ARTERIAL

## 2016-12-06 MED ORDER — ASPIRIN 81 MG PO CHEW: 81.0000 mg | CHEWABLE_TABLET | ORAL | Status: DC

## 2016-12-06 MED ORDER — SODIUM CHLORIDE 0.9 % WEIGHT BASED INFUSION
3.0000 mL/kg/h | INTRAVENOUS | Status: DC
  Administered 2016-12-06: 3 mL/kg/h via INTRAVENOUS

## 2016-12-06 MED ORDER — VERAPAMIL HCL 2.5 MG/ML IV SOLN
INTRAVENOUS | Status: AC
  Filled 2016-12-06: qty 2

## 2016-12-06 SURGICAL SUPPLY — 10 items
CATH EXPO 5F FL3.5 (CATHETERS) ×1 IMPLANT
CATH INFINITI JR4 5F (CATHETERS) ×1 IMPLANT
DEVICE RAD COMP TR BAND LRG (VASCULAR PRODUCTS) ×1 IMPLANT
GLIDESHEATH SLEND A-KIT 6F 22G (SHEATH) ×1 IMPLANT
GUIDEWIRE INQWIRE 1.5J.035X260 (WIRE) IMPLANT
INQWIRE 1.5J .035X260CM (WIRE) ×4
KIT HEART LEFT (KITS) ×2 IMPLANT
PACK CARDIAC CATHETERIZATION (CUSTOM PROCEDURE TRAY) ×2 IMPLANT
TRANSDUCER W/STOPCOCK (MISCELLANEOUS) ×2 IMPLANT
TUBING CIL FLEX 10 FLL-RA (TUBING) ×2 IMPLANT

## 2016-12-06 NOTE — Discharge Instructions (Signed)

## 2016-12-06 NOTE — CV Procedure (Signed)
   Mild to moderate LAD circumflex and RCA disease. No high-grade obstruction noted.  Elevated left ventricular end-diastolic pressure. Estimated EF 50%.  Frequent uniform PVCs noted during the procedure.  Consider PVC induced left ventricular dysfunction/cardiomyopathy

## 2016-12-06 NOTE — H&P (View-Only) (Signed)
Cardiology Office Note    Date:  12/05/2016   ID:  Christian [name], DOB 09-07-1946, MRN [id number]  PCP:  Donnie Coffin, MD  Cardiologist: [name]  Chief Complaint  Patient presents with  . Shortness of Breath    History of Present Illness:  Christian [name] is a 70 y.o. male with history of CAD with 80% D1 too small for PCI, normal EF on cath in 2012, hypertension and dyslipidemia. The patient saw Garen Lah, NP 11/29/16 complaining of worsening dyspnea on exertion required 10 minutes or so to recover. He was also having some chest pressure. Blood pressure also up and down.  Nuclear stress test was ordered and performed on 11/30/16. No ST or segment deviation, no ischemia or evidence of infarction, mildly decreased LVEF 47%.  Patient is here today for results this test. He says he can only walk about a quarter mile daily and then when trying to walk up the incline at his home he gets out of breath. He said it has progressively worsened since May. He tried to start riding a bike and his tolerance his decreased. He is not sure whether he gets chest pain or not.      Past Medical History:  Diagnosis Date  . Arthritis    "joints; shoulders; left elbow; right thumb" (05/06/2014)  . Cataracts, bilateral    immature  . Constipation    takes Colace and Metamucil daily  . Coronary artery disease 3/12   80% stenosis of diagonal too small for PCI  . Diastolic dysfunction   . Heart murmur   . Hemorrhoids   . History of bronchitis   . Hypercholesterolemia    takes Crestor daily  . Hypertension    takes Lisinopril and Imdur daily  . Joint pain   . Nocturia   . Orthostatic hypotension   . Sciatica     Past Surgical History:  Procedure Laterality Date  . ACHILLES TENDON REPAIR Left    "& fixed a spur"  . CARDIAC CATHETERIZATION  2012  . COLONOSCOPY    . FLEXIBLE SIGMOIDOSCOPY  X 2  . HAND SURGERY Left    "thumb; cleaned out arthritis"  . HEEL SPUR EXCISION Left   .  KNEE ARTHROSCOPY Right   . SHOULDER SURGERY Right X 2   "cleaned out spurs"  . TOE FUSION Right    great toe; plates and screws  . TONSILLECTOMY  ~ 1950  . TOTAL HIP ARTHROPLASTY Right 09/22/2015   Procedure: TOTAL HIP ARTHROPLASTY;  Surgeon: Garald Balding, MD;  Location: [facility];  Service: Orthopedics;  Laterality: Right;  . TOTAL KNEE ARTHROPLASTY Right 05/06/2014  . TOTAL KNEE ARTHROPLASTY Right 05/06/2014   Procedure: RIGHT TOTAL KNEE ARTHROPLASTY;  Surgeon: Garald Balding, MD;  Location: [facility];  Service: Orthopedics;  Laterality: Right;    Current Medications: Outpatient Medications Prior to Visit  Medication Sig Dispense Refill  . aspirin 81 MG tablet Take 81 mg by mouth daily.    . diclofenac (VOLTAREN) 50 MG EC tablet Take 50 mg by mouth as directed.    . docusate sodium (STOOL SOFTENER) 100 MG capsule Take 200 mg by mouth daily.    [name] Kitchen doxycycline (VIBRAMYCIN) 100 MG capsule Take 100 mg by mouth continuous as needed (MUSCLE FLARE UPS).     . isosorbide mononitrate (IMDUR) 30 MG 24 hr tablet TAKE 1 TABLET BY MOUTH EVERY DAY 90 tablet 2  . lisinopril (PRINIVIL,ZESTRIL) 2.5 MG tablet Take 1  tablet (2.5 mg total) by mouth daily. 90 tablet 30  . psyllium (METAMUCIL SMOOTH TEXTURE) 28 % packet Take 1 packet by mouth 2 (two) times daily.    . rosuvastatin (CRESTOR) 10 MG tablet TAKE 1 TABLET(10 MG) BY MOUTH DAILY 90 tablet 0  . rosuvastatin (CRESTOR) 10 MG tablet TAKE 1 TABLET(10 MG) BY MOUTH DAILY 90 tablet 2  . FLUZONE HIGH-DOSE 0.5 ML SUSY      No facility-administered medications prior to visit.      Allergies:   Atorvastatin calcium [atorvastatin]; Simvastatin; and Pravastatin   Social History   Social History  . Marital status: Married    Spouse name: N/A  . Number of children: N/A  . Years of education: N/A   Social History Main Topics  . Smoking status: Former Smoker    Packs/day: 2.00    Years: 10.00    Types: Cigarettes  . Smokeless tobacco: Never Used      Comment: quit smoking in 1981  . Alcohol use Yes     Comment: "drink a beer sometimes once/month"  . Drug use: No  . Sexual activity: No   Other Topics Concern  . None   Social History Narrative  . None     Family History:  The patient's family history includes CVA in his father and mother; Heart disease in his father.   ROS:   Please see the history of present illness.    Review of Systems  Constitution: Negative.  HENT: Negative.   Cardiovascular: Positive for dyspnea on exertion.  Respiratory: Negative.   Endocrine: Negative.   Hematologic/Lymphatic: Negative.   Musculoskeletal: Negative.   Gastrointestinal: Negative.   Genitourinary: Negative.   Neurological: Negative.    All other systems reviewed and are negative.   PHYSICAL EXAM:   VS:  BP 120/84   Pulse 74   Ht 5\' 11"  (1.803 m)   Wt 273 lb 1.9 oz (123.9 kg)   BMI 38.09 kg/m   Physical Exam  GEN: Obese, in no acute distress  HEENT: normal  Neck: no JVD, carotid bruits, or masses Cardiac:RRR; no murmurs, rubs, or gallops  Respiratory:  clear to auscultation bilaterally, normal work of breathing GI: soft, nontender, nondistended, + BS Ext: without cyanosis, clubbing, or edema, Good distal pulses bilaterally MS: no deformity or atrophy  Skin: warm and dry, no rash Neuro:  Alert and Oriented x 3, Strength and sensation are intact Psych: euthymic mood, full affect  Wt Readings from Last 3 Encounters:  12/05/16 273 lb 1.9 oz (123.9 kg)  11/30/16 270 lb (122.5 kg)  11/29/16 270 lb 1.9 oz (122.5 kg)      Studies/Labs Reviewed:   EKG:  EKG is not ordered today.  Recent Labs: 04/27/2016: ALT 7 11/29/2016: BUN 16; Creat 1.22; Hemoglobin 11.7; Platelets 267; Potassium 4.8; Sodium 138   Lipid Panel    Component Value Date/Time   CHOL 133 04/27/2016 0826   TRIG 69 04/27/2016 0826   HDL 46 04/27/2016 0826   CHOLHDL 2.9 04/27/2016 0826   VLDL 14 04/27/2016 0826   LDLCALC 73 04/27/2016 0826    Additional  studies/ records that were reviewed today include:  RESULTS: 1. Left main coronary is widely patent and bifurcates left anterior     descending artery and left circumflex artery. 2. Left anterior descending artery is widely patent throughout its     course with a 20-30% eccentric lesion in the midportion of the LAD.     The LAD gives  rise to two diagonal branches.  Both diagonals are     very small.  The first diagonal has an 80% mid stenosis.  The     second diagonal is patent, but very small.   The left circumflex is widely patent throughout its course.  It gives rise to a first obtuse marginal branch which was small-to-moderate size and patent.  Just after the takeoff of the first obtuse marginal, there was a 20-30% eccentric lesion in the left circumflex.  The ongoing circumflex gives rise to a second obtuse marginal branch which is a large vessel and widely patent.   The right coronary is widely patent throughout its course distally and bifurcates into posterior descending artery and posterior lateral artery.  There is an eccentric lesion of 20-30% in the proximal RCA. 1. Left ventriculography shows normal LV function, EF 60%, LV pressure     109/4 mmHg aortic pressure 108/70 mmHg, LVEDP 10 mmHg.   ASSESSMENT: 1. 80% small diagonal one too small for PCI. 2. Nonobstructive disease in left circumflex RCA and LAD. 3. Normal LV function. 4. Normal LVEDP.  Nuclear stress test 11/30/16  Study Highlights   There was no ST segment deviation noted during stress.  This is a low risk study. There is no ischemia or evidence of infarction.  The left ventricular ejection fraction is mildly decreased (45-54%). Nuclear stress EF: 47%.  Nuclear stress EF: 47%.        ASSESSMENT:    1. SOB (shortness of breath)   2. Coronary artery disease due to lipid rich plaque   3. Essential hypertension   4. Hypercholesterolemia   5. Class 2 obesity due to excess calories with serious  comorbidity and body mass index (BMI) of 38.0 to 38.9 in adult      PLAN:  In order of problems listed above:  Shortness of breath/dyspnea on exertion that has progressively worsened over the past 6 months. Exercise Myoview showed no evidence of ischemia but decreased LVEF 47%. [name] reviewed this am recommending cardiac catheterization. Patient is agreeable. I have reviewed the risks, indications, and alternatives to angioplasty and stenting with the patient. Risks include but are not limited to bleeding, infection, vascular injury, stroke, myocardial infection, arrhythmia, kidney injury, radiation-related injury in the case of prolonged fluoroscopy use, emergency cardiac surgery, and death. The patient understands the risks of serious complication is low (123456) and he agrees to proceed.   CAD was history of 80% diagonal 1 non-amenable to PCI in 2012. Now with progressive dyspnea on exertion for cardiac catheterization  Essential hypertension patient's blood pressure has been labile but was very low in the past so lisinopril was decreased to 2.5 mg daily. Blood pressure is good today.  Hypercholesterolemia on Crestor  Obesity weight loss recommended.    Medication Adjustments/Labs and Tests Ordered: Current medicines are reviewed at length with the patient today.  Concerns regarding medicines are outlined above.  Medication changes, Labs and Tests ordered today are listed in the Patient Instructions below. Patient Instructions  Medication Instructions:  Your physician recommends that you continue on your current medications as directed. Please refer to the Current Medication list given to you today.   Labwork: TODAY:  PT/INR  Testing/Procedures: Your physician has requested that you have a cardiac catheterization. Cardiac catheterization is used to diagnose and/or treat various heart conditions. Doctors may recommend this procedure for a number of different reasons. The most  common reason is to evaluate chest pain. Chest pain can be  a symptom of coronary artery disease (CAD), and cardiac catheterization can show whether plaque is narrowing or blocking your heart's arteries. This procedure is also used to evaluate the valves, as well as measure the blood flow and oxygen levels in different parts of your heart. For further information please visit [email or web address]. Please follow instruction sheet, as given.    Your physician has requested that you have an echocardiogram. Echocardiography is a painless test that uses sound waves to create images of your heart. It provides your doctor with information about the size and shape of your heart and how well your heart's chambers and valves are working. This procedure takes approximately one hour. There are no restrictions for this procedure.    Follow-Up: Your physician recommends that you schedule a follow-up appointment in: DR. TURNER OR APP ON HER TEAM IN 2 WEEKS  Your physician recommends that you schedule a follow-up appointment in: [place]    Any Other Special Instructions Will Be Listed Below (If Applicable).  Coronary Angiogram With Stent Coronary angiogram with stent placement is a procedure to widen or open a narrow blood vessel of the heart (coronary artery). Arteries may become blocked by cholesterol buildup (plaques) in the lining or wall. When a coronary artery becomes partially blocked, blood flow to that area decreases. This may lead to chest pain or a heart attack (myocardial infarction). A stent is a small piece of metal that looks like mesh or a spring. Stent placement may be done as treatment for a heart attack or right after a coronary angiogram in which a blocked artery is found. Let your health care provider know about:  Any allergies you have.  All medicines you are taking, including vitamins, herbs, eye drops, creams, and over-the-counter medicines.  Any problems you or  family members have had with anesthetic medicines.  Any blood disorders you have.  Any surgeries you have had.  Any medical conditions you have.  Whether you are pregnant or may be pregnant. What are the risks? Generally, this is a safe procedure. However, problems may occur, including:  Damage to the heart or its blood vessels.  A return of blockage.  Bleeding, infection, or bruising at the insertion site.  A collection of blood under the skin (hematoma) at the insertion site.  A blood clot in another part of the body.  Kidney injury.  Allergic reaction to the dye or contrast that is used.  Bleeding into the abdomen (retroperitoneal bleeding). What happens before the procedure? Staying hydrated  Follow instructions from your health care provider about hydration, which may include:  Up to 2 hours before the procedure - you may continue to drink clear liquids, such as water, clear fruit juice, black coffee, and plain tea. Eating and drinking restrictions  Follow instructions from your health care provider about eating and drinking, which may include:  8 hours before the procedure - stop eating heavy meals or foods such as meat, fried foods, or fatty foods.  6 hours before the procedure - stop eating light meals or foods, such as toast or cereal.  2 hours before the procedure - stop drinking clear liquids. Ask your health care provider about:  Changing or stopping your regular medicines. This is especially important if you are taking diabetes medicines or blood thinners.  Taking medicines such as ibuprofen. These medicines can thin your blood. Do not take these medicines before your procedure if your health care provider instructs you not to. Generally, aspirin  is recommended before a procedure of passing a small, thin tube (catheter) through a blood vessel and into the heart (cardiac catheterization). What happens during the procedure?  An IV tube will be inserted into  one of your veins.  You will be given one or more of the following:  A medicine to help you relax (sedative).  A medicine to numb the area where the catheter will be inserted into an artery (local anesthetic).  To reduce your risk of infection:  Your health care team will wash or sanitize their hands.  Your skin will be washed with soap.  Hair may be removed from the area where the catheter will be inserted.  Using a guide wire, the catheter will be inserted into an artery. The location may be in your groin, in your wrist, or in the fold of your arm (near your elbow).  A type of X-ray (fluoroscopy) will be used to help guide the catheter to the opening of the arteries in the heart.  A dye will be injected into the catheter, and X-rays will be taken. The dye will help to show where any narrowing or blockages are located in the arteries.  A tiny wire will be guided to the blocked spot, and a balloon will be inflated to make the artery wider.  The stent will be expanded and will crush the plaques into the wall of the vessel. The stent will hold the area open and improve the blood flow. Most stents have a drug coating to reduce the risk of the stent narrowing over time.  The artery may be made wider using a drill, laser, or other tools to remove plaques.  When the blood flow is better, the catheter will be removed. The lining of the artery will grow over the stent, which stays where it was placed. This procedure may vary among health care providers and hospitals. What happens after the procedure?  If the procedure is done through the leg, you will be kept in bed lying flat for about 6 hours. You will be instructed to not bend and not cross your legs.  The insertion site will be checked frequently.  The pulse in your foot or wrist will be checked frequently.  You may have additional blood tests, X-rays, and a test that records the electrical activity of your heart (electrocardiogram,  or ECG). This information is not intended to replace advice given to you by your health care provider. Make sure you discuss any questions you have with your health care provider. Document Released: 06/18/2003 Document Revised: 08/11/2016 Document Reviewed: 07/17/2016 Elsevier Interactive Patient Education  2017 [employer].     If you need a refill on your cardiac medications before your next appointment, please call your pharmacy.      Sumner Boast, PA-C  12/05/2016 12:41 PM    [facility] [address], [place], [place]  [address] Phone: [phone]; Fax: [phone]

## 2016-12-06 NOTE — Interval H&P Note (Signed)
Cath Lab Visit (complete for each Cath Lab visit)  Clinical Evaluation Leading to the Procedure:   ACS: No.  Non-ACS:    Anginal Classification: CCS [name]  Anti-ischemic medical therapy: Minimal Therapy (1 class of medications)  Non-Invasive Test Results: Low-risk stress test findings: cardiac mortality <1%/year  Prior CABG: No previous CABG      History and Physical Interval Note:  12/06/2016 3:00 PM  [name]  has presented today for surgery, with the diagnosis of cad  The various methods of treatment have been discussed with the patient and family. After consideration of risks, benefits and other options for treatment, the patient has consented to  Procedure(s): Left Heart Cath and Coronary Angiography (N/A) as a surgical intervention .  The patient's history has been reviewed, patient examined, no change in status, stable for surgery.  I have reviewed the patient's chart and labs.  Questions were answered to the patient's satisfaction.     Christian [name]

## 2016-12-07 ENCOUNTER — Encounter (HOSPITAL_COMMUNITY): Payer: Self-pay | Admitting: Interventional Cardiology

## 2016-12-09 ENCOUNTER — Ambulatory Visit: Payer: Medicare Other | Admitting: Cardiology

## 2016-12-20 ENCOUNTER — Telehealth: Payer: Self-pay | Admitting: *Deleted

## 2016-12-20 ENCOUNTER — Ambulatory Visit (INDEPENDENT_AMBULATORY_CARE_PROVIDER_SITE_OTHER): Payer: Medicare Other | Admitting: Physician Assistant

## 2016-12-20 ENCOUNTER — Ambulatory Visit
Admission: RE | Admit: 2016-12-20 | Discharge: 2016-12-20 | Disposition: A | Discharge status: Home / Self Care | Payer: Medicare Other | Source: Ambulatory Visit | Attending: Physician Assistant | Admitting: Physician Assistant

## 2016-12-20 ENCOUNTER — Encounter: Payer: Self-pay | Admitting: Physician Assistant

## 2016-12-20 VITALS — BP 124/80 | HR 72 | Ht 71.0 in | Wt 276.8 lb

## 2016-12-20 DIAGNOSIS — J40 Bronchitis, not specified as acute or chronic: Secondary | ICD-10-CM

## 2016-12-20 DIAGNOSIS — I10 Essential (primary) hypertension: Secondary | ICD-10-CM

## 2016-12-20 DIAGNOSIS — R0602 Shortness of breath: Secondary | ICD-10-CM | POA: Diagnosis not present

## 2016-12-20 DIAGNOSIS — I493 Ventricular premature depolarization: Secondary | ICD-10-CM

## 2016-12-20 DIAGNOSIS — E7800 Pure hypercholesterolemia, unspecified: Secondary | ICD-10-CM

## 2016-12-20 DIAGNOSIS — I2510 Atherosclerotic heart disease of native coronary artery without angina pectoris: Secondary | ICD-10-CM

## 2016-12-20 DIAGNOSIS — I208 Other forms of angina pectoris: Secondary | ICD-10-CM

## 2016-12-20 DIAGNOSIS — R05 Cough: Secondary | ICD-10-CM | POA: Diagnosis not present

## 2016-12-20 MED ORDER — FUROSEMIDE 20 MG PO TABS: 20.0000 mg | ORAL_TABLET | ORAL | 3 refills | Status: DC

## 2016-12-20 MED ORDER — ROSUVASTATIN CALCIUM 20 MG PO TABS: 20.0000 mg | ORAL_TABLET | Freq: Every day | ORAL | 3 refills | Status: DC

## 2016-12-20 MED ORDER — AZITHROMYCIN 250 MG PO TABS: ORAL_TABLET | ORAL | 0 refills | Status: DC

## 2016-12-20 MED ORDER — BENZONATATE 100 MG PO CAPS: 100.0000 mg | ORAL_CAPSULE | Freq: Three times a day (TID) | ORAL | 0 refills | Status: DC | PRN

## 2016-12-20 MED ORDER — ISOSORBIDE MONONITRATE ER 30 MG PO TB24: 15.0000 mg | ORAL_TABLET | Freq: Every day | ORAL | 3 refills | Status: DC

## 2016-12-20 NOTE — Patient Instructions (Addendum)
Medication Instructions:  1. INCREASE CRESTOR TO 20 MG DAILY 2. DECREASE IMDUR TO 15 MG DAILY (THIS WILL BE 1/2 TAB DAILY OF THE 30 MG TABLET) 3. START LASIX 20 MG ; 1 TABLET EVERY MON, WED AND FRI'S 4. A ZPAK HAS BEEN CALLED IN; FOLLOW DIRECTIONS ON PACKAGE 5. TESSALON PERLES HAVE BEEN CALLED IN FOR COUGH : 1 CAP THREE TIMES A DAY AS NEEDED FOR COUGH  Labwork: 1. BMET IN 1 WEEK  2. FASTING LIPID AND LIVER PANEL TO BE DONE IN 3 MONTHS  Testing/Procedures: 1. Your physician has recommended that you wear a 48 HOUR holter monitor. Holter monitors are medical devices that record the heart's electrical activity. Doctors most often use these monitors to diagnose arrhythmias. Arrhythmias are problems with the speed or rhythm of the heartbeat. The monitor is a small, portable device. You can wear one while you do your normal daily activities. This is usually used to diagnose what is causing palpitations/syncope (passing out).  2. A chest x-ray takes a picture of the organs and structures inside the chest, including the heart, lungs, and blood vessels. This test can show several things, including, whether the heart is enlarges; whether fluid is building up in the lungs; and whether pacemaker / defibrillator leads are still in place. THIS IS TO BE DONE TODAY AT [facility]  Follow-Up: 1. KEEP YOUR APPT WITH [name] 01/26/17 2. PER SCOTT WEAVER, PAC YOU WILL NEED TO CALL YOUR PRIMARY CARE TO GET AN APPT TO BE SEEN IN THE NEXT 1-2 WEEKS TO FOLLOW UP ON YOUR BRONCHITIS  Any Other Special Instructions Will Be Listed Below (If Applicable).  If you need a refill on your cardiac medications before your next appointment, please call your pharmacy.

## 2016-12-20 NOTE — Progress Notes (Signed)
Cardiology Office Note:    Date:  12/20/2016   ID:  Christian [name], DOB 13-Jun-1946, MRN [id number]  PCP:  Donnie Coffin, MD  Cardiologist:  Dr. Fransico Him   Electrophysiologist:  n/a  Referring MD: Alroy Dust, Carlean Jews.Marlou Sa, MD   Chief Complaint  Patient presents with  . Follow-up    dyspnea; s/p cath    History of Present Illness:    Christian [name] is a 70 y.o. male with a hx of CAD with prior cardiac catheterization in 2012 demonstrating 80% Dx stenosis too small for PCI. Patient was recently seen for dyspnea with exertion. Nuclear stress test demonstrated no scar or ischemia but EF was 47%. Cardiac catheterization was recommended. This demonstrated moderate nonobstructive CAD with mid LAD 65% stenosis, distal LAD 70% stenosis, ostial D1 70%, mid LCx 35%, OM1 50% proximal RCA 20%. Frequent PVCs were noted during the procedure. Dr. Tamala Julian did the cardiac catheterization and recommended consideration for Holter monitor to assess PVC burden.  He returns for follow up.  Here with his wife.  He denies chest pain. He continues to note dyspnea on exertion.  These symptoms have been going on for 3-4 mos.  He denies orthopnea, PND, edema.  He rides a bike for exercise and had to cut back on his distance a few months ago.  He also notes dyspnea with going up a hill.  He has had issues with his blood pressure running lower ([id number] systolic).  This has been better since his Lisinopril was reduced to 2.5 mg QD.  He denies syncope but has been dizzy at times.  He denies any dietary changes or weight loss.  He has had cough and cold symptoms for 3-4 weeks.  He has not inspected his sputum but feels that he has not had any hemoptysis.  He denies fevers but has been diaphoretic at times.  He does not currently smoke (ex-smoker).  He has noticed some wheezing.  He has not seen his PCP yet.     Prior CV studies that were reviewed today include:    LHC 12/06/16 LAD mid 65, distal 70, D1 ostial 70 LCx mid 35, OM1  50 RCA proximal 20 EF 45-50 Frequent PVCs  Myoview 11/30/16 EF 47, no ischemia or scar  Echo 2/12 EF 68.9, moderate LAE, trace MR, trivial TR, grade 1 diastolic dysfunction   Past Medical History:  Diagnosis Date  . Arthritis    "joints; shoulders; left elbow; right thumb" (05/06/2014)  . Cataracts, bilateral    immature  . Constipation    takes Colace and Metamucil daily  . Coronary artery disease 3/12   80% stenosis of diagonal too small for PCI  . Diastolic dysfunction   . Heart murmur   . Hemorrhoids   . History of bronchitis   . Hypercholesterolemia    takes Crestor daily  . Hypertension    takes Lisinopril and Imdur daily  . Joint pain   . Nocturia   . Orthostatic hypotension   . Sciatica     Past Surgical History:  Procedure Laterality Date  . ACHILLES TENDON REPAIR Left    "& fixed a spur"  . CARDIAC CATHETERIZATION  2012  . CARDIAC CATHETERIZATION N/A 12/06/2016   Procedure: Left Heart Cath and Coronary Angiography;  Surgeon: Belva Crome, MD;  Location: [facility];  Service: Cardiovascular;  Laterality: N/A;  . COLONOSCOPY    . FLEXIBLE SIGMOIDOSCOPY  X 2  . HAND SURGERY Left    "thumb;  cleaned out arthritis"  . HEEL SPUR EXCISION Left   . KNEE ARTHROSCOPY Right   . SHOULDER SURGERY Right X 2   "cleaned out spurs"  . TOE FUSION Right    great toe; plates and screws  . TONSILLECTOMY  ~ 1950  . TOTAL HIP ARTHROPLASTY Right 09/22/2015   Procedure: TOTAL HIP ARTHROPLASTY;  Surgeon: Garald Balding, MD;  Location: [facility];  Service: Orthopedics;  Laterality: Right;  . TOTAL KNEE ARTHROPLASTY Right 05/06/2014  . TOTAL KNEE ARTHROPLASTY Right 05/06/2014   Procedure: RIGHT TOTAL KNEE ARTHROPLASTY;  Surgeon: Garald Balding, MD;  Location: [facility];  Service: Orthopedics;  Laterality: Right;    Current Medications: Current Meds  Medication Sig  . aspirin 81 MG tablet Take 81 mg by mouth daily.  [name] Kitchen docusate sodium (STOOL SOFTENER) 100 MG capsule Take  200 mg by mouth daily.  [name] Kitchen lisinopril (PRINIVIL,ZESTRIL) 2.5 MG tablet Take 1 tablet (2.5 mg total) by mouth daily.  . nitroGLYCERIN (NITROSTAT) 0.4 MG SL tablet PLACE 1 TABLET UNDER THE TONGUE EVERY 5 MINUTES AS NEEDED FOR CHEST PAIN  . [DISCONTINUED] isosorbide mononitrate (IMDUR) 30 MG 24 hr tablet TAKE 1 TABLET BY MOUTH EVERY DAY  . [DISCONTINUED] rosuvastatin (CRESTOR) 10 MG tablet TAKE 1 TABLET(10 MG) BY MOUTH DAILY     Allergies:   Atorvastatin calcium [atorvastatin]; Simvastatin; and Pravastatin   Social History   Social History  . Marital status: Married    Spouse name: N/A  . Number of children: N/A  . Years of education: N/A   Social History Main Topics  . Smoking status: Former Smoker    Packs/day: 2.00    Years: 10.00    Types: Cigarettes  . Smokeless tobacco: Never Used     Comment: quit smoking in 1981  . Alcohol use Yes     Comment: "drink a beer sometimes once/month"  . Drug use: No  . Sexual activity: No   Other Topics Concern  . None   Social History Narrative  . None     Family History:  The patient's family history includes CVA in his father and mother; Heart disease in his father.   ROS:   Please see the history of present illness.    Review of Systems  Cardiovascular: Positive for dyspnea on exertion.  Respiratory: Positive for cough and wheezing.    All other systems reviewed and are negative.   EKGs/Labs/Other Test Reviewed:    EKG:  EKG is  ordered today.  The ekg ordered today demonstrates NSR, HR 72, normal axis, low voltage, QTc 409 ms, RSR' V1-2, no change from prior tracings.   Recent Labs: 04/27/2016: ALT 7 11/29/2016: BUN 16; Creat 1.22; Hemoglobin 11.7; Platelets 267; Potassium 4.8; Sodium 138   Recent Lipid Panel    Component Value Date/Time   CHOL 133 04/27/2016 0826   TRIG 69 04/27/2016 0826   HDL 46 04/27/2016 0826   CHOLHDL 2.9 04/27/2016 0826   VLDL 14 04/27/2016 0826   LDLCALC 73 04/27/2016 0826     Physical Exam:     VS:  BP 124/80   Pulse 72   Ht 5\' 11"  (1.803 m)   Wt 276 lb 12.8 oz (125.6 kg)   BMI 38.61 kg/m     Wt Readings from Last 3 Encounters:  12/20/16 276 lb 12.8 oz (125.6 kg)  12/06/16 273 lb (123.8 kg)  12/05/16 273 lb 1.9 oz (123.9 kg)     Physical Exam  Constitutional: He is  oriented to person, place, and time. He appears well-developed and well-nourished.  HENT:  Head: Normocephalic and atraumatic.  Eyes: No scleral icterus.  Neck: No JVD present.  Cardiovascular: Normal rate, regular rhythm and normal heart sounds.   No murmur heard. Pulmonary/Chest: He has no decreased breath sounds. He has no wheezes. He has rhonchi in the right upper field and the left upper field. He has no rales.  Abdominal: Soft. He exhibits no distension. There is no tenderness.  Musculoskeletal: He exhibits no edema.  right wrist without hematoma or mass   Neurological: He is alert and oriented to person, place, and time.  Skin: Skin is warm and dry.  Psychiatric: He has a normal mood and affect.    ASSESSMENT:    1. Coronary artery disease involving native coronary artery of native heart without angina pectoris   2. Shortness of breath   3. PVCs (premature ventricular contractions)   4. Essential hypertension   5. Hypercholesterolemia   6. Bronchitis    PLAN:    In order of problems listed above:  1. CAD - Moderate non-obstructive CAD by cardiac catheterization.  He denies chest pain.  Continue aggressive RF modification.  His BP is at goal.    -  Continue ASA.    -  Rec more aggressive management of Lipids >> Increase Crestor to 20 mg QD  -  Lipids and LFTs in 3 mos.  2. Shortness of breath - LVEDP was 20 at his cath.  EF has been 45-50 on recent testing.  He has not had an echocardiogram but this is scheduled for next week.  Question if his shortness of breath is related to volume excess 2/2 combined systolic and diastolic CHF.  He did have a lot of PVCs during his cath but no PVCs on  his ECG today.  If his EF is really low, this could be a PVC induced cardiomyopathy.  He is also obese which could be contributing to his shortness of breath.  I also suspect that his current illness is impacting his breathing.  -  Proceed with echocardiogram as planned  -  Trial of Lasix 20 mg every Monday, Wednesday, Friday  -  BMET 1 week  -  Treat Bronchitis  3. PVCs - Dr. Tamala Julian noted frequent PVCs during his cath and recommended Holter monitoring.   -  Echo is pending.   -  Arrange a 23 Hr Holter to assess PVC burden.  4. HTN - BP has run low recently.  Since I am putting him on a low dose of Lasix, I will reduce his Imdur to 15 mg QD to make sure he can tolerate the Lasix.  5. HL - Increase Crestor to 20 mg QD as noted.  Check Lipids and LFTs in 3 mos.    6. Bronchitis - Prolonged cough with sputum production.  He has rhonchi on exam.    -  Z-pak will be Rx'd today  -  Continue Mucinex-DM (otc)  -  Rx for Tessalon Perles 100 mg Three times a day prn (#30, no refills) given  -  CXR today  -  FU with PCP in 1-2 weeks.  Medication Adjustments/Labs and Tests Ordered: Current medicines are reviewed at length with the patient today.  Concerns regarding medicines are outlined above.  Medication changes, Labs and Tests ordered today are outlined in the Patient Instructions noted below. Patient Instructions  Medication Instructions:  1. INCREASE CRESTOR TO 20 MG DAILY 2. DECREASE IMDUR  TO 15 MG DAILY (THIS WILL BE 1/2 TAB DAILY OF THE 30 MG TABLET) 3. START LASIX 20 MG ; 1 TABLET EVERY MON, WED AND FRI'S 4. A ZPAK HAS BEEN CALLED IN; FOLLOW DIRECTIONS ON PACKAGE 5. TESSALON PERLES HAVE BEEN CALLED IN FOR COUGH : 1 CAP THREE TIMES A DAY AS NEEDED FOR COUGH  Labwork: 1. BMET IN 1 WEEK  2. FASTING LIPID AND LIVER PANEL TO BE DONE IN 3 MONTHS  Testing/Procedures: 1. Your physician has recommended that you wear a 48 HOUR holter monitor. Holter monitors are medical devices that record the  heart's electrical activity. Doctors most often use these monitors to diagnose arrhythmias. Arrhythmias are problems with the speed or rhythm of the heartbeat. The monitor is a small, portable device. You can wear one while you do your normal daily activities. This is usually used to diagnose what is causing palpitations/syncope (passing out).  2. A chest x-ray takes a picture of the organs and structures inside the chest, including the heart, lungs, and blood vessels. This test can show several things, including, whether the heart is enlarges; whether fluid is building up in the lungs; and whether pacemaker / defibrillator leads are still in place. THIS IS TO BE DONE TODAY AT [facility]  Follow-Up: 1. KEEP YOUR APPT WITH [name] 01/26/17 2. PER Rafael Salway, PAC YOU WILL NEED TO CALL YOUR PRIMARY CARE TO GET AN APPT TO BE SEEN IN THE NEXT 1-2 WEEKS TO FOLLOW UP ON YOUR BRONCHITIS  Any Other Special Instructions Will Be Listed Below (If Applicable).  If you need a refill on your cardiac medications before your next appointment, please call your pharmacy.  Signed, Richardson Dopp, PA-C  12/20/2016 10:00 AM    [facility] [place], [place], [place]  [address] Phone: [phone]; Fax: [phone]

## 2016-12-20 NOTE — Telephone Encounter (Signed)
Pt notified of CXR results no pneumonia. Pt advised to still f/u with PCP for the bronchitis. Pt agreeable to plan of care.

## 2016-12-27 ENCOUNTER — Other Ambulatory Visit: Payer: Medicare Other | Admitting: *Deleted

## 2016-12-27 ENCOUNTER — Ambulatory Visit (HOSPITAL_COMMUNITY): Payer: Medicare Other | Attending: Cardiovascular Disease

## 2016-12-27 ENCOUNTER — Ambulatory Visit (INDEPENDENT_AMBULATORY_CARE_PROVIDER_SITE_OTHER): Payer: Medicare Other

## 2016-12-27 ENCOUNTER — Other Ambulatory Visit: Payer: Self-pay

## 2016-12-27 DIAGNOSIS — I493 Ventricular premature depolarization: Secondary | ICD-10-CM | POA: Diagnosis not present

## 2016-12-27 DIAGNOSIS — R0602 Shortness of breath: Secondary | ICD-10-CM

## 2016-12-27 DIAGNOSIS — I10 Essential (primary) hypertension: Secondary | ICD-10-CM

## 2016-12-27 DIAGNOSIS — E785 Hyperlipidemia, unspecified: Secondary | ICD-10-CM | POA: Insufficient documentation

## 2016-12-27 DIAGNOSIS — I2510 Atherosclerotic heart disease of native coronary artery without angina pectoris: Secondary | ICD-10-CM | POA: Insufficient documentation

## 2016-12-27 NOTE — Addendum Note (Signed)
Addended by: Eulis Foster on: 12/27/2016 08:52 AM   Modules accepted: Orders

## 2016-12-28 LAB — BASIC METABOLIC PANEL
BUN/Creatinine Ratio: 14 (ref 10–24)
BUN: 17 mg/dL (ref 8–27)
CO2: 24 mmol/L (ref 18–29)
CREATININE: 1.2 mg/dL (ref 0.76–1.27)
Calcium: 9.2 mg/dL (ref 8.6–10.2)
Chloride: 101 mmol/L (ref 96–106)
GFR calc Af Amer: 71 mL/min/{1.73_m2} (ref >59)
GFR, EST NON AFRICAN AMERICAN: 61 mL/min/{1.73_m2} (ref >59)
GLUCOSE: 81 mg/dL (ref 65–99)
Potassium: 4.7 mmol/L (ref 3.5–5.2)
Sodium: 139 mmol/L (ref 134–144)

## 2017-01-05 ENCOUNTER — Telehealth: Payer: Self-pay

## 2017-01-05 DIAGNOSIS — I10 Essential (primary) hypertension: Secondary | ICD-10-CM

## 2017-01-05 DIAGNOSIS — R0602 Shortness of breath: Secondary | ICD-10-CM

## 2017-01-05 MED ORDER — ISOSORBIDE MONONITRATE ER 30 MG PO TB24: 30.0000 mg | ORAL_TABLET | Freq: Every day | ORAL | 3 refills | Status: DC

## 2017-01-05 NOTE — Telephone Encounter (Signed)
Instructed patient to INCREASE IMDUR to 30 mg daily. BNP scheduled tomorrow. Confirmed OV with [name] 2/1. Patient agrees with treatment plan.

## 2017-01-05 NOTE — Telephone Encounter (Signed)
-----   Message from Sueanne Margarita, MD sent at 01/05/2017 10:19 AM EST ----- Please have patient come in for a BNP.  LVEDP was elevated at cath and he is on Lasix. EF was normal on echo.  Cath with moderate CAD.  Increase Imdur to 30mg  daily and followup with me in 3 weeks.

## 2017-01-06 ENCOUNTER — Other Ambulatory Visit: Payer: Medicare Other | Admitting: *Deleted

## 2017-01-06 DIAGNOSIS — R0602 Shortness of breath: Secondary | ICD-10-CM

## 2017-01-07 LAB — SPECIMEN STATUS

## 2017-01-13 LAB — PRO B NATRIURETIC PEPTIDE: NT-PRO BNP: 120 pg/mL (ref 0–376)

## 2017-01-14 LAB — CBC WITH DIFFERENTIAL/PLATELET
BASOS ABS: 0.1 10*3/uL (ref 0.0–0.2)
Basos: 1 %
EOS (ABSOLUTE): 0.3 10*3/uL (ref 0.0–0.4)
Eos: 4 %
HEMATOCRIT: 37.3 % — AB (ref 37.5–51.0)
HEMOGLOBIN: 11.3 g/dL — AB (ref 13.0–17.7)
Immature Grans (Abs): 0 10*3/uL (ref 0.0–0.1)
Immature Granulocytes: 0 %
LYMPHS ABS: 2 10*3/uL (ref 0.7–3.1)
Lymphs: 26 %
MCH: 25.4 pg — AB (ref 26.6–33.0)
MCHC: 30.3 g/dL — AB (ref 31.5–35.7)
MCV: 84 fL (ref 79–97)
MONOCYTES: 7 %
MONOS ABS: 0.6 10*3/uL (ref 0.1–0.9)
NEUTROS ABS: 4.7 10*3/uL (ref 1.4–7.0)
Neutrophils: 62 %
Platelets: 303 10*3/uL (ref 150–379)
RBC: 4.45 x10E6/uL (ref 4.14–5.80)
RDW: 17.6 % — ABNORMAL HIGH (ref 12.3–15.4)
WBC: 7.7 10*3/uL (ref 3.4–10.8)

## 2017-01-14 LAB — SPECIMEN STATUS REPORT

## 2017-01-26 ENCOUNTER — Ambulatory Visit (INDEPENDENT_AMBULATORY_CARE_PROVIDER_SITE_OTHER): Payer: Medicare Other | Admitting: Cardiology

## 2017-01-26 ENCOUNTER — Encounter: Payer: Self-pay | Admitting: Cardiology

## 2017-01-26 VITALS — BP 130/82 | HR 75 | Ht 71.0 in | Wt 275.8 lb

## 2017-01-26 DIAGNOSIS — E7800 Pure hypercholesterolemia, unspecified: Secondary | ICD-10-CM

## 2017-01-26 DIAGNOSIS — I10 Essential (primary) hypertension: Secondary | ICD-10-CM | POA: Diagnosis not present

## 2017-01-26 DIAGNOSIS — I493 Ventricular premature depolarization: Secondary | ICD-10-CM | POA: Insufficient documentation

## 2017-01-26 DIAGNOSIS — I5032 Chronic diastolic (congestive) heart failure: Secondary | ICD-10-CM

## 2017-01-26 DIAGNOSIS — I2510 Atherosclerotic heart disease of native coronary artery without angina pectoris: Secondary | ICD-10-CM | POA: Diagnosis not present

## 2017-01-26 LAB — HEPATIC FUNCTION PANEL
ALBUMIN: 4 g/dL (ref 3.6–4.8)
ALT: 9 IU/L (ref 0–44)
AST: 21 IU/L (ref 0–40)
Alkaline Phosphatase: 62 IU/L (ref 39–117)
BILIRUBIN TOTAL: 0.4 mg/dL (ref 0.0–1.2)
Bilirubin, Direct: 0.12 mg/dL (ref 0.00–0.40)
Total Protein: 6.5 g/dL (ref 6.0–8.5)

## 2017-01-26 LAB — LIPID PANEL
Chol/HDL Ratio: 2.9 ratio units (ref 0.0–5.0)
Cholesterol, Total: 135 mg/dL (ref 100–199)
HDL: 47 mg/dL (ref >39)
LDL Calculated: 74 mg/dL (ref 0–99)
TRIGLYCERIDES: 71 mg/dL (ref 0–149)
VLDL CHOLESTEROL CAL: 14 mg/dL (ref 5–40)

## 2017-01-26 LAB — BASIC METABOLIC PANEL
BUN/Creatinine Ratio: 14 (ref 10–24)
BUN: 17 mg/dL (ref 8–27)
CALCIUM: 9 mg/dL (ref 8.6–10.2)
CHLORIDE: 99 mmol/L (ref 96–106)
CO2: 22 mmol/L (ref 18–29)
Creatinine, Ser: 1.2 mg/dL (ref 0.76–1.27)
GFR, EST AFRICAN AMERICAN: 71 mL/min/{1.73_m2} (ref >59)
GFR, EST NON AFRICAN AMERICAN: 61 mL/min/{1.73_m2} (ref >59)
Glucose: 90 mg/dL (ref 65–99)
POTASSIUM: 4.5 mmol/L (ref 3.5–5.2)
SODIUM: 137 mmol/L (ref 134–144)

## 2017-01-26 LAB — PRO B NATRIURETIC PEPTIDE: NT-Pro BNP: 236 pg/mL (ref 0–376)

## 2017-01-26 NOTE — Progress Notes (Signed)
Cardiology Office Note    Date:  01/26/2017   ID:  Christian [name], DOB 07-09-1946, MRN [id number]  PCP:  Donnie Coffin, MD  Cardiologist:  Fransico Him, MD   Chief Complaint  Patient presents with  . Coronary Artery Disease  . Hypertension  . Hyperlipidemia    History of Present Illness:  Christian [name] is a 71 y.o. male with a history of ASCAD with recent cath showing 20% RCA, 50% OM, 35% mid LCx, 70% D1, 65% mid LAD, 70% distal LAD 80% on medical management, Elevated LVEDP at 20 at the time of cath with EF 45-50% by cath and 50-55% by echo.  He also has HTN and dyslipidemia who presents today for followup. He is doing well.  He denies any chest pain, LE edema, dizziness or syncope. He is still complaining of DOE.  This occurs mainly when walking up an incline but is ok on flat ground.  He denies any PND or orthopnea.   He says that he is not really feeling any palpitations now.  He is walking 45 minutes and 20 minutes a day with the dog and is fine except for going up an incline.    Past Medical History:  Diagnosis Date  . Arthritis    "joints; shoulders; left elbow; right thumb" (05/06/2014)  . Cataracts, bilateral    immature  . Constipation    takes Colace and Metamucil daily  . Coronary artery disease 02/2011   cath 11/2016 showing 20% RCA, 50% OM, 35% mid LCx, 70% D1, 65% mid LAD, 70% distal LAD 80% on medical management  . Diastolic dysfunction   . Heart murmur   . Hemorrhoids   . History of bronchitis   . Hypercholesterolemia    takes Crestor daily  . Hypertension    takes Lisinopril and Imdur daily  . Joint pain   . Nocturia   . Orthostatic hypotension   . PVC's (premature ventricular contractions)    PVC load 0.2% by Holter 12/2016  . Sciatica     Past Surgical History:  Procedure Laterality Date  . ACHILLES TENDON REPAIR Left    "& fixed a spur"  . CARDIAC CATHETERIZATION  2012  . CARDIAC CATHETERIZATION N/A 12/06/2016   Procedure: Left Heart Cath  and Coronary Angiography;  Surgeon: Belva Crome, MD;  Location: [facility];  Service: Cardiovascular;  Laterality: N/A;  . COLONOSCOPY    . FLEXIBLE SIGMOIDOSCOPY  X 2  . HAND SURGERY Left    "thumb; cleaned out arthritis"  . HEEL SPUR EXCISION Left   . KNEE ARTHROSCOPY Right   . SHOULDER SURGERY Right X 2   "cleaned out spurs"  . TOE FUSION Right    great toe; plates and screws  . TONSILLECTOMY  ~ 1950  . TOTAL HIP ARTHROPLASTY Right 09/22/2015   Procedure: TOTAL HIP ARTHROPLASTY;  Surgeon: Garald Balding, MD;  Location: [facility];  Service: Orthopedics;  Laterality: Right;  . TOTAL KNEE ARTHROPLASTY Right 05/06/2014  . TOTAL KNEE ARTHROPLASTY Right 05/06/2014   Procedure: RIGHT TOTAL KNEE ARTHROPLASTY;  Surgeon: Garald Balding, MD;  Location: [facility];  Service: Orthopedics;  Laterality: Right;    Current Medications: Outpatient Medications Prior to Visit  Medication Sig Dispense Refill  . aspirin 81 MG tablet Take 81 mg by mouth daily.    [name] Kitchen docusate sodium (STOOL SOFTENER) 100 MG capsule Take 200 mg by mouth daily.    . furosemide (LASIX) 20 MG tablet Take  1 tablet (20 mg total) by mouth every Monday, Wednesday, and Friday. 90 tablet 3  . isosorbide mononitrate (IMDUR) 30 MG 24 hr tablet Take 1 tablet (30 mg total) by mouth daily. 90 tablet 3  . lisinopril (PRINIVIL,ZESTRIL) 2.5 MG tablet Take 1 tablet (2.5 mg total) by mouth daily. 90 tablet 30  . nitroGLYCERIN (NITROSTAT) 0.4 MG SL tablet PLACE 1 TABLET UNDER THE TONGUE EVERY 5 MINUTES AS NEEDED FOR CHEST PAIN 75 tablet 3  . rosuvastatin (CRESTOR) 20 MG tablet Take 1 tablet (20 mg total) by mouth daily. 90 tablet 3  . azithromycin (ZITHROMAX Z-PAK) 250 MG tablet Take 2 tablets day one; then take 1 tablet daily for the next 4 days; then stop (Patient not taking: Reported on 01/26/2017) 6 each 0  . benzonatate (TESSALON) 100 MG capsule Take 1 capsule (100 mg total) by mouth 3 (three) times daily as needed for cough. (Patient not  taking: Reported on 01/26/2017) 20 capsule 0   No facility-administered medications prior to visit.      Allergies:   Atorvastatin calcium [atorvastatin]; Simvastatin; and Pravastatin   Social History   Social History  . Marital status: Married    Spouse name: N/A  . Number of children: N/A  . Years of education: N/A   Social History Main Topics  . Smoking status: Former Smoker    Packs/day: 2.00    Years: 10.00    Types: Cigarettes  . Smokeless tobacco: Never Used     Comment: quit smoking in 1981  . Alcohol use Yes     Comment: "drink a beer sometimes once/month"  . Drug use: No  . Sexual activity: No   Other Topics Concern  . None   Social History Narrative  . None     Family History:  The patient's family history includes CVA in his father and mother; Heart disease in his father.   ROS:   Please see the history of present illness.    ROS All other systems reviewed and are negative.  No flowsheet data found.     PHYSICAL EXAM:   VS:  BP 130/82   Pulse 75   Ht 5\' 11"  (1.803 m)   Wt 275 lb 12.8 oz (125.1 kg)   SpO2 97%   BMI 38.47 kg/m    GEN: Well nourished, well developed, in no acute distress  HEENT: normal  Neck: no JVD, carotid bruits, or masses Cardiac: RRR; no murmurs, rubs, or gallops,no edema.  Intact distal pulses bilaterally.  Respiratory:  clear to auscultation bilaterally, normal work of breathing GI: soft, nontender, nondistended, + BS MS: no deformity or atrophy  Skin: warm and dry, no rash Neuro:  Alert and Oriented x 3, Strength and sensation are intact Psych: euthymic mood, full affect  Wt Readings from Last 3 Encounters:  01/26/17 275 lb 12.8 oz (125.1 kg)  12/20/16 276 lb 12.8 oz (125.6 kg)  12/06/16 273 lb (123.8 kg)      Studies/Labs Reviewed:   EKG:  EKG is not ordered today.    Recent Labs: 04/27/2016: ALT 7 11/29/2016: Hemoglobin 11.7 12/27/2016: BUN 17; Creatinine, Ser 1.20; Potassium 4.7; Sodium 139 01/06/2017: NT-Pro  BNP 120; Platelets WILL FOLLOW; Platelets 303   Lipid Panel    Component Value Date/Time   CHOL 133 04/27/2016 0826   TRIG 69 04/27/2016 0826   HDL 46 04/27/2016 0826   CHOLHDL 2.9 04/27/2016 0826   VLDL 14 04/27/2016 0826   LDLCALC 73 04/27/2016 0826  Additional studies/ records that were reviewed today include:  none    ASSESSMENT:    1. Coronary artery disease involving native coronary artery of native heart without angina pectoris   2. Essential hypertension   3. Hypercholesterolemia   4. PVC's (premature ventricular contractions)      PLAN:  In order of problems listed above:  1. ASCAD with recent cath showing 20% RCA, 50% OM, 35% mid LCx, 70% D1, 65% mid LAD, 70% distal LAD 80% on medical management.  He will continue ASA/long acting nitrate and statin. 2. HTN - BP controlled on current meds.  He will continue ACE I.  Check BMET. 3. Hyperlipidemia with LDL goal < 70.  Continue statin. Check FLP and ALT.  4. PVCs - PVC load 0.2% on recent Holter.   5. Chronic diastolic CHF - LVEDP was elevated at time of cath.  He is now on Lasix 3 times weekly.  I will check a BNP and if normal then will order PFTs with DLCO.    Medication Adjustments/Labs and Tests Ordered: Current medicines are reviewed at length with the patient today.  Concerns regarding medicines are outlined above.  Medication changes, Labs and Tests ordered today are listed in the Patient Instructions below.  There are no Patient Instructions on file for this visit.   Signed, Fransico Him, MD  01/26/2017 8:25 AM    [facility] [place], [place], [place]  [address] Phone: [phone]; Fax: [phone]

## 2017-01-26 NOTE — Patient Instructions (Signed)
Medication Instructions:  Your physician recommends that you continue on your current medications as directed. Please refer to the Current Medication list given to you today.   Labwork: TODAY: BMET, LFTs, Lipids, BNP  Testing/Procedures: None  Follow-Up: Your physician wants you to follow-up in: 6 months with [name]. You will receive a reminder letter in the mail two months in advance. If you don't receive a letter, please call our office to schedule the follow-up appointment.   Any Other Special Instructions Will Be Listed Below (If Applicable).     If you need a refill on your cardiac medications before your next appointment, please call your pharmacy.

## 2017-02-09 ENCOUNTER — Encounter: Payer: Self-pay | Admitting: Cardiology

## 2017-02-14 DIAGNOSIS — M150 Primary generalized (osteo)arthritis: Secondary | ICD-10-CM | POA: Diagnosis not present

## 2017-02-14 DIAGNOSIS — E669 Obesity, unspecified: Secondary | ICD-10-CM | POA: Diagnosis not present

## 2017-02-14 DIAGNOSIS — I10 Essential (primary) hypertension: Secondary | ICD-10-CM | POA: Diagnosis not present

## 2017-02-14 DIAGNOSIS — I2510 Atherosclerotic heart disease of native coronary artery without angina pectoris: Secondary | ICD-10-CM | POA: Diagnosis not present

## 2017-02-14 DIAGNOSIS — E7800 Pure hypercholesterolemia, unspecified: Secondary | ICD-10-CM | POA: Diagnosis not present

## 2017-02-21 ENCOUNTER — Other Ambulatory Visit (INDEPENDENT_AMBULATORY_CARE_PROVIDER_SITE_OTHER): Payer: Self-pay | Admitting: Orthopedic Surgery

## 2017-02-21 MED ORDER — DICLOFENAC POTASSIUM 50 MG PO TABS: 50.0000 mg | ORAL_TABLET | Freq: Two times a day (BID) | ORAL | 0 refills | Status: DC

## 2017-03-20 ENCOUNTER — Other Ambulatory Visit: Payer: Medicare Other

## 2017-04-10 DIAGNOSIS — N281 Cyst of kidney, acquired: Secondary | ICD-10-CM | POA: Diagnosis not present

## 2017-05-16 ENCOUNTER — Other Ambulatory Visit (INDEPENDENT_AMBULATORY_CARE_PROVIDER_SITE_OTHER): Payer: Self-pay

## 2017-05-16 ENCOUNTER — Telehealth (INDEPENDENT_AMBULATORY_CARE_PROVIDER_SITE_OTHER): Payer: Self-pay | Admitting: Orthopaedic Surgery

## 2017-05-16 MED ORDER — AMOXICILLIN 500 MG PO TABS: ORAL_TABLET | ORAL | 0 refills | Status: DC

## 2017-05-16 NOTE — Telephone Encounter (Signed)
Yes, it would be a good idea to take antibx   Amoxicillin 500 mg   4 tabs 2 hours prior to procedure

## 2017-05-16 NOTE — Telephone Encounter (Signed)
Faxed rx in and called pt.

## 2017-05-16 NOTE — Telephone Encounter (Signed)
Please advise 

## 2017-05-16 NOTE — Telephone Encounter (Signed)
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 Took his last four this morning. Going back to have a permanent cap put on in two weeks Patient was under the impression he did not need one after two years post op THR. Please call patient to advise. Patient uses [facility] on [address] in [place].

## 2017-05-29 ENCOUNTER — Encounter: Payer: Self-pay | Admitting: Cardiology

## 2017-05-31 ENCOUNTER — Telehealth: Payer: Self-pay

## 2017-05-31 MED ORDER — LISINOPRIL 5 MG PO TABS: 5.0000 mg | ORAL_TABLET | Freq: Every day | ORAL | 3 refills | Status: DC

## 2017-05-31 NOTE — Telephone Encounter (Signed)
Discussed BP with RPH (see MyChart messages). Per RPH, instructed patient to increase Lisinopril to 5 mg daily. Patient states he has been taking Lisinopril 5 mg daily for about a week and his BP is "back to normal" - today it was 109/76. Patient will check BP daily and send BP readings in about a week. Confirmed OV with [name] is July. Patient was grateful for call and agrees with treatment plan.

## 2017-06-07 ENCOUNTER — Encounter: Payer: Self-pay | Admitting: Cardiology

## 2017-06-08 ENCOUNTER — Other Ambulatory Visit: Payer: Self-pay

## 2017-06-08 DIAGNOSIS — I10 Essential (primary) hypertension: Secondary | ICD-10-CM

## 2017-06-08 MED ORDER — LISINOPRIL 10 MG PO TABS: 10.0000 mg | ORAL_TABLET | Freq: Every day | ORAL | 3 refills | Status: DC

## 2017-06-15 ENCOUNTER — Encounter: Payer: Self-pay | Admitting: Cardiology

## 2017-06-16 ENCOUNTER — Other Ambulatory Visit: Payer: Medicare Other | Admitting: *Deleted

## 2017-06-16 DIAGNOSIS — I10 Essential (primary) hypertension: Secondary | ICD-10-CM | POA: Diagnosis not present

## 2017-06-16 LAB — BASIC METABOLIC PANEL
BUN/Creatinine Ratio: 15 (ref 10–24)
BUN: 16 mg/dL (ref 8–27)
CALCIUM: 9 mg/dL (ref 8.6–10.2)
CHLORIDE: 107 mmol/L — AB (ref 96–106)
CO2: 22 mmol/L (ref 20–29)
Creatinine, Ser: 1.1 mg/dL (ref 0.76–1.27)
GFR calc non Af Amer: 68 mL/min/{1.73_m2} (ref >59)
GFR, EST AFRICAN AMERICAN: 78 mL/min/{1.73_m2} (ref >59)
Glucose: 95 mg/dL (ref 65–99)
POTASSIUM: 4.4 mmol/L (ref 3.5–5.2)
SODIUM: 139 mmol/L (ref 134–144)

## 2017-06-23 ENCOUNTER — Telehealth: Payer: Self-pay | Admitting: *Deleted

## 2017-06-23 MED ORDER — LISINOPRIL 20 MG PO TABS: 20.0000 mg | ORAL_TABLET | Freq: Every day | ORAL | 3 refills | Status: DC

## 2017-06-23 NOTE — Telephone Encounter (Signed)
Diastolic BP still too high - increase lisinopri to 20mg  daily and check BMET in 1 week with followup in HTN clinic in 1 week    Fransico Him, MD   Spoke with pt and went over recommendations per [name].  Pt agreeable to plan.  Scheduled pt to come into HTN clinic 06/29/17.  Pt will have labs same day.

## 2017-06-29 ENCOUNTER — Encounter: Payer: Self-pay | Admitting: Pharmacist

## 2017-06-29 ENCOUNTER — Other Ambulatory Visit: Payer: Self-pay

## 2017-06-29 ENCOUNTER — Other Ambulatory Visit: Payer: Medicare Other

## 2017-06-29 ENCOUNTER — Ambulatory Visit (INDEPENDENT_AMBULATORY_CARE_PROVIDER_SITE_OTHER): Payer: Medicare Other | Admitting: Pharmacist

## 2017-06-29 ENCOUNTER — Other Ambulatory Visit: Payer: Self-pay | Admitting: Cardiology

## 2017-06-29 VITALS — BP 132/92 | HR 55

## 2017-06-29 DIAGNOSIS — I10 Essential (primary) hypertension: Secondary | ICD-10-CM

## 2017-06-29 DIAGNOSIS — I2510 Atherosclerotic heart disease of native coronary artery without angina pectoris: Secondary | ICD-10-CM

## 2017-06-29 MED ORDER — LISINOPRIL-HYDROCHLOROTHIAZIDE 20-12.5 MG PO TABS: 1.0000 | ORAL_TABLET | Freq: Every day | ORAL | 1 refills | Status: DC

## 2017-06-29 MED ORDER — HYDROCHLOROTHIAZIDE 12.5 MG PO CAPS: 12.5000 mg | ORAL_CAPSULE | Freq: Every day | ORAL | 1 refills | Status: DC

## 2017-06-29 NOTE — Patient Instructions (Signed)
Return for a follow up appointment in 3-4 weeks  Check your blood pressure at home daily (if able) and keep record of the readings.  Take your BP meds as follows: START taking lisinopril/hydrochlorothiazide 20/12.5mg  daily (you may use your current supply and take hydrochlorothiazide separately until you complete that supply then start 1 tablet daily of the combination tablet)  Bring all of your meds, your BP cuff and your record of home blood pressures to your next appointment.  Exercise as you're able, try to walk approximately 30 minutes per day.  Keep salt intake to a minimum, especially watch canned and prepared boxed foods.  Eat more fresh fruits and vegetables and fewer canned items.  Avoid eating in fast food restaurants.    HOW TO TAKE YOUR BLOOD PRESSURE: . Rest 5 minutes before taking your blood pressure. .  Don't smoke or drink caffeinated beverages for at least 30 minutes before. . Take your blood pressure before (not after) you eat. . Sit comfortably with your back supported and both feet on the floor (don't cross your legs). . Elevate your arm to heart level on a table or a desk. . Use the proper sized cuff. It should fit smoothly and snugly around your bare upper arm. There should be enough room to slip a fingertip under the cuff. The bottom edge of the cuff should be 1 inch above the crease of the elbow. . Ideally, take 3 measurements at one sitting and record the average.

## 2017-06-29 NOTE — Progress Notes (Signed)
Patient ID: Christian [name]                 DOB: Apr 11, 1946                      MRN: [id number]     HPI: Christian [name] is a 71 y.o. male patient of [name] who presents today for hypertension evaluation. PMH includes ASCAD with recent cath showing 20% RCA, 50% OM, 35% mid LCx, 70% D1, 65% mid LAD, 70% distal LAD 80% on medical management, Elevated LVEDP at 20 at the time of cath with EF 45-50% by cath and 50-55% by echo.  He also has HTN and dyslipidemia. He recently called/emailed in about elevated pressures at home and his lisinopril was increased to 20mg  daily.   He presents today with his wife for labs and HTN visit. He states he is surprised his pressure is not lower. He reports his pressure has been running in 130s/80s.   He also reports that his SOB has not improved at all over the last few months. SOB so bad when exercising or going up hills specifically.   Current HTN meds:  Lisinopril 20mg  daily in the morning Imdur 30mg  daily in the morning Furosemide 20mg  on MWF  Previously tried: none  BP goal: <130/80  Family History: CVA in his father and mother; Heart disease in his father.   Social History: Former smoker - Quit in 1981. Social alcohol only.   Diet: Most meals prepared from home. Does not use salt. Drink 2 cups of coffee and 4-5 glasses of tea. Rare soda.   Exercise: Walk dog 3-4 times per day. Tries to walk to in morning, but limited due to SOB.   Home BP readings: 130s/80s mostly. Did have one or two measurements 120s/80s.   Wt Readings from Last 3 Encounters:  01/26/17 275 lb 12.8 oz (125.1 kg)  12/20/16 276 lb 12.8 oz (125.6 kg)  12/06/16 273 lb (123.8 kg)   BP Readings from Last 3 Encounters:  06/29/17 (!) 132/92  01/26/17 130/82  12/20/16 124/80   Pulse Readings from Last 3 Encounters:  06/29/17 (!) 55  01/26/17 75  12/20/16 72    Renal function: CrCl cannot be calculated (Unknown ideal weight.).  Past Medical History:  Diagnosis Date  .  Arthritis    "joints; shoulders; left elbow; right thumb" (05/06/2014)  . Cataracts, bilateral    immature  . Constipation    takes Colace and Metamucil daily  . Coronary artery disease 02/2011   cath 11/2016 showing 20% RCA, 50% OM, 35% mid LCx, 70% D1, 65% mid LAD, 70% distal LAD 80% on medical management  . Diastolic dysfunction   . Heart murmur   . Hemorrhoids   . History of bronchitis   . Hypercholesterolemia    takes Crestor daily  . Hypertension   . Joint pain   . Nocturia   . Orthostatic hypotension   . PVC's (premature ventricular contractions)    PVC load 0.2% by Holter 12/2016  . Sciatica     Current Outpatient Prescriptions on File Prior to Visit  Medication Sig Dispense Refill  . amoxicillin (AMOXIL) 500 MG tablet Take 4 tablets 2 hours prior to any dental procedure 21 tablet 0  . aspirin 81 MG tablet Take 81 mg by mouth daily.    . diclofenac (CATAFLAM) 50 MG tablet Take 1 tablet (50 mg total) by mouth 2 (two) times daily. (Patient taking differently: Take 50  mg by mouth 2 (two) times daily. Pt takes as needed) 180 tablet 0  . docusate sodium (STOOL SOFTENER) 100 MG capsule Take 200 mg by mouth daily.    . furosemide (LASIX) 20 MG tablet Take 1 tablet (20 mg total) by mouth every Monday, Wednesday, and Friday. 90 tablet 3  . isosorbide mononitrate (IMDUR) 30 MG 24 hr tablet Take 1 tablet (30 mg total) by mouth daily. 90 tablet 3  . nitroGLYCERIN (NITROSTAT) 0.4 MG SL tablet PLACE 1 TABLET UNDER THE TONGUE EVERY 5 MINUTES AS NEEDED FOR CHEST PAIN 75 tablet 3  . rosuvastatin (CRESTOR) 20 MG tablet Take 1 tablet (20 mg total) by mouth daily. 90 tablet 3   No current facility-administered medications on file prior to visit.     Allergies  Allergen Reactions  . Atorvastatin Calcium [Atorvastatin] Other (See Comments)    Myalgia  . Simvastatin Other (See Comments)    Myalgia  . Pravastatin Other (See Comments)    Leg Cramps    Blood pressure (!) 132/92, pulse (!)  55, SpO2 98 %.   Assessment/Plan: Hypertension: BP is elevated above goal. Will add HCTZ to lisinopril. He will start lisinopril 20/12.5mg  daily (He requests a short supply of HCTZ 12.5mg  be sent then he will start combination tablet). BMET today after increasing lisinopril dose. He will follow up with [name] as scheduled in 3 weeks.    Thank you, Lelan Pons. Patterson Hammersmith, [facility]  06/29/2017 2:51 PM

## 2017-06-30 LAB — BASIC METABOLIC PANEL
BUN / CREAT RATIO: 20 (ref 10–24)
BUN: 22 mg/dL (ref 8–27)
CO2: 23 mmol/L (ref 20–29)
Calcium: 8.8 mg/dL (ref 8.6–10.2)
Chloride: 102 mmol/L (ref 96–106)
Creatinine, Ser: 1.08 mg/dL (ref 0.76–1.27)
GFR calc Af Amer: 80 mL/min/{1.73_m2} (ref >59)
GFR calc non Af Amer: 69 mL/min/{1.73_m2} (ref >59)
GLUCOSE: 90 mg/dL (ref 65–99)
POTASSIUM: 4.8 mmol/L (ref 3.5–5.2)
SODIUM: 137 mmol/L (ref 134–144)

## 2017-07-03 ENCOUNTER — Telehealth: Payer: Self-pay | Admitting: Cardiology

## 2017-07-03 NOTE — Telephone Encounter (Signed)
New message    Pt c/o BP issue: STAT if pt c/o blurred vision, one-sided weakness or slurred speech  1. What are your last 5 BP readings? 88/70, 93/70, 102/59, 91/64  2. Are you having any other symptoms (ex. Dizziness, headache, blurred vision, passed out)? No-pt states he just feels like crap.  3. What is your BP issue? Pt is calling stating that his bp is going low.

## 2017-07-03 NOTE — Telephone Encounter (Signed)
Returned call to pt - he states his BP readings have dropped since starting HCTZ (see below for readings) and HR has increased from mid 60s to the 90s and he has not been feeling well. Advised pt to discontinue HCTZ and to just remain on lisinopril 20mg  daily. His BP was 130/92 on this dose previously. Advised him to check his BP tonight and tomorrow morning and to take his next dose of lisinopril when his systolic BP is at least 485. He verbalized understanding. Pt will keep f/u with Dr Radford Pax in a few weeks as scheduled.

## 2017-07-03 NOTE — Addendum Note (Signed)
Addended by: Travarius Lange E on: 07/03/2017 01:49 PM   Modules accepted: Orders

## 2017-07-16 NOTE — Progress Notes (Signed)
Cardiology Office Note:    Date:  07/17/2017   ID:  Christian [name], DOB 1946-09-17, MRN [id number]  PCP:  Aurea Graff.Marlou Sa, MD  Cardiologist:  Fransico Him, MD   Referring MD: Alroy Dust, Carlean Jews.Marlou Sa, MD   Chief Complaint  Patient presents with  . Coronary Artery Disease  . Hypertension  . Hyperlipidemia    History of Present Illness:    Christian [name] is a 71 y.o. male with a hx of with a history of ASCAD with  cath 11/2016 showing 20% RCA, 50% OM, 35% mid LCx, 70% D1, 65% mid LAD, 70% distal LAD  on medical management.  He also was noted to have an elevated LVEDP at 20 with EF 45-50% by cath and 50-55% by echo.  He has a history of HTN and dyslipidemia as well.   He is here today for followup and is doing well but yesterday he was tired and hot and walked up a long hill and he started getting SOB with chest thighttness that resolved with rest.   He has chronic stable angina that is stable and usually when doing extreme exertion going up an incline.  He thinks his anginal symptoms are stable.  He denies any dizziness or syncope. He has chronic DOE which is stable.  He denies any PND or orthopnea. He denies any palpitations, dizziness (except when getting up too fast) or syncope.   He continues to walk 45 minutes and 20 minutes a day with the dog and is fine except for going up an incline which results in DOE.   Past Medical History:  Diagnosis Date  . Arthritis    "joints; shoulders; left elbow; right thumb" (05/06/2014)  . Cataracts, bilateral    immature  . Constipation    takes Colace and Metamucil daily  . Coronary artery disease 02/2011   cath 11/2016 showing 20% RCA, 50% OM, 35% mid LCx, 70% D1, 65% mid LAD, 70% distal LAD 80% on medical management  . Diastolic dysfunction   . Heart murmur   . Hemorrhoids   . History of bronchitis   . Hypercholesterolemia    takes Crestor daily  . Hypertension   . Joint pain   . Nocturia   . Orthostatic hypotension   . PVC's (premature  ventricular contractions)    PVC load 0.2% by Holter 12/2016  . Sciatica     Past Surgical History:  Procedure Laterality Date  . ACHILLES TENDON REPAIR Left    "& fixed a spur"  . CARDIAC CATHETERIZATION  2012  . CARDIAC CATHETERIZATION N/A 12/06/2016   Procedure: Left Heart Cath and Coronary Angiography;  Surgeon: Belva Crome, MD;  Location: [place] [facility];  Service: Cardiovascular;  Laterality: N/A;  . COLONOSCOPY    . FLEXIBLE SIGMOIDOSCOPY  X 2  . HAND SURGERY Left    "thumb; cleaned out arthritis"  . HEEL SPUR EXCISION Left   . KNEE ARTHROSCOPY Right   . SHOULDER SURGERY Right X 2   "cleaned out spurs"  . TOE FUSION Right    great toe; plates and screws  . TONSILLECTOMY  ~ 1950  . TOTAL HIP ARTHROPLASTY Right 09/22/2015   Procedure: TOTAL HIP ARTHROPLASTY;  Surgeon: Garald Balding, MD;  Location: [facility];  Service: Orthopedics;  Laterality: Right;  . TOTAL KNEE ARTHROPLASTY Right 05/06/2014  . TOTAL KNEE ARTHROPLASTY Right 05/06/2014   Procedure: RIGHT TOTAL KNEE ARTHROPLASTY;  Surgeon: Garald Balding, MD;  Location: [facility];  Service: Orthopedics;  Laterality: Right;    Current Medications: Current Meds  Medication Sig  . amoxicillin (AMOXIL) 500 MG tablet Take 4 tablets 2 hours prior to any dental procedure  . aspirin 81 MG tablet Take 81 mg by mouth daily.  . diclofenac (CATAFLAM) 50 MG tablet Take 1 tablet (50 mg total) by mouth 2 (two) times daily. (Patient taking differently: Take 50 mg by mouth 2 (two) times daily. Pt takes as needed)  . docusate sodium (STOOL SOFTENER) 100 MG capsule Take 200 mg by mouth daily.  [name] Kitchen lisinopril (PRINIVIL,ZESTRIL) 20 MG tablet Take 1 tablet (20 mg total) by mouth daily.  . nitroGLYCERIN (NITROSTAT) 0.4 MG SL tablet PLACE 1 TABLET UNDER THE TONGUE EVERY 5 MINUTES AS NEEDED FOR CHEST PAIN     Allergies:   Atorvastatin calcium [atorvastatin]; Simvastatin; and Pravastatin   Social History   Social History  . Marital status:  Married    Spouse name: N/A  . Number of children: N/A  . Years of education: N/A   Social History Main Topics  . Smoking status: Former Smoker    Packs/day: 2.00    Years: 10.00    Types: Cigarettes  . Smokeless tobacco: Never Used     Comment: quit smoking in 1981  . Alcohol use Yes     Comment: "drink a beer sometimes once/month"  . Drug use: No  . Sexual activity: No   Other Topics Concern  . None   Social History Narrative  . None     Family History: The patient's family history includes CVA in his father and mother; Heart disease in his father.  ROS:   Please see the history of present illness.   Joint swelling in the knee  All other systems reviewed and are negative.  EKGs/Labs/Other Studies Reviewed:    The following studies were reviewed today: none  EKG:  EKG is not ordered today.    Recent Labs: 01/06/2017: Hemoglobin WILL FOLLOW; Hemoglobin 11.3; Platelets WILL FOLLOW; Platelets 303 01/26/2017: ALT 9; NT-Pro BNP 236 06/29/2017: BUN 22; Creatinine, Ser 1.08; Potassium 4.8; Sodium 137   Recent Lipid Panel    Component Value Date/Time   CHOL 135 01/26/2017 0837   TRIG 71 01/26/2017 0837   HDL 47 01/26/2017 0837   CHOLHDL 2.9 01/26/2017 0837   CHOLHDL 2.9 04/27/2016 0826   VLDL 14 04/27/2016 0826   LDLCALC 74 01/26/2017 0837    Physical Exam:    VS:  BP 122/64   Pulse 79   Ht 5\' 11"  (1.803 m)   Wt 279 lb (126.6 kg)   SpO2 98%   BMI 38.91 kg/m     Wt Readings from Last 3 Encounters:  07/17/17 279 lb (126.6 kg)  01/26/17 275 lb 12.8 oz (125.1 kg)  12/20/16 276 lb 12.8 oz (125.6 kg)     GEN:  Well nourished, well developed in no acute distress HEENT: Normal NECK: No JVD; No carotid bruits LYMPHATICS: No lymphadenopathy CARDIAC: RRR, no murmurs, rubs, gallops RESPIRATORY:  Clear to auscultation without rales, wheezing or rhonchi  ABDOMEN: Soft, non-tender, non-distended MUSCULOSKELETAL:  No edema; No deformity  SKIN: Warm and  dry NEUROLOGIC:  Alert and oriented x 3 PSYCHIATRIC:  Normal affect   ASSESSMENT:    1. Coronary artery disease involving native coronary artery of native heart without angina pectoris   2. Essential hypertension   3. Hypercholesterolemia    PLAN:    In order of problems listed above:  1.  ASCAD s/p cath  12/2017showing 20% RCA, 50% OM, 35% mid LCx, 70% D1, 65% mid LAD, 70% distal LAD 70% on medical management.  He has stable chronic angina that has been stable and usually occurs if walking up steep inclines.  His DOE with walking up inclines is stable and has not progressed.  He will continue on ASA 81mg  daily, long acting nitrate daily and statin. I will increase Imdur to 60mg  daily.    2.   HTN - his BP is well controlled on exam today.  He will continue on ACE I.  Creatinine stable at 1.08 earlier this month .  3.   Hyperlipidemia - his LDL goal is < 70.  He will continue on Crestor 20mg  daily.  LDL was 74 a few months ago.  Encouraged him to follow a low fat diet.    4.  Obesity - I have encouraged him to get into a routine exercise program and cut back on carbs and portions.    Medication Adjustments/Labs and Tests Ordered: Current medicines are reviewed at length with the patient today.  Concerns regarding medicines are outlined above.  No orders of the defined types were placed in this encounter.  No orders of the defined types were placed in this encounter.   Signed, Fransico Him, MD  07/17/2017 8:41 AM    [place]

## 2017-07-17 ENCOUNTER — Ambulatory Visit (INDEPENDENT_AMBULATORY_CARE_PROVIDER_SITE_OTHER): Payer: Medicare Other | Admitting: Cardiology

## 2017-07-17 ENCOUNTER — Encounter: Payer: Self-pay | Admitting: Cardiology

## 2017-07-17 VITALS — BP 122/64 | HR 79 | Ht 71.0 in | Wt 279.0 lb

## 2017-07-17 DIAGNOSIS — I10 Essential (primary) hypertension: Secondary | ICD-10-CM | POA: Diagnosis not present

## 2017-07-17 DIAGNOSIS — I2510 Atherosclerotic heart disease of native coronary artery without angina pectoris: Secondary | ICD-10-CM | POA: Diagnosis not present

## 2017-07-17 DIAGNOSIS — E7800 Pure hypercholesterolemia, unspecified: Secondary | ICD-10-CM

## 2017-07-17 MED ORDER — ISOSORBIDE MONONITRATE ER 60 MG PO TB24: 60.0000 mg | ORAL_TABLET | Freq: Every day | ORAL | 3 refills | Status: DC

## 2017-07-17 NOTE — Patient Instructions (Signed)
Medication Instructions:  1) INCREASE IMDUR to 60 mg daily  Labwork: None  Testing/Procedures: None  Follow-Up: Your physician recommends that you schedule a follow-up appointment in 1 MONTH with Dr. Theodosia Blender assistant.  Your physician wants you to follow-up in: 6 months with [name]. You will receive a reminder letter in the mail two months in advance. If you don't receive a letter, please call our office to schedule the follow-up appointment.   Any Other Special Instructions Will Be Listed Below (If Applicable).     If you need a refill on your cardiac medications before your next appointment, please call your pharmacy.

## 2017-07-31 ENCOUNTER — Encounter: Payer: Self-pay | Admitting: Cardiology

## 2017-08-03 ENCOUNTER — Other Ambulatory Visit: Payer: Self-pay | Admitting: Cardiology

## 2017-08-17 ENCOUNTER — Encounter: Payer: Self-pay | Admitting: Cardiology

## 2017-08-17 ENCOUNTER — Ambulatory Visit (INDEPENDENT_AMBULATORY_CARE_PROVIDER_SITE_OTHER): Payer: Medicare Other | Admitting: Cardiology

## 2017-08-17 VITALS — BP 130/88 | HR 71 | Ht 71.0 in | Wt 274.8 lb

## 2017-08-17 DIAGNOSIS — I2510 Atherosclerotic heart disease of native coronary artery without angina pectoris: Secondary | ICD-10-CM

## 2017-08-17 DIAGNOSIS — I25118 Atherosclerotic heart disease of native coronary artery with other forms of angina pectoris: Secondary | ICD-10-CM | POA: Diagnosis not present

## 2017-08-17 DIAGNOSIS — I209 Angina pectoris, unspecified: Secondary | ICD-10-CM

## 2017-08-17 MED ORDER — METOPROLOL TARTRATE 25 MG PO TABS: 12.5000 mg | ORAL_TABLET | Freq: Two times a day (BID) | ORAL | 1 refills | Status: DC

## 2017-08-17 NOTE — Patient Instructions (Addendum)
Medication Instructions:  Your physician has recommended you make the following change in your medication:  1. START Lopressor 12.5 mg twice daily  (if in 2 weeks this dose does not help, and your heart rate and blood pressure are  ok -  you may increase it to 25 mg twice daily)  Labwork: None ordered  Testing/Procedures: None ordered  Follow-Up: Your physician recommends that you schedule a follow-up appointment in: 4 weeks with [name] Henri, PA.  If you need a refill on your cardiac medications before your next appointment, please call your pharmacy.  Thank you for choosing [facility]!!

## 2017-08-17 NOTE — Progress Notes (Signed)
08/17/2017 Christian [name]   1946/02/13  [id number]  Primary Physician Alroy Dust, L.Marlou Sa, MD Primary Cardiologist: [name]    Reason for Visit/CC: 1 Month f/u for Medication Management; f/u for CAD   HPI:  Christian [name] is a 71 y.o. male with a hx of ASCAD with cath 11/2016 showing 20% RCA, 50% OM, 35% mid LCx, 70% D1, 65% mid LAD, 70% distal LAD, on medical management.  He also was noted to have an elevated LVEDP at 20 with EF 45-50% by cath and 50-55% by echo. He has a history ofHTN and dyslipidemia as well.   He is followed by [name]. He presented 1 month ago on 07/17/17 for routine f/u. He noted symptoms of stable angina, which has been chronic. He develops exertional chest tightness and dyspnea waling up steep inclines and with moderate-heavy physical activity. However, this has been stable. No progression. However, [name] elected to further increase the dose of his Imdur to 60 mg daily. He was continued on ASA and statin.   He presents back to clinic today to assess response to therapy. He notes some mild improvement with the increase in Imdur, but admits that he continues to have stable angina, walking up inclines and walking his dog. He denies any symptoms with basic ADLs and walking on flat surfaces. He had a mild HA in the beginning but this resolved. BP has been stable. BP today is 130/88. Meds reviewed. He does not appear to be on a BB. His resting HR is 71 bpm. He has no listed allergies or intolerances to BBs noted in chart. He does not recall ever being on one.     No outpatient prescriptions have been marked as taking for the 08/17/17 encounter (Appointment) with Consuelo Pandy, PA-C.   Allergies  Allergen Reactions  . Atorvastatin Calcium [Atorvastatin] Other (See Comments)    Myalgia  . Simvastatin Other (See Comments)    Myalgia  . Pravastatin Other (See Comments)    Leg Cramps   Past Medical History:  Diagnosis Date  . Arthritis    "joints;  shoulders; left elbow; right thumb" (05/06/2014)  . Cataracts, bilateral    immature  . Constipation    takes Colace and Metamucil daily  . Coronary artery disease 02/2011   cath 11/2016 showing 20% RCA, 50% OM, 35% mid LCx, 70% D1, 65% mid LAD, 70% distal LAD 80% on medical management  . Diastolic dysfunction   . Heart murmur   . Hemorrhoids   . History of bronchitis   . Hypercholesterolemia    takes Crestor daily  . Hypertension   . Joint pain   . Nocturia   . Orthostatic hypotension   . PVC's (premature ventricular contractions)    PVC load 0.2% by Holter 12/2016  . Sciatica    Family History  Problem Relation Age of Onset  . CVA Mother   . CVA Father   . Heart disease Father    Past Surgical History:  Procedure Laterality Date  . ACHILLES TENDON REPAIR Left    "& fixed a spur"  . CARDIAC CATHETERIZATION  2012  . CARDIAC CATHETERIZATION N/A 12/06/2016   Procedure: Left Heart Cath and Coronary Angiography;  Surgeon: Belva Crome, MD;  Location: [facility];  Service: Cardiovascular;  Laterality: N/A;  . COLONOSCOPY    . FLEXIBLE SIGMOIDOSCOPY  X 2  . HAND SURGERY Left    "thumb; cleaned out arthritis"  . HEEL SPUR EXCISION Left   .  KNEE ARTHROSCOPY Right   . SHOULDER SURGERY Right X 2   "cleaned out spurs"  . TOE FUSION Right    great toe; plates and screws  . TONSILLECTOMY  ~ 1950  . TOTAL HIP ARTHROPLASTY Right 09/22/2015   Procedure: TOTAL HIP ARTHROPLASTY;  Surgeon: Garald Balding, MD;  Location: [place];  Service: Orthopedics;  Laterality: Right;  . TOTAL KNEE ARTHROPLASTY Right 05/06/2014  . TOTAL KNEE ARTHROPLASTY Right 05/06/2014   Procedure: RIGHT TOTAL KNEE ARTHROPLASTY;  Surgeon: Garald Balding, MD;  Location: [place] [facility];  Service: Orthopedics;  Laterality: Right;   Social History   Social History  . Marital status: Married    Spouse name: N/A  . Number of children: N/A  . Years of education: N/A   Occupational History  . Not on file.    Social History Main Topics  . Smoking status: Former Smoker    Packs/day: 2.00    Years: 10.00    Types: Cigarettes  . Smokeless tobacco: Never Used     Comment: quit smoking in 1981  . Alcohol use Yes     Comment: "drink a beer sometimes once/month"  . Drug use: No  . Sexual activity: No   Other Topics Concern  . Not on file   Social History Narrative  . No narrative on file     Review of Systems: General: negative for chills, fever, night sweats or weight changes.  Cardiovascular: negative for chest pain, dyspnea on exertion, edema, orthopnea, palpitations, paroxysmal nocturnal dyspnea or shortness of breath Dermatological: negative for rash Respiratory: negative for cough or wheezing Urologic: negative for hematuria Abdominal: negative for nausea, vomiting, diarrhea, bright red blood per rectum, melena, or hematemesis Neurologic: negative for visual changes, syncope, or dizziness All other systems reviewed and are otherwise negative except as noted above.   Physical Exam:  There were no vitals taken for this visit.  General appearance: alert, cooperative and no distress Neck: no carotid bruit and no JVD Lungs: clear to auscultation bilaterally Heart: regular rate and rhythm, S1, S2 normal, no murmur, click, rub or gallop Extremities: extremities normal, atraumatic, no cyanosis or edema Pulses: 2+ and symmetric Skin: Skin color, texture, turgor normal. No rashes or lesions Neurologic: Grossly normal  EKG Not performed -- personally reviewed   ASSESSMENT AND PLAN:   1. CAD w/ chronic stable angina: s/p cath 12/2017showing 20% RCA, 50% OM, 35% mid LCx, 70% D1, 65% mid LAD, 70% distal LAD70% on medical management.  He has stable chronic angina that has been stable and usually occurs if walking up steep inclines.  His DOE with walking up inclines is stable and has not progressed.  [name] increased his Imdur from 30-60 mg at last OV 07/17/17. He reports slight  improvement but still with stable angina walking up inclines and walking his dog. BP is 130/88. HR is 71 bpm. He does not appear to be on a BB. We dicussed this and he in in agreement to add medication. We will start with Lopressor 12.5 mg BID. He will monitor HR and BP at home. If he is still having symptoms on low dose in 2 weeks, he can try increasing to 25 mg BID, if able to tolerate. F/u with APP in 4 weeks. Continue ASA and statin.   2. HTN: controlled on current regimen and tolerating higher dose of Imdur w/o side effects. We will add low dose metoprolol. Pt to monitor BP at home closely and will f/u in 4  weeks.   3. HLD: on statin therapy with Crestor. Last lipid panel showed LDL at 74 mg/dL.    PLAN  Add BB therapy with metoprolol for stable angina. F/u with APP in 4 weeks. F/u with [name] in 6 months.     Domnick Chervenak Ladoris Gene, MHS [facility] 08/17/2017 8:18 AM:

## 2017-08-22 ENCOUNTER — Encounter: Payer: Self-pay | Admitting: Cardiology

## 2017-09-21 ENCOUNTER — Ambulatory Visit (INDEPENDENT_AMBULATORY_CARE_PROVIDER_SITE_OTHER): Payer: Medicare Other | Admitting: Cardiology

## 2017-09-21 ENCOUNTER — Encounter: Payer: Self-pay | Admitting: Cardiology

## 2017-09-21 VITALS — BP 92/62 | HR 81 | Resp 16 | Ht 71.0 in | Wt 276.0 lb

## 2017-09-21 DIAGNOSIS — I209 Angina pectoris, unspecified: Secondary | ICD-10-CM

## 2017-09-21 DIAGNOSIS — I208 Other forms of angina pectoris: Secondary | ICD-10-CM

## 2017-09-21 NOTE — Patient Instructions (Addendum)
Medication Instructions:   STOP TAKING METOPROLOL   If you need a refill on your cardiac medications before your next appointment, please call your pharmacy.  Labwork: NONE ORDERED  TODAY    Testing/Procedures:  NONE ORDERED  TODAY    Follow-Up: AS SCHEDULED  WITH [name]    Any Other Special Instructions Will Be Listed Below (If Applicable).

## 2017-09-21 NOTE — Progress Notes (Signed)
09/21/2017 Christian [name]   1946/02/17  [id number]  Primary Physician Christian [name], L.Christian Sa, MD Primary Cardiologist: Dr. Radford [name]  Reason for Visit/CC: F/u for CAD and Medication Monitoring  HPI:  Christian Wombles Wrightis a 71 y.o.malewith a hx of ASCAD withcath 11/2016 showing 20% RCA, 50% OM, 35% mid LCx, 70% D1, 65% mid LAD, 70% distal LAD, on medical management. He also was noted to have an elevated LVEDP at 20 with EF 45-50% by cath and 50-55% by echo. He has a history ofHTN and dyslipidemia as well.   He is followed by Dr. Radford [name]. He presented 2 months ago on 07/17/17 for routine f/u. He noted symptoms of stable angina, which has been chronic. He develops exertional chest tightness and dyspnea waling up steep inclines and with moderate-heavy physical activity. However, this has been stable. No progression. However, Dr. Radford [name] elected to further increase the dose of his Imdur to 60 mg daily. He was continued on ASA and statin.   I saw him back for f/u 1 month later on 08/17/17. He noted mild improvement with the increase in Imdur, but admited that he continued to have stable angina, walking up inclines and walking his dog. He denied any symptoms with basic ADLs and walking on flat surfaces. He had a mild HA in the beginning but this resolved. His BP and HR were stable. I added a BB. We started 12.5 mg BID of metoprolol with plans to further increase if tolerating ok.   He presents back again for f/u to assess response to medication change. He has not tolerated metoprolol. He feels very tired and lightheaded. His BP has been borderline. Systolic BP in the 19E. He continues to have the same stable symptoms. He only gets chest discomfort with moderate to heavy activity that resolves quickly after rest. No unstable symptoms. He denies resting CP. No CP with basic ADLs.  He has SL NTG at home for emergency use but has not had to use them.     Current Meds  Medication Sig  . amoxicillin (AMOXIL)  500 MG tablet Take 4 tablets 2 hours prior to any dental procedure  . aspirin 81 MG tablet Take 81 mg by mouth daily.  [name] Kitchen docusate sodium (STOOL SOFTENER) 100 MG capsule Take 200 mg by mouth daily.  . furosemide (LASIX) 20 MG tablet Take 1 tablet (20 mg total) by mouth every Monday, Wednesday, and Friday.  . isosorbide mononitrate (IMDUR) 60 MG 24 hr tablet Take 1 tablet (60 mg total) by mouth daily.  [name] Kitchen lisinopril (PRINIVIL,ZESTRIL) 20 MG tablet Take 1 tablet (20 mg total) by mouth daily.  . nitroGLYCERIN (NITROSTAT) 0.4 MG SL tablet PLACE 1 TABLET UNDER THE TONGUE EVERY 5 MINUTES AS NEEDED FOR CHEST PAIN  . rosuvastatin (CRESTOR) 20 MG tablet Take 1 tablet (20 mg total) by mouth daily.  . [DISCONTINUED] metoprolol tartrate (LOPRESSOR) 25 MG tablet Take 0.5 tablets (12.5 mg total) by mouth 2 (two) times daily.   Allergies  Allergen Reactions  . Atorvastatin Calcium [Atorvastatin] Other (See Comments)    Myalgia  . Simvastatin Other (See Comments)    Myalgia  . Pravastatin Other (See Comments)    Leg Cramps   Past Medical History:  Diagnosis Date  . Arthritis    "joints; shoulders; left elbow; right thumb" (05/06/2014)  . Cataracts, bilateral    immature  . Constipation    takes Colace and Metamucil daily  . Coronary artery disease 02/2011   cath 11/2016 showing 20% RCA,  50% OM, 35% mid LCx, 70% D1, 65% mid LAD, 70% distal LAD 80% on medical management  . Diastolic dysfunction   . Heart murmur   . Hemorrhoids   . History of bronchitis   . Hypercholesterolemia    takes Crestor daily  . Hypertension   . Joint pain   . Nocturia   . Orthostatic hypotension   . PVC's (premature ventricular contractions)    PVC load 0.2% by Holter 12/2016  . Sciatica    Family History  Problem Relation Age of Onset  . CVA Mother   . CVA Father   . Heart disease Father    Past Surgical History:  Procedure Laterality Date  . ACHILLES TENDON REPAIR Left    "& fixed a spur"  . CARDIAC  CATHETERIZATION  2012  . CARDIAC CATHETERIZATION N/A 12/06/2016   Procedure: Left Heart Cath and Coronary Angiography;  Surgeon: Belva Crome, MD;  Location: [facility];  Service: Cardiovascular;  Laterality: N/A;  . COLONOSCOPY    . FLEXIBLE SIGMOIDOSCOPY  X 2  . HAND SURGERY Left    "thumb; cleaned out arthritis"  . HEEL SPUR EXCISION Left   . KNEE ARTHROSCOPY Right   . SHOULDER SURGERY Right X 2   "cleaned out spurs"  . TOE FUSION Right    great toe; plates and screws  . TONSILLECTOMY  ~ 1950  . TOTAL HIP ARTHROPLASTY Right 09/22/2015   Procedure: TOTAL HIP ARTHROPLASTY;  Surgeon: Garald Balding, MD;  Location: [facility];  Service: Orthopedics;  Laterality: Right;  . TOTAL KNEE ARTHROPLASTY Right 05/06/2014  . TOTAL KNEE ARTHROPLASTY Right 05/06/2014   Procedure: RIGHT TOTAL KNEE ARTHROPLASTY;  Surgeon: Garald Balding, MD;  Location: [facility];  Service: Orthopedics;  Laterality: Right;   Social History   Social History  . Marital status: Married    Spouse name: N/A  . Number of children: N/A  . Years of education: N/A   Occupational History  . Not on file.   Social History Main Topics  . Smoking status: Former Smoker    Packs/day: 2.00    Years: 10.00    Types: Cigarettes  . Smokeless tobacco: Never Used     Comment: quit smoking in 1981  . Alcohol use Yes     Comment: "drink a beer sometimes once/month"  . Drug use: No  . Sexual activity: No   Other Topics Concern  . Not on file   Social History Narrative  . No narrative on file     Review of Systems: General: negative for chills, fever, night sweats or weight changes.  Cardiovascular: negative for chest pain, dyspnea on exertion, edema, orthopnea, palpitations, paroxysmal nocturnal dyspnea or shortness of breath Dermatological: negative for rash Respiratory: negative for cough or wheezing Urologic: negative for hematuria Abdominal: negative for nausea, vomiting, diarrhea, bright red blood per rectum,  melena, or hematemesis Neurologic: negative for visual changes, syncope, or dizziness All other systems reviewed and are otherwise negative except as noted above.   Physical Exam:  Blood pressure 92/62, pulse 81, resp. rate 16, height 5\' 11"  (1.803 m), weight 276 lb (125.2 kg), SpO2 97 %.  General appearance: alert, cooperative and no distress Neck: no carotid bruit and no JVD Lungs: clear to auscultation bilaterally Heart: regular rate and rhythm, S1, S2 normal, no murmur, click, rub or gallop Extremities: extremities normal, atraumatic, no cyanosis or edema Pulses: 2+ and symmetric Skin: Skin color, texture, turgor normal. No rashes or lesions  Neurologic: Grossly normal  EKG not performed -- personally reviewed   ASSESSMENT AND PLAN:   1. CAD w/ chronic stable angina: s/p cath 12/2017showing 20% RCA, 50% OM, 35% mid LCx, 70% D1, 65% mid LAD, 70% distal LAD70% on medical management. He has stable chronic angina that has been stable and usually occurs if walking up steep inclines.His DOE with walking up inclines is stable and has not progressed. He has tolerated higher doses of Imdur, however he has not tolerated low dose metoprolol, which was last added. He has had chronic fatigue and lightheadedness since starting it. His SBPs are in the 90s, which is much lower than his baseline. We will stop metoprolol. Continue ASA, Imdur and statin.   2. HTN: soft after addition of metoprolol. We will stop metoprolol given side effects and soft BP.   3. HLD: on statin therapy with Crestor. Last lipid panel showed LDL at 74 mg/dL.    Follow-Up w/ Dr .Christian [name] in 6 months   Christian [name], [facility] 09/21/2017 8:33 AM

## 2017-11-02 DIAGNOSIS — H2513 Age-related nuclear cataract, bilateral: Secondary | ICD-10-CM | POA: Diagnosis not present

## 2017-11-02 DIAGNOSIS — H40013 Open angle with borderline findings, low risk, bilateral: Secondary | ICD-10-CM | POA: Diagnosis not present

## 2017-11-02 DIAGNOSIS — H35033 Hypertensive retinopathy, bilateral: Secondary | ICD-10-CM | POA: Diagnosis not present

## 2017-11-02 DIAGNOSIS — H47323 Drusen of optic disc, bilateral: Secondary | ICD-10-CM | POA: Diagnosis not present

## 2017-11-07 ENCOUNTER — Encounter (INDEPENDENT_AMBULATORY_CARE_PROVIDER_SITE_OTHER): Payer: Self-pay | Admitting: Orthopaedic Surgery

## 2017-11-07 ENCOUNTER — Ambulatory Visit (INDEPENDENT_AMBULATORY_CARE_PROVIDER_SITE_OTHER): Payer: Medicare Other | Admitting: Orthopaedic Surgery

## 2017-11-07 ENCOUNTER — Ambulatory Visit (INDEPENDENT_AMBULATORY_CARE_PROVIDER_SITE_OTHER): Payer: Medicare Other

## 2017-11-07 VITALS — BP 109/61 | HR 64 | Resp 12 | Ht 71.0 in | Wt 268.0 lb

## 2017-11-07 DIAGNOSIS — M7662 Achilles tendinitis, left leg: Secondary | ICD-10-CM | POA: Diagnosis not present

## 2017-11-07 DIAGNOSIS — M7660 Achilles tendinitis, unspecified leg: Secondary | ICD-10-CM | POA: Diagnosis not present

## 2017-11-07 DIAGNOSIS — I208 Other forms of angina pectoris: Secondary | ICD-10-CM | POA: Diagnosis not present

## 2017-11-07 MED ORDER — DICLOFENAC SODIUM 1 % TD GEL: 2.0000 g | Freq: Four times a day (QID) | TRANSDERMAL | 1 refills | Status: DC

## 2017-11-07 NOTE — Progress Notes (Signed)
Office Visit Note   Patient: Christian [name]           Date of Birth: [birth year]           MRN: [id number] Visit Date: 11/07/2017              Requested by: Christian [name], L.Christian [name], Christian [name], Christian [name] [id number] PCP: Christian [name], L.Christian Sa, MD   Assessment & Plan: Visit Diagnoses:  1. Tendonitis, Achilles, left   2. Pain in Achilles tendon     Plan:  #1: Equalizer boot with an arch support and elevation of the heel with felt pads #2: Voltaren gel that he'll use 3-4 times a day  Follow-Up Instructions: Return in about 3 weeks (around 11/28/2017).   Orders:  Orders Placed This Encounter  Procedures  . XR Ankle Complete Left   Meds ordered this encounter  Medications  . diclofenac sodium (VOLTAREN) 1 % GEL    Sig: Apply 2-4 g 4 (four) times daily topically.    Dispense:  5 Tube    Refill:  1    Order Specific Question:   Supervising Provider    Answer:   Christian [name] [[id number]]      Procedures: No procedures performed   Clinical Data: No additional findings.   Subjective: Chief Complaint  Patient presents with  . Left Ankle - Pain, Edema    Christian [name] is a 6 y o Left ankle pain, pt thinks possible achilles tendon torn. Hx of same on left ankle 4 yrs ago.    HPI  Christian [name] is a very pleasant 71 year old white male who is seen today for evaluation of his left ankle. His pain is mainly more in the Achilles tendon area above the surgical incision where he previously had a calcification excision and a Haglund deformity removal and repair for chronic Achilles tendinitis on 08/20/2009. He states is really not having any problems with this except for about 4 years ago when he had some tendinitis. This had some improvement on its own. He comes in today now concerned that the Achilles is more painful and considers possible tear. He does not remember specific injury.  Review of Systems  Constitutional: Negative for fatigue.  HENT: Negative for hearing  loss.   Respiratory: Negative for apnea, chest tightness and shortness of breath.   Cardiovascular: Negative for chest pain, palpitations and leg swelling.  Gastrointestinal: Negative for blood in stool, constipation and diarrhea.  Genitourinary: Negative for difficulty urinating.  Musculoskeletal: Negative for arthralgias, back pain, joint swelling, myalgias, neck pain and neck stiffness.  Neurological: Negative for weakness, numbness and headaches.  Hematological: Does not bruise/bleed easily.  Psychiatric/Behavioral: Negative for sleep disturbance. The patient is not nervous/anxious.      Objective: Vital Signs: BP 109/61   Pulse 64   Resp 12   Ht 5\' 11"  (1.803 m)   Wt 268 lb (121.6 kg)   BMI 37.38 kg/m   Physical Exam  Constitutional: He is oriented to person, place, and time. He appears well-developed and well-nourished.  HENT:  Head: Normocephalic and atraumatic.  Eyes: EOM are normal. Pupils are equal, round, and reactive to light.  Pulmonary/Chest: Effort normal.  Neurological: He is alert and oriented to person, place, and time.  Skin: Skin is warm and dry.  Psychiatric: He has a normal mood and affect. His behavior is normal. Judgment and thought content normal.    Ortho Exam  Today he has a well-healed surgical incision overlying  the distal posterior ankle. He has with the knee in full extension about 15 of dorsiflexion. Right above the incision there is little bit of bulbous area. The subcutaneous tender but further proximal it is. I do not feel a palpable gap. No warmth or erythema. Excellent strength with plantar flexion.  Specialty Comments:  No specialty comments available.  Imaging: Xr Ankle Complete Left  Result Date: 11/07/2017 Three-view x-ray of the left ankle reveals the tip talar degenerative changes. He does have spurring on the medial malleolus both medially and the inferiorly. He has calcification at the distal fibula. Some cystic changes in the  distal fibula. The lateral talus has some spurring noted which apparently is starting to erode the distal tibia laterally. He does have narrowing of the tip talar joint medially. Lateral ankle reveals it foot the DJD with marked narrowing of the talonavicular joint and anterior spurring of the talus. Remaining joints also in the midfoot appeared to be narrowed. Some intermittent calcification diffusely about the ankle. He does have a calcaneal spur noted.    PMFS History: Patient Active Problem List   Diagnosis Date Noted  . PVC's (premature ventricular contractions)   . Dyspnea 12/06/2016  . Osteoarthritis of right hip 09/22/2015  . S/P total hip arthroplasty 09/22/2015  . Obesity 05/08/2014  . Osteoarthritis of knee 05/08/2014  . S/P total knee replacement using cement 05/06/2014  . Knee pain 04/08/2014  . Preoperative clearance 04/08/2014  . Coronary artery disease   . Diastolic dysfunction   . Hypertension   . Orthostatic hypotension   . Hypercholesterolemia    Past Medical History:  Diagnosis Date  . Arthritis    "joints; shoulders; left elbow; right thumb" (05/06/2014)  . Cataracts, bilateral    immature  . Constipation    takes Colace and Metamucil daily  . Coronary artery disease 02/2011   cath 11/2016 showing 20% RCA, 50% OM, 35% mid LCx, 70% D1, 65% mid LAD, 70% distal LAD 80% on medical management  . Diastolic dysfunction   . Heart murmur   . Hemorrhoids   . History of bronchitis   . Hypercholesterolemia    takes Crestor daily  . Hypertension   . Joint pain   . Nocturia   . Orthostatic hypotension   . PVC's (premature ventricular contractions)    PVC load 0.2% by Holter 12/2016  . Sciatica     Family History  Problem Relation Age of Onset  . CVA Mother   . CVA Father   . Heart disease Father     Past Surgical History:  Procedure Laterality Date  . ACHILLES TENDON REPAIR Left    "& fixed a spur"  . CARDIAC CATHETERIZATION  2012  . COLONOSCOPY    .  FLEXIBLE SIGMOIDOSCOPY  X 2  . HAND SURGERY Left    "thumb; cleaned out arthritis"  . HEEL SPUR EXCISION Left   . KNEE ARTHROSCOPY Right   . SHOULDER SURGERY Right X 2   "cleaned out spurs"  . TOE FUSION Right    great toe; plates and screws  . TONSILLECTOMY  ~ 1950  . TOTAL KNEE ARTHROPLASTY Right 05/06/2014   Social History   Occupational History  . Not on file  Tobacco Use  . Smoking status: Former Smoker    Packs/day: 2.00    Years: 10.00    Pack years: 20.00    Types: Cigarettes  . Smokeless tobacco: Never Used  . Tobacco comment: quit smoking in 1981  Substance and  Sexual Activity  . Alcohol use: Yes    Comment: "drink a beer sometimes once/month"  . Drug use: No  . Sexual activity: No

## 2017-11-10 ENCOUNTER — Ambulatory Visit (INDEPENDENT_AMBULATORY_CARE_PROVIDER_SITE_OTHER): Payer: Medicare Other | Admitting: Orthopaedic Surgery

## 2017-11-13 ENCOUNTER — Telehealth (INDEPENDENT_AMBULATORY_CARE_PROVIDER_SITE_OTHER): Payer: Self-pay | Admitting: Orthopaedic Surgery

## 2017-11-13 ENCOUNTER — Encounter (INDEPENDENT_AMBULATORY_CARE_PROVIDER_SITE_OTHER): Payer: Self-pay | Admitting: Orthopaedic Surgery

## 2017-11-13 ENCOUNTER — Ambulatory Visit (INDEPENDENT_AMBULATORY_CARE_PROVIDER_SITE_OTHER): Payer: Medicare Other | Admitting: Orthopaedic Surgery

## 2017-11-13 VITALS — BP 99/71 | HR 89 | Resp 14 | Ht 71.0 in | Wt 268.0 lb

## 2017-11-13 DIAGNOSIS — M25562 Pain in left knee: Secondary | ICD-10-CM

## 2017-11-13 MED ORDER — LIDOCAINE HCL 1 % IJ SOLN
2.0000 mL | INTRAMUSCULAR | Status: AC | PRN
  Administered 2017-11-13: 2 mL

## 2017-11-13 MED ORDER — METHYLPREDNISOLONE ACETATE 40 MG/ML IJ SUSP
80.0000 mg | INTRAMUSCULAR | Status: AC | PRN
  Administered 2017-11-13: 80 mg

## 2017-11-13 MED ORDER — BUPIVACAINE HCL 0.5 % IJ SOLN
2.0000 mL | INTRAMUSCULAR | Status: AC | PRN
  Administered 2017-11-13: 2 mL via INTRA_ARTICULAR

## 2017-11-13 NOTE — Telephone Encounter (Signed)
Made in error

## 2017-11-13 NOTE — Progress Notes (Signed)
Office Visit Note   Patient: Christian [name]           Date of Birth: Nov 06, 1946           MRN: [id number] Visit Date: 11/13/2017              Requested by: Alroy Dust, L.Marlou Sa, [facility] [employer] [place], [place] [address] PCP: Alroy Dust, L.Marlou Sa, MD   Assessment & Plan: Visit Diagnoses: Chondromalacia left patella  Plan: Painful left knee consistent with chondromalacia patella. Will locally injected along the lateral patella and reevaluate in several weeks. Also being followed for the Achilles tendinitis left ankle and much better in the equalizer boot   Orders:  No orders of the defined types were placed in this encounter.  No orders of the defined types were placed in this encounter.     Procedures: Large Joint Inj: L knee on 11/13/2017 9:55 AM Indications: pain and diagnostic evaluation Details: 25 G 1.5 in needle, anteromedial approach  Arthrogram: No  Medications: 2 mL lidocaine 1 %; 2 mL bupivacaine 0.5 %; 80 mg methylPREDNISolone acetate 40 MG/ML Procedure, treatment alternatives, risks and benefits explained, specific risks discussed. Consent was given by the patient. Patient was prepped and draped in the usual sterile fashion.       Clinical Data: No additional findings.   Subjective: Chief Complaint  Patient presents with  . Right Foot - Tendonitis    [name] is a 71 y o here for F/U of left achilles tendon pain.  He relates since he has been wearing the equalizer boot, tendonitis is much better. He went to grocery 3 days ago and his gait has been off he felt a "POP" in left knee.   [name] is been followed for the Achilles tendinitis left ankle. We saw him last week and started immobilization in an equalizer boot. He is feeling much better. Yesterday at church she had made a "wrong step" and experienced a "pop" along the lateral aspect of his left knee. He had some leftover oxycodone that he took last night and is feeling a little bit  better.  HPI  Review of Systems  Constitutional: Negative for fatigue.  HENT: Negative for hearing loss.   Respiratory: Negative for apnea, chest tightness and shortness of breath.   Cardiovascular: Negative for chest pain, palpitations and leg swelling.  Gastrointestinal: Negative for blood in stool, constipation and diarrhea.  Genitourinary: Negative for difficulty urinating.  Musculoskeletal: Negative for arthralgias, back pain, joint swelling, myalgias, neck pain and neck stiffness.  Neurological: Negative for weakness, numbness and headaches.  Hematological: Does not bruise/bleed easily.  Psychiatric/Behavioral: Positive for sleep disturbance. The patient is not nervous/anxious.      Objective: Vital Signs: There were no vitals taken for this visit.  Physical Exam  Ortho Exam awake alert and oriented 3. Comfortable sitting. Left knee without effusion. Localized tenderness along the lateral patella with patellar crepitation. No instability. No medial or lateral joint pain. Full extension and flexion. Has pain with weightbearing along the lateral patella. The distal edema. Still has some tenderness along the left Achilles but without mass formation. It appears to be intact  Specialty Comments:  No specialty comments available.  Imaging: No results found.   PMFS History: Patient Active Problem List   Diagnosis Date Noted  . PVC's (premature ventricular contractions)   . Dyspnea 12/06/2016  . Osteoarthritis of right hip 09/22/2015  . S/P total hip arthroplasty 09/22/2015  . Obesity 05/08/2014  .  Osteoarthritis of knee 05/08/2014  . S/P total knee replacement using cement 05/06/2014  . Knee pain 04/08/2014  . Preoperative clearance 04/08/2014  . Coronary artery disease   . Diastolic dysfunction   . Hypertension   . Orthostatic hypotension   . Hypercholesterolemia    Past Medical History:  Diagnosis Date  . Arthritis    "joints; shoulders; left elbow; right thumb"  (05/06/2014)  . Cataracts, bilateral    immature  . Constipation    takes Colace and Metamucil daily  . Coronary artery disease 02/2011   cath 11/2016 showing 20% RCA, 50% OM, 35% mid LCx, 70% D1, 65% mid LAD, 70% distal LAD 80% on medical management  . Diastolic dysfunction   . Heart murmur   . Hemorrhoids   . History of bronchitis   . Hypercholesterolemia    takes Crestor daily  . Hypertension   . Joint pain   . Nocturia   . Orthostatic hypotension   . PVC's (premature ventricular contractions)    PVC load 0.2% by Holter 12/2016  . Sciatica     Family History  Problem Relation Age of Onset  . CVA Mother   . CVA Father   . Heart disease Father     Past Surgical History:  Procedure Laterality Date  . ACHILLES TENDON REPAIR Left    "& fixed a spur"  . CARDIAC CATHETERIZATION  2012  . COLONOSCOPY    . FLEXIBLE SIGMOIDOSCOPY  X 2  . HAND SURGERY Left    "thumb; cleaned out arthritis"  . HEEL SPUR EXCISION Left   . KNEE ARTHROSCOPY Right   . Left Heart Cath and Coronary Angiography N/A 12/06/2016   Performed by Belva Crome, MD at [facility]  . RIGHT TOTAL KNEE ARTHROPLASTY Right 05/06/2014   Performed by Garald Balding, MD at [facility] Right X 2   "cleaned out spurs"  . TOE FUSION Right    great toe; plates and screws  . TONSILLECTOMY  ~ 1950  . TOTAL HIP ARTHROPLASTY Right 09/22/2015   Performed by Garald Balding, MD at [facility]  . TOTAL KNEE ARTHROPLASTY Right 05/06/2014   Social History   Occupational History  . Not on file  Tobacco Use  . Smoking status: Former Smoker    Packs/day: 2.00    Years: 10.00    Pack years: 20.00    Types: Cigarettes  . Smokeless tobacco: Never Used  . Tobacco comment: quit smoking in 1981  Substance and Sexual Activity  . Alcohol use: Yes    Comment: "drink a beer sometimes once/month"  . Drug use: No  . Sexual activity: No

## 2017-11-24 ENCOUNTER — Ambulatory Visit (INDEPENDENT_AMBULATORY_CARE_PROVIDER_SITE_OTHER): Payer: Medicare Other | Admitting: Orthopaedic Surgery

## 2017-11-24 ENCOUNTER — Encounter (INDEPENDENT_AMBULATORY_CARE_PROVIDER_SITE_OTHER): Payer: Self-pay | Admitting: Orthopaedic Surgery

## 2017-11-24 ENCOUNTER — Ambulatory Visit (INDEPENDENT_AMBULATORY_CARE_PROVIDER_SITE_OTHER): Payer: Medicare Other

## 2017-11-24 VITALS — BP 101/66 | HR 80 | Resp 16 | Ht 71.0 in | Wt 268.0 lb

## 2017-11-24 DIAGNOSIS — I208 Other forms of angina pectoris: Secondary | ICD-10-CM | POA: Diagnosis not present

## 2017-11-24 DIAGNOSIS — M25562 Pain in left knee: Secondary | ICD-10-CM

## 2017-11-24 NOTE — Progress Notes (Signed)
Office Visit Note   Patient: Christian [name]           Date of Birth: 06/12/1946           MRN: [id number] Visit Date: 11/24/2017              Requested by: Alroy Dust, L.Marlou Sa, [place] [address] [employer] [address] [place], [place] [address] PCP: Alroy Dust, L.Marlou Sa, MD   Assessment & Plan: Visit Diagnoses:  1. Acute pain of left knee     Plan: Symptoms and clinical exam consistent with osteoarthritis left knee report response to cortisone injection within the last week or 2. Long discussion regarding treatment options over time including NSAIDs. Has had prior right total knee replacement and hip replacement related to osteoarthritis. Would also consider MRI scan of left knee. Christian [name] wanted to hold on that. Comfortable with diagnosis and treatment options of knee arthritis  Follow-Up Instructions: No Follow-up on file.   Orders:  Orders Placed This Encounter  Procedures  . XR KNEE 3 VIEW LEFT   No orders of the defined types were placed in this encounter.     Procedures: No procedures performed   Clinical Data: No additional findings.   Subjective: Chief Complaint  Patient presents with  . Left Ankle - Follow-up    Christian [name] is a 71 y o here for a F/U of Left ankle pain. Currently wearing a boot but wants to discontinue. Doing well no pain  . Left Knee - Pain, Weakness    Pt hurt his left knee at church 2 weeks ago when he was standing up and pain felt a burning sensation lateral knee pain. Pt taking hydrocodone from a previous surgery, does not help.    HPI  Review of Systems  Constitutional: Negative for fatigue.  HENT: Negative for hearing loss.   Respiratory: Negative for apnea, chest tightness and shortness of breath.   Cardiovascular: Negative for chest pain, palpitations and leg swelling.  Gastrointestinal: Negative for blood in stool, constipation and diarrhea.  Genitourinary: Negative for difficulty urinating.  Musculoskeletal: Negative for arthralgias, back  pain, joint swelling, myalgias, neck pain and neck stiffness.  Neurological: Negative for weakness, numbness and headaches.  Hematological: Does not bruise/bleed easily.  Psychiatric/Behavioral: Negative for sleep disturbance. The patient is not nervous/anxious.      Objective: Vital Signs: BP 101/66   Pulse 80   Resp 16   Ht 5\' 11"  (1.803 m)   Wt 268 lb (121.6 kg)   BMI 37.38 kg/m   Physical Exam  Ortho Exam awake alert and oriented 3. Comfortable sitting. Does walk with a limp referable to his left knee. Left knee was not hot red or warm. Minimal effusion. Predominantly lateral joint pain without crepitation. Full extension and flexion. No popliteal pain or discomfort to distal edema. No pain with range of motion of either hip. Straight leg raise negative  Specialty Comments:  No specialty comments available.  Imaging: No results found.   PMFS History: Patient Active Problem List   Diagnosis Date Noted  . PVC's (premature ventricular contractions)   . Dyspnea 12/06/2016  . Osteoarthritis of right hip 09/22/2015  . S/P total hip arthroplasty 09/22/2015  . Obesity 05/08/2014  . Osteoarthritis of knee 05/08/2014  . S/P total knee replacement using cement 05/06/2014  . Knee pain 04/08/2014  . Preoperative clearance 04/08/2014  . Coronary artery disease   . Diastolic dysfunction   . Hypertension   . Orthostatic hypotension   . Hypercholesterolemia  Past Medical History:  Diagnosis Date  . Arthritis    "joints; shoulders; left elbow; right thumb" (05/06/2014)  . Cataracts, bilateral    immature  . Constipation    takes Colace and Metamucil daily  . Coronary artery disease 02/2011   cath 11/2016 showing 20% RCA, 50% OM, 35% mid LCx, 70% D1, 65% mid LAD, 70% distal LAD 80% on medical management  . Diastolic dysfunction   . Heart murmur   . Hemorrhoids   . History of bronchitis   . Hypercholesterolemia    takes Crestor daily  . Hypertension   . Joint pain   .  Nocturia   . Orthostatic hypotension   . PVC's (premature ventricular contractions)    PVC load 0.2% by Holter 12/2016  . Sciatica     Family History  Problem Relation Age of Onset  . CVA Mother   . CVA Father   . Heart disease Father     Past Surgical History:  Procedure Laterality Date  . ACHILLES TENDON REPAIR Left    "& fixed a spur"  . CARDIAC CATHETERIZATION  2012  . CARDIAC CATHETERIZATION N/A 12/06/2016   Procedure: Left Heart Cath and Coronary Angiography;  Surgeon: Belva Crome, MD;  Location: [facility];  Service: Cardiovascular;  Laterality: N/A;  . COLONOSCOPY    . FLEXIBLE SIGMOIDOSCOPY  X 2  . HAND SURGERY Left    "thumb; cleaned out arthritis"  . HEEL SPUR EXCISION Left   . KNEE ARTHROSCOPY Right   . SHOULDER SURGERY Right X 2   "cleaned out spurs"  . TOE FUSION Right    great toe; plates and screws  . TONSILLECTOMY  ~ 1950  . TOTAL HIP ARTHROPLASTY Right 09/22/2015   Procedure: TOTAL HIP ARTHROPLASTY;  Surgeon: Garald Balding, MD;  Location: [facility];  Service: Orthopedics;  Laterality: Right;  . TOTAL KNEE ARTHROPLASTY Right 05/06/2014  . TOTAL KNEE ARTHROPLASTY Right 05/06/2014   Procedure: RIGHT TOTAL KNEE ARTHROPLASTY;  Surgeon: Garald Balding, MD;  Location: [facility];  Service: Orthopedics;  Laterality: Right;   Social History   Occupational History  . Not on file  Tobacco Use  . Smoking status: Former Smoker    Packs/day: 2.00    Years: 10.00    Pack years: 20.00    Types: Cigarettes  . Smokeless tobacco: Never Used  . Tobacco comment: quit smoking in 1981  Substance and Sexual Activity  . Alcohol use: Yes    Comment: "drink a beer sometimes once/month"  . Drug use: No  . Sexual activity: No

## 2017-11-30 ENCOUNTER — Other Ambulatory Visit (INDEPENDENT_AMBULATORY_CARE_PROVIDER_SITE_OTHER): Payer: Self-pay

## 2017-11-30 ENCOUNTER — Encounter (INDEPENDENT_AMBULATORY_CARE_PROVIDER_SITE_OTHER): Payer: Self-pay | Admitting: Orthopaedic Surgery

## 2017-11-30 DIAGNOSIS — G8929 Other chronic pain: Secondary | ICD-10-CM

## 2017-11-30 DIAGNOSIS — M25562 Pain in left knee: Principal | ICD-10-CM

## 2017-12-01 ENCOUNTER — Ambulatory Visit (INDEPENDENT_AMBULATORY_CARE_PROVIDER_SITE_OTHER): Payer: Medicare Other | Admitting: Orthopaedic Surgery

## 2017-12-08 ENCOUNTER — Telehealth: Payer: Self-pay

## 2017-12-08 NOTE — Telephone Encounter (Signed)
Patient called about having a MRI done.

## 2017-12-17 ENCOUNTER — Other Ambulatory Visit: Payer: Self-pay | Admitting: Physician Assistant

## 2017-12-17 DIAGNOSIS — E7800 Pure hypercholesterolemia, unspecified: Secondary | ICD-10-CM

## 2017-12-18 ENCOUNTER — Ambulatory Visit
Admission: RE | Admit: 2017-12-18 | Discharge: 2017-12-18 | Disposition: A | Discharge status: Home / Self Care | Payer: Medicare Other | Source: Ambulatory Visit | Attending: Orthopaedic Surgery | Admitting: Orthopaedic Surgery

## 2017-12-18 DIAGNOSIS — G8929 Other chronic pain: Secondary | ICD-10-CM

## 2017-12-18 DIAGNOSIS — M25562 Pain in left knee: Secondary | ICD-10-CM | POA: Diagnosis not present

## 2017-12-29 ENCOUNTER — Encounter (INDEPENDENT_AMBULATORY_CARE_PROVIDER_SITE_OTHER): Payer: Self-pay | Admitting: Orthopaedic Surgery

## 2017-12-29 ENCOUNTER — Ambulatory Visit (INDEPENDENT_AMBULATORY_CARE_PROVIDER_SITE_OTHER): Payer: Medicare Other | Admitting: Orthopaedic Surgery

## 2017-12-29 VITALS — BP 155/88 | HR 69 | Resp 16 | Ht 72.0 in | Wt 268.0 lb

## 2017-12-29 DIAGNOSIS — G8929 Other chronic pain: Secondary | ICD-10-CM | POA: Diagnosis not present

## 2017-12-29 DIAGNOSIS — M25562 Pain in left knee: Secondary | ICD-10-CM

## 2017-12-29 MED ORDER — TRAMADOL HCL 50 MG PO TABS: 50.0000 mg | ORAL_TABLET | Freq: Two times a day (BID) | ORAL | 0 refills | Status: DC | PRN

## 2017-12-29 MED ORDER — TRAMADOL HCL 50 MG PO TABS: ORAL_TABLET | ORAL | 0 refills | Status: DC

## 2017-12-29 NOTE — Progress Notes (Signed)
Office Visit Note   Patient: Christian [name]           Date of Birth: 1945-12-31           MRN: [id number] Visit Date: 12/29/2017              Requested by: Christian [name], L.Christian [name], [place] [employer] [address] [place], [place] [address] PCP: Christian [name], L.Christian Sa, MD   Assessment & Plan: Visit Diagnoses:  1. Chronic pain of left knee     Plan: MRI scan left knee demonstrates a tear of the posterior horn of the medial meniscus. He also has tricompartmental degenerative changes predominantly in the lateral compartment. Christian [name] relates she wasn't having any significant problems with his left knee until he was getting up from from the seat at church recently. We've had a long discussion regarding the MRI scan results.  Will give him prescription for tramadol as he is having trouble sleeping .He is taking up to 12 Advil a day. Also discussed different treatment options including arthroscopy and total knee replacement. He would like to proceed with a knee arthroscopy knowing the limitations with his degenerative changes. We'll schedule at his convenience  Follow-Up Instructions: Return will schedule left knee surgery.   Orders:  No orders of the defined types were placed in this encounter.  Meds ordered this encounter  Medications  . DISCONTD: traMADol (ULTRAM) 50 MG tablet    Sig: Take 1 tablet (50 mg total) by mouth every 12 (twelve) hours as needed.    Dispense:  30 tablet    Refill:  0  . traMADol (ULTRAM) 50 MG tablet    Sig: Take 1-2 tablets(100mg  total) by mouth at bedtime.    Dispense:  30 tablet    Refill:  0      Procedures: No procedures performed   Clinical Data: No additional findings.   Subjective: Chief Complaint  Patient presents with  . Left Knee - Results    MRI results of left knee  Christian [name] has been having trouble with his left knee since she rose from a seat at church recently. MRI scan results are as above. He is having pain both medially and laterally  but no significant problem until that one event  HPI  Review of Systems  Constitutional: Negative for fatigue.  HENT: Negative for hearing loss.   Respiratory: Negative for apnea, chest tightness and shortness of breath.   Cardiovascular: Negative for chest pain, palpitations and leg swelling.  Gastrointestinal: Negative for blood in stool, constipation and diarrhea.  Genitourinary: Negative for difficulty urinating.  Musculoskeletal: Negative for arthralgias, back pain, joint swelling, myalgias, neck pain and neck stiffness.  Neurological: Negative for weakness, numbness and headaches.  Hematological: Does not bruise/bleed easily.  Psychiatric/Behavioral: Negative for sleep disturbance. The patient is not nervous/anxious.      Objective: Vital Signs: BP (!) 155/88   Pulse 69   Resp 16   Ht 6' (1.829 m)   Wt 268 lb (121.6 kg)   BMI 36.35 kg/m   Physical Exam  Ortho Exam awake alert and oriented 3. Comfortable sitting. Examination the left knee without evidence of effusion. He does have posterior medial joint pain. There is some lateral joint pain as well. Mild patellar crepitation but without any significant pain. There is some popliteal fullness. No calf discomfort or distal edema. Neurovascular exam intact. Straight leg raise negative. Painless range of motion both hips  Specialty Comments:  No specialty comments available.  Imaging:  No results found.   PMFS History: Patient Active Problem List   Diagnosis Date Noted  . PVC's (premature ventricular contractions)   . Dyspnea 12/06/2016  . Osteoarthritis of right hip 09/22/2015  . S/P total hip arthroplasty 09/22/2015  . Obesity 05/08/2014  . Osteoarthritis of knee 05/08/2014  . S/P total knee replacement using cement 05/06/2014  . Knee pain 04/08/2014  . Preoperative clearance 04/08/2014  . Coronary artery disease   . Diastolic dysfunction   . Hypertension   . Orthostatic hypotension   . Hypercholesterolemia      Past Medical History:  Diagnosis Date  . Arthritis    "joints; shoulders; left elbow; right thumb" (05/06/2014)  . Cataracts, bilateral    immature  . Constipation    takes Colace and Metamucil daily  . Coronary artery disease 02/2011   cath 11/2016 showing 20% RCA, 50% OM, 35% mid LCx, 70% D1, 65% mid LAD, 70% distal LAD 80% on medical management  . Diastolic dysfunction   . Heart murmur   . Hemorrhoids   . History of bronchitis   . Hypercholesterolemia    takes Crestor daily  . Hypertension   . Joint pain   . Nocturia   . Orthostatic hypotension   . PVC's (premature ventricular contractions)    PVC load 0.2% by Holter 12/2016  . Sciatica     Family History  Problem Relation Age of Onset  . CVA Mother   . CVA Father   . Heart disease Father     Past Surgical History:  Procedure Laterality Date  . ACHILLES TENDON REPAIR Left    "& fixed a spur"  . CARDIAC CATHETERIZATION  2012  . CARDIAC CATHETERIZATION N/A 12/06/2016   Procedure: Left Heart Cath and Coronary Angiography;  Surgeon: Christian Crome, MD;  Location: [facility];  Service: Cardiovascular;  Laterality: N/A;  . COLONOSCOPY    . FLEXIBLE SIGMOIDOSCOPY  X 2  . HAND SURGERY Left    "thumb; cleaned out arthritis"  . HEEL SPUR EXCISION Left   . KNEE ARTHROSCOPY Right   . SHOULDER SURGERY Right X 2   "cleaned out spurs"  . TOE FUSION Right    great toe; plates and screws  . TONSILLECTOMY  ~ 1950  . TOTAL HIP ARTHROPLASTY Right 09/22/2015   Procedure: TOTAL HIP ARTHROPLASTY;  Surgeon: Christian Balding, MD;  Location: [facility];  Service: Orthopedics;  Laterality: Right;  . TOTAL KNEE ARTHROPLASTY Right 05/06/2014  . TOTAL KNEE ARTHROPLASTY Right 05/06/2014   Procedure: RIGHT TOTAL KNEE ARTHROPLASTY;  Surgeon: Christian Balding, MD;  Location: [facility];  Service: Orthopedics;  Laterality: Right;   Social History   Occupational History  . Not on file  Tobacco Use  . Smoking status: Former Smoker     Packs/day: 2.00    Years: 10.00    Pack years: 20.00    Types: Cigarettes  . Smokeless tobacco: Never Used  . Tobacco comment: quit smoking in 1981  Substance and Sexual Activity  . Alcohol use: Yes    Comment: "drink a beer sometimes once/month"  . Drug use: No  . Sexual activity: No

## 2018-01-01 ENCOUNTER — Encounter (INDEPENDENT_AMBULATORY_CARE_PROVIDER_SITE_OTHER): Payer: Self-pay | Admitting: Orthopaedic Surgery

## 2018-01-02 NOTE — Telephone Encounter (Signed)
Please advise and I will call.

## 2018-01-04 DIAGNOSIS — M1711 Unilateral primary osteoarthritis, right knee: Secondary | ICD-10-CM | POA: Diagnosis not present

## 2018-01-04 DIAGNOSIS — M659 Synovitis and tenosynovitis, unspecified: Secondary | ICD-10-CM | POA: Diagnosis not present

## 2018-01-04 DIAGNOSIS — M23241 Derangement of anterior horn of lateral meniscus due to old tear or injury, right knee: Secondary | ICD-10-CM | POA: Diagnosis not present

## 2018-01-04 DIAGNOSIS — M23242 Derangement of anterior horn of lateral meniscus due to old tear or injury, left knee: Secondary | ICD-10-CM | POA: Diagnosis not present

## 2018-01-04 DIAGNOSIS — G8918 Other acute postprocedural pain: Secondary | ICD-10-CM | POA: Diagnosis not present

## 2018-01-04 DIAGNOSIS — M94261 Chondromalacia, right knee: Secondary | ICD-10-CM | POA: Diagnosis not present

## 2018-01-04 DIAGNOSIS — M23221 Derangement of posterior horn of medial meniscus due to old tear or injury, right knee: Secondary | ICD-10-CM | POA: Diagnosis not present

## 2018-01-04 DIAGNOSIS — M23222 Derangement of posterior horn of medial meniscus due to old tear or injury, left knee: Secondary | ICD-10-CM | POA: Diagnosis not present

## 2018-01-08 ENCOUNTER — Ambulatory Visit (INDEPENDENT_AMBULATORY_CARE_PROVIDER_SITE_OTHER): Payer: Medicare Other | Admitting: Orthopaedic Surgery

## 2018-01-08 ENCOUNTER — Encounter (INDEPENDENT_AMBULATORY_CARE_PROVIDER_SITE_OTHER): Payer: Self-pay | Admitting: Orthopaedic Surgery

## 2018-01-08 VITALS — BP 121/73 | HR 71 | Resp 14 | Ht 71.0 in | Wt 250.0 lb

## 2018-01-08 DIAGNOSIS — G8929 Other chronic pain: Secondary | ICD-10-CM

## 2018-01-08 DIAGNOSIS — M25562 Pain in left knee: Secondary | ICD-10-CM

## 2018-01-08 NOTE — Progress Notes (Deleted)
Office Visit Note   Patient: Christian [name]           Date of Birth: 01-21-1946           MRN: [id number] Visit Date: 01/08/2018              Requested by: Alroy Dust, L.Marlou Sa, [place] [address] [employer] [address] [place], [place] [address] PCP: Alroy Dust, L.Marlou Sa, MD   Assessment & Plan: Visit Diagnoses: No diagnosis found.  Plan: ***  Follow-Up Instructions: No Follow-up on file.   Orders:  No orders of the defined types were placed in this encounter.  No orders of the defined types were placed in this encounter.     Procedures: No procedures performed   Clinical Data: No additional findings.   Subjective: Chief Complaint  Patient presents with  . Left Knee - Routine Post Op    Mr. Christian [name] is a 72 y o S/P 4 days left knee arthroscopy. He relates it feels 100% better.    HPI  Review of Systems  Constitutional: Negative for fatigue.  HENT: Negative for hearing loss.   Respiratory: Negative for apnea, chest tightness and shortness of breath.   Cardiovascular: Negative for chest pain, palpitations and leg swelling.  Gastrointestinal: Negative for blood in stool, constipation and diarrhea.  Genitourinary: Negative for difficulty urinating.  Musculoskeletal: Positive for arthralgias. Negative for back pain, joint swelling, myalgias, neck pain and neck stiffness.  Neurological: Negative for weakness, numbness and headaches.  Hematological: Does not bruise/bleed easily.  Psychiatric/Behavioral: Negative for sleep disturbance. The patient is not nervous/anxious.      Objective: Vital Signs: BP 121/73   Pulse 71   Resp 14   Ht 5\' 11"  (1.803 m)   Wt 250 lb (113.4 kg)   BMI 34.87 kg/m   Physical Exam  Ortho Exam  Specialty Comments:  No specialty comments available.  Imaging: No results found.   PMFS History: Patient Active Problem List   Diagnosis Date Noted  . PVC's (premature ventricular contractions)   . Dyspnea 12/06/2016  . Osteoarthritis of right  hip 09/22/2015  . S/P total hip arthroplasty 09/22/2015  . Obesity 05/08/2014  . Osteoarthritis of knee 05/08/2014  . S/P total knee replacement using cement 05/06/2014  . Knee pain 04/08/2014  . Preoperative clearance 04/08/2014  . Coronary artery disease   . Diastolic dysfunction   . Hypertension   . Orthostatic hypotension   . Hypercholesterolemia    Past Medical History:  Diagnosis Date  . Arthritis    "joints; shoulders; left elbow; right thumb" (05/06/2014)  . Cataracts, bilateral    immature  . Constipation    takes Colace and Metamucil daily  . Coronary artery disease 02/2011   cath 11/2016 showing 20% RCA, 50% OM, 35% mid LCx, 70% D1, 65% mid LAD, 70% distal LAD 80% on medical management  . Diastolic dysfunction   . Heart murmur   . Hemorrhoids   . History of bronchitis   . Hypercholesterolemia    takes Crestor daily  . Hypertension   . Joint pain   . Nocturia   . Orthostatic hypotension   . PVC's (premature ventricular contractions)    PVC load 0.2% by Holter 12/2016  . Sciatica     Family History  Problem Relation Age of Onset  . CVA Mother   . CVA Father   . Heart disease Father     Past Surgical History:  Procedure Laterality Date  . ACHILLES TENDON REPAIR Left    "&  fixed a spur"  . CARDIAC CATHETERIZATION  2012  . CARDIAC CATHETERIZATION N/A 12/06/2016   Procedure: Left Heart Cath and Coronary Angiography;  Surgeon: Belva Crome, MD;  Location: [facility];  Service: Cardiovascular;  Laterality: N/A;  . COLONOSCOPY    . FLEXIBLE SIGMOIDOSCOPY  X 2  . HAND SURGERY Left    "thumb; cleaned out arthritis"  . HEEL SPUR EXCISION Left   . KNEE ARTHROSCOPY Right   . SHOULDER SURGERY Right X 2   "cleaned out spurs"  . TOE FUSION Right    great toe; plates and screws  . TONSILLECTOMY  ~ 1950  . TOTAL HIP ARTHROPLASTY Right 09/22/2015   Procedure: TOTAL HIP ARTHROPLASTY;  Surgeon: Garald Balding, MD;  Location: [facility];  Service: Orthopedics;   Laterality: Right;  . TOTAL KNEE ARTHROPLASTY Right 05/06/2014  . TOTAL KNEE ARTHROPLASTY Right 05/06/2014   Procedure: RIGHT TOTAL KNEE ARTHROPLASTY;  Surgeon: Garald Balding, MD;  Location: [facility];  Service: Orthopedics;  Laterality: Right;   Social History   Occupational History  . Not on file  Tobacco Use  . Smoking status: Former Smoker    Packs/day: 2.00    Years: 10.00    Pack years: 20.00    Types: Cigarettes  . Smokeless tobacco: Never Used  . Tobacco comment: quit smoking in 1981  Substance and Sexual Activity  . Alcohol use: Yes    Comment: "drink a beer sometimes once/month"  . Drug use: No  . Sexual activity: No

## 2018-01-08 NOTE — Progress Notes (Signed)
Cardiology Office Note:    Date:  01/09/2018   ID:  Christian [name], DOB [birth year], MRN [id number]  PCP:  Aurea Graff.Marlou Sa, MD  Cardiologist:  No primary care provider on file.    Referring MD: Alroy Dust, L.Marlou Sa, MD   Chief Complaint  Patient presents with  . Coronary Artery Disease  . Hypertension  . Hyperlipidemia    History of Present Illness:    Christian [name] is a 72 y.o. male with a history of ASCAD with  cath 11/2016 showing 20% RCA, 50% OM, 35% mid LCx, 70% D1, 65% mid LAD, 70% distal LAD on medical management.  He also was noted to have an elevated LVEDP at 20 with EF 45-50% by cath and 50-55% by echo. He has a history ofHTN and dyslipidemia as well.  He is here today for followup and is doing well.   He has chronic stable angina that is stable and usually when doing extreme exertion going up an incline.  He has chornic DOE which is stable.  He denies any PND, orthopnea, LE edema, dizziness, palpitations or syncope. He is compliant with his meds and is tolerating meds with no SE.     Past Medical History:  Diagnosis Date  . Arthritis    "joints; shoulders; left elbow; right thumb" (05/06/2014)  . Cataracts, bilateral    immature  . Constipation    takes Colace and Metamucil daily  . Coronary artery disease 02/2011   cath 11/2016 showing 20% RCA, 50% OM, 35% mid LCx, 70% D1, 65% mid LAD, 70% distal LAD 80% on medical management  . Diastolic dysfunction   . Heart murmur   . Hemorrhoids   . History of bronchitis   . Hypercholesterolemia    takes Crestor daily  . Hypertension   . Joint pain   . Nocturia   . Orthostatic hypotension   . PVC's (premature ventricular contractions)    PVC load 0.2% by Holter 12/2016  . Sciatica     Past Surgical History:  Procedure Laterality Date  . ACHILLES TENDON REPAIR Left    "& fixed a spur"  . CARDIAC CATHETERIZATION  2012  . CARDIAC CATHETERIZATION N/A 12/06/2016   Procedure: Left Heart Cath and Coronary Angiography;   Surgeon: Belva Crome, MD;  Location: [facility];  Service: Cardiovascular;  Laterality: N/A;  . COLONOSCOPY    . FLEXIBLE SIGMOIDOSCOPY  X 2  . HAND SURGERY Left    "thumb; cleaned out arthritis"  . HEEL SPUR EXCISION Left   . KNEE ARTHROSCOPY Right   . SHOULDER SURGERY Right X 2   "cleaned out spurs"  . TOE FUSION Right    great toe; plates and screws  . TONSILLECTOMY  ~ 1950  . TOTAL HIP ARTHROPLASTY Right 09/22/2015   Procedure: TOTAL HIP ARTHROPLASTY;  Surgeon: Garald Balding, MD;  Location: [facility];  Service: Orthopedics;  Laterality: Right;  . TOTAL KNEE ARTHROPLASTY Right 05/06/2014  . TOTAL KNEE ARTHROPLASTY Right 05/06/2014   Procedure: RIGHT TOTAL KNEE ARTHROPLASTY;  Surgeon: Garald Balding, MD;  Location: [facility];  Service: Orthopedics;  Laterality: Right;    Current Medications: Current Meds  Medication Sig  . amoxicillin (AMOXIL) 500 MG tablet Take 4 tablets 2 hours prior to any dental procedure  . aspirin 81 MG tablet Take 81 mg by mouth daily.  . diclofenac sodium (VOLTAREN) 1 % GEL Apply 2-4 g 4 (four) times daily topically.  . docusate sodium (STOOL SOFTENER) 100 MG  capsule Take 200 mg by mouth daily.  . isosorbide mononitrate (IMDUR) 60 MG 24 hr tablet Take 1 tablet (60 mg total) by mouth daily.  [name] Kitchen lisinopril (PRINIVIL,ZESTRIL) 20 MG tablet Take 1 tablet (20 mg total) by mouth daily.  . nitroGLYCERIN (NITROSTAT) 0.4 MG SL tablet PLACE 1 TABLET UNDER THE TONGUE EVERY 5 MINUTES AS NEEDED FOR CHEST PAIN  . oxyCODONE (OXY IR/ROXICODONE) 5 MG immediate release tablet TK 1 TO 2 TS PO Q  3 TO 4 H PRN P. MAX 12 TS IN 24 H  . rosuvastatin (CRESTOR) 20 MG tablet TAKE 1 TABLET(20 MG) BY MOUTH DAILY  . traMADol (ULTRAM) 50 MG tablet Take 1-2 tablets(100mg  total) by mouth at bedtime.     Allergies:   Atorvastatin calcium [atorvastatin]; Simvastatin; and Pravastatin   Social History   Socioeconomic History  . Marital status: Married    Spouse name: None  .  Number of children: None  . Years of education: None  . Highest education level: None  Social Needs  . Financial resource strain: None  . Food insecurity - worry: None  . Food insecurity - inability: None  . Transportation needs - medical: None  . Transportation needs - non-medical: None  Occupational History  . None  Tobacco Use  . Smoking status: Former Smoker    Packs/day: 2.00    Years: 10.00    Pack years: 20.00    Types: Cigarettes  . Smokeless tobacco: Never Used  . Tobacco comment: quit smoking in 1981  Substance and Sexual Activity  . Alcohol use: Yes    Comment: "drink a beer sometimes once/month"  . Drug use: No  . Sexual activity: No  Other Topics Concern  . None  Social History Narrative  . None     Family History: The patient's family history includes CVA in his father and mother; Heart disease in his father.  ROS:   Please see the history of present illness.    ROS  All other systems reviewed and negative.   EKGs/Labs/Other Studies Reviewed:    The following studies were reviewed today: none  EKG:  EKG is ordered today and showed NSR with PACs, IRBBB, inferior infarct  Recent Labs: 01/26/2017: ALT 9; NT-Pro BNP 236 06/29/2017: BUN 22; Creatinine, Ser 1.08; Potassium 4.8; Sodium 137   Recent Lipid Panel    Component Value Date/Time   CHOL 135 01/26/2017 0837   TRIG 71 01/26/2017 0837   HDL 47 01/26/2017 0837   CHOLHDL 2.9 01/26/2017 0837   CHOLHDL 2.9 04/27/2016 0826   VLDL 14 04/27/2016 0826   LDLCALC 74 01/26/2017 0837    Physical Exam:    VS:  BP (!) 144/92   Pulse 65   Ht 5\' 11"  (1.803 m)   Wt 283 lb 6.4 oz (128.5 kg)   BMI 39.53 kg/m     Wt Readings from Last 3 Encounters:  01/09/18 283 lb 6.4 oz (128.5 kg)  01/08/18 250 lb (113.4 kg)  12/29/17 268 lb (121.6 kg)     GEN:  Well nourished, well developed in no acute distress HEENT: Normal NECK: No JVD; No carotid bruits LYMPHATICS: No lymphadenopathy CARDIAC: RRR, no  murmurs, rubs, gallops RESPIRATORY:  Clear to auscultation without rales, wheezing or rhonchi  ABDOMEN: Soft, non-tender, non-distended MUSCULOSKELETAL:  No edema; No deformity  SKIN: Warm and dry NEUROLOGIC:  Alert and oriented x 3 PSYCHIATRIC:  Normal affect   ASSESSMENT:    1. Coronary artery disease involving native coronary  artery of native heart without angina pectoris   2. Essential hypertension   3. PVC's (premature ventricular contractions)   4. Hypercholesterolemia    PLAN:    In order of problems listed above:  1.  ASCAD -  cath 11/2016 showing 20% RCA, 50% OM, 35% mid LCx, 70% D1, 65% mid LAD, 70% distal LAD.  He has chronic angina which is stable and will continue on medical management.  He will continue on ASA 81mg  daily, statin and Imdur 60mg  daily.  He did not tolerate BB due to soft BP.   2.  HTN - BP is borderline controlled on exam today but he had just taken his BP meds. He will continue on Lisinopril 20mg  daily.  3.  PVC's and PACs - theses are asymptomatic.   4.  Hyperlipidemia with LDL goal < 70.  He will continue on Crestor 20mg  daily.  I will repeat an FLP and ALT.     Medication Adjustments/Labs and Tests Ordered: Current medicines are reviewed at length with the patient today.  Concerns regarding medicines are outlined above.  Orders Placed This Encounter  Procedures  . EKG 12-Lead   No orders of the defined types were placed in this encounter.   Signed, Fransico Him, MD  01/09/2018 8:12 AM    [place]

## 2018-01-08 NOTE — Progress Notes (Signed)
Office Visit Note   Patient: Christian [name]           Date of Birth: 01-26-1946           MRN: [id number] Visit Date: 01/08/2018              Requested by: Alroy Dust, L.Marlou Sa, [place] [address] [place], [place] [address] PCP: Alroy Dust, L.Marlou Sa, MD   Assessment & Plan: Visit Diagnoses:  1. Chronic pain of left knee     Plan: 4 days status post left knee arthroscopy. There was a tear of both medial lateral menisci. These were debrided. There was also some significant areas of chondromalacia particularly medially where there are a few grade 4 changes of chondromalacia. Mr. Hayhurst relates that he doesn't have any the pain that he had preoperatively is very happy with his present course. No fever or chills. Discussed activity modification. We'll see back in 2 weeks  Follow-Up Instructions: Return in about 2 weeks (around 01/22/2018).   Orders:  No orders of the defined types were placed in this encounter.  No orders of the defined types were placed in this encounter.     Procedures: No procedures performed   Clinical Data: No additional findings.   Subjective: Chief Complaint  Patient presents with  . Left Knee - Routine Post Op    Mr. Peerson is a 72 y o S/P 4 days left knee arthroscopy. He relates it feels 100% better.  Doing well postoperatively. Only taking 1 pain pill last 24 hours. No related fever or chills or calf pain.  HPI  Review of Systems   Objective: Vital Signs: BP 121/73   Pulse 71   Resp 14   Ht 5\' 11"  (1.803 m)   Wt 250 lb (113.4 kg)   BMI 34.87 kg/m   Physical Exam  Ortho Exam Awake alert and oriented 3. Comfortable sitting. Minimal effusion left knee. Arthroscopic portals clean and dry. Up with Band-Aids applied. No calf pain. No distal edema. Neurovascular exam intact. Flexed about 100. Medial or lateral joint pain. Specialty Comments:  No specialty comments available.  Imaging: No results found.   PMFS History: Patient  Active Problem List   Diagnosis Date Noted  . PVC's (premature ventricular contractions)   . Dyspnea 12/06/2016  . Osteoarthritis of right hip 09/22/2015  . S/P total hip arthroplasty 09/22/2015  . Obesity 05/08/2014  . Osteoarthritis of knee 05/08/2014  . S/P total knee replacement using cement 05/06/2014  . Knee pain 04/08/2014  . Preoperative clearance 04/08/2014  . Coronary artery disease   . Diastolic dysfunction   . Hypertension   . Orthostatic hypotension   . Hypercholesterolemia    Past Medical History:  Diagnosis Date  . Arthritis    "joints; shoulders; left elbow; right thumb" (05/06/2014)  . Cataracts, bilateral    immature  . Constipation    takes Colace and Metamucil daily  . Coronary artery disease 02/2011   cath 11/2016 showing 20% RCA, 50% OM, 35% mid LCx, 70% D1, 65% mid LAD, 70% distal LAD 80% on medical management  . Diastolic dysfunction   . Heart murmur   . Hemorrhoids   . History of bronchitis   . Hypercholesterolemia    takes Crestor daily  . Hypertension   . Joint pain   . Nocturia   . Orthostatic hypotension   . PVC's (premature ventricular contractions)    PVC load 0.2% by Holter 12/2016  . Sciatica  Family History  Problem Relation Age of Onset  . CVA Mother   . CVA Father   . Heart disease Father     Past Surgical History:  Procedure Laterality Date  . ACHILLES TENDON REPAIR Left    "& fixed a spur"  . CARDIAC CATHETERIZATION  2012  . CARDIAC CATHETERIZATION N/A 12/06/2016   Procedure: Left Heart Cath and Coronary Angiography;  Surgeon: Belva Crome, MD;  Location: [facility];  Service: Cardiovascular;  Laterality: N/A;  . COLONOSCOPY    . FLEXIBLE SIGMOIDOSCOPY  X 2  . HAND SURGERY Left    "thumb; cleaned out arthritis"  . HEEL SPUR EXCISION Left   . KNEE ARTHROSCOPY Right   . SHOULDER SURGERY Right X 2   "cleaned out spurs"  . TOE FUSION Right    great toe; plates and screws  . TONSILLECTOMY  ~ 1950  . TOTAL HIP  ARTHROPLASTY Right 09/22/2015   Procedure: TOTAL HIP ARTHROPLASTY;  Surgeon: Garald Balding, MD;  Location: [facility];  Service: Orthopedics;  Laterality: Right;  . TOTAL KNEE ARTHROPLASTY Right 05/06/2014  . TOTAL KNEE ARTHROPLASTY Right 05/06/2014   Procedure: RIGHT TOTAL KNEE ARTHROPLASTY;  Surgeon: Garald Balding, MD;  Location: [facility];  Service: Orthopedics;  Laterality: Right;   Social History   Occupational History  . Not on file  Tobacco Use  . Smoking status: Former Smoker    Packs/day: 2.00    Years: 10.00    Pack years: 20.00    Types: Cigarettes  . Smokeless tobacco: Never Used  . Tobacco comment: quit smoking in 1981  Substance and Sexual Activity  . Alcohol use: Yes    Comment: "drink a beer sometimes once/month"  . Drug use: No  . Sexual activity: No     Garald Balding, MD   Note - This record has been created using [employer].  Chart creation errors have been sought, but may not always  have been located. Such creation errors do not reflect on  the standard of medical care.

## 2018-01-09 ENCOUNTER — Ambulatory Visit (INDEPENDENT_AMBULATORY_CARE_PROVIDER_SITE_OTHER): Payer: Medicare Other | Admitting: Cardiology

## 2018-01-09 ENCOUNTER — Encounter: Payer: Self-pay | Admitting: Cardiology

## 2018-01-09 VITALS — BP 144/92 | HR 65 | Ht 71.0 in | Wt 283.4 lb

## 2018-01-09 DIAGNOSIS — E7800 Pure hypercholesterolemia, unspecified: Secondary | ICD-10-CM | POA: Diagnosis not present

## 2018-01-09 DIAGNOSIS — I10 Essential (primary) hypertension: Secondary | ICD-10-CM

## 2018-01-09 DIAGNOSIS — I493 Ventricular premature depolarization: Secondary | ICD-10-CM

## 2018-01-09 DIAGNOSIS — I2510 Atherosclerotic heart disease of native coronary artery without angina pectoris: Secondary | ICD-10-CM | POA: Diagnosis not present

## 2018-01-09 NOTE — Patient Instructions (Signed)
Medication Instructions:  Your physician recommends that you continue on your current medications as directed. Please refer to the Current Medication list given to you today.  Labwork: Today for kidney function, liver function, and fasting lipids  Testing/Procedures: None ordered   Follow-Up: Your physician wants you to follow-up in: 1 year with [name]. You will receive a reminder letter in the mail two months in advance. If you don't receive a letter, please call our office to schedule the follow-up appointment.  Any Other Special Instructions Will Be Listed Below (If Applicable).     If you need a refill on your cardiac medications before your next appointment, please call your pharmacy.

## 2018-01-10 LAB — BASIC METABOLIC PANEL
BUN / CREAT RATIO: 14 (ref 10–24)
BUN: 16 mg/dL (ref 8–27)
CO2: 22 mmol/L (ref 20–29)
Calcium: 8.9 mg/dL (ref 8.6–10.2)
Chloride: 105 mmol/L (ref 96–106)
Creatinine, Ser: 1.17 mg/dL (ref 0.76–1.27)
GFR calc Af Amer: 73 mL/min/{1.73_m2} (ref >59)
GFR, EST NON AFRICAN AMERICAN: 63 mL/min/{1.73_m2} (ref >59)
Glucose: 97 mg/dL (ref 65–99)
Potassium: 4.7 mmol/L (ref 3.5–5.2)
SODIUM: 142 mmol/L (ref 134–144)

## 2018-01-10 LAB — HEPATIC FUNCTION PANEL
ALK PHOS: 59 IU/L (ref 39–117)
ALT: 7 IU/L (ref 0–44)
AST: 10 IU/L (ref 0–40)
Albumin: 3.8 g/dL (ref 3.5–4.8)
Bilirubin Total: 0.2 mg/dL (ref 0.0–1.2)
Bilirubin, Direct: 0.08 mg/dL (ref 0.00–0.40)
Total Protein: 6.2 g/dL (ref 6.0–8.5)

## 2018-01-10 LAB — LIPID PANEL
CHOLESTEROL TOTAL: 114 mg/dL (ref 100–199)
Chol/HDL Ratio: 2.5 ratio (ref 0.0–5.0)
HDL: 45 mg/dL (ref >39)
LDL Calculated: 55 mg/dL (ref 0–99)
TRIGLYCERIDES: 71 mg/dL (ref 0–149)
VLDL Cholesterol Cal: 14 mg/dL (ref 5–40)

## 2018-01-29 ENCOUNTER — Ambulatory Visit (INDEPENDENT_AMBULATORY_CARE_PROVIDER_SITE_OTHER): Payer: Medicare Other | Admitting: Orthopaedic Surgery

## 2018-01-29 ENCOUNTER — Encounter (INDEPENDENT_AMBULATORY_CARE_PROVIDER_SITE_OTHER): Payer: Self-pay | Admitting: Orthopaedic Surgery

## 2018-01-29 DIAGNOSIS — G8929 Other chronic pain: Secondary | ICD-10-CM

## 2018-01-29 DIAGNOSIS — M25562 Pain in left knee: Secondary | ICD-10-CM

## 2018-01-29 NOTE — Progress Notes (Deleted)
Office Visit Note   Patient: Christian [name]           Date of Birth: 11-25-1946           MRN: [id number] Visit Date: 01/29/2018              Requested by: Alroy Dust, L.Marlou Sa, [place] [address] [place], [place] [address] PCP: Alroy Dust, L.Marlou Sa, MD   Assessment & Plan: Visit Diagnoses: No diagnosis found.  Plan: ***  Follow-Up Instructions: No Follow-up on file.   Orders:  No orders of the defined types were placed in this encounter.  No orders of the defined types were placed in this encounter.     Procedures: No procedures performed   Clinical Data: No additional findings.   Subjective: Chief Complaint  Patient presents with  . Left Knee - Routine Post Op    Christian [name] is a 72 y o S/P 3.5 weeks left knee arthroscopy. Some soreness. Pt started driving last week.    HPI  Review of Systems  Constitutional: Negative for fatigue.  HENT: Negative for hearing loss.   Respiratory: Negative for apnea, chest tightness and shortness of breath.   Cardiovascular: Negative for chest pain, palpitations and leg swelling.  Gastrointestinal: Negative for blood in stool, constipation and diarrhea.  Genitourinary: Negative for difficulty urinating.  Musculoskeletal: Negative for arthralgias, back pain, joint swelling, myalgias, neck pain and neck stiffness.  Neurological: Negative for weakness, numbness and headaches.  Hematological: Does not bruise/bleed easily.  Psychiatric/Behavioral: Negative for sleep disturbance. The patient is not nervous/anxious.      Objective: Vital Signs: There were no vitals taken for this visit.  Physical Exam  Ortho Exam  Specialty Comments:  No specialty comments available.  Imaging: No results found.   PMFS History: Patient Active Problem List   Diagnosis Date Noted  . PVC's (premature ventricular contractions)   . Dyspnea 12/06/2016  . Osteoarthritis of right hip 09/22/2015  . S/P total hip arthroplasty 09/22/2015   . Obesity 05/08/2014  . Osteoarthritis of knee 05/08/2014  . S/P total knee replacement using cement 05/06/2014  . Knee pain 04/08/2014  . Preoperative clearance 04/08/2014  . Coronary artery disease   . Diastolic dysfunction   . Hypertension   . Orthostatic hypotension   . Hypercholesterolemia    Past Medical History:  Diagnosis Date  . Arthritis    "joints; shoulders; left elbow; right thumb" (05/06/2014)  . Cataracts, bilateral    immature  . Constipation    takes Colace and Metamucil daily  . Coronary artery disease 02/2011   cath 11/2016 showing 20% RCA, 50% OM, 35% mid LCx, 70% D1, 65% mid LAD, 70% distal LAD 80% on medical management  . Diastolic dysfunction   . Heart murmur   . Hemorrhoids   . History of bronchitis   . Hypercholesterolemia    takes Crestor daily  . Hypertension   . Joint pain   . Nocturia   . Orthostatic hypotension   . PVC's (premature ventricular contractions)    PVC load 0.2% by Holter 12/2016  . Sciatica     Family History  Problem Relation Age of Onset  . CVA Mother   . CVA Father   . Heart disease Father     Past Surgical History:  Procedure Laterality Date  . ACHILLES TENDON REPAIR Left    "& fixed a spur"  . CARDIAC CATHETERIZATION  2012  . CARDIAC CATHETERIZATION N/A 12/06/2016   Procedure: Left Heart  Cath and Coronary Angiography;  Surgeon: Belva Crome, MD;  Location: [facility];  Service: Cardiovascular;  Laterality: N/A;  . COLONOSCOPY    . FLEXIBLE SIGMOIDOSCOPY  X 2  . HAND SURGERY Left    "thumb; cleaned out arthritis"  . HEEL SPUR EXCISION Left   . KNEE ARTHROSCOPY Right   . SHOULDER SURGERY Right X 2   "cleaned out spurs"  . TOE FUSION Right    great toe; plates and screws  . TONSILLECTOMY  ~ 1950  . TOTAL HIP ARTHROPLASTY Right 09/22/2015   Procedure: TOTAL HIP ARTHROPLASTY;  Surgeon: Garald Balding, MD;  Location: [facility];  Service: Orthopedics;  Laterality: Right;  . TOTAL KNEE ARTHROPLASTY Right  05/06/2014  . TOTAL KNEE ARTHROPLASTY Right 05/06/2014   Procedure: RIGHT TOTAL KNEE ARTHROPLASTY;  Surgeon: Garald Balding, MD;  Location: [facility];  Service: Orthopedics;  Laterality: Right;   Social History   Occupational History  . Not on file  Tobacco Use  . Smoking status: Former Smoker    Packs/day: 2.00    Years: 10.00    Pack years: 20.00    Types: Cigarettes  . Smokeless tobacco: Never Used  . Tobacco comment: quit smoking in 1981  Substance and Sexual Activity  . Alcohol use: Yes    Comment: "drink a beer sometimes once/month"  . Drug use: No  . Sexual activity: No

## 2018-01-29 NOTE — Progress Notes (Signed)
Office Visit Note   Patient: Christian [name]           Date of Birth: [birth year]           MRN: [id number] Visit Date: 01/29/2018              Requested by: Alroy Dust, L.Marlou Sa, [place] [address] [place], [place] [address] PCP: Alroy Dust, L.Marlou Sa, MD   Assessment & Plan: Visit Diagnoses:  1. Chronic pain of left knee     Plan: 3 weeks status post left knee arthroscopy for debridement of a partial medial and lateral meniscal tear. Also has chondromalacia predominantly the medial compartment. Doing well. Much better than  Follow-Up Instructions: Return if symptoms worsen or fail to improve.   Orders:  No orders of the defined types were placed in this encounter.  No orders of the defined types were placed in this encounter.     Procedures: No procedures performed   Clinical Data: No additional findings.   Subjective: Chief Complaint  Patient presents with  . Left Knee - Routine Post Op    Christian [name] is a 72 y o S/P 3.5 weeks left knee arthroscopy. Some soreness. Pt started driving last week.  Considerably better than preop. No fever or chills. Walks with minimal limp working on exercises  HPI  Review of Systems   Objective: Vital Signs: There were no vitals taken for this visit.  Physical Exam  Ortho Exam awake alert and oriented 3. Comfortable sitting. No left knee effusion. Left knee was not hot red or warm. No instability. Full extension and flexed over 100. No calf pain. Neurovascular exam intact distally. Minimal medial joint pain  Specialty Comments:  No specialty comments available.  Imaging: No results found.   PMFS History: Patient Active Problem List   Diagnosis Date Noted  . PVC's (premature ventricular contractions)   . Dyspnea 12/06/2016  . Osteoarthritis of right hip 09/22/2015  . S/P total hip arthroplasty 09/22/2015  . Obesity 05/08/2014  . Osteoarthritis of knee 05/08/2014  . S/P total knee replacement using cement  05/06/2014  . Knee pain 04/08/2014  . Preoperative clearance 04/08/2014  . Coronary artery disease   . Diastolic dysfunction   . Hypertension   . Orthostatic hypotension   . Hypercholesterolemia    Past Medical History:  Diagnosis Date  . Arthritis    "joints; shoulders; left elbow; right thumb" (05/06/2014)  . Cataracts, bilateral    immature  . Constipation    takes Colace and Metamucil daily  . Coronary artery disease 02/2011   cath 11/2016 showing 20% RCA, 50% OM, 35% mid LCx, 70% D1, 65% mid LAD, 70% distal LAD 80% on medical management  . Diastolic dysfunction   . Heart murmur   . Hemorrhoids   . History of bronchitis   . Hypercholesterolemia    takes Crestor daily  . Hypertension   . Joint pain   . Nocturia   . Orthostatic hypotension   . PVC's (premature ventricular contractions)    PVC load 0.2% by Holter 12/2016  . Sciatica     Family History  Problem Relation Age of Onset  . CVA Mother   . CVA Father   . Heart disease Father     Past Surgical History:  Procedure Laterality Date  . ACHILLES TENDON REPAIR Left    "& fixed a spur"  . CARDIAC CATHETERIZATION  2012  . CARDIAC CATHETERIZATION N/A 12/06/2016   Procedure: Left Heart Cath and  Coronary Angiography;  Surgeon: Belva Crome, MD;  Location: [facility];  Service: Cardiovascular;  Laterality: N/A;  . COLONOSCOPY    . FLEXIBLE SIGMOIDOSCOPY  X 2  . HAND SURGERY Left    "thumb; cleaned out arthritis"  . HEEL SPUR EXCISION Left   . KNEE ARTHROSCOPY Right   . SHOULDER SURGERY Right X 2   "cleaned out spurs"  . TOE FUSION Right    great toe; plates and screws  . TONSILLECTOMY  ~ 1950  . TOTAL HIP ARTHROPLASTY Right 09/22/2015   Procedure: TOTAL HIP ARTHROPLASTY;  Surgeon: Garald Balding, MD;  Location: [facility];  Service: Orthopedics;  Laterality: Right;  . TOTAL KNEE ARTHROPLASTY Right 05/06/2014  . TOTAL KNEE ARTHROPLASTY Right 05/06/2014   Procedure: RIGHT TOTAL KNEE ARTHROPLASTY;  Surgeon:  Garald Balding, MD;  Location: [facility];  Service: Orthopedics;  Laterality: Right;   Social History   Occupational History  . Not on file  Tobacco Use  . Smoking status: Former Smoker    Packs/day: 2.00    Years: 10.00    Pack years: 20.00    Types: Cigarettes  . Smokeless tobacco: Never Used  . Tobacco comment: quit smoking in 1981  Substance and Sexual Activity  . Alcohol use: Yes    Comment: "drink a beer sometimes once/month"  . Drug use: No  . Sexual activity: No     Garald Balding, MD   Note - This record has been created using [employer].  Chart creation errors have been sought, but may not always  have been located. Such creation errors do not reflect on  the standard of medical care.

## 2018-02-01 ENCOUNTER — Other Ambulatory Visit: Payer: Self-pay | Admitting: Family Medicine

## 2018-02-01 DIAGNOSIS — K5900 Constipation, unspecified: Secondary | ICD-10-CM | POA: Diagnosis not present

## 2018-02-01 DIAGNOSIS — E669 Obesity, unspecified: Secondary | ICD-10-CM | POA: Diagnosis not present

## 2018-02-01 DIAGNOSIS — R1012 Left upper quadrant pain: Secondary | ICD-10-CM

## 2018-02-01 DIAGNOSIS — R5383 Other fatigue: Secondary | ICD-10-CM | POA: Diagnosis not present

## 2018-02-10 ENCOUNTER — Other Ambulatory Visit: Payer: Self-pay | Admitting: Physician Assistant

## 2018-02-10 ENCOUNTER — Ambulatory Visit
Admission: RE | Admit: 2018-02-10 | Discharge: 2018-02-10 | Disposition: A | Discharge status: Home / Self Care | Payer: Medicare Other | Source: Ambulatory Visit | Attending: Family Medicine | Admitting: Family Medicine

## 2018-02-10 DIAGNOSIS — K429 Umbilical hernia without obstruction or gangrene: Secondary | ICD-10-CM | POA: Diagnosis not present

## 2018-02-10 DIAGNOSIS — R0602 Shortness of breath: Secondary | ICD-10-CM

## 2018-02-10 DIAGNOSIS — R1012 Left upper quadrant pain: Secondary | ICD-10-CM

## 2018-02-10 MED ORDER — IOPAMIDOL (ISOVUE-300) INJECTION 61%
125.0000 mL | Freq: Once | INTRAVENOUS | Status: AC | PRN
  Administered 2018-02-10: 125 mL via INTRAVENOUS

## 2018-04-22 DIAGNOSIS — A09 Infectious gastroenteritis and colitis, unspecified: Secondary | ICD-10-CM | POA: Diagnosis not present

## 2018-04-22 DIAGNOSIS — M1009 Idiopathic gout, multiple sites: Secondary | ICD-10-CM | POA: Diagnosis not present

## 2018-04-22 DIAGNOSIS — I10 Essential (primary) hypertension: Secondary | ICD-10-CM | POA: Diagnosis not present

## 2018-05-01 ENCOUNTER — Encounter (INDEPENDENT_AMBULATORY_CARE_PROVIDER_SITE_OTHER): Payer: Self-pay | Admitting: Orthopaedic Surgery

## 2018-05-01 ENCOUNTER — Ambulatory Visit (INDEPENDENT_AMBULATORY_CARE_PROVIDER_SITE_OTHER): Payer: Medicare Other | Admitting: Orthopaedic Surgery

## 2018-05-01 ENCOUNTER — Telehealth (INDEPENDENT_AMBULATORY_CARE_PROVIDER_SITE_OTHER): Payer: Self-pay

## 2018-05-01 VITALS — BP 128/93 | HR 81 | Resp 16 | Ht 71.0 in | Wt 265.0 lb

## 2018-05-01 DIAGNOSIS — M1712 Unilateral primary osteoarthritis, left knee: Secondary | ICD-10-CM

## 2018-05-01 DIAGNOSIS — I2510 Atherosclerotic heart disease of native coronary artery without angina pectoris: Secondary | ICD-10-CM

## 2018-05-01 NOTE — Telephone Encounter (Signed)
Noted. Thank You.

## 2018-05-01 NOTE — Progress Notes (Signed)
Office Visit Note   Patient: Christian [name]           Date of Birth: 1946/07/13           MRN: [id number] Visit Date: 05/01/2018              Requested by: Alroy Dust, L.Marlou Sa, [place] [address] [place], [place] [address] PCP: Alroy Dust, L.Marlou Sa, MD   Assessment & Plan: Visit Diagnoses:  1. Primary osteoarthritis of left knee     Plan: Christian [name] is status post left knee arthroscopy in February.  He had a torn medial meniscus and considerable degenerative arthrosis particularly in the medial compartment where he had grade 3 and 4 changes.  He still having some discomfort.  We had a long discussion this morning regarding treatment options.  He is had a prior right total knee replacement doing very well but would like to "hold off as long as I could on the left.  We will try her cortisone injection today and then consider Visco supplementation in the next 2 weeks.  Follow-Up Instructions: Return in about 2 weeks (around 05/15/2018).   Orders:  No orders of the defined types were placed in this encounter.  No orders of the defined types were placed in this encounter.     Procedures: No procedures performed   Clinical Data: No additional findings.   Subjective: Chief Complaint  Patient presents with  . Left Knee - Pain  . Follow-up    l knee scope jan or feb 2019, still having issues woulf like to discuss surgery. fels like it will give away and having pain but no numbness  Christian [name] is still having some discomfort in his left knee status post arthroscopy in February.  He had a tear of the medial meniscus and considerable degenerative changes in all 3 compartments but predominately medially where there were areas of bone-on-bone changes.  Has recurrent effusion and oftentimes will have a limp.  Like to consider options other than surgery if possible.  He has had a prior right total knee replacement is doing very well.  Prior films note decrease in both the medial  lateral joint spaces with subchondral sclerosis and small peripheral osteophytes.  ( November 2018)  HPI  Review of Systems  Constitutional: Negative for fatigue and fever.  HENT: Negative for ear pain.   Eyes: Negative for pain.  Respiratory: Negative for cough and shortness of breath.   Cardiovascular: Negative for leg swelling.  Gastrointestinal: Negative for constipation and diarrhea.  Genitourinary: Negative for difficulty urinating.  Musculoskeletal: Negative for back pain and neck pain.  Skin: Negative for rash.  Allergic/Immunologic: Negative for food allergies.  Neurological: Positive for weakness. Negative for numbness.  Hematological: Bruises/bleeds easily.  Psychiatric/Behavioral: Positive for sleep disturbance.     Objective: Vital Signs: BP (!) 128/93 (BP Location: Right Arm, Patient Position: Sitting, Cuff Size: Normal)   Pulse 81   Resp 16   Ht 5\' 11"  (1.803 m)   Wt 265 lb (120.2 kg)   BMI 36.96 kg/m   Physical Exam  Constitutional: He is oriented to person, place, and time. He appears well-developed and well-nourished.  HENT:  Mouth/Throat: Oropharynx is clear and moist.  Eyes: Pupils are equal, round, and reactive to light. EOM are normal.  Pulmonary/Chest: Effort normal.  Neurological: He is alert and oriented to person, place, and time.  Skin: Skin is warm and dry.  Psychiatric: He has a normal mood and affect. His  behavior is normal.    Ortho Exam awake alert and oriented x3.  Comfortable sitting.  Does walk with a limp referable to his left knee.  No effusion.  The knee was not hot warm or red.  Full extension flexion about no instability.  Predominantly medial joint pain.  Arthroscopic portals of healed.  No calf pain.  No popliteal mass.  No pain range of motion left hip.  Straight leg raise negative.  Specialty Comments:  No specialty comments available.  Imaging: No results found.   PMFS History: Patient Active Problem List   Diagnosis  Date Noted  . PVC's (premature ventricular contractions)   . Dyspnea 12/06/2016  . Osteoarthritis of right hip 09/22/2015  . S/P total hip arthroplasty 09/22/2015  . Obesity 05/08/2014  . Osteoarthritis of knee 05/08/2014  . S/P total knee replacement using cement 05/06/2014  . Knee pain 04/08/2014  . Preoperative clearance 04/08/2014  . Coronary artery disease   . Diastolic dysfunction   . Hypertension   . Orthostatic hypotension   . Hypercholesterolemia    Past Medical History:  Diagnosis Date  . Arthritis    "joints; shoulders; left elbow; right thumb" (05/06/2014)  . Cataracts, bilateral    immature  . Constipation    takes Colace and Metamucil daily  . Coronary artery disease 02/2011   cath 11/2016 showing 20% RCA, 50% OM, 35% mid LCx, 70% D1, 65% mid LAD, 70% distal LAD 80% on medical management  . Diastolic dysfunction   . Heart murmur   . Hemorrhoids   . History of bronchitis   . Hypercholesterolemia    takes Crestor daily  . Hypertension   . Joint pain   . Nocturia   . Orthostatic hypotension   . PVC's (premature ventricular contractions)    PVC load 0.2% by Holter 12/2016  . Sciatica     Family History  Problem Relation Age of Onset  . CVA Mother   . CVA Father   . Heart disease Father     Past Surgical History:  Procedure Laterality Date  . ACHILLES TENDON REPAIR Left    "& fixed a spur"  . CARDIAC CATHETERIZATION  2012  . CARDIAC CATHETERIZATION N/A 12/06/2016   Procedure: Left Heart Cath and Coronary Angiography;  Surgeon: Belva Crome, MD;  Location: [facility];  Service: Cardiovascular;  Laterality: N/A;  . COLONOSCOPY    . FLEXIBLE SIGMOIDOSCOPY  X 2  . HAND SURGERY Left    "thumb; cleaned out arthritis"  . HEEL SPUR EXCISION Left   . KNEE ARTHROSCOPY Right   . SHOULDER SURGERY Right X 2   "cleaned out spurs"  . TOE FUSION Right    great toe; plates and screws  . TONSILLECTOMY  ~ 1950  . TOTAL HIP ARTHROPLASTY Right 09/22/2015    Procedure: TOTAL HIP ARTHROPLASTY;  Surgeon: Garald Balding, MD;  Location: [facility];  Service: Orthopedics;  Laterality: Right;  . TOTAL KNEE ARTHROPLASTY Right 05/06/2014  . TOTAL KNEE ARTHROPLASTY Right 05/06/2014   Procedure: RIGHT TOTAL KNEE ARTHROPLASTY;  Surgeon: Garald Balding, MD;  Location: [facility];  Service: Orthopedics;  Laterality: Right;   Social History   Occupational History  . Not on file  Tobacco Use  . Smoking status: Former Smoker    Packs/day: 2.00    Years: 10.00    Pack years: 20.00    Types: Cigarettes  . Smokeless tobacco: Never Used  . Tobacco comment: quit smoking in 1981  Substance and Sexual Activity  . Alcohol use: Yes    Comment: "drink a beer sometimes once/month"  . Drug use: No  . Sexual activity: Never

## 2018-05-01 NOTE — Telephone Encounter (Signed)
Please include what product the doctor would like to use for viscosupplementation.

## 2018-05-01 NOTE — Telephone Encounter (Signed)
If the patient has never had visco, Euflexxa is the first choice, Hyalgan the second choice and if there is something that is preferred Syvisc One or monovisc would be the next choice for both providers. Thank you so much.

## 2018-05-01 NOTE — Telephone Encounter (Signed)
-----   Message from Shona Needles, RT sent at 05/01/2018 10:53 AM EDT ----- Regarding: VISCO LEFT KNEE Please order viscosupplementation for left knee for Dr. Durward Fortes. Thank you.

## 2018-05-02 ENCOUNTER — Telehealth (INDEPENDENT_AMBULATORY_CARE_PROVIDER_SITE_OTHER): Payer: Self-pay

## 2018-05-02 NOTE — Telephone Encounter (Signed)
Submitted application online for Euflexxa injection series, Left Knee.

## 2018-05-09 DIAGNOSIS — L821 Other seborrheic keratosis: Secondary | ICD-10-CM | POA: Diagnosis not present

## 2018-05-09 DIAGNOSIS — B09 Unspecified viral infection characterized by skin and mucous membrane lesions: Secondary | ICD-10-CM | POA: Diagnosis not present

## 2018-05-09 DIAGNOSIS — M79672 Pain in left foot: Secondary | ICD-10-CM | POA: Diagnosis not present

## 2018-05-21 ENCOUNTER — Encounter (INDEPENDENT_AMBULATORY_CARE_PROVIDER_SITE_OTHER): Payer: Self-pay | Admitting: Orthopaedic Surgery

## 2018-06-08 ENCOUNTER — Encounter (INDEPENDENT_AMBULATORY_CARE_PROVIDER_SITE_OTHER): Payer: Self-pay | Admitting: Radiology

## 2018-06-08 NOTE — Progress Notes (Signed)
Can you please call patient and make sure he knows that his Euflexxa injections will be covered by insurance at 100%.  He has an appt on 06/14/18 and I am sure he could start injections then if you can schedule his next two appts?   Please make sure he also knows we are able to buy and bill, he does not need to get these from the specialty pharmacy.  I think there is a previous note in chart regarding that.   Thanks-

## 2018-06-08 NOTE — Telephone Encounter (Signed)
Noted. Completed.  

## 2018-06-11 ENCOUNTER — Telehealth (INDEPENDENT_AMBULATORY_CARE_PROVIDER_SITE_OTHER): Payer: Self-pay | Admitting: Orthopaedic Surgery

## 2018-06-11 NOTE — Telephone Encounter (Signed)
A representative from [facility] left a voicemail stating they need a new prescription for the Gel 1.  Please call [phone] or fax prescription to  [phone]

## 2018-06-12 ENCOUNTER — Other Ambulatory Visit (INDEPENDENT_AMBULATORY_CARE_PROVIDER_SITE_OTHER): Payer: Self-pay | Admitting: Radiology

## 2018-06-12 MED ORDER — DICLOFENAC SODIUM 1 % TD GEL: 2.0000 g | Freq: Four times a day (QID) | TRANSDERMAL | 1 refills | Status: DC

## 2018-06-12 NOTE — Telephone Encounter (Signed)
Please send RX. Thank you.

## 2018-06-12 NOTE — Telephone Encounter (Signed)
Could not call toll free number so sent in new rx to pharmacy. Called pt to notify rx was sent in.

## 2018-06-13 NOTE — Progress Notes (Signed)
I LMOM for patient to call, and schedule for Euflexxa injections lt knee B/B.

## 2018-06-14 ENCOUNTER — Ambulatory Visit (INDEPENDENT_AMBULATORY_CARE_PROVIDER_SITE_OTHER): Payer: Medicare Other | Admitting: Orthopaedic Surgery

## 2018-06-14 DIAGNOSIS — Z570 Occupational exposure to noise: Secondary | ICD-10-CM | POA: Diagnosis not present

## 2018-06-14 DIAGNOSIS — H903 Sensorineural hearing loss, bilateral: Secondary | ICD-10-CM | POA: Diagnosis not present

## 2018-06-16 ENCOUNTER — Other Ambulatory Visit: Payer: Self-pay | Admitting: Cardiology

## 2018-06-25 ENCOUNTER — Ambulatory Visit (INDEPENDENT_AMBULATORY_CARE_PROVIDER_SITE_OTHER): Payer: Medicare Other | Admitting: Orthopaedic Surgery

## 2018-06-25 ENCOUNTER — Encounter (INDEPENDENT_AMBULATORY_CARE_PROVIDER_SITE_OTHER): Payer: Self-pay | Admitting: Orthopaedic Surgery

## 2018-06-25 VITALS — BP 138/96 | HR 78 | Ht 71.0 in | Wt 270.0 lb

## 2018-06-25 DIAGNOSIS — M1712 Unilateral primary osteoarthritis, left knee: Secondary | ICD-10-CM

## 2018-06-25 MED ORDER — SODIUM HYALURONATE (VISCOSUP) 20 MG/2ML IX SOSY
20.0000 mg | PREFILLED_SYRINGE | INTRA_ARTICULAR | Status: AC | PRN
  Administered 2018-06-25: 20 mg via INTRA_ARTICULAR

## 2018-06-25 NOTE — Progress Notes (Signed)
Office Visit Note   Patient: Christian [name]           Date of Birth: 1946/12/07           MRN: [id number] Visit Date: 06/25/2018              Requested by: Alroy Dust, L.Marlou Sa, [place] [address] [place], [place] [address] PCP: Alroy Dust, L.Marlou Sa, MD   Assessment & Plan: Visit Diagnoses:  1. Primary osteoarthritis of left knee     Plan: First Euflexxa injection left knee today.  Follow-up weekly for the next 2 weeks to complete the series  Follow-Up Instructions: Return in about 1 week (around 07/02/2018).   Orders:  No orders of the defined types were placed in this encounter.  No orders of the defined types were placed in this encounter.     Procedures: Large Joint Inj: L knee on 06/25/2018 8:12 AM Indications: pain and joint swelling Details: 25 G 1.5 in needle, anteromedial approach  Arthrogram: No  Medications: 20 mg Sodium Hyaluronate 20 MG/2ML Outcome: tolerated well, no immediate complications Procedure, treatment alternatives, risks and benefits explained, specific risks discussed. Consent was given by the patient. Immediately prior to procedure a time out was called to verify the correct patient, procedure, equipment, support staff and site/side marked as required. Patient was prepped and draped in the usual sterile fashion.       Clinical Data: No additional findings.   Subjective: Chief Complaint  Patient presents with  . Follow-up    #1 EUFLEXXA L KNEE INJECTION    HPI  Review of Systems  Constitutional: Negative for fatigue and fever.  HENT: Negative for ear pain.   Eyes: Negative for pain.  Respiratory: Negative for cough and shortness of breath.   Cardiovascular: Negative for leg swelling.  Gastrointestinal: Negative for constipation and diarrhea.  Genitourinary: Negative for difficulty urinating.  Musculoskeletal: Negative for back pain and neck pain.  Skin: Negative for rash.  Neurological: Negative for weakness.    Hematological: Bruises/bleeds easily.  Psychiatric/Behavioral: Negative for sleep disturbance.     Objective: Vital Signs: BP (!) 138/96 (BP Location: Right Arm, Patient Position: Sitting, Cuff Size: Normal)   Pulse 78   Ht 5\' 11"  (1.803 m)   Wt 270 lb (122.5 kg)   BMI 37.66 kg/m   Physical Exam  Ortho Exam left knee was not hot warm red or swollen.  No instability.  Lacks a few degrees to full extension.  Small effusion  Specialty Comments:  No specialty comments available.  Imaging: No results found.   PMFS History: Patient Active Problem List   Diagnosis Date Noted  . PVC's (premature ventricular contractions)   . Dyspnea 12/06/2016  . Osteoarthritis of right hip 09/22/2015  . S/P total hip arthroplasty 09/22/2015  . Obesity 05/08/2014  . Osteoarthritis of knee 05/08/2014  . S/P total knee replacement using cement 05/06/2014  . Knee pain 04/08/2014  . Preoperative clearance 04/08/2014  . Coronary artery disease   . Diastolic dysfunction   . Hypertension   . Orthostatic hypotension   . Hypercholesterolemia    Past Medical History:  Diagnosis Date  . Arthritis    "joints; shoulders; left elbow; right thumb" (05/06/2014)  . Cataracts, bilateral    immature  . Constipation    takes Colace and Metamucil daily  . Coronary artery disease 02/2011   cath 11/2016 showing 20% RCA, 50% OM, 35% mid LCx, 70% D1, 65% mid LAD, 70% distal LAD 80% on  medical management  . Diastolic dysfunction   . Heart murmur   . Hemorrhoids   . History of bronchitis   . Hypercholesterolemia    takes Crestor daily  . Hypertension   . Joint pain   . Nocturia   . Orthostatic hypotension   . PVC's (premature ventricular contractions)    PVC load 0.2% by Holter 12/2016  . Sciatica     Family History  Problem Relation Age of Onset  . CVA Mother   . CVA Father   . Heart disease Father     Past Surgical History:  Procedure Laterality Date  . ACHILLES TENDON REPAIR Left    "&  fixed a spur"  . CARDIAC CATHETERIZATION  2012  . CARDIAC CATHETERIZATION N/A 12/06/2016   Procedure: Left Heart Cath and Coronary Angiography;  Surgeon: Belva Crome, MD;  Location: [facility];  Service: Cardiovascular;  Laterality: N/A;  . COLONOSCOPY    . FLEXIBLE SIGMOIDOSCOPY  X 2  . HAND SURGERY Left    "thumb; cleaned out arthritis"  . HEEL SPUR EXCISION Left   . KNEE ARTHROSCOPY Right   . SHOULDER SURGERY Right X 2   "cleaned out spurs"  . TOE FUSION Right    great toe; plates and screws  . TONSILLECTOMY  ~ 1950  . TOTAL HIP ARTHROPLASTY Right 09/22/2015   Procedure: TOTAL HIP ARTHROPLASTY;  Surgeon: Garald Balding, MD;  Location: [facility];  Service: Orthopedics;  Laterality: Right;  . TOTAL KNEE ARTHROPLASTY Right 05/06/2014  . TOTAL KNEE ARTHROPLASTY Right 05/06/2014   Procedure: RIGHT TOTAL KNEE ARTHROPLASTY;  Surgeon: Garald Balding, MD;  Location: [facility];  Service: Orthopedics;  Laterality: Right;   Social History   Occupational History  . Not on file  Tobacco Use  . Smoking status: Former Smoker    Packs/day: 2.00    Years: 10.00    Pack years: 20.00    Types: Cigarettes  . Smokeless tobacco: Never Used  . Tobacco comment: quit smoking in 1981  Substance and Sexual Activity  . Alcohol use: Yes    Comment: "drink a beer sometimes once/month"  . Drug use: No  . Sexual activity: Never

## 2018-06-26 DIAGNOSIS — H903 Sensorineural hearing loss, bilateral: Secondary | ICD-10-CM | POA: Diagnosis not present

## 2018-07-02 ENCOUNTER — Encounter (INDEPENDENT_AMBULATORY_CARE_PROVIDER_SITE_OTHER): Payer: Self-pay | Admitting: Orthopaedic Surgery

## 2018-07-02 ENCOUNTER — Ambulatory Visit (INDEPENDENT_AMBULATORY_CARE_PROVIDER_SITE_OTHER): Payer: Medicare Other | Admitting: Orthopaedic Surgery

## 2018-07-02 VITALS — BP 107/78 | HR 83 | Ht 71.0 in | Wt 270.0 lb

## 2018-07-02 DIAGNOSIS — M1712 Unilateral primary osteoarthritis, left knee: Secondary | ICD-10-CM | POA: Diagnosis not present

## 2018-07-02 MED ORDER — SODIUM HYALURONATE (VISCOSUP) 20 MG/2ML IX SOSY
20.0000 mg | PREFILLED_SYRINGE | INTRA_ARTICULAR | Status: AC | PRN
  Administered 2018-07-02: 20 mg via INTRA_ARTICULAR

## 2018-07-02 NOTE — Progress Notes (Signed)
Office Visit Note   Patient: Christian [name]           Date of Birth: 1946-05-11           MRN: [id number] Visit Date: 07/02/2018              Requested by: Alroy Dust, L.Marlou Sa, [place] [address] [place], [place] [address] PCP: Alroy Dust, L.Marlou Sa, MD   Assessment & Plan: Visit Diagnoses:  1. Primary osteoarthritis of left knee     Plan: Second Euflexxa injection left knee.  Return in 1 week to complete series  Follow-Up Instructions: Return in about 1 week (around 07/09/2018).   Orders:  Orders Placed This Encounter  Procedures  . Large Joint Inj: L knee   No orders of the defined types were placed in this encounter.     Procedures: Large Joint Inj: L knee on 07/02/2018 9:01 AM Indications: pain and joint swelling Details: 25 G 1.5 in needle, anteromedial approach  Arthrogram: No  Medications: 20 mg Sodium Hyaluronate 20 MG/2ML Outcome: tolerated well, no immediate complications Procedure, treatment alternatives, risks and benefits explained, specific risks discussed. Consent was given by the patient. Immediately prior to procedure a time out was called to verify the correct patient, procedure, equipment, support staff and site/side marked as required. Patient was prepped and draped in the usual sterile fashion.       Clinical Data: No additional findings.   Subjective: Chief Complaint  Patient presents with  . Follow-up    # 2 EUFLEXXA L KNEE INJECTION  No related problems with first injection  HPI  Review of Systems  Constitutional: Negative for diaphoresis and fatigue.  HENT: Negative for ear pain.   Eyes: Negative for pain.  Respiratory: Negative for cough and shortness of breath.   Cardiovascular: Negative for leg swelling.  Gastrointestinal: Negative for constipation and diarrhea.  Genitourinary: Negative for difficulty urinating.  Musculoskeletal: Negative for back pain and neck pain.  Skin: Negative for rash.  Allergic/Immunologic:  Negative for food allergies.  Hematological: Bruises/bleeds easily.  Psychiatric/Behavioral: Negative for sleep disturbance.     Objective: Vital Signs: BP 107/78 (BP Location: Left Arm, Patient Position: Sitting, Cuff Size: Normal)   Pulse 83   Ht 5\' 11"  (1.803 m)   Wt 270 lb (122.5 kg)   BMI 37.66 kg/m   Physical Exam  Ortho Exam left knee not hot red or swollen.  Walks without a limp.  Mild medial joint pain.  No effusion  Specialty Comments:  No specialty comments available.  Imaging: No results found.   PMFS History: Patient Active Problem List   Diagnosis Date Noted  . PVC's (premature ventricular contractions)   . Dyspnea 12/06/2016  . Osteoarthritis of right hip 09/22/2015  . S/P total hip arthroplasty 09/22/2015  . Obesity 05/08/2014  . Osteoarthritis of knee 05/08/2014  . S/P total knee replacement using cement 05/06/2014  . Knee pain 04/08/2014  . Preoperative clearance 04/08/2014  . Coronary artery disease   . Diastolic dysfunction   . Hypertension   . Orthostatic hypotension   . Hypercholesterolemia    Past Medical History:  Diagnosis Date  . Arthritis    "joints; shoulders; left elbow; right thumb" (05/06/2014)  . Cataracts, bilateral    immature  . Constipation    takes Colace and Metamucil daily  . Coronary artery disease 02/2011   cath 11/2016 showing 20% RCA, 50% OM, 35% mid LCx, 70% D1, 65% mid LAD, 70% distal LAD 80% on  medical management  . Diastolic dysfunction   . Heart murmur   . Hemorrhoids   . History of bronchitis   . Hypercholesterolemia    takes Crestor daily  . Hypertension   . Joint pain   . Nocturia   . Orthostatic hypotension   . PVC's (premature ventricular contractions)    PVC load 0.2% by Holter 12/2016  . Sciatica     Family History  Problem Relation Age of Onset  . CVA Mother   . CVA Father   . Heart disease Father     Past Surgical History:  Procedure Laterality Date  . ACHILLES TENDON REPAIR Left    "&  fixed a spur"  . CARDIAC CATHETERIZATION  2012  . CARDIAC CATHETERIZATION N/A 12/06/2016   Procedure: Left Heart Cath and Coronary Angiography;  Surgeon: Belva Crome, MD;  Location: [facility];  Service: Cardiovascular;  Laterality: N/A;  . COLONOSCOPY    . FLEXIBLE SIGMOIDOSCOPY  X 2  . HAND SURGERY Left    "thumb; cleaned out arthritis"  . HEEL SPUR EXCISION Left   . KNEE ARTHROSCOPY Right   . SHOULDER SURGERY Right X 2   "cleaned out spurs"  . TOE FUSION Right    great toe; plates and screws  . TONSILLECTOMY  ~ 1950  . TOTAL HIP ARTHROPLASTY Right 09/22/2015   Procedure: TOTAL HIP ARTHROPLASTY;  Surgeon: Garald Balding, MD;  Location: [facility];  Service: Orthopedics;  Laterality: Right;  . TOTAL KNEE ARTHROPLASTY Right 05/06/2014  . TOTAL KNEE ARTHROPLASTY Right 05/06/2014   Procedure: RIGHT TOTAL KNEE ARTHROPLASTY;  Surgeon: Garald Balding, MD;  Location: [facility];  Service: Orthopedics;  Laterality: Right;   Social History   Occupational History  . Not on file  Tobacco Use  . Smoking status: Former Smoker    Packs/day: 2.00    Years: 10.00    Pack years: 20.00    Types: Cigarettes  . Smokeless tobacco: Never Used  . Tobacco comment: quit smoking in 1981  Substance and Sexual Activity  . Alcohol use: Yes    Comment: "drink a beer sometimes once/month"  . Drug use: No  . Sexual activity: Never

## 2018-07-09 ENCOUNTER — Ambulatory Visit (INDEPENDENT_AMBULATORY_CARE_PROVIDER_SITE_OTHER): Payer: Medicare Other | Admitting: Orthopaedic Surgery

## 2018-07-09 ENCOUNTER — Encounter (INDEPENDENT_AMBULATORY_CARE_PROVIDER_SITE_OTHER): Payer: Self-pay | Admitting: Orthopaedic Surgery

## 2018-07-09 VITALS — BP 121/91 | HR 73 | Ht 71.0 in | Wt 270.0 lb

## 2018-07-09 DIAGNOSIS — M1712 Unilateral primary osteoarthritis, left knee: Secondary | ICD-10-CM | POA: Diagnosis not present

## 2018-07-09 MED ORDER — SODIUM HYALURONATE (VISCOSUP) 20 MG/2ML IX SOSY
20.0000 mg | PREFILLED_SYRINGE | INTRA_ARTICULAR | Status: AC | PRN
  Administered 2018-07-09: 20 mg via INTRA_ARTICULAR

## 2018-07-09 NOTE — Progress Notes (Signed)
Office Visit Note   Patient: Christian [name]           Date of Birth: June 19, 1946           MRN: [id number] Visit Date: 07/09/2018              Requested by: Alroy Dust, L.Marlou Sa, [place] [address] [place], [place] [address] PCP: Alroy Dust, L.Marlou Sa, MD   Assessment & Plan: Visit Diagnoses:  1. Primary osteoarthritis of left knee     Plan: Third Euflexxa injection left knee today.  Feeling a little better after the first 2 injections.  We will plan to see back as needed  Orders:  No orders of the defined types were placed in this encounter.  No orders of the defined types were placed in this encounter.     Procedures: Large Joint Inj: L knee on 07/09/2018 8:29 AM Indications: pain and joint swelling Details: 25 G 1.5 in needle, anteromedial approach  Arthrogram: No  Medications: 20 mg Sodium Hyaluronate 20 MG/2ML Outcome: tolerated well, no immediate complications Procedure, treatment alternatives, risks and benefits explained, specific risks discussed. Consent was given by the patient. Immediately prior to procedure a time out was called to verify the correct patient, procedure, equipment, support staff and site/side marked as required. Patient was prepped and draped in the usual sterile fashion.       Clinical Data: No additional findings.   Subjective: Chief Complaint  Patient presents with  . Follow-up    euflexxa # 3 L KNEE    HPI  Review of Systems   Objective: Vital Signs: BP (!) 121/91 (BP Location: Right Arm, Patient Position: Sitting, Cuff Size: Normal)   Pulse 73   Ht 5\' 11"  (1.803 m)   Wt 270 lb (122.5 kg)   BMI 37.66 kg/m   Physical Exam  Ortho Exam left knee not hot red warm swollen.  No effusion.  Walks with a limp Specialty Comments:  No specialty comments available.  Imaging: No results found.   PMFS History: Patient Active Problem List   Diagnosis Date Noted  . PVC's (premature ventricular contractions)   . Dyspnea  12/06/2016  . Osteoarthritis of right hip 09/22/2015  . S/P total hip arthroplasty 09/22/2015  . Obesity 05/08/2014  . Osteoarthritis of knee 05/08/2014  . S/P total knee replacement using cement 05/06/2014  . Knee pain 04/08/2014  . Preoperative clearance 04/08/2014  . Coronary artery disease   . Diastolic dysfunction   . Hypertension   . Orthostatic hypotension   . Hypercholesterolemia    Past Medical History:  Diagnosis Date  . Arthritis    "joints; shoulders; left elbow; right thumb" (05/06/2014)  . Cataracts, bilateral    immature  . Constipation    takes Colace and Metamucil daily  . Coronary artery disease 02/2011   cath 11/2016 showing 20% RCA, 50% OM, 35% mid LCx, 70% D1, 65% mid LAD, 70% distal LAD 80% on medical management  . Diastolic dysfunction   . Heart murmur   . Hemorrhoids   . History of bronchitis   . Hypercholesterolemia    takes Crestor daily  . Hypertension   . Joint pain   . Nocturia   . Orthostatic hypotension   . PVC's (premature ventricular contractions)    PVC load 0.2% by Holter 12/2016  . Sciatica     Family History  Problem Relation Age of Onset  . CVA Mother   . CVA Father   . Heart disease  Father     Past Surgical History:  Procedure Laterality Date  . ACHILLES TENDON REPAIR Left    "& fixed a spur"  . CARDIAC CATHETERIZATION  2012  . CARDIAC CATHETERIZATION N/A 12/06/2016   Procedure: Left Heart Cath and Coronary Angiography;  Surgeon: Belva Crome, MD;  Location: [facility];  Service: Cardiovascular;  Laterality: N/A;  . COLONOSCOPY    . FLEXIBLE SIGMOIDOSCOPY  X 2  . HAND SURGERY Left    "thumb; cleaned out arthritis"  . HEEL SPUR EXCISION Left   . KNEE ARTHROSCOPY Right   . SHOULDER SURGERY Right X 2   "cleaned out spurs"  . TOE FUSION Right    great toe; plates and screws  . TONSILLECTOMY  ~ 1950  . TOTAL HIP ARTHROPLASTY Right 09/22/2015   Procedure: TOTAL HIP ARTHROPLASTY;  Surgeon: Garald Balding, MD;   Location: [facility];  Service: Orthopedics;  Laterality: Right;  . TOTAL KNEE ARTHROPLASTY Right 05/06/2014  . TOTAL KNEE ARTHROPLASTY Right 05/06/2014   Procedure: RIGHT TOTAL KNEE ARTHROPLASTY;  Surgeon: Garald Balding, MD;  Location: [facility];  Service: Orthopedics;  Laterality: Right;   Social History   Occupational History  . Not on file  Tobacco Use  . Smoking status: Former Smoker    Packs/day: 2.00    Years: 10.00    Pack years: 20.00    Types: Cigarettes  . Smokeless tobacco: Never Used  . Tobacco comment: quit smoking in 1981  Substance and Sexual Activity  . Alcohol use: Yes    Comment: "drink a beer sometimes once/month"  . Drug use: No  . Sexual activity: Never     Garald Balding, MD   Note - This record has been created using [employer].  Chart creation errors have been sought, but may not always  have been located. Such creation errors do not reflect on  the standard of medical care.

## 2018-07-20 ENCOUNTER — Other Ambulatory Visit: Payer: Self-pay | Admitting: Cardiology

## 2018-07-20 DIAGNOSIS — I10 Essential (primary) hypertension: Secondary | ICD-10-CM

## 2018-08-07 DIAGNOSIS — E7800 Pure hypercholesterolemia, unspecified: Secondary | ICD-10-CM | POA: Diagnosis not present

## 2018-08-07 DIAGNOSIS — E669 Obesity, unspecified: Secondary | ICD-10-CM | POA: Diagnosis not present

## 2018-08-07 DIAGNOSIS — I10 Essential (primary) hypertension: Secondary | ICD-10-CM | POA: Diagnosis not present

## 2018-08-07 DIAGNOSIS — Z1159 Encounter for screening for other viral diseases: Secondary | ICD-10-CM | POA: Diagnosis not present

## 2018-08-07 DIAGNOSIS — M150 Primary generalized (osteo)arthritis: Secondary | ICD-10-CM | POA: Diagnosis not present

## 2018-08-07 DIAGNOSIS — I2510 Atherosclerotic heart disease of native coronary artery without angina pectoris: Secondary | ICD-10-CM | POA: Diagnosis not present

## 2018-09-07 ENCOUNTER — Other Ambulatory Visit: Payer: Self-pay | Admitting: Physician Assistant

## 2018-09-07 DIAGNOSIS — R0602 Shortness of breath: Secondary | ICD-10-CM

## 2018-09-12 ENCOUNTER — Other Ambulatory Visit: Payer: Self-pay | Admitting: Physician Assistant

## 2018-09-12 DIAGNOSIS — E7800 Pure hypercholesterolemia, unspecified: Secondary | ICD-10-CM

## 2018-09-27 ENCOUNTER — Telehealth: Payer: Self-pay | Admitting: Cardiology

## 2018-09-27 DIAGNOSIS — I2510 Atherosclerotic heart disease of native coronary artery without angina pectoris: Secondary | ICD-10-CM

## 2018-09-27 DIAGNOSIS — I10 Essential (primary) hypertension: Secondary | ICD-10-CM

## 2018-09-27 MED ORDER — HYDROCHLOROTHIAZIDE 25 MG PO TABS: 25.0000 mg | ORAL_TABLET | Freq: Every day | ORAL | 3 refills | Status: DC

## 2018-09-27 MED ORDER — LISINOPRIL 40 MG PO TABS: 40.0000 mg | ORAL_TABLET | Freq: Every day | ORAL | 3 refills | Status: DC

## 2018-09-27 NOTE — Telephone Encounter (Signed)
New Message:     Pt c/o BP issue: STAT if pt c/o blurred vision, one-sided weakness or slurred speech  1. What are your last 5 BP readings? 174/103  2. Are you having any other symptoms (ex. Dizziness, headache, blurred vision, passed out)? No   3. What is your BP issue? High B/P

## 2018-09-27 NOTE — Telephone Encounter (Signed)
Spoke with the patient, he has been having episodes of high blood pressure. His most recent after BP medication was 174/103. Spoke with Dr. Marlou Porch (DOD). He advised the patient increase lisinopril to 40 mg daily, start HCTZ 25 mg, daily and a bmet in a week. The patient agreed to these changes and accepted a lab appointment on 10/10.

## 2018-10-01 ENCOUNTER — Other Ambulatory Visit: Payer: Self-pay

## 2018-10-01 ENCOUNTER — Telehealth: Payer: Self-pay | Admitting: Cardiology

## 2018-10-01 ENCOUNTER — Emergency Department (HOSPITAL_BASED_OUTPATIENT_CLINIC_OR_DEPARTMENT_OTHER)
Admission: EM | Admit: 2018-10-01 | Discharge: 2018-10-01 | Disposition: A | Discharge status: Home / Self Care | Payer: Medicare Other | Attending: Emergency Medicine | Admitting: Emergency Medicine

## 2018-10-01 ENCOUNTER — Emergency Department (HOSPITAL_BASED_OUTPATIENT_CLINIC_OR_DEPARTMENT_OTHER): Payer: Medicare Other

## 2018-10-01 ENCOUNTER — Encounter (HOSPITAL_BASED_OUTPATIENT_CLINIC_OR_DEPARTMENT_OTHER): Payer: Self-pay | Admitting: *Deleted

## 2018-10-01 DIAGNOSIS — I959 Hypotension, unspecified: Secondary | ICD-10-CM | POA: Diagnosis not present

## 2018-10-01 DIAGNOSIS — I952 Hypotension due to drugs: Secondary | ICD-10-CM | POA: Diagnosis not present

## 2018-10-01 DIAGNOSIS — Z7982 Long term (current) use of aspirin: Secondary | ICD-10-CM | POA: Diagnosis not present

## 2018-10-01 DIAGNOSIS — Z79899 Other long term (current) drug therapy: Secondary | ICD-10-CM | POA: Insufficient documentation

## 2018-10-01 DIAGNOSIS — I110 Hypertensive heart disease with heart failure: Secondary | ICD-10-CM | POA: Insufficient documentation

## 2018-10-01 DIAGNOSIS — E7800 Pure hypercholesterolemia, unspecified: Secondary | ICD-10-CM | POA: Insufficient documentation

## 2018-10-01 DIAGNOSIS — E860 Dehydration: Secondary | ICD-10-CM | POA: Diagnosis not present

## 2018-10-01 DIAGNOSIS — Z87891 Personal history of nicotine dependence: Secondary | ICD-10-CM | POA: Diagnosis not present

## 2018-10-01 DIAGNOSIS — R0789 Other chest pain: Secondary | ICD-10-CM | POA: Diagnosis not present

## 2018-10-01 DIAGNOSIS — I5032 Chronic diastolic (congestive) heart failure: Secondary | ICD-10-CM | POA: Diagnosis not present

## 2018-10-01 DIAGNOSIS — I2510 Atherosclerotic heart disease of native coronary artery without angina pectoris: Secondary | ICD-10-CM | POA: Insufficient documentation

## 2018-10-01 DIAGNOSIS — R079 Chest pain, unspecified: Secondary | ICD-10-CM | POA: Diagnosis present

## 2018-10-01 LAB — CBC
HCT: 40.6 % (ref 39.0–52.0)
Hemoglobin: 14.4 g/dL (ref 13.0–17.0)
MCH: 30.8 pg (ref 26.0–34.0)
MCHC: 35.5 g/dL (ref 30.0–36.0)
MCV: 86.9 fL (ref 78.0–100.0)
PLATELETS: 234 10*3/uL (ref 150–400)
RBC: 4.67 MIL/uL (ref 4.22–5.81)
RDW: 13 % (ref 11.5–15.5)
WBC: 8.6 10*3/uL (ref 4.0–10.5)

## 2018-10-01 LAB — BASIC METABOLIC PANEL
Anion gap: 9 (ref 5–15)
BUN: 22 mg/dL (ref 8–23)
CHLORIDE: 101 mmol/L (ref 98–111)
CO2: 22 mmol/L (ref 22–32)
CREATININE: 1.84 mg/dL — AB (ref 0.61–1.24)
Calcium: 9 mg/dL (ref 8.9–10.3)
GFR calc Af Amer: 41 mL/min — ABNORMAL LOW (ref >60)
GFR calc non Af Amer: 35 mL/min — ABNORMAL LOW (ref >60)
Glucose, Bld: 115 mg/dL — ABNORMAL HIGH (ref 70–99)
Potassium: 3.3 mmol/L — ABNORMAL LOW (ref 3.5–5.1)
Sodium: 132 mmol/L — ABNORMAL LOW (ref 135–145)

## 2018-10-01 LAB — TROPONIN I
Troponin I: <0.03 ng/mL (ref <0.03)
Troponin I: <0.03 ng/mL (ref <0.03)

## 2018-10-01 MED ORDER — SODIUM CHLORIDE 0.9 % IV BOLUS
1000.0000 mL | Freq: Once | INTRAVENOUS | Status: AC
  Administered 2018-10-01: 1000 mL via INTRAVENOUS

## 2018-10-01 MED ORDER — SODIUM CHLORIDE 0.9 % IV BOLUS
1000.0000 mL | Freq: Once | INTRAVENOUS | Status: AC
Start: 2018-10-01 — End: 2018-10-01
  Administered 2018-10-01: 1000 mL via INTRAVENOUS

## 2018-10-01 NOTE — Telephone Encounter (Signed)
Spoke with the patient, he has been having very low blood pressures 72/49, 84/53 for the past few days. The patient is very fatigued. Spoke with [name], she advised he go to the hospital to get fluids. He accepted and will go to the [facility].

## 2018-10-01 NOTE — ED Triage Notes (Signed)
Chest pressure x 2 days. Hypotension. Pale.

## 2018-10-01 NOTE — ED Notes (Signed)
Pt. In radiology at this time

## 2018-10-01 NOTE — ED Provider Notes (Addendum)
[facility] EMERGENCY DEPARTMENT Provider Note   CSN: [id number] Arrival date & time: 10/01/18  1300     History   Chief Complaint Chief Complaint  Patient presents with  . Chest Pain    HPI Christian [name] is a 72 y.o. male.  Pt presents to the ED today with CP.  The pt also c/o low BP.  Pt said his bp was elevated last week, so [name] increased his Lisinopril from 20 to 40 and added hctz 25 mg on 10/3.  For the last few days, pt has noted that his bp has been very low.  Lowest was sbp 69.  The pt feels dizzy and fatigued.  He has been under a lot of stress lately.  He and his wife are moving into his daughter and son-in-law's house.  He has had occasional cp.  He has continued to take new bp meds, but decreased his lisinopril down to 30 today.       Past Medical History:  Diagnosis Date  . Arthritis    "joints; shoulders; left elbow; right thumb" (05/06/2014)  . Cataracts, bilateral    immature  . Constipation    takes Colace and Metamucil daily  . Coronary artery disease 02/2011   cath 11/2016 showing 20% RCA, 50% OM, 35% mid LCx, 70% D1, 65% mid LAD, 70% distal LAD 80% on medical management  . Diastolic dysfunction   . Heart murmur   . Hemorrhoids   . History of bronchitis   . Hypercholesterolemia    takes Crestor daily  . Hypertension   . Joint pain   . Nocturia   . Orthostatic hypotension   . PVC's (premature ventricular contractions)    PVC load 0.2% by Holter 12/2016  . Sciatica     Patient Active Problem List   Diagnosis Date Noted  . PVC's (premature ventricular contractions)   . Dyspnea 12/06/2016  . Osteoarthritis of right hip 09/22/2015  . S/P total hip arthroplasty 09/22/2015  . Obesity 05/08/2014  . Osteoarthritis of knee 05/08/2014  . S/P total knee replacement using cement 05/06/2014  . Knee pain 04/08/2014  . Preoperative clearance 04/08/2014  . Coronary artery disease   . Diastolic dysfunction   . Hypertension   .  Orthostatic hypotension   . Hypercholesterolemia     Past Surgical History:  Procedure Laterality Date  . ACHILLES TENDON REPAIR Left    "& fixed a spur"  . CARDIAC CATHETERIZATION  2012  . CARDIAC CATHETERIZATION N/A 12/06/2016   Procedure: Left Heart Cath and Coronary Angiography;  Surgeon: Belva Crome, MD;  Location: [place] [facility];  Service: Cardiovascular;  Laterality: N/A;  . COLONOSCOPY    . FLEXIBLE SIGMOIDOSCOPY  X 2  . HAND SURGERY Left    "thumb; cleaned out arthritis"  . HEEL SPUR EXCISION Left   . KNEE ARTHROSCOPY Right   . SHOULDER SURGERY Right X 2   "cleaned out spurs"  . TOE FUSION Right    great toe; plates and screws  . TONSILLECTOMY  ~ 1950  . TOTAL HIP ARTHROPLASTY Right 09/22/2015   Procedure: TOTAL HIP ARTHROPLASTY;  Surgeon: Garald Balding, MD;  Location: [place];  Service: Orthopedics;  Laterality: Right;  . TOTAL KNEE ARTHROPLASTY Right 05/06/2014  . TOTAL KNEE ARTHROPLASTY Right 05/06/2014   Procedure: RIGHT TOTAL KNEE ARTHROPLASTY;  Surgeon: Garald Balding, MD;  Location: [place];  Service: Orthopedics;  Laterality: Right;        Home  Medications    Prior to Admission medications   Medication Sig Start Date End Date Taking? Authorizing Provider  aspirin 81 MG tablet Take 81 mg by mouth daily.   Yes [provider]  diclofenac sodium (VOLTAREN) 1 % GEL Apply 2-4 g topically 4 (four) times daily. 06/12/18  Yes Garald Balding, MD  docusate sodium (STOOL SOFTENER) 100 MG capsule Take 200 mg by mouth daily.   Yes [provider]  furosemide (LASIX) 20 MG tablet TAKE 1 TABLET BY MOUTH EVERY MONDAY, WEDNESDAY, FRIDAY 09/07/18  Yes Weaver, Scott T, PA-C  hydrochlorothiazide (HYDRODIURIL) 25 MG tablet Take 1 tablet (25 mg total) by mouth daily. 09/27/18 09/27/19 Yes Jerline Pain, MD  isosorbide mononitrate (IMDUR) 60 MG 24 hr tablet TAKE 1 TABLET(60 MG) BY MOUTH DAILY 07/20/18  Yes Turner, Eber Hong, MD  lisinopril (PRINIVIL,ZESTRIL)  40 MG tablet Take 1 tablet (40 mg total) by mouth daily. 09/27/18 09/27/19 Yes Jerline Pain, MD  rosuvastatin (CRESTOR) 20 MG tablet TAKE 1 TABLET(20 MG) BY MOUTH DAILY 09/12/18  Yes Richardson Dopp T, PA-C  amoxicillin (AMOXIL) 500 MG tablet Take 4 tablets 2 hours prior to any dental procedure 05/16/17   Garald Balding, MD  nitroGLYCERIN (NITROSTAT) 0.4 MG SL tablet PLACE 1 TABLET UNDER THE TONGUE EVERY 5 MINUTES AS NEEDED FOR CHEST PAIN 12/06/16   Imogene Burn, PA-C  oxyCODONE (OXY IR/ROXICODONE) 5 MG immediate release tablet TK 1 TO 2 TS PO Q  3 TO 4 H PRN P. MAX 12 TS IN 24 H 01/04/18   [provider]  traMADol (ULTRAM) 50 MG tablet Take 1-2 tablets(100mg  total) by mouth at bedtime. 12/29/17   Garald Balding, MD    Family History Family History  Problem Relation Age of Onset  . CVA Mother   . CVA Father   . Heart disease Father     Social History Social History   Tobacco Use  . Smoking status: Former Smoker    Packs/day: 2.00    Years: 10.00    Pack years: 20.00    Types: Cigarettes  . Smokeless tobacco: Never Used  . Tobacco comment: quit smoking in 1981  Substance Use Topics  . Alcohol use: Yes    Comment: "drink a beer sometimes once/month"  . Drug use: No     Allergies   Atorvastatin calcium [atorvastatin]; Simvastatin; and Pravastatin   Review of Systems Review of Systems  Cardiovascular: Positive for chest pain.  Neurological: Positive for dizziness.  All other systems reviewed and are negative.    Physical Exam Updated Vital Signs BP 106/72   Pulse 67   Temp 97.8 F (36.6 C) (Oral)   Resp 15   Ht 5\' 11"  (1.803 m)   Wt 122.5 kg   SpO2 98%   BMI 37.66 kg/m   Physical Exam  Constitutional: He is oriented to person, place, and time. He appears well-developed and well-nourished.  HENT:  Head: Normocephalic and atraumatic.  Eyes: Pupils are equal, round, and reactive to light. EOM are normal.  Neck: Normal range of motion. Neck  supple.  Cardiovascular: Normal rate, regular rhythm, intact distal pulses and normal pulses.  Pulmonary/Chest: Effort normal and breath sounds normal.  Abdominal: Soft. Bowel sounds are normal.  Musculoskeletal: Normal range of motion.       Right lower leg: Normal.       Left lower leg: Normal.  Neurological: He is alert and oriented to person, place, and time.  Skin: Skin is warm and dry. Capillary refill takes less than 2 seconds.  Psychiatric: He has a normal mood and affect. His behavior is normal.  Nursing note and vitals reviewed.    ED Treatments / Results  Labs (all labs ordered are listed, but only abnormal results are displayed) Labs Reviewed  BASIC METABOLIC PANEL - Abnormal; Notable for the following components:      Result Value   Sodium 132 (*)    Potassium 3.3 (*)    Glucose, Bld 115 (*)    Creatinine, Ser 1.84 (*)    GFR calc non Af Amer 35 (*)    GFR calc Af Amer 41 (*)    All other components within normal limits  CBC  TROPONIN I  TROPONIN I    EKG EKG Interpretation  Date/Time:  Monday October 01 2018 13:09:43 EDT Ventricular Rate:  86 PR Interval:  168 QRS Duration: 94 QT Interval:  354 QTC Calculation: 423 R Axis:   12 Text Interpretation:  Normal sinus rhythm Incomplete right bundle branch block Borderline ECG No old tracing to compare Confirmed by Isla Pence [phone]) on 10/01/2018 1:20:41 PM   Radiology Dg Chest 2 View  Result Date: 10/01/2018 CLINICAL DATA:  Pt c/o no energy, low BP, swimmy headed, dizzy when standing, all since Saturday, non smoker .HTN, CAD EXAM: CHEST - 2 VIEW COMPARISON:  12/20/2016 FINDINGS: Cardiac silhouette is normal in size and configuration. No mediastinal or hilar masses. No evidence of adenopathy. Clear lungs.  No pleural effusion or pneumothorax. No acute skeletal abnormality. IMPRESSION: No active cardiopulmonary disease. Electronically Signed   By: Lajean Manes M.D.   On: 10/01/2018 13:26     Procedures Procedures (including critical care time)  Medications Ordered in ED Medications  sodium chloride 0.9 % bolus 1,000 mL (1,000 mLs Intravenous New Bag/Given 10/01/18 1709)  sodium chloride 0.9 % bolus 1,000 mL (0 mLs Intravenous Stopped 10/01/18 1505)  sodium chloride 0.9 % bolus 1,000 mL ( Intravenous Stopped 10/01/18 1612)     Initial Impression / Assessment and Plan / ED Course  I have reviewed the triage vital signs and the nursing notes.  Pertinent labs & imaging results that were available during my care of the patient were reviewed by me and considered in my medical decision making (see chart for details).    Pt is feeling much better now that his bp is back to normal.  He has had 2 sets of negative troponins.  He is instructed to hold lisinopril tomorrow, then go back to lisinopril 20 and also hold hctz.  Return if worse.  F/u with pcp.  CRITICAL CARE Performed by: Isla Pence   Total critical care time: 30 minutes  Critical care time was exclusive of separately billable procedures and treating other patients.  Critical care was necessary to treat or prevent imminent or life-threatening deterioration.  Critical care was time spent personally by me on the following activities: development of treatment plan with patient and/or surrogate as well as nursing, discussions with consultants, evaluation of patient's response to treatment, examination of patient, obtaining history from patient or surrogate, ordering and performing treatments and interventions, ordering and review of laboratory studies, ordering and review of radiographic studies, pulse oximetry and re-evaluation of patient's condition.  Final Clinical Impressions(s) / ED Diagnoses   Final diagnoses:  Hypotension due to drugs  Atypical chest pain  Dehydration    ED Discharge Orders    None       Beauregard Jarrells,  Almyra Free, MD 10/01/18 1745    Isla Pence, MD 10/17/18 [phone]

## 2018-10-01 NOTE — Telephone Encounter (Signed)
New Message:      Pt c/o BP issue: STAT if pt c/o blurred vision, one-sided weakness or slurred speech  1. What are your last 5 BP readings? 69/49  2. Are you having any other symptoms (ex. Dizziness, headache, blurred vision, passed out)? dizzines  3. What is your BP issue? Pt states a dosage of his BP medication was changed and a fluid pill was added and now he is having low BP readings.

## 2018-10-01 NOTE — Discharge Instructions (Signed)
Hold lisinopril tomorrow, then if BP > 120 start taking lisinopril 20 again.  Stop the HCTZ.

## 2018-10-03 ENCOUNTER — Encounter: Payer: Self-pay | Admitting: Cardiology

## 2018-10-03 ENCOUNTER — Ambulatory Visit (INDEPENDENT_AMBULATORY_CARE_PROVIDER_SITE_OTHER): Payer: Medicare Other | Admitting: Cardiology

## 2018-10-03 VITALS — BP 102/72 | HR 77 | Ht 71.0 in | Wt 264.8 lb

## 2018-10-03 DIAGNOSIS — Z23 Encounter for immunization: Secondary | ICD-10-CM

## 2018-10-03 DIAGNOSIS — I2510 Atherosclerotic heart disease of native coronary artery without angina pectoris: Secondary | ICD-10-CM | POA: Diagnosis not present

## 2018-10-03 DIAGNOSIS — I951 Orthostatic hypotension: Secondary | ICD-10-CM

## 2018-10-03 DIAGNOSIS — E7800 Pure hypercholesterolemia, unspecified: Secondary | ICD-10-CM | POA: Diagnosis not present

## 2018-10-03 DIAGNOSIS — R079 Chest pain, unspecified: Secondary | ICD-10-CM

## 2018-10-03 DIAGNOSIS — I10 Essential (primary) hypertension: Secondary | ICD-10-CM | POA: Diagnosis not present

## 2018-10-03 LAB — BASIC METABOLIC PANEL
BUN/Creatinine Ratio: 13 (ref 10–24)
BUN: 14 mg/dL (ref 8–27)
CALCIUM: 9.7 mg/dL (ref 8.6–10.2)
CHLORIDE: 98 mmol/L (ref 96–106)
CO2: 22 mmol/L (ref 20–29)
Creatinine, Ser: 1.12 mg/dL (ref 0.76–1.27)
GFR calc non Af Amer: 66 mL/min/{1.73_m2} (ref >59)
GFR, EST AFRICAN AMERICAN: 76 mL/min/{1.73_m2} (ref >59)
GLUCOSE: 98 mg/dL (ref 65–99)
Potassium: 4.2 mmol/L (ref 3.5–5.2)
Sodium: 137 mmol/L (ref 134–144)

## 2018-10-03 MED ORDER — LISINOPRIL 10 MG PO TABS: 10.0000 mg | ORAL_TABLET | Freq: Every day | ORAL | 3 refills | Status: DC

## 2018-10-03 NOTE — Patient Instructions (Addendum)
Medication Instructions:  Decrease to Lisinopril 10 mg daily  If you need a refill on your cardiac medications before your next appointment, please call your pharmacy.   Take blood pressures for 1 week, call the office with the results  Lab work: Today: BMET  If you have labs (blood work) drawn today and your tests are completely normal, you will receive your results only by: [name] Kitchen MyChart Message (if you have MyChart) OR . A paper copy in the mail If you have any lab test that is abnormal or we need to change your treatment, we will call you to review the results.  Testing/Procedures: Your physician has requested that you have an echocardiogram. Echocardiography is a painless test that uses sound waves to create images of your heart. It provides your doctor with information about the size and shape of your heart and how well your heart's chambers and valves are working. This procedure takes approximately one hour. There are no restrictions for this procedure.  Your physician has requested that you have a lexiscan myoview. For further information please visit [email or web address]. Please follow instruction sheet, as given.   Follow-Up: At [facility], you and your health needs are our priority.  As part of our continuing mission to provide you with exceptional heart care, we have created designated Provider Care Teams.  These Care Teams include your primary Cardiologist (physician) and Advanced Practice Providers (APPs -  Physician Assistants and Nurse Practitioners) who all work together to provide you with the care you need, when you need it.  You will need a follow up appointment in 6 months.  Please call our office 2 months in advance to schedule this appointment.  You may see [name] or one of the following Advanced Practice Providers on your designated Care Team:   [place], PA-C Melina Copa, PA-C . Ermalinda Barrios, PA-C

## 2018-10-03 NOTE — Progress Notes (Signed)
Cardiology Office Note:    Date:  10/03/2018   ID:  Christian [name], DOB 06/03/1946, MRN [id number]  PCP:  Aurea Graff.Marlou Sa, MD  Cardiologist:  No primary care provider on file.    Referring MD: Alroy Dust, L.Marlou Sa, MD   Chief Complaint  Patient presents with  . Coronary Artery Disease  . Chest Pain  . Hypertension  . Hyperlipidemia    History of Present Illness:    Christian [name] is a 72 y.o. male with a hx of ASCAD with cath 12/2017showing 20% RCA, 50% OM, 35% mid LCx, 70% D1, 65% mid LAD, 70% distal LADon medical management. He also was noted to have an elevated LVEDP at 20withEF 45-50% by cath and 50-55% by echo. He hasa history ofHTN and dyslipidemiaas well.  He presented to [facility] on 10/7with complaints of CP and low BP.  His BP had initially been elevated a week ago and BP Lisinopril was increased from 20 to 40mg  daily and HCTZ was added on 103.  The day of ER visit his SBP was 78mmHg.  He started to feel dizzy and fatigued.  He has also been having CP recently but has been under a lot of stress and is moving into his daughters house.    On arrival at [facility] med center his BP was 106/31mmHg and workup was remarkable for creatinine of 1.84 and K+ 3.3.  Trop was negative x 2.  He was instructed to hold ACE I for 1 day and then go back to Lisinopril 20mg  daily and stop HCTZ.  He is now back for followup and has been doing well since his ER visit.    He says that the CP is a pressure that is substernal with no radiation.  He occasionally will break out in sweat but bo nausea or SOB.  The pain is both exertional and nonexertional.  It is very episodic. EKG in ER was nonischemic.   Past Medical History:  Diagnosis Date  . Arthritis    "joints; shoulders; left elbow; right thumb" (05/06/2014)  . Cataracts, bilateral    immature  . Constipation    takes Colace and Metamucil daily  . Coronary artery disease 02/2011   cath 11/2016 showing 20% RCA, 50% OM, 35% mid LCx, 70%  D1, 65% mid LAD, 70% distal LAD 80% on medical management  . Diastolic dysfunction   . Heart murmur   . Hemorrhoids   . History of bronchitis   . Hypercholesterolemia    takes Crestor daily  . Hypertension   . Joint pain   . Nocturia   . Orthostatic hypotension   . PVC's (premature ventricular contractions)    PVC load 0.2% by Holter 12/2016  . Sciatica     Past Surgical History:  Procedure Laterality Date  . ACHILLES TENDON REPAIR Left    "& fixed a spur"  . CARDIAC CATHETERIZATION  2012  . CARDIAC CATHETERIZATION N/A 12/06/2016   Procedure: Left Heart Cath and Coronary Angiography;  Surgeon: Belva Crome, MD;  Location: [place] [facility];  Service: Cardiovascular;  Laterality: N/A;  . COLONOSCOPY    . FLEXIBLE SIGMOIDOSCOPY  X 2  . HAND SURGERY Left    "thumb; cleaned out arthritis"  . HEEL SPUR EXCISION Left   . KNEE ARTHROSCOPY Right   . SHOULDER SURGERY Right X 2   "cleaned out spurs"  . TOE FUSION Right    great toe; plates and screws  . TONSILLECTOMY  ~ 1950  .  TOTAL HIP ARTHROPLASTY Right 09/22/2015   Procedure: TOTAL HIP ARTHROPLASTY;  Surgeon: Garald Balding, MD;  Location: [facility];  Service: Orthopedics;  Laterality: Right;  . TOTAL KNEE ARTHROPLASTY Right 05/06/2014  . TOTAL KNEE ARTHROPLASTY Right 05/06/2014   Procedure: RIGHT TOTAL KNEE ARTHROPLASTY;  Surgeon: Garald Balding, MD;  Location: [place];  Service: Orthopedics;  Laterality: Right;    Current Medications: Current Meds  Medication Sig  . amoxicillin (AMOXIL) 500 MG tablet Take 4 tablets 2 hours prior to any dental procedure  . aspirin 81 MG tablet Take 81 mg by mouth daily.  [name] Kitchen docusate sodium (COLACE) 100 MG capsule Take 300 mg by mouth daily.  . furosemide (LASIX) 20 MG tablet TAKE 1 TABLET BY MOUTH EVERY MONDAY, WEDNESDAY, FRIDAY  . isosorbide mononitrate (IMDUR) 60 MG 24 hr tablet TAKE 1 TABLET(60 MG) BY MOUTH DAILY  . lisinopril (PRINIVIL,ZESTRIL) 40 MG tablet Take 1 tablet (40 mg total) by  mouth daily.  . nitroGLYCERIN (NITROSTAT) 0.4 MG SL tablet PLACE 1 TABLET UNDER THE TONGUE EVERY 5 MINUTES AS NEEDED FOR CHEST PAIN  . rosuvastatin (CRESTOR) 20 MG tablet TAKE 1 TABLET(20 MG) BY MOUTH DAILY     Allergies:   Atorvastatin calcium [atorvastatin]; Simvastatin; and Pravastatin   Social History   Socioeconomic History  . Marital status: Married    Spouse name: Not on file  . Number of children: Not on file  . Years of education: Not on file  . Highest education level: Not on file  Occupational History  . Not on file  Social Needs  . Financial resource strain: Not on file  . Food insecurity:    Worry: Not on file    Inability: Not on file  . Transportation needs:    Medical: Not on file    Non-medical: Not on file  Tobacco Use  . Smoking status: Former Smoker    Packs/day: 2.00    Years: 10.00    Pack years: 20.00    Types: Cigarettes  . Smokeless tobacco: Never Used  . Tobacco comment: quit smoking in 1981  Substance and Sexual Activity  . Alcohol use: Yes    Comment: "drink a beer sometimes once/month"  . Drug use: No  . Sexual activity: Never  Lifestyle  . Physical activity:    Days per week: Not on file    Minutes per session: Not on file  . Stress: Not on file  Relationships  . Social connections:    Talks on phone: Not on file    Gets together: Not on file    Attends religious service: Not on file    Active member of club or organization: Not on file    Attends meetings of clubs or organizations: Not on file    Relationship status: Not on file  Other Topics Concern  . Not on file  Social History Narrative  . Not on file     Family History: The patient's family history includes CVA in his father and mother; Heart disease in his father.  ROS:   Please see the history of present illness.    ROS  All other systems reviewed and negative.   EKGs/Labs/Other Studies Reviewed:    The following studies were reviewed today: Labs from Ef  visit  EKG:  EKG is not ordered today.   Recent Labs: 01/09/2018: ALT 7 10/01/2018: BUN 22; Creatinine, Ser 1.84; Hemoglobin 14.4; Platelets 234; Potassium 3.3; Sodium 132   Recent Lipid Panel  Component Value Date/Time   CHOL 114 01/09/2018 0825   TRIG 71 01/09/2018 0825   HDL 45 01/09/2018 0825   CHOLHDL 2.5 01/09/2018 0825   CHOLHDL 2.9 04/27/2016 0826   VLDL 14 04/27/2016 0826   LDLCALC 55 01/09/2018 0825    Physical Exam:    VS:  BP 102/72   Pulse 77   Ht 5\' 11"  (1.803 m)   Wt 264 lb 12.8 oz (120.1 kg)   SpO2 97%   BMI 36.93 kg/m    Orthostatic VS for the past 24 hrs:  BP- Lying Pulse- Lying BP- Sitting Pulse- Sitting BP- Standing at 0 minutes Pulse- Standing at 0 minutes  10/03/18 0847 125/87 70 98/75 81 100/64 90     Wt Readings from Last 3 Encounters:  10/03/18 264 lb 12.8 oz (120.1 kg)  10/01/18 270 lb (122.5 kg)  07/09/18 270 lb (122.5 kg)     GEN:  Well nourished, well developed in no acute distress HEENT: Normal NECK: No JVD; No carotid bruits LYMPHATICS: No lymphadenopathy CARDIAC: RRR, no murmurs, rubs, gallops RESPIRATORY:  Clear to auscultation without rales, wheezing or rhonchi  ABDOMEN: Soft, non-tender, non-distended MUSCULOSKELETAL:  No edema; No deformity  SKIN: Warm and dry NEUROLOGIC:  Alert and oriented x 3 PSYCHIATRIC:  Normal affect   ASSESSMENT:    1. Coronary artery disease involving native coronary artery of native heart without angina pectoris   2. Orthostatic hypotension   3. Essential hypertension   4. Hypercholesterolemia    PLAN:    In order of problems listed above:  1.  ASCAD - cath 12/2017show 20ed% RCA, 50% OM, 35% mid LCx, 70% D1, 65% mid LAD, 70% distal LADon medical management.  He has been having some episodes of CP which may be related to stress he is going through with move to his daughters house but could also be due to progression of CAD.  I will get a stress myoview to rule out ischemia and check 2D  echo.  Continue ASA 81mg  daily, Imdur 60mg  daily and statin.   2.  Orthostatic hypotension - recently had significant hypotension after increasing BP meds for some elevated BP readings.  Hypotension has resolved and he has not had any further dizzy spells but is still mildly orthostatic on exam.  I have recommended compression hose and will decrease Lisinopril to 10mg  daily.  I have asked him to check his BP daily for a week and call with results.   3.  HTN - BP is controlled on exam today.  I have instructed him to decrease Lisinopril to 10mg  daily due to orthostatic hypotension on exam today.   I will repeat BMET today.    4.  Hyperlipidemia - LDL goal is < 70.  He will continue on crestor 20mg  daily.     Medication Adjustments/Labs and Tests Ordered: Current medicines are reviewed at length with the patient today.  Concerns regarding medicines are outlined above.  No orders of the defined types were placed in this encounter.  No orders of the defined types were placed in this encounter.   Signed, Fransico Him, MD  10/03/2018 9:01 AM    [place]

## 2018-10-04 ENCOUNTER — Other Ambulatory Visit: Payer: Medicare Other

## 2018-10-08 ENCOUNTER — Other Ambulatory Visit: Payer: Medicare Other

## 2018-10-10 NOTE — Telephone Encounter (Signed)
Spoke with the patient, his blood pressure bounces from 96/64, 150/88, 95/65, 169/107, 168/107, 143/77 and 146/93. He currently is prescribed 10 mg of Lisinopril, but he has taken 20 mg when he notices the number is high. The patient does feel weak when he notices his blood pressure is low.   Sending to [name] for recommendations.

## 2018-10-11 ENCOUNTER — Telehealth (HOSPITAL_COMMUNITY): Payer: Self-pay | Admitting: *Deleted

## 2018-10-11 NOTE — Telephone Encounter (Signed)
Patient given detailed instructions per Myocardial Perfusion Study Information Sheet for the test on 10/16/18 Patient notified to arrive 15 minutes early and that it is imperative to arrive on time for appointment to keep from having the test rescheduled.  If you need to cancel or reschedule your appointment, please call the office within 24 hours of your appointment. . Patient verbalized understanding. Kirstie Peri

## 2018-10-15 ENCOUNTER — Telehealth: Payer: Self-pay

## 2018-10-15 MED ORDER — LISINOPRIL 20 MG PO TABS: 30.0000 mg | ORAL_TABLET | Freq: Every day | ORAL | 3 refills | Status: DC

## 2018-10-15 NOTE — Telephone Encounter (Addendum)
Spoke with the patient about increasing his Lisinopril to 30 mg and he should wear his compressions all day to help control his pressures. Patient will call in 1 week with BP.

## 2018-10-15 NOTE — Telephone Encounter (Signed)
-----   Message from Sueanne Margarita, MD sent at 10/14/2018  8:56 AM EDT ----- Increase Lisinopril to 30mg  daily and he needs to wear compression hose all day while up.  Please have him check his BP daily for a week and call with the results.   Traci ----- Message ----- From: Sarina Ill, RN Sent: 10/11/2018   8:24 AM EDT To: Sueanne Margarita, MD  Hello, His pressure are still up, he has been wearing his compression hose for the past 3 days, He does not wear it all day, but for around 4-5 hours. He felt good this morning, but took his blood pressure while on the phone, it is at 173/112. He has increased his lisinopril to 20 mg daily, by his choice.  Thanks, [employer]

## 2018-10-16 ENCOUNTER — Ambulatory Visit (HOSPITAL_BASED_OUTPATIENT_CLINIC_OR_DEPARTMENT_OTHER): Payer: Medicare Other

## 2018-10-16 ENCOUNTER — Other Ambulatory Visit: Payer: Self-pay

## 2018-10-16 ENCOUNTER — Ambulatory Visit (HOSPITAL_COMMUNITY): Payer: Medicare Other | Attending: Cardiology

## 2018-10-16 DIAGNOSIS — R079 Chest pain, unspecified: Secondary | ICD-10-CM

## 2018-10-16 LAB — MYOCARDIAL PERFUSION IMAGING
CHL CUP NUCLEAR SDS: 1
CHL CUP NUCLEAR SRS: 0
CSEPPHR: 75 {beats}/min
LV dias vol: 83 mL (ref 62–150)
LVSYSVOL: 36 mL
Rest HR: 51 {beats}/min
SSS: 1
TID: 1.04

## 2018-10-16 LAB — ECHOCARDIOGRAM COMPLETE
HEIGHTINCHES: 71 in
WEIGHTICAEL: 4224 [oz_av]

## 2018-10-16 MED ORDER — TECHNETIUM TC 99M TETROFOSMIN IV KIT
31.7000 | PACK | Freq: Once | INTRAVENOUS | Status: AC | PRN
  Administered 2018-10-16: 31.7 via INTRAVENOUS
  Filled 2018-10-16: qty 32

## 2018-10-16 MED ORDER — REGADENOSON 0.4 MG/5ML IV SOLN
0.4000 mg | Freq: Once | INTRAVENOUS | Status: AC
  Administered 2018-10-16: 0.4 mg via INTRAVENOUS

## 2018-10-16 MED ORDER — TECHNETIUM TC 99M TETROFOSMIN IV KIT
10.5000 | PACK | Freq: Once | INTRAVENOUS | Status: AC | PRN
  Administered 2018-10-16: 10.5 via INTRAVENOUS
  Filled 2018-10-16: qty 11

## 2018-10-17 ENCOUNTER — Telehealth: Payer: Self-pay | Admitting: Cardiology

## 2018-10-17 NOTE — Telephone Encounter (Signed)
New message   Patient is returning call for lexiscan results.

## 2018-10-17 NOTE — Telephone Encounter (Signed)
See result encounter

## 2018-10-29 DIAGNOSIS — I2510 Atherosclerotic heart disease of native coronary artery without angina pectoris: Secondary | ICD-10-CM

## 2018-10-29 DIAGNOSIS — I10 Essential (primary) hypertension: Secondary | ICD-10-CM

## 2018-10-30 MED ORDER — LISINOPRIL 40 MG PO TABS: 40.0000 mg | ORAL_TABLET | Freq: Every day | ORAL | 3 refills | Status: DC

## 2018-10-30 NOTE — Telephone Encounter (Signed)
Please have patient increase lisinopril to 40 mg daily. Please get a bmet in 1 week. Please have him check his blood pressure 2 hours after taking blood pressure medicines daily for a week and call with results.  Fransico Him, MD

## 2018-10-30 NOTE — Telephone Encounter (Signed)
Spoke with the patient, he accepted increasing lisinopril to 40 mg, daily and will come in for BMET on 11/13. He will call the office in 1 week with the results of his blood pressures.

## 2018-11-05 DIAGNOSIS — H40013 Open angle with borderline findings, low risk, bilateral: Secondary | ICD-10-CM | POA: Diagnosis not present

## 2018-11-05 DIAGNOSIS — H2513 Age-related nuclear cataract, bilateral: Secondary | ICD-10-CM | POA: Diagnosis not present

## 2018-11-05 DIAGNOSIS — H47323 Drusen of optic disc, bilateral: Secondary | ICD-10-CM | POA: Diagnosis not present

## 2018-11-05 DIAGNOSIS — H35033 Hypertensive retinopathy, bilateral: Secondary | ICD-10-CM | POA: Diagnosis not present

## 2018-11-07 ENCOUNTER — Other Ambulatory Visit: Payer: Medicare Other | Admitting: *Deleted

## 2018-11-07 DIAGNOSIS — I10 Essential (primary) hypertension: Secondary | ICD-10-CM | POA: Diagnosis not present

## 2018-11-07 DIAGNOSIS — I2510 Atherosclerotic heart disease of native coronary artery without angina pectoris: Secondary | ICD-10-CM | POA: Diagnosis not present

## 2018-11-07 LAB — BASIC METABOLIC PANEL
BUN/Creatinine Ratio: 11 (ref 10–24)
BUN: 12 mg/dL (ref 8–27)
CALCIUM: 8.9 mg/dL (ref 8.6–10.2)
CHLORIDE: 101 mmol/L (ref 96–106)
CO2: 23 mmol/L (ref 20–29)
Creatinine, Ser: 1.09 mg/dL (ref 0.76–1.27)
GFR calc non Af Amer: 68 mL/min/{1.73_m2} (ref >59)
GFR, EST AFRICAN AMERICAN: 79 mL/min/{1.73_m2} (ref >59)
GLUCOSE: 80 mg/dL (ref 65–99)
Potassium: 4.2 mmol/L (ref 3.5–5.2)
Sodium: 138 mmol/L (ref 134–144)

## 2018-12-15 ENCOUNTER — Other Ambulatory Visit: Payer: Self-pay | Admitting: Physician Assistant

## 2018-12-15 ENCOUNTER — Other Ambulatory Visit: Payer: Self-pay | Admitting: Cardiology

## 2018-12-15 DIAGNOSIS — E7800 Pure hypercholesterolemia, unspecified: Secondary | ICD-10-CM

## 2018-12-24 ENCOUNTER — Other Ambulatory Visit: Payer: Self-pay

## 2018-12-24 DIAGNOSIS — R0602 Shortness of breath: Secondary | ICD-10-CM

## 2018-12-24 MED ORDER — FUROSEMIDE 20 MG PO TABS: ORAL_TABLET | ORAL | 3 refills | Status: DC

## 2019-01-10 ENCOUNTER — Other Ambulatory Visit: Payer: Self-pay | Admitting: Cardiology

## 2019-01-10 DIAGNOSIS — I10 Essential (primary) hypertension: Secondary | ICD-10-CM

## 2019-01-11 NOTE — Telephone Encounter (Signed)
Spoke with the patient, he is going to bring in the clearance on Monday.

## 2019-01-14 ENCOUNTER — Ambulatory Visit (INDEPENDENT_AMBULATORY_CARE_PROVIDER_SITE_OTHER): Payer: Medicare Other

## 2019-01-14 ENCOUNTER — Ambulatory Visit (INDEPENDENT_AMBULATORY_CARE_PROVIDER_SITE_OTHER): Payer: Medicare Other | Admitting: Orthopaedic Surgery

## 2019-01-14 ENCOUNTER — Encounter (INDEPENDENT_AMBULATORY_CARE_PROVIDER_SITE_OTHER): Payer: Self-pay | Admitting: Orthopaedic Surgery

## 2019-01-14 VITALS — BP 152/110 | HR 75

## 2019-01-14 DIAGNOSIS — M1712 Unilateral primary osteoarthritis, left knee: Secondary | ICD-10-CM

## 2019-01-14 DIAGNOSIS — M25562 Pain in left knee: Secondary | ICD-10-CM

## 2019-01-14 NOTE — Progress Notes (Signed)
Office Visit Note   Patient: Christian [name]           Date of Birth: 08-Mar-1946           MRN: [id number] Visit Date: 01/14/2019              Requested by: Christian [name], L.Christian [name], Christian [name], Christian [name] [id number] PCP: Christian [name], L.Christian Sa, MD   Assessment & Plan: Visit Diagnoses:  1. Acute pain of left knee   2. Primary osteoarthritis of left knee     Plan: End-stage osteoarthritis left knee.  [name] would like to proceed with a total knee replacement.  He has had prior cortisone and Visco supplementation and a prior right total knee replacement.he is ready.  Will obtain clearance from Dr. Donnie Coffin  Follow-Up Instructions: Return will schedule left TKR.   Orders:  Orders Placed This Encounter  Procedures  . XR KNEE 3 VIEW LEFT   No orders of the defined types were placed in this encounter.     Procedures: No procedures performed   Clinical Data: No additional findings.   Subjective: Chief Complaint  Patient presents with  . Left Knee - Pain  . Knee Pain    pt stated Lt knee painful and requesting to have surgery.  Progressive symptoms left knee replacement consistent with end-stage osteoarthritis.  Has been having trouble over several years with cortisone and Visco supplementation.  [name] is reached the point where he is having significant compromise of his proceed with knee replacement.  He is had a prior successful right knee replacement  HPI  Review of Systems  Constitutional: Negative.   HENT: Negative.   Eyes: Negative.   Respiratory: Negative.   Cardiovascular: Negative.   Gastrointestinal: Negative.   Endocrine: Negative.   Genitourinary: Negative.   Musculoskeletal: Positive for gait problem.  Skin: Negative.   Allergic/Immunologic: Negative.   Hematological: Negative.   Psychiatric/Behavioral: Negative.      Objective: Vital Signs: BP (!) 152/110   Pulse 75   Physical Exam Constitutional:      Appearance:  He is well-developed.  Eyes:     Pupils: Pupils are equal, round, and reactive to light.  Pulmonary:     Effort: Pulmonary effort is normal.  Skin:    General: Skin is warm and dry.  Neurological:     Mental Status: He is alert and oriented to person, place, and time.  Psychiatric:        Behavior: Behavior normal.     Ortho Exam awake alert and oriented x3.  Comfortable sitting.  Limps in reference to his left knee.  No effusion.  Predominately patellofemoral and lateral joint pain.  Full extension 105 degrees of flexion.  No calf pain.  No distal edema.  Neurologically intact.  No pain with range of motion of left hip.  Straight leg raise negative  Specialty Comments:  No specialty comments available.  Imaging: Xr Knee 3 View Left  Result Date: 01/14/2019 Films of the left knee were obtained in 3 projections standing and compared to films were performed 2 years ago.  There is progressive degenerative change about the patellofemoral joint and the lateral joint space where there is narrowing and peripheral osteophytes.  There is some narrowing of the medial compartment with subchondral sclerosis and cyst change.  All of the changes are progressive since the films performed in 2018    PMFS History: Patient Active Problem List   Diagnosis Date Noted  .  PVC's (premature ventricular contractions)   . Dyspnea 12/06/2016  . Osteoarthritis of right hip 09/22/2015  . S/P total hip arthroplasty 09/22/2015  . Obesity 05/08/2014  . Osteoarthritis of knee 05/08/2014  . S/P total knee replacement using cement 05/06/2014  . Knee pain 04/08/2014  . Preoperative clearance 04/08/2014  . Coronary artery disease   . Diastolic dysfunction   . Hypertension   . Orthostatic hypotension   . Hypercholesterolemia    Past Medical History:  Diagnosis Date  . Arthritis    "joints; shoulders; left elbow; right thumb" (05/06/2014)  . Cataracts, bilateral    immature  . Constipation    takes Colace  and Metamucil daily  . Coronary artery disease 02/2011   cath 11/2016 showing 20% RCA, 50% OM, 35% mid LCx, 70% D1, 65% mid LAD, 70% distal LAD 80% on medical management  . Diastolic dysfunction   . Heart murmur   . Hemorrhoids   . History of bronchitis   . Hypercholesterolemia    takes Crestor daily  . Hypertension   . Joint pain   . Nocturia   . Orthostatic hypotension   . PVC's (premature ventricular contractions)    PVC load 0.2% by Holter 12/2016  . Sciatica     Family History  Problem Relation Age of Onset  . CVA Mother   . CVA Father   . Heart disease Father     Past Surgical History:  Procedure Laterality Date  . ACHILLES TENDON REPAIR Left    "& fixed a spur"  . CARDIAC CATHETERIZATION  2012  . CARDIAC CATHETERIZATION N/A 12/06/2016   Procedure: Left Heart Cath and Coronary Angiography;  Surgeon: Belva Crome, MD;  Location: [facility];  Service: Cardiovascular;  Laterality: N/A;  . COLONOSCOPY    . FLEXIBLE SIGMOIDOSCOPY  X 2  . HAND SURGERY Left    "thumb; cleaned out arthritis"  . HEEL SPUR EXCISION Left   . KNEE ARTHROSCOPY Right   . SHOULDER SURGERY Right X 2   "cleaned out spurs"  . TOE FUSION Right    great toe; plates and screws  . TONSILLECTOMY  ~ 1950  . TOTAL HIP ARTHROPLASTY Right 09/22/2015   Procedure: TOTAL HIP ARTHROPLASTY;  Surgeon: Garald Balding, MD;  Location: [facility];  Service: Orthopedics;  Laterality: Right;  . TOTAL KNEE ARTHROPLASTY Right 05/06/2014  . TOTAL KNEE ARTHROPLASTY Right 05/06/2014   Procedure: RIGHT TOTAL KNEE ARTHROPLASTY;  Surgeon: Garald Balding, MD;  Location: [facility];  Service: Orthopedics;  Laterality: Right;   Social History   Occupational History  . Not on file  Tobacco Use  . Smoking status: Former Smoker    Packs/day: 2.00    Years: 10.00    Pack years: 20.00    Types: Cigarettes  . Smokeless tobacco: Never Used  . Tobacco comment: quit smoking in 1981  Substance and Sexual Activity  . Alcohol  use: Yes    Comment: "drink a beer sometimes once/month"  . Drug use: No  . Sexual activity: Never

## 2019-01-23 MED ORDER — LISINOPRIL 40 MG PO TABS: 40.0000 mg | ORAL_TABLET | Freq: Every day | ORAL | 3 refills | Status: DC

## 2019-01-24 ENCOUNTER — Encounter (INDEPENDENT_AMBULATORY_CARE_PROVIDER_SITE_OTHER): Payer: Self-pay | Admitting: Orthopedic Surgery

## 2019-01-24 ENCOUNTER — Ambulatory Visit (INDEPENDENT_AMBULATORY_CARE_PROVIDER_SITE_OTHER): Payer: Medicare Other | Admitting: Orthopedic Surgery

## 2019-01-24 VITALS — BP 141/95 | HR 64 | Temp 98.3°F | Resp 18 | Ht 71.75 in | Wt 271.0 lb

## 2019-01-24 DIAGNOSIS — M1712 Unilateral primary osteoarthritis, left knee: Secondary | ICD-10-CM | POA: Diagnosis not present

## 2019-01-24 NOTE — Progress Notes (Signed)
Office Visit Note   Patient: Christian [name]           Date of Birth: [birth year]           MRN: [id number] Visit Date: 01/24/2019              Requested by: Alroy Dust, L.Marlou Sa, [place] [employer] [address] [place], [place] [address] PCP: Alroy Dust, L.Marlou Sa, MD  Chief Complaint:left knee pain.  HPI: WARIS RODGER, 73 y.o. male, has a history of pain and functional disability in the left knee due to arthritis and has failed non-surgical conservative treatments for greater than 12 weeks to includeNSAID's and/or analgesics, corticosteriod injections, viscosupplementation injections, flexibility and strengthening excercises, use of assistive devices, weight reduction as appropriate and activity modification.  Onset of symptoms was abrupt, starting 1 years ago with rapidlly worsening course since that time. The patient noted no past surgery on the left knee(s).  Patient currently rates pain in the left knee(s) at 8 out of 10 with activity. Patient has night pain, worsening of pain with activity and weight bearing, pain that interferes with activities of daily living, pain with passive range of motion, crepitus and joint swelling.  Patient has evidence of subchondral cysts, subchondral sclerosis, periarticular osteophytes and joint space narrowing by imaging studies.   Patient Active Problem List   Diagnosis Date Noted  . PVC's (premature ventricular contractions)   . Dyspnea 12/06/2016  . Osteoarthritis of right hip 09/22/2015  . S/P total hip arthroplasty 09/22/2015  . Obesity 05/08/2014  . Osteoarthritis of knee 05/08/2014  . S/P total knee replacement using cement 05/06/2014  . Knee pain 04/08/2014  . Preoperative clearance 04/08/2014  . Coronary artery disease   . Diastolic dysfunction   . Hypertension   . Orthostatic hypotension   . Hypercholesterolemia    Past Medical History:  Diagnosis Date  . Arthritis    "joints; shoulders; left elbow; right thumb" (05/06/2014)  . Cataracts,  bilateral    immature  . Constipation    takes Colace and Metamucil daily  . Coronary artery disease 02/2011   cath 11/2016 showing 20% RCA, 50% OM, 35% mid LCx, 70% D1, 65% mid LAD, 70% distal LAD 80% on medical management  . Diastolic dysfunction   . Heart murmur   . Hemorrhoids   . History of bronchitis   . Hypercholesterolemia    takes Crestor daily  . Hypertension   . Joint pain   . Nocturia   . Orthostatic hypotension   . PVC's (premature ventricular contractions)    PVC load 0.2% by Holter 12/2016  . Sciatica     Past Surgical History:  Procedure Laterality Date  . ACHILLES TENDON REPAIR Left    "& fixed a spur"  . CARDIAC CATHETERIZATION  2012  . CARDIAC CATHETERIZATION N/A 12/06/2016   Procedure: Left Heart Cath and Coronary Angiography;  Surgeon: Belva Crome, MD;  Location: [facility];  Service: Cardiovascular;  Laterality: N/A;  . COLONOSCOPY    . FLEXIBLE SIGMOIDOSCOPY  X 2  . HAND SURGERY Left    "thumb; cleaned out arthritis"  . HEEL SPUR EXCISION Left   . KNEE ARTHROSCOPY Right   . SHOULDER SURGERY Right X 2   "cleaned out spurs"  . TOE FUSION Right    great toe; plates and screws  . TONSILLECTOMY  ~ 1950  . TOTAL HIP ARTHROPLASTY Right 09/22/2015   Procedure: TOTAL HIP ARTHROPLASTY;  Surgeon: Garald Balding, MD;  Location:  [facility];  Service: Orthopedics;  Laterality: Right;  . TOTAL KNEE ARTHROPLASTY Right 05/06/2014  . TOTAL KNEE ARTHROPLASTY Right 05/06/2014   Procedure: RIGHT TOTAL KNEE ARTHROPLASTY;  Surgeon: Garald Balding, MD;  Location: [facility];  Service: Orthopedics;  Laterality: Right;    No current facility-administered medications for this encounter.    Current Outpatient Medications  Medication Sig Dispense Refill Last Dose  . amoxicillin (AMOXIL) 500 MG tablet Take 4 tablets 2 hours prior to any dental procedure (Patient taking differently: Take 2,000 mg by mouth See admin instructions. Take 2000 mg by mouth 2 hours prior to any  dental procedure) 21 tablet 0 Taking  . aspirin 81 MG tablet Take 81 mg by mouth daily.   Taking  . docusate sodium (COLACE) 100 MG capsule Take 300 mg by mouth daily.   Taking  . furosemide (LASIX) 20 MG tablet TAKE 1 TABLET BY MOUTH EVERY MONDAY, WEDNESDAY, FRIDAY (Patient taking differently: Take 20 mg by mouth every Monday, Wednesday, and Friday. TAKE 1 TABLET BY MOUTH EVERY MONDAY, WEDNESDAY, FRIDAY) 42 tablet 3 Taking  . isosorbide mononitrate (IMDUR) 60 MG 24 hr tablet TAKE 1 TABLET(60 MG) BY MOUTH DAILY (Patient taking differently: Take 60 mg by mouth daily. ) 90 tablet 2 Taking  . nitroGLYCERIN (NITROSTAT) 0.4 MG SL tablet PLACE 1 TABLET UNDER THE TONGUE EVERY 5 MINUTES AS NEEDED FOR CHEST PAIN (Patient taking differently: Place 0.4 mg under the tongue every 5 (five) minutes as needed for chest pain. ) 75 tablet 3 Taking  . rosuvastatin (CRESTOR) 20 MG tablet TAKE 1 TABLET(20 MG) BY MOUTH DAILY (Patient taking differently: Take 20 mg by mouth daily. ) 90 tablet 2 Taking  . lisinopril (PRINIVIL,ZESTRIL) 40 MG tablet Take 1 tablet (40 mg total) by mouth daily. 90 tablet 3 Taking   Allergies  Allergen Reactions  . Atorvastatin Calcium [Atorvastatin] Other (See Comments)    Myalgia  . Simvastatin Other (See Comments)    Myalgia  . Pravastatin Other (See Comments)    Leg Cramps    Social History   Tobacco Use  . Smoking status: Former Smoker    Packs/day: 2.00    Years: 10.00    Pack years: 20.00    Types: Cigarettes    Last attempt to quit: 1981    Years since quitting: 39.1  . Smokeless tobacco: Never Used  . Tobacco comment: quit smoking in 1981  Substance Use Topics  . Alcohol use: Yes    Comment: "drink a beer sometimes once/month"    Family History  Problem Relation Age of Onset  . CVA Mother   . CVA Father   . Heart disease Father      Review of Systems  Constitutional: Negative for fatigue.  HENT: Negative for trouble swallowing.   Eyes: Negative for pain.    Respiratory: Negative for chest tightness.   Cardiovascular: Negative for leg swelling.  Gastrointestinal: Negative for constipation.  Endocrine: Negative for cold intolerance.  Genitourinary: Negative for difficulty urinating.  Musculoskeletal: Negative for joint swelling.  Skin: Negative for rash.  Allergic/Immunologic: Negative for food allergies.  Neurological: Negative for weakness.  Hematological: Does not bruise/bleed easily.  Psychiatric/Behavioral: Negative for sleep disturbance.   Objective:  Physical Exam  Constitutional: He is oriented to person, place, and time. He appears well-developed and well-nourished.  HENT:  Head: Normocephalic and atraumatic.  Eyes: Pupils are equal, round, and reactive to light. Conjunctivae and EOM are normal.  Neck: Normal range of motion.  Cardiovascular: Normal rate, regular rhythm, normal heart sounds and intact distal pulses.  No murmur heard. Respiratory: Effort normal and breath sounds normal.  GI: Soft. Bowel sounds are normal. There is no abdominal tenderness.  Neurological: He is alert and oriented to person, place, and time.  Skin: Skin is warm and dry.  Psychiatric: He has a normal mood and affect. His behavior is normal. Judgment and thought content normal.  Musculoskeletal:  ROM 3-5 to 100 degrees. Mild effusion. Crepitus with ROM  Vital signs in last 24 hours: Temp:  [98.3 F (36.8 C)] 98.3 F (36.8 C) (01/30 1246) Pulse Rate:  [64] 64 (01/30 1246) Resp:  [18] 18 (01/30 1246) BP: (141)/(95) 141/95 (01/30 1246) Weight:  [122.9 kg] 122.9 kg (01/30 1246)  Labs:   Estimated body mass index is 37.01 kg/m as calculated from the following:   Height as of 01/24/19: 5' 11.75" (1.822 m).   Weight as of 01/24/19: 122.9 kg.   Imaging Review Plain radiographs demonstrate moderate degenerative joint disease of the left knee(s). The overall alignment is mild valgus. The bone quality appears to be good for age and reported  activity level.   Preoperative templating of the joint replacement has been completed, documented, and submitted to the Operating Room personnel in order to optimize intra-operative equipment management.   Anticipated LOS equal to or greater than 2 midnights due to - Age 54 and older with one or more of the following:  - Obesity  - Expected need for hospital services (PT, OT, Nursing) required for safe  discharge  - Anticipated need for postoperative skilled nursing care or inpatient rehab  - Active co-morbidities: Coronary Artery Disease OR   -   - Patient is a high risk of re-admission due to: None     Assessment/Plan:  End stage arthritis, left knee   The patient history, physical examination, clinical judgment of the provider and imaging studies are consistent with end stage degenerative joint disease of the left knee(s) and total knee arthroplasty is deemed medically necessary. The treatment options including medical management, injection therapy arthroscopy and arthroplasty were discussed at length. The risks and benefits of total knee arthroplasty were presented and reviewed. The risks due to aseptic loosening, infection, stiffness, patella tracking problems, thromboembolic complications and other imponderables were discussed. The patient acknowledged the explanation, agreed to proceed with the plan and consent was signed. Patient is being admitted for inpatient treatment for surgery, pain control, PT, OT, prophylactic antibiotics, VTE prophylaxis, progressive ambulation and ADL's and discharge planning. The patient is planning to be discharged home with home health services  Mike Craze. [place], [place] [phone]  01/24/2019 1:49 PM

## 2019-01-24 NOTE — H&P (Signed)
TOTAL KNEE ADMISSION H&P  Patient is being admitted for left total knee arthroplasty.  Subjective:  Chief Complaint:left knee pain.  HPI: Christian [name], 73 y.o. male, has a history of pain and functional disability in the left knee due to arthritis and has failed non-surgical conservative treatments for greater than 12 weeks to includeNSAID's and/or analgesics, corticosteriod injections, viscosupplementation injections, flexibility and strengthening excercises, use of assistive devices, weight reduction as appropriate and activity modification.  Onset of symptoms was abrupt, starting 1 years ago with rapidlly worsening course since that time. The patient noted no past surgery on the left knee(s).  Patient currently rates pain in the left knee(s) at 8 out of 10 with activity. Patient has night pain, worsening of pain with activity and weight bearing, pain that interferes with activities of daily living, pain with passive range of motion, crepitus and joint swelling.  Patient has evidence of subchondral cysts, subchondral sclerosis, periarticular osteophytes and joint space narrowing by imaging studies.   Patient Active Problem List   Diagnosis Date Noted  . PVC's (premature ventricular contractions)   . Dyspnea 12/06/2016  . Osteoarthritis of right hip 09/22/2015  . S/P total hip arthroplasty 09/22/2015  . Obesity 05/08/2014  . Osteoarthritis of knee 05/08/2014  . S/P total knee replacement using cement 05/06/2014  . Knee pain 04/08/2014  . Preoperative clearance 04/08/2014  . Coronary artery disease   . Diastolic dysfunction   . Hypertension   . Orthostatic hypotension   . Hypercholesterolemia    Past Medical History:  Diagnosis Date  . Arthritis    "joints; shoulders; left elbow; right thumb" (05/06/2014)  . Cataracts, bilateral    immature  . Constipation    takes Colace and Metamucil daily  . Coronary artery disease 02/2011   cath 11/2016 showing 20% RCA, 50% OM, 35% mid LCx,  70% D1, 65% mid LAD, 70% distal LAD 80% on medical management  . Diastolic dysfunction   . Heart murmur   . Hemorrhoids   . History of bronchitis   . Hypercholesterolemia    takes Crestor daily  . Hypertension   . Joint pain   . Nocturia   . Orthostatic hypotension   . PVC's (premature ventricular contractions)    PVC load 0.2% by Holter 12/2016  . Sciatica     Past Surgical History:  Procedure Laterality Date  . ACHILLES TENDON REPAIR Left    "& fixed a spur"  . CARDIAC CATHETERIZATION  2012  . CARDIAC CATHETERIZATION N/A 12/06/2016   Procedure: Left Heart Cath and Coronary Angiography;  Surgeon: Belva Crome, MD;  Location: [facility];  Service: Cardiovascular;  Laterality: N/A;  . COLONOSCOPY    . FLEXIBLE SIGMOIDOSCOPY  X 2  . HAND SURGERY Left    "thumb; cleaned out arthritis"  . HEEL SPUR EXCISION Left   . KNEE ARTHROSCOPY Right   . SHOULDER SURGERY Right X 2   "cleaned out spurs"  . TOE FUSION Right    great toe; plates and screws  . TONSILLECTOMY  ~ 1950  . TOTAL HIP ARTHROPLASTY Right 09/22/2015   Procedure: TOTAL HIP ARTHROPLASTY;  Surgeon: Garald Balding, MD;  Location: [place];  Service: Orthopedics;  Laterality: Right;  . TOTAL KNEE ARTHROPLASTY Right 05/06/2014  . TOTAL KNEE ARTHROPLASTY Right 05/06/2014   Procedure: RIGHT TOTAL KNEE ARTHROPLASTY;  Surgeon: Garald Balding, MD;  Location: [place];  Service: Orthopedics;  Laterality: Right;    No current facility-administered medications for this  encounter.    Current Outpatient Medications  Medication Sig Dispense Refill Last Dose  . amoxicillin (AMOXIL) 500 MG tablet Take 4 tablets 2 hours prior to any dental procedure (Patient taking differently: Take 2,000 mg by mouth See admin instructions. Take 2000 mg by mouth 2 hours prior to any dental procedure) 21 tablet 0 Taking  . aspirin 81 MG tablet Take 81 mg by mouth daily.   Taking  . docusate sodium (COLACE) 100 MG capsule Take 300 mg by mouth daily.    Taking  . furosemide (LASIX) 20 MG tablet TAKE 1 TABLET BY MOUTH EVERY MONDAY, WEDNESDAY, FRIDAY (Patient taking differently: Take 20 mg by mouth every Monday, Wednesday, and Friday. TAKE 1 TABLET BY MOUTH EVERY MONDAY, WEDNESDAY, FRIDAY) 42 tablet 3 Taking  . isosorbide mononitrate (IMDUR) 60 MG 24 hr tablet TAKE 1 TABLET(60 MG) BY MOUTH DAILY (Patient taking differently: Take 60 mg by mouth daily. ) 90 tablet 2 Taking  . nitroGLYCERIN (NITROSTAT) 0.4 MG SL tablet PLACE 1 TABLET UNDER THE TONGUE EVERY 5 MINUTES AS NEEDED FOR CHEST PAIN (Patient taking differently: Place 0.4 mg under the tongue every 5 (five) minutes as needed for chest pain. ) 75 tablet 3 Taking  . rosuvastatin (CRESTOR) 20 MG tablet TAKE 1 TABLET(20 MG) BY MOUTH DAILY (Patient taking differently: Take 20 mg by mouth daily. ) 90 tablet 2 Taking  . lisinopril (PRINIVIL,ZESTRIL) 40 MG tablet Take 1 tablet (40 mg total) by mouth daily. 90 tablet 3 Taking   Allergies  Allergen Reactions  . Atorvastatin Calcium [Atorvastatin] Other (See Comments)    Myalgia  . Simvastatin Other (See Comments)    Myalgia  . Pravastatin Other (See Comments)    Leg Cramps    Social History   Tobacco Use  . Smoking status: Former Smoker    Packs/day: 2.00    Years: 10.00    Pack years: 20.00    Types: Cigarettes    Last attempt to quit: 1981    Years since quitting: 39.1  . Smokeless tobacco: Never Used  . Tobacco comment: quit smoking in 1981  Substance Use Topics  . Alcohol use: Yes    Comment: "drink a beer sometimes once/month"    Family History  Problem Relation Age of Onset  . CVA Mother   . CVA Father   . Heart disease Father      Review of Systems  Constitutional: Negative for fatigue.  HENT: Negative for trouble swallowing.   Eyes: Negative for pain.  Respiratory: Negative for chest tightness.   Cardiovascular: Negative for leg swelling.  Gastrointestinal: Negative for constipation.  Endocrine: Negative for cold  intolerance.  Genitourinary: Negative for difficulty urinating.  Musculoskeletal: Negative for joint swelling.  Skin: Negative for rash.  Allergic/Immunologic: Negative for food allergies.  Neurological: Negative for weakness.  Hematological: Does not bruise/bleed easily.  Psychiatric/Behavioral: Negative for sleep disturbance.   Objective:  Physical Exam  Constitutional: He is oriented to person, place, and time. He appears well-developed and well-nourished.  HENT:  Head: Normocephalic and atraumatic.  Eyes: Pupils are equal, round, and reactive to light. Conjunctivae and EOM are normal.  Neck: Normal range of motion.  Cardiovascular: Normal rate, regular rhythm, normal heart sounds and intact distal pulses.  No murmur heard. Respiratory: Effort normal and breath sounds normal.  GI: Soft. Bowel sounds are normal. There is no abdominal tenderness.  Neurological: He is alert and oriented to person, place, and time.  Skin: Skin is warm and dry.  Psychiatric: He has a normal mood and affect. His behavior is normal. Judgment and thought content normal.  Musculoskeletal:  ROM 3-5 to 100 degrees. Mild effusion. Crepitus with ROM  Vital signs in last 24 hours: Temp:  [98.3 F (36.8 C)] 98.3 F (36.8 C) (01/30 1246) Pulse Rate:  [64] 64 (01/30 1246) Resp:  [18] 18 (01/30 1246) BP: (141)/(95) 141/95 (01/30 1246) Weight:  [122.9 kg] 122.9 kg (01/30 1246)  Labs:   Estimated body mass index is 37.01 kg/m as calculated from the following:   Height as of 01/24/19: 5' 11.75" (1.822 m).   Weight as of 01/24/19: 122.9 kg.   Imaging Review Plain radiographs demonstrate moderate degenerative joint disease of the left knee(s). The overall alignment is mild valgus. The bone quality appears to be good for age and reported activity level.   Preoperative templating of the joint replacement has been completed, documented, and submitted to the Operating Room personnel in order to optimize  intra-operative equipment management.   Anticipated LOS equal to or greater than 2 midnights due to - Age 68 and older with one or more of the following:  - Obesity  - Expected need for hospital services (PT, OT, Nursing) required for safe  discharge  - Anticipated need for postoperative skilled nursing care or inpatient rehab  - Active co-morbidities: Coronary Artery Disease OR   -   - Patient is a high risk of re-admission due to: None     Assessment/Plan:  End stage arthritis, left knee   The patient history, physical examination, clinical judgment of the provider and imaging studies are consistent with end stage degenerative joint disease of the left knee(s) and total knee arthroplasty is deemed medically necessary. The treatment options including medical management, injection therapy arthroscopy and arthroplasty were discussed at length. The risks and benefits of total knee arthroplasty were presented and reviewed. The risks due to aseptic loosening, infection, stiffness, patella tracking problems, thromboembolic complications and other imponderables were discussed. The patient acknowledged the explanation, agreed to proceed with the plan and consent was signed. Patient is being admitted for inpatient treatment for surgery, pain control, PT, OT, prophylactic antibiotics, VTE prophylaxis, progressive ambulation and ADL's and discharge planning. The patient is planning to be discharged home with home health services  Mike Craze. [place], [place] [phone]  01/24/2019 1:49 PM

## 2019-01-28 NOTE — Pre-Procedure Instructions (Signed)
Christian [name]  01/28/2019      [id number] - [place], [place] - [address] [place]  [address] Phone: [phone] Fax: [phone]  [id number] [address], [address] AT [facility] OF [address] [place] [address] Phone: [phone] Fax: [phone]    Your procedure is scheduled on Tuesday February 11th.  Report to [facility] Admitting at 8:00 A.M.  Call this number if you have problems the morning of surgery:  [phone]   Remember:  Do not eat or drink after midnight.      Take these medicines the morning of surgery with A SIP OF WATER  isosorbide mononitrate (IMDUR) rosuvastatin (CRESTOR)  nitroGLYCERIN (NITROSTAT)-if needed  Follow your surgeon's instructions on when to stop Asprin.  If no instructions were given by your surgeon then you will need to call the office to get those instructions.    7 days prior to surgery STOP taking any Aspirin(unless otherwise instructed by your surgeon), Aleve, Naproxen, Ibuprofen, Motrin, Advil, Goody's, BC's, all herbal medications, fish oil, and all vitamins     Do not wear jewelry  Do not wear lotions, powders, or perfumes, or deodorant.  Do not shave 48 hours prior to surgery.  Men may shave face and neck.  Do not bring valuables to the hospital.  [facility] is not responsible for any belongings or valuables.  Contacts, eyeglasses, hearing aids, dentures or bridgework may not be worn into surgery.  Leave your suitcase in the car.  After surgery it may be brought to your room.  For patients admitted to the hospital, discharge time will be determined by your treatment team.  Patients discharged the day of surgery will not be allowed to drive home.   [place]- Preparing For Surgery  Before surgery, you can play an important role. Because skin is not sterile, your skin needs to be as free of germs as  possible. You can reduce the number of germs on your skin by washing with CHG (chlorahexidine gluconate) Soap before surgery.  CHG is an antiseptic cleaner which kills germs and bonds with the skin to continue killing germs even after washing.    Oral Hygiene is also important to reduce your risk of infection.  Remember - BRUSH YOUR TEETH THE MORNING OF SURGERY WITH YOUR REGULAR TOOTHPASTE  Please do not use if you have an allergy to CHG or antibacterial soaps. If your skin becomes reddened/irritated stop using the CHG.  Do not shave (including legs and underarms) for at least 48 hours prior to first CHG shower. It is OK to shave your face.  Please follow these instructions carefully.   1. Shower the NIGHT BEFORE SURGERY and the MORNING OF SURGERY with CHG.   2. If you chose to wash your hair, wash your hair first as usual with your normal shampoo.  3. After you shampoo, rinse your hair and body thoroughly to remove the shampoo.  4. Use CHG as you would any other liquid soap. You can apply CHG directly to the skin and wash gently with a scrungie or a clean washcloth.   5. Apply the CHG Soap to your body ONLY FROM THE NECK DOWN.  Do not use on open wounds or open sores. Avoid contact with your eyes, ears, mouth and genitals (private parts). Wash Face and genitals (private parts)  with your normal  soap.  6. Wash thoroughly, paying special attention to the area where your surgery will be performed.  7. Thoroughly rinse your body with warm water from the neck down.  8. DO NOT shower/wash with your normal soap after using and rinsing off the CHG Soap.  9. Pat yourself dry with a CLEAN TOWEL.  10. Wear CLEAN PAJAMAS to bed the night before surgery, wear comfortable clothes the morning of surgery  11. Place CLEAN SHEETS on your bed the night of your first shower and DO NOT SLEEP WITH PETS.    Day of Surgery: Shower as stated above. Do not apply any deodorants/lotions.  Please wear clean  clothes to the hospital/surgery center.   Remember to brush your teeth WITH YOUR REGULAR TOOTHPASTE.

## 2019-01-29 ENCOUNTER — Encounter (HOSPITAL_COMMUNITY): Payer: Self-pay

## 2019-01-29 ENCOUNTER — Telehealth (INDEPENDENT_AMBULATORY_CARE_PROVIDER_SITE_OTHER): Payer: Self-pay | Admitting: Orthopaedic Surgery

## 2019-01-29 ENCOUNTER — Other Ambulatory Visit: Payer: Self-pay

## 2019-01-29 ENCOUNTER — Encounter (HOSPITAL_COMMUNITY)
Admission: RE | Admit: 2019-01-29 | Discharge: 2019-01-29 | Disposition: A | Discharge status: Home / Self Care | Payer: Medicare Other | Source: Ambulatory Visit | Attending: Orthopaedic Surgery | Admitting: Orthopaedic Surgery

## 2019-01-29 DIAGNOSIS — Z01818 Encounter for other preprocedural examination: Secondary | ICD-10-CM | POA: Diagnosis not present

## 2019-01-29 DIAGNOSIS — Z7982 Long term (current) use of aspirin: Secondary | ICD-10-CM | POA: Diagnosis not present

## 2019-01-29 DIAGNOSIS — K5900 Constipation, unspecified: Secondary | ICD-10-CM | POA: Insufficient documentation

## 2019-01-29 DIAGNOSIS — Z96651 Presence of right artificial knee joint: Secondary | ICD-10-CM | POA: Diagnosis not present

## 2019-01-29 DIAGNOSIS — E7800 Pure hypercholesterolemia, unspecified: Secondary | ICD-10-CM | POA: Insufficient documentation

## 2019-01-29 DIAGNOSIS — Z96641 Presence of right artificial hip joint: Secondary | ICD-10-CM | POA: Insufficient documentation

## 2019-01-29 DIAGNOSIS — I2510 Atherosclerotic heart disease of native coronary artery without angina pectoris: Secondary | ICD-10-CM | POA: Diagnosis not present

## 2019-01-29 DIAGNOSIS — Z79899 Other long term (current) drug therapy: Secondary | ICD-10-CM | POA: Insufficient documentation

## 2019-01-29 DIAGNOSIS — Z87891 Personal history of nicotine dependence: Secondary | ICD-10-CM | POA: Insufficient documentation

## 2019-01-29 DIAGNOSIS — I119 Hypertensive heart disease without heart failure: Secondary | ICD-10-CM | POA: Insufficient documentation

## 2019-01-29 DIAGNOSIS — M1712 Unilateral primary osteoarthritis, left knee: Secondary | ICD-10-CM | POA: Insufficient documentation

## 2019-01-29 LAB — CBC WITH DIFFERENTIAL/PLATELET
Abs Immature Granulocytes: 0.03 10*3/uL (ref 0.00–0.07)
BASOS PCT: 1 %
Basophils Absolute: 0.1 10*3/uL (ref 0.0–0.1)
Eosinophils Absolute: 0.4 10*3/uL (ref 0.0–0.5)
Eosinophils Relative: 5 %
HCT: 41.6 % (ref 39.0–52.0)
Hemoglobin: 13.9 g/dL (ref 13.0–17.0)
Immature Granulocytes: 0 %
Lymphocytes Relative: 20 %
Lymphs Abs: 1.5 10*3/uL (ref 0.7–4.0)
MCH: 30.9 pg (ref 26.0–34.0)
MCHC: 33.4 g/dL (ref 30.0–36.0)
MCV: 92.4 fL (ref 80.0–100.0)
Monocytes Absolute: 0.7 10*3/uL (ref 0.1–1.0)
Monocytes Relative: 9 %
Neutro Abs: 4.7 10*3/uL (ref 1.7–7.7)
Neutrophils Relative %: 65 %
PLATELETS: 216 10*3/uL (ref 150–400)
RBC: 4.5 MIL/uL (ref 4.22–5.81)
RDW: 13 % (ref 11.5–15.5)
WBC: 7.4 10*3/uL (ref 4.0–10.5)
nRBC: 0 % (ref 0.0–0.2)

## 2019-01-29 LAB — TYPE AND SCREEN
ABO/RH(D): O POS
Antibody Screen: NEGATIVE

## 2019-01-29 LAB — COMPREHENSIVE METABOLIC PANEL
ALT: 8 U/L (ref 0–44)
AST: 15 U/L (ref 15–41)
Albumin: 3.5 g/dL (ref 3.5–5.0)
Alkaline Phosphatase: 50 U/L (ref 38–126)
Anion gap: 10 (ref 5–15)
BUN: 15 mg/dL (ref 8–23)
CHLORIDE: 106 mmol/L (ref 98–111)
CO2: 24 mmol/L (ref 22–32)
Calcium: 8.8 mg/dL — ABNORMAL LOW (ref 8.9–10.3)
Creatinine, Ser: 1.21 mg/dL (ref 0.61–1.24)
GFR calc Af Amer: >60 mL/min (ref >60)
GFR calc non Af Amer: 60 mL/min — ABNORMAL LOW (ref >60)
Glucose, Bld: 91 mg/dL (ref 70–99)
POTASSIUM: 4.3 mmol/L (ref 3.5–5.1)
Sodium: 140 mmol/L (ref 135–145)
Total Bilirubin: 1 mg/dL (ref 0.3–1.2)
Total Protein: 6.3 g/dL — ABNORMAL LOW (ref 6.5–8.1)

## 2019-01-29 LAB — PROTIME-INR
INR: 1.03
Prothrombin Time: 13.5 seconds (ref 11.4–15.2)

## 2019-01-29 LAB — URINALYSIS, ROUTINE W REFLEX MICROSCOPIC
Bilirubin Urine: NEGATIVE
Glucose, UA: NEGATIVE mg/dL
Hgb urine dipstick: NEGATIVE
Ketones, ur: NEGATIVE mg/dL
Leukocytes, UA: NEGATIVE
Nitrite: NEGATIVE
PROTEIN: NEGATIVE mg/dL
Specific Gravity, Urine: 1.021 (ref 1.005–1.030)
pH: 6 (ref 5.0–8.0)

## 2019-01-29 LAB — APTT: aPTT: 30 seconds (ref 24–36)

## 2019-01-29 LAB — SURGICAL PCR SCREEN
MRSA, PCR: NEGATIVE
Staphylococcus aureus: NEGATIVE

## 2019-01-29 NOTE — Telephone Encounter (Signed)
TODAY

## 2019-01-29 NOTE — Progress Notes (Signed)
PCP - Dr. Donavan Burnet Cardiologist - Dr. Golden Hurter  Chest x-ray - 01/29/19 EKG - 01/29/19 Stress Test - 11/30/16 ECHO - 10/16/18 Cardiac Cath - 12/06/16  Sleep Study - denies, + Stop bang, results sent to PCP   Aspirin Instructions: patient instructed to contact Dr. Wonda Horner office regarding his ASA. Patient instructed to hold NSAID's, herbal medications, fish oil and vitamins 7 days prior to surgery.   Anesthesia review: cardiac history  Patient denies shortness of breath, fever, cough and chest pain at PAT appointment   Patient verbalized understanding of instructions that were given to them at the PAT appointment. Patient was also instructed that they will need to review over the PAT instructions again at home before surgery.

## 2019-01-29 NOTE — Telephone Encounter (Signed)
Please advise 

## 2019-01-29 NOTE — Telephone Encounter (Signed)
Patient had his pre op appt at the hospital today. He is scheduled for surgery on Tuesday 02/05/19. Patient request a call back to let him know when he should stop his 81 mg ASA?

## 2019-01-30 ENCOUNTER — Telehealth: Payer: Self-pay | Admitting: *Deleted

## 2019-01-30 LAB — URINE CULTURE: Culture: NO GROWTH

## 2019-01-30 NOTE — Progress Notes (Addendum)
Anesthesia Chart Review:  Case:  [id number] Date/Time:  02/05/19 0955   Procedure:  LEFT TOTAL KNEE ARTHROPLASTY (Left )   Anesthesia type:  Choice   Pre-op diagnosis:  LEFT KNEE OSTEOARTHRITIS   Location:  [place] OR ROOM 06 / [place] OR   Surgeon:  Garald Balding, MD      DISCUSSION: Patient is a 73 year old male scheduled for the above procedure.  History includes former smoker (quit '81), CAD (20% RCA, 50% OM, 35% LCX, 70% D1, 65% mLAD, 70% dLAD 11/2016, medical therapy), diastolic dysfunction, murmur (mild AR 09/2018), PVCs (0.2% burden 12/2016 event monitor), HTN/orthostatic hypotension. BMI is consistent with obesity.   Patient had a low risk stress test for evaluation of chest pain in 09/2018 and no significant valvular disease or wall motion abnormality on echo. He denied chest pain and SOB per PAT RN interview.   Dr. Durward Fortes has requested cardiology clearance from Dr. Audie Box left with Christian [name] regarding status update.   ADDENDUM 01/31/19 3:21 PM: Cardiology clearance as outlined by Christian Kicks, NP on 01/30/19: "...Christian [name] was last seen on 10/03/18 by [name] and stress test and echo done were normal.  Since that day, Christian [name] has done well and able to meet 4 METS without any chest pain.  OK to hold Asprin for up to 5 days for surgery.  Therefore, based on ACC/AHA guidelines, the patient would be at acceptable risk for the planned procedure without further cardiovascular testing."   If no acute changes then I anticipate that he can proceed as planned.   VS: BP 123/81   Pulse 65   Temp 36.6 C (Oral)   Resp 18   Ht 5' 11.75" (1.822 m)   Wt 123 kg   SpO2 99%   BMI 37.03 kg/m    PROVIDERS: Christian [name], L.Marlou Sa, MD is PCP  Fransico Him, MD is cardiologist. Last visit 10/03/18 following ED visit for chest pain--possible stress related or progression of CAD. She ordered a Myoview which was low risk. Lisinopril decreased for orthostatic hypotension which had  occurred after an earlier medication adjustment--although increased again due to hypertension.   LABS: Labs reviewed: Acceptable for surgery. (all labs ordered are listed, but only abnormal results are displayed)  Labs Reviewed  COMPREHENSIVE METABOLIC PANEL - Abnormal; Notable for the following components:      Result Value   Calcium 8.8 (*)    Total Protein 6.3 (*)    GFR calc non Af Amer 60 (*)    All other components within normal limits  URINE CULTURE  SURGICAL PCR SCREEN  APTT  CBC WITH DIFFERENTIAL/PLATELET  PROTIME-INR  URINALYSIS, ROUTINE W REFLEX MICROSCOPIC  TYPE AND SCREEN    IMAGES: CXR 01/29/19: IMPRESSION: No active cardiopulmonary disease.   EKG: 01/29/19:  Normal sinus rhythm with sinus arrhythmia Inferior infarct , age undetermined Abnormal ECG No significant change since last tracing Confirmed by [facility], [place] [phone]) on 01/29/2019 8:30:06 PM   CV: Nuclear stress test 10/16/18:  Nuclear stress EF: 56%.  There was no ST segment deviation noted during stress.  The left ventricular ejection fraction is normal (55-65%).  There is a small defect of mild severity present in the basal inferoseptal location. The defect is non-reversible which is consistent with diaphragmatic attenuation artifact with no ischemia.  This is a low risk study.   Echo 10/16/18: Study Conclusions - Left ventricle: The cavity size was normal. Wall thickness was   normal. Systolic function was normal. The  estimated ejection   fraction was in the range of 60% to 65%. Wall motion was normal;   there were no regional wall motion abnormalities. Doppler   parameters are consistent with abnormal left ventricular   relaxation (grade 1 diastolic dysfunction). - Aortic valve: Mildly to moderately calcified annulus. Mildly   thickened leaflets. There was mild regurgitation. - Left atrium: The atrium was mildly dilated. - Right atrium: The atrium was mildly dilated.  48 hour Holter  monitor 12/27/16:  Normal sinus rhythm with heart rate ranging from 50 to 112bpm. The average heart rate was 69bpm.  Frequent PACs and nonsustained atrial tachycardia up to 6-9 beats.  Frequent PVCs, bigeminal PVCs and trigeminal PVCs  Cardiac cath 12/06/16:  The left ventricular ejection fraction is 45-50% by visual estimate.  The left ventricular systolic function is normal.  Prox RCA to Dist RCA lesion, 20 %stenosed.  Ost 1st Mrg to 1st Mrg lesion, 50 %stenosed.  Mid Cx to Dist Cx lesion, 35 %stenosed.  Ost 1st Diag to 1st Diag lesion, 70 %stenosed.  Mid LAD to Dist LAD lesion, 65 %stenosed.  Dist LAD lesion, 70 %stenosed.  There are no severe coronary obstructions. The mid and distal LAD contained eccentric 60-70% stenoses. The first agonal contains segmental proximal 70% narrowing. Irregularities are noted in the mid circumflex and proximal to mid RCA.  Low normal LV function with EF of proximally 45-50% with mildly elevated LVEDP of 20 mmHg.  Frequent PVCs, uniform during monitoring/procedure. Recommendations:  Consider 24-48 hour Holter to assess the burden of PVCs which will help determine if this could be contributing to the patient's dyspnea.  The mildly elevated EDP suggest the possibility of diastolic heart failure/dysfunction.   Past Medical History:  Diagnosis Date  . Arthritis    "joints; shoulders; left elbow; right thumb" (05/06/2014)  . Cataracts, bilateral    immature  . Constipation    takes Colace and Metamucil daily  . Coronary artery disease 02/2011   cath 11/2016 showing 20% RCA, 50% OM, 35% mid LCx, 70% D1, 65% mid LAD, 70% distal LAD 80% on medical management  . Diastolic dysfunction   . Heart murmur   . Hemorrhoids   . History of bronchitis   . Hypercholesterolemia    takes Crestor daily  . Hypertension   . Joint pain   . Nocturia   . Orthostatic hypotension   . PVC's (premature ventricular contractions)    PVC load 0.2% by Holter  12/2016  . Sciatica     Past Surgical History:  Procedure Laterality Date  . ACHILLES TENDON REPAIR Left    "& fixed a spur"  . CARDIAC CATHETERIZATION  2012  . CARDIAC CATHETERIZATION N/A 12/06/2016   Procedure: Left Heart Cath and Coronary Angiography;  Surgeon: Belva Crome, MD;  Location: [place];  Service: Cardiovascular;  Laterality: N/A;  . COLONOSCOPY    . FLEXIBLE SIGMOIDOSCOPY  X 2  . HAND SURGERY Left    "thumb; cleaned out arthritis"  . HEEL SPUR EXCISION Left   . KNEE ARTHROSCOPY Right   . SHOULDER SURGERY Right X 2   "cleaned out spurs"  . TOE FUSION Right    great toe; plates and screws  . TONSILLECTOMY  ~ 1950  . TOTAL HIP ARTHROPLASTY Right 09/22/2015   Procedure: TOTAL HIP ARTHROPLASTY;  Surgeon: Garald Balding, MD;  Location: [place];  Service: Orthopedics;  Laterality: Right;  . TOTAL KNEE ARTHROPLASTY Right 05/06/2014  . TOTAL KNEE ARTHROPLASTY  Right 05/06/2014   Procedure: RIGHT TOTAL KNEE ARTHROPLASTY;  Surgeon: Garald Balding, MD;  Location: [place];  Service: Orthopedics;  Laterality: Right;    MEDICATIONS: . amoxicillin (AMOXIL) 500 MG tablet  . aspirin 81 MG tablet  . docusate sodium (COLACE) 100 MG capsule  . furosemide (LASIX) 20 MG tablet  . isosorbide mononitrate (IMDUR) 60 MG 24 hr tablet  . lisinopril (PRINIVIL,ZESTRIL) 40 MG tablet  . nitroGLYCERIN (NITROSTAT) 0.4 MG SL tablet  . rosuvastatin (CRESTOR) 20 MG tablet   No current facility-administered medications for this encounter.   Last dose ASA 01/30/19 per Ortho.   Myra Gianotti, PA-C Surgical Short Stay/Anesthesiology [facility] Phone [phone] [facility] Phone [phone] 01/30/2019 1:03 PM

## 2019-01-30 NOTE — Telephone Encounter (Signed)
I called patient 

## 2019-01-30 NOTE — Telephone Encounter (Signed)
   [facility] Pre-operative Risk Assessment    Request for surgical clearance:  1. What type of surgery is being performed? LEFT TOTAL KNEE REPLACEMENT   2. When is this surgery scheduled? 02/05/19  3. What type of clearance is required (medical clearance vs. Pharmacy clearance to hold med vs. Both)? MEDICAL  4. Are there any medications that need to be held prior to surgery and how long?NONE LISTED; LEFT MESSAGE IF ASA TO BE HELD  5. Practice name and name of physician performing surgery? [facility]; DR. Durward Fortes   6. What is your office phone number [phone]    7.   What is your office fax number [phone]  8.   Anesthesia type (None, local, MAC, general) ? LEFT MESSAGE TO VERIFY TYPE OF ANESTHESIA   Julaine Hua 01/30/2019, 3:52 PM  _________________________________________________________________   (provider comments below)

## 2019-01-30 NOTE — Anesthesia Preprocedure Evaluation (Deleted)
Anesthesia Evaluation    Airway        Dental   Pulmonary former smoker,           Cardiovascular hypertension,      Neuro/Psych    GI/Hepatic   Endo/Other    Renal/GU      Musculoskeletal   Abdominal   Peds  Hematology   Anesthesia Other Findings   Reproductive/Obstetrics                                                               Anesthesia Evaluation  Patient identified by MRN, date of birth, ID band Patient awake    Reviewed: Allergy & Precautions, NPO status , Patient's Chart, lab work & pertinent test results  Airway Mallampati: II  TM Distance: >3 FB Neck ROM: Full    Dental  (+) Dental Advisory Given   Pulmonary former smoker,    breath sounds clear to auscultation- rhonchi       Cardiovascular hypertension, Pt. on medications + CAD   Rhythm:Regular Rate:Normal     Neuro/Psych negative neurological ROS     GI/Hepatic negative GI ROS, Neg liver ROS,   Endo/Other  Morbid obesity  Renal/GU negative Renal ROS     Musculoskeletal  (+) Arthritis ,   Abdominal   Peds  Hematology negative hematology ROS (+)   Anesthesia Other Findings   Reproductive/Obstetrics                            Lab Results  Component Value Date   WBC 7.4 01/29/2019   HGB 13.9 01/29/2019   HCT 41.6 01/29/2019   MCV 92.4 01/29/2019   PLT 216 01/29/2019   Lab Results  Component Value Date   CREATININE 1.21 01/29/2019   BUN 15 01/29/2019   NA 140 01/29/2019   K 4.3 01/29/2019   CL 106 01/29/2019   CO2 24 01/29/2019    Anesthesia Physical Anesthesia Plan  ASA: III  Anesthesia Plan: General   Post-op Pain Management:    Induction: Intravenous  Airway Management Planned: Oral ETT  Additional Equipment:   Intra-op Plan:   Post-operative Plan: Extubation in OR  Informed Consent: I have reviewed the patients History and  Physical, chart, labs and discussed the procedure including the risks, benefits and alternatives for the proposed anesthesia with the patient or authorized representative who has indicated his/her understanding and acceptance.   Dental advisory given  Plan Discussed with: CRNA  Anesthesia Plan Comments:        Anesthesia Quick Evaluation  Anesthesia Physical Anesthesia Plan  ASA:   Anesthesia Plan:    Post-op Pain Management:    Induction:   PONV Risk Score and Plan:   Airway Management Planned:   Additional Equipment:   Intra-op Plan:   Post-operative Plan:   Informed Consent:   Plan Discussed with:   Anesthesia Plan Comments: (PAT note written 01/30/2019 by Myra Gianotti, PA-C. )        Anesthesia Quick Evaluation

## 2019-01-31 NOTE — Telephone Encounter (Signed)
   Primary Cardiologist: Fransico Him, MD  Chart reviewed as part of pre-operative protocol coverage. Patient was contacted 01/31/2019 in reference to pre-operative risk assessment for pending surgery as outlined below.  SALAR MOLDEN was last seen on 10/03/18 by [name] and stress test and echo done were normal.  Since that day, VIVAAN HELSETH has done well and able to meet 4 METS without any chest pain.  OK to hold Asprin for up to 5 days for surgery.  Therefore, based on ACC/AHA guidelines, the patient would be at acceptable risk for the planned procedure without further cardiovascular testing.   I will route this recommendation to the requesting party via Epic fax function and remove from pre-op pool.  Please call with questions.  Cecilie Kicks, NP 01/31/2019, 10:55 AM

## 2019-01-31 NOTE — Telephone Encounter (Signed)
Follow up   Per [name] at [facility], type of anaesthesia:Choice and Femoral Nerve Block Patient does need to stop aspirin as of yesterday.

## 2019-02-04 MED ORDER — DEXTROSE 5 % IV SOLN
3.0000 g | INTRAVENOUS | Status: AC
  Administered 2019-02-05: 3 g via INTRAVENOUS
  Filled 2019-02-04: qty 3

## 2019-02-04 MED ORDER — TRANEXAMIC ACID 1000 MG/10ML IV SOLN
2000.0000 mg | INTRAVENOUS | Status: DC
  Filled 2019-02-04: qty 20

## 2019-02-05 ENCOUNTER — Observation Stay (HOSPITAL_COMMUNITY)
Admission: RE | Admit: 2019-02-05 | Discharge: 2019-02-06 | Disposition: A | Discharge status: Home / Self Care | Payer: Medicare Other | Attending: Orthopaedic Surgery | Admitting: Orthopaedic Surgery

## 2019-02-05 ENCOUNTER — Ambulatory Visit (HOSPITAL_COMMUNITY): Payer: Medicare Other | Admitting: Vascular Surgery

## 2019-02-05 ENCOUNTER — Ambulatory Visit (HOSPITAL_COMMUNITY): Payer: Medicare Other | Admitting: Certified Registered Nurse Anesthetist

## 2019-02-05 ENCOUNTER — Other Ambulatory Visit: Payer: Self-pay

## 2019-02-05 ENCOUNTER — Encounter (HOSPITAL_COMMUNITY)
Admission: RE | Disposition: A | Discharge status: Home / Self Care | Payer: Self-pay | Source: Home / Self Care | Attending: Orthopaedic Surgery

## 2019-02-05 ENCOUNTER — Encounter (HOSPITAL_COMMUNITY): Payer: Self-pay | Admitting: *Deleted

## 2019-02-05 DIAGNOSIS — Z79899 Other long term (current) drug therapy: Secondary | ICD-10-CM | POA: Diagnosis not present

## 2019-02-05 DIAGNOSIS — Z6837 Body mass index (BMI) 37.0-37.9, adult: Secondary | ICD-10-CM | POA: Insufficient documentation

## 2019-02-05 DIAGNOSIS — M1712 Unilateral primary osteoarthritis, left knee: Principal | ICD-10-CM | POA: Insufficient documentation

## 2019-02-05 DIAGNOSIS — K5900 Constipation, unspecified: Secondary | ICD-10-CM | POA: Insufficient documentation

## 2019-02-05 DIAGNOSIS — I10 Essential (primary) hypertension: Secondary | ICD-10-CM | POA: Diagnosis not present

## 2019-02-05 DIAGNOSIS — Z96641 Presence of right artificial hip joint: Secondary | ICD-10-CM | POA: Diagnosis not present

## 2019-02-05 DIAGNOSIS — I2510 Atherosclerotic heart disease of native coronary artery without angina pectoris: Secondary | ICD-10-CM | POA: Diagnosis not present

## 2019-02-05 DIAGNOSIS — Z7982 Long term (current) use of aspirin: Secondary | ICD-10-CM | POA: Insufficient documentation

## 2019-02-05 DIAGNOSIS — G8918 Other acute postprocedural pain: Secondary | ICD-10-CM | POA: Diagnosis not present

## 2019-02-05 DIAGNOSIS — Z87891 Personal history of nicotine dependence: Secondary | ICD-10-CM | POA: Insufficient documentation

## 2019-02-05 DIAGNOSIS — Z96652 Presence of left artificial knee joint: Secondary | ICD-10-CM

## 2019-02-05 DIAGNOSIS — E7800 Pure hypercholesterolemia, unspecified: Secondary | ICD-10-CM | POA: Insufficient documentation

## 2019-02-05 HISTORY — PX: TOTAL KNEE ARTHROPLASTY: SHX125

## 2019-02-05 SURGERY — ARTHROPLASTY, KNEE, TOTAL
Anesthesia: Monitor Anesthesia Care | Site: Knee | Laterality: Left

## 2019-02-05 MED ORDER — PROPOFOL 500 MG/50ML IV EMUL
INTRAVENOUS | Status: DC | PRN
  Administered 2019-02-05: 80 ug/kg/min via INTRAVENOUS
  Administered 2019-02-05: 100 ug/kg/min via INTRAVENOUS

## 2019-02-05 MED ORDER — FENTANYL CITRATE (PF) 100 MCG/2ML IJ SOLN
25.0000 ug | INTRAMUSCULAR | Status: DC | PRN
  Administered 2019-02-05 (×2): 50 ug via INTRAVENOUS

## 2019-02-05 MED ORDER — HYDROMORPHONE HCL 1 MG/ML IJ SOLN
INTRAMUSCULAR | Status: AC
  Filled 2019-02-05: qty 1

## 2019-02-05 MED ORDER — DIPHENHYDRAMINE HCL 12.5 MG/5ML PO ELIX: 12.5000 mg | ORAL_SOLUTION | ORAL | Status: DC | PRN

## 2019-02-05 MED ORDER — PHENOL 1.4 % MT LIQD: 1.0000 | OROMUCOSAL | Status: DC | PRN

## 2019-02-05 MED ORDER — CHLORHEXIDINE GLUCONATE 4 % EX LIQD: 60.0000 mL | Freq: Once | CUTANEOUS | Status: DC

## 2019-02-05 MED ORDER — LACTATED RINGERS IV SOLN
INTRAVENOUS | Status: DC
  Administered 2019-02-05 (×2): via INTRAVENOUS

## 2019-02-05 MED ORDER — MEPERIDINE HCL 50 MG/ML IJ SOLN: 6.2500 mg | INTRAMUSCULAR | Status: DC | PRN

## 2019-02-05 MED ORDER — OXYCODONE HCL 5 MG PO TABS
5.0000 mg | ORAL_TABLET | ORAL | Status: DC | PRN
  Administered 2019-02-05 (×2): 10 mg via ORAL
  Administered 2019-02-06 (×2): 5 mg via ORAL
  Filled 2019-02-05 (×2): qty 2
  Filled 2019-02-05: qty 1

## 2019-02-05 MED ORDER — KETOROLAC TROMETHAMINE 15 MG/ML IJ SOLN
7.5000 mg | Freq: Four times a day (QID) | INTRAMUSCULAR | Status: AC
  Administered 2019-02-05 – 2019-02-06 (×4): 7.5 mg via INTRAVENOUS
  Filled 2019-02-05 (×3): qty 1

## 2019-02-05 MED ORDER — ROSUVASTATIN CALCIUM 20 MG PO TABS: 20.0000 mg | ORAL_TABLET | Freq: Every day | ORAL | Status: DC

## 2019-02-05 MED ORDER — HYDROMORPHONE HCL 1 MG/ML IJ SOLN
0.5000 mg | INTRAMUSCULAR | Status: DC | PRN
  Administered 2019-02-05: 1 mg via INTRAVENOUS

## 2019-02-05 MED ORDER — BISACODYL 10 MG RE SUPP: 10.0000 mg | Freq: Every day | RECTAL | Status: DC | PRN

## 2019-02-05 MED ORDER — METOCLOPRAMIDE HCL 5 MG/ML IJ SOLN: 5.0000 mg | Freq: Three times a day (TID) | INTRAMUSCULAR | Status: DC | PRN

## 2019-02-05 MED ORDER — MAGNESIUM HYDROXIDE 400 MG/5ML PO SUSP: 30.0000 mL | Freq: Every day | ORAL | Status: DC | PRN

## 2019-02-05 MED ORDER — ONDANSETRON HCL 4 MG/2ML IJ SOLN: 4.0000 mg | Freq: Four times a day (QID) | INTRAMUSCULAR | Status: DC | PRN

## 2019-02-05 MED ORDER — ALUM & MAG HYDROXIDE-SIMETH 200-200-20 MG/5ML PO SUSP: 30.0000 mL | ORAL | Status: DC | PRN

## 2019-02-05 MED ORDER — ACETAMINOPHEN 10 MG/ML IV SOLN
1000.0000 mg | Freq: Four times a day (QID) | INTRAVENOUS | Status: AC
  Administered 2019-02-05 – 2019-02-06 (×4): 1000 mg via INTRAVENOUS
  Filled 2019-02-05 (×4): qty 100

## 2019-02-05 MED ORDER — SODIUM CHLORIDE 0.9 % IV SOLN
75.0000 mL/h | INTRAVENOUS | Status: DC
  Administered 2019-02-05: 75 mL/h via INTRAVENOUS

## 2019-02-05 MED ORDER — METHOCARBAMOL 1000 MG/10ML IJ SOLN
500.0000 mg | Freq: Four times a day (QID) | INTRAVENOUS | Status: DC | PRN
  Filled 2019-02-05: qty 5

## 2019-02-05 MED ORDER — SODIUM CHLORIDE 0.9 % IV SOLN: INTRAVENOUS | Status: DC

## 2019-02-05 MED ORDER — ROPIVACAINE HCL 7.5 MG/ML IJ SOLN
INTRAMUSCULAR | Status: DC | PRN
  Administered 2019-02-05: 20 mL via PERINEURAL

## 2019-02-05 MED ORDER — METOCLOPRAMIDE HCL 5 MG PO TABS: 5.0000 mg | ORAL_TABLET | Freq: Three times a day (TID) | ORAL | Status: DC | PRN

## 2019-02-05 MED ORDER — METHOCARBAMOL 500 MG PO TABS
500.0000 mg | ORAL_TABLET | Freq: Four times a day (QID) | ORAL | Status: DC | PRN
  Administered 2019-02-05 – 2019-02-06 (×3): 500 mg via ORAL
  Filled 2019-02-05 (×2): qty 1

## 2019-02-05 MED ORDER — 0.9 % SODIUM CHLORIDE (POUR BTL) OPTIME
TOPICAL | Status: DC | PRN
  Administered 2019-02-05: 1000 mL

## 2019-02-05 MED ORDER — FUROSEMIDE 20 MG PO TABS
20.0000 mg | ORAL_TABLET | ORAL | Status: DC
  Administered 2019-02-06: 20 mg via ORAL
  Filled 2019-02-05: qty 1

## 2019-02-05 MED ORDER — PROPOFOL 1000 MG/100ML IV EMUL
INTRAVENOUS | Status: AC
  Filled 2019-02-05: qty 100

## 2019-02-05 MED ORDER — OXYCODONE HCL 5 MG PO TABS
ORAL_TABLET | ORAL | Status: AC
  Filled 2019-02-05: qty 2

## 2019-02-05 MED ORDER — KETOROLAC TROMETHAMINE 15 MG/ML IJ SOLN
INTRAMUSCULAR | Status: AC
  Filled 2019-02-05: qty 1

## 2019-02-05 MED ORDER — PROPOFOL 10 MG/ML IV BOLUS
INTRAVENOUS | Status: DC | PRN
  Administered 2019-02-05: 20 mg via INTRAVENOUS

## 2019-02-05 MED ORDER — BUPIVACAINE HCL (PF) 0.25 % IJ SOLN
INTRAMUSCULAR | Status: AC
  Filled 2019-02-05: qty 30

## 2019-02-05 MED ORDER — MENTHOL 3 MG MT LOZG: 1.0000 | LOZENGE | OROMUCOSAL | Status: DC | PRN

## 2019-02-05 MED ORDER — PHENYLEPHRINE HCL 10 MG/ML IJ SOLN
INTRAMUSCULAR | Status: DC | PRN
  Administered 2019-02-05 (×3): 80 ug via INTRAVENOUS

## 2019-02-05 MED ORDER — ASPIRIN EC 325 MG PO TBEC
325.0000 mg | DELAYED_RELEASE_TABLET | Freq: Every day | ORAL | Status: DC
  Administered 2019-02-06: 325 mg via ORAL
  Filled 2019-02-05: qty 1

## 2019-02-05 MED ORDER — TRANEXAMIC ACID 1000 MG/10ML IV SOLN
INTRAVENOUS | Status: DC | PRN
  Administered 2019-02-05: 2000 mg via TOPICAL

## 2019-02-05 MED ORDER — DOCUSATE SODIUM 100 MG PO CAPS
100.0000 mg | ORAL_CAPSULE | Freq: Two times a day (BID) | ORAL | Status: DC
  Administered 2019-02-05 – 2019-02-06 (×2): 100 mg via ORAL
  Filled 2019-02-05 (×2): qty 1

## 2019-02-05 MED ORDER — FENTANYL CITRATE (PF) 100 MCG/2ML IJ SOLN
INTRAMUSCULAR | Status: AC
  Administered 2019-02-05: 100 ug
  Filled 2019-02-05: qty 2

## 2019-02-05 MED ORDER — SODIUM CHLORIDE 0.9 % IR SOLN
Status: DC | PRN
  Administered 2019-02-05: 3000 mL

## 2019-02-05 MED ORDER — ISOSORBIDE MONONITRATE ER 60 MG PO TB24
60.0000 mg | ORAL_TABLET | Freq: Every day | ORAL | Status: DC
  Administered 2019-02-06: 60 mg via ORAL
  Filled 2019-02-05: qty 1

## 2019-02-05 MED ORDER — ACETAMINOPHEN 10 MG/ML IV SOLN
1000.0000 mg | Freq: Once | INTRAVENOUS | Status: AC
  Administered 2019-02-05: 1000 mg via INTRAVENOUS
  Filled 2019-02-05: qty 100

## 2019-02-05 MED ORDER — CLONIDINE HCL (ANALGESIA) 100 MCG/ML EP SOLN
EPIDURAL | Status: DC | PRN
  Administered 2019-02-05: 100 ug

## 2019-02-05 MED ORDER — METHOCARBAMOL 500 MG PO TABS
ORAL_TABLET | ORAL | Status: AC
  Filled 2019-02-05: qty 1

## 2019-02-05 MED ORDER — MIDAZOLAM HCL 2 MG/2ML IJ SOLN
INTRAMUSCULAR | Status: AC
  Administered 2019-02-05: 1 mg
  Filled 2019-02-05: qty 2

## 2019-02-05 MED ORDER — ONDANSETRON HCL 4 MG PO TABS: 4.0000 mg | ORAL_TABLET | Freq: Four times a day (QID) | ORAL | Status: DC | PRN

## 2019-02-05 MED ORDER — LIDOCAINE HCL (CARDIAC) PF 100 MG/5ML IV SOSY
PREFILLED_SYRINGE | INTRAVENOUS | Status: DC | PRN
  Administered 2019-02-05: 100 mg via INTRAVENOUS

## 2019-02-05 MED ORDER — NITROGLYCERIN 0.4 MG SL SUBL: 0.4000 mg | SUBLINGUAL_TABLET | SUBLINGUAL | Status: DC | PRN

## 2019-02-05 MED ORDER — FENTANYL CITRATE (PF) 100 MCG/2ML IJ SOLN
INTRAMUSCULAR | Status: AC
  Filled 2019-02-05: qty 2

## 2019-02-05 MED ORDER — CEFAZOLIN SODIUM-DEXTROSE 2-4 GM/100ML-% IV SOLN
2.0000 g | Freq: Four times a day (QID) | INTRAVENOUS | Status: AC
  Administered 2019-02-05 (×2): 2 g via INTRAVENOUS
  Filled 2019-02-05 (×2): qty 100

## 2019-02-05 MED ORDER — LISINOPRIL 40 MG PO TABS
40.0000 mg | ORAL_TABLET | Freq: Every day | ORAL | Status: DC
  Administered 2019-02-05 – 2019-02-06 (×2): 40 mg via ORAL
  Filled 2019-02-05 (×2): qty 1

## 2019-02-05 MED ORDER — FLEET ENEMA 7-19 GM/118ML RE ENEM: 1.0000 | ENEMA | Freq: Once | RECTAL | Status: DC | PRN

## 2019-02-05 SURGICAL SUPPLY — 69 items
BAG DECANTER FOR FLEXI CONT (MISCELLANEOUS) ×2 IMPLANT
BANDAGE ESMARK 6X9 LF (GAUZE/BANDAGES/DRESSINGS) ×1 IMPLANT
BLADE SAGITTAL 25.0X1.19X90 (BLADE) ×2 IMPLANT
BNDG CMPR 9X6 STRL LF SNTH (GAUZE/BANDAGES/DRESSINGS) ×1
BNDG CMPR MED 10X6 ELC LF (GAUZE/BANDAGES/DRESSINGS) ×1
BNDG ELASTIC 6X10 VLCR STRL LF (GAUZE/BANDAGES/DRESSINGS) ×1 IMPLANT
BNDG ESMARK 6X9 LF (GAUZE/BANDAGES/DRESSINGS) ×2
BOWL SMART MIX CTS (DISPOSABLE) ×2 IMPLANT
CEMENT HV SMART SET (Cement) ×4 IMPLANT
CEMENT TIBIA MBT SIZE 4 (Knees) IMPLANT
COMP FEM CEM LRG LT LCS (Orthopedic Implant) ×2 IMPLANT
COMPONENT FEM CEM LRG LT LCS (Orthopedic Implant) IMPLANT
COVER SURGICAL LIGHT HANDLE (MISCELLANEOUS) ×2 IMPLANT
COVER WAND RF STERILE (DRAPES) ×2 IMPLANT
CUFF TOURNIQUET SINGLE 34IN LL (TOURNIQUET CUFF) ×2 IMPLANT
CUFF TOURNIQUET SINGLE 44IN (TOURNIQUET CUFF) IMPLANT
DECANTER SPIKE VIAL GLASS SM (MISCELLANEOUS) ×2 IMPLANT
DRAPE EXTREMITY T 121X128X90 (DISPOSABLE) ×2 IMPLANT
DRAPE HALF SHEET 40X57 (DRAPES) ×4 IMPLANT
DRSG ADAPTIC 3X8 NADH LF (GAUZE/BANDAGES/DRESSINGS) ×2 IMPLANT
DRSG PAD ABDOMINAL 8X10 ST (GAUZE/BANDAGES/DRESSINGS) ×4 IMPLANT
DURAPREP 26ML APPLICATOR (WOUND CARE) ×4 IMPLANT
ELECT CAUTERY BLADE 6.4 (BLADE) ×2 IMPLANT
ELECT REM PT RETURN 9FT ADLT (ELECTROSURGICAL) ×2
ELECTRODE REM PT RTRN 9FT ADLT (ELECTROSURGICAL) ×1 IMPLANT
EVACUATOR 1/8 PVC DRAIN (DRAIN) IMPLANT
FACESHIELD WRAPAROUND (MASK) ×4 IMPLANT
FACESHIELD WRAPAROUND OR TEAM (MASK) ×2 IMPLANT
GAUZE SPONGE 4X4 12PLY STRL (GAUZE/BANDAGES/DRESSINGS) ×2 IMPLANT
GLOVE BIOGEL PI IND STRL 8 (GLOVE) ×1 IMPLANT
GLOVE BIOGEL PI IND STRL 8.5 (GLOVE) ×1 IMPLANT
GLOVE BIOGEL PI INDICATOR 8 (GLOVE) ×1
GLOVE BIOGEL PI INDICATOR 8.5 (GLOVE) ×1
GLOVE ECLIPSE 8.0 STRL XLNG CF (GLOVE) ×4 IMPLANT
GLOVE ECLIPSE 8.5 STRL (GLOVE) ×4 IMPLANT
GOWN STRL REUS W/ TWL LRG LVL3 (GOWN DISPOSABLE) ×2 IMPLANT
GOWN STRL REUS W/TWL 2XL LVL3 (GOWN DISPOSABLE) ×2 IMPLANT
GOWN STRL REUS W/TWL LRG LVL3 (GOWN DISPOSABLE) ×4
HANDPIECE INTERPULSE COAX TIP (DISPOSABLE) ×2
INSERT TIB LCS RP LRG 10 (Knees) ×1 IMPLANT
KIT BASIN OR (CUSTOM PROCEDURE TRAY) ×2 IMPLANT
KIT TURNOVER KIT B (KITS) ×2 IMPLANT
MANIFOLD NEPTUNE II (INSTRUMENTS) ×2 IMPLANT
NEEDLE 22X1 1/2 (OR ONLY) (NEEDLE) ×2 IMPLANT
NS IRRIG 1000ML POUR BTL (IV SOLUTION) ×2 IMPLANT
PACK TOTAL JOINT (CUSTOM PROCEDURE TRAY) ×2 IMPLANT
PAD ABD 8X10 STRL (GAUZE/BANDAGES/DRESSINGS) ×1 IMPLANT
PAD ARMBOARD 7.5X6 YLW CONV (MISCELLANEOUS) ×4 IMPLANT
PAD CAST 4YDX4 CTTN HI CHSV (CAST SUPPLIES) ×1 IMPLANT
PADDING CAST COTTON 4X4 STRL (CAST SUPPLIES) ×2
PADDING CAST COTTON 6X4 STRL (CAST SUPPLIES) ×2 IMPLANT
PEG PATELLA CEM MB COMP LG (Knees) ×1 IMPLANT
PIN STEINMAN FIXATION KNEE (PIN) ×1 IMPLANT
SET HNDPC FAN SPRY TIP SCT (DISPOSABLE) ×1 IMPLANT
STAPLER VISISTAT 35W (STAPLE) ×2 IMPLANT
SUCTION FRAZIER HANDLE 10FR (MISCELLANEOUS) ×1
SUCTION TUBE FRAZIER 10FR DISP (MISCELLANEOUS) ×1 IMPLANT
SURGIFLO W/THROMBIN 8M KIT (HEMOSTASIS) IMPLANT
SUT BONE WAX W31G (SUTURE) ×2 IMPLANT
SUT ETHIBOND NAB CT1 #1 30IN (SUTURE) ×4 IMPLANT
SUT MNCRL AB 3-0 PS2 18 (SUTURE) ×2 IMPLANT
SUT VIC AB 0 CT1 27 (SUTURE) ×2
SUT VIC AB 0 CT1 27XBRD ANBCTR (SUTURE) ×1 IMPLANT
SYR CONTROL 10ML LL (SYRINGE) IMPLANT
TIBIA MBT CEMENT SIZE 4 (Knees) ×2 IMPLANT
TOWEL OR 17X24 6PK STRL BLUE (TOWEL DISPOSABLE) ×2 IMPLANT
TOWEL OR 17X26 10 PK STRL BLUE (TOWEL DISPOSABLE) ×2 IMPLANT
TRAY FOLEY BAG SILVER LF 16FR (SET/KITS/TRAYS/PACK) ×2 IMPLANT
WRAP KNEE MAXI GEL POST OP (GAUZE/BANDAGES/DRESSINGS) ×2 IMPLANT

## 2019-02-05 NOTE — Anesthesia Postprocedure Evaluation (Signed)
Anesthesia Post Note  Patient: Christian [name]  Procedure(s) Performed: LEFT TOTAL KNEE ARTHROPLASTY (Left Knee)     Patient location during evaluation: PACU Anesthesia Type: MAC and Spinal Level of consciousness: awake and alert Pain management: pain level controlled Vital Signs Assessment: post-procedure vital signs reviewed and stable Respiratory status: spontaneous breathing, nonlabored ventilation, respiratory function stable and patient connected to nasal cannula oxygen Cardiovascular status: stable and blood pressure returned to baseline Postop Assessment: no apparent nausea or vomiting Anesthetic complications: no    Last Vitals:  Vitals:   02/05/19 1330 02/05/19 1338  BP:  (!) 149/78  Pulse: (!) 54 (!) 55  Resp: 10   Temp:    SpO2: 95% 96%    Last Pain:  Vitals:   02/05/19 1338  TempSrc:   PainSc: Asleep                 Summerlyn Fickel

## 2019-02-05 NOTE — Progress Notes (Signed)
Physical Therapy Evaluation Patient Details Name: Christian [name] MRN: [id number] DOB: 1946-10-11 Today's Date: 02/05/2019   History of Present Illness  Patient is 73 y/o male s/p L TKA. PMH includes CAD, HTN, orthostatic hypotension, diastolic dysfunction, hx of R THA and R TKA.   Clinical Impression  Patient admitted to hospital secondary to problems above and with deficits below. Patient required minA to stand from elevated surface and supervision-min guard to ambulate with use of RW. Educated and reviewed supine HEP and knee precautions/weight bearing restrictions with patient. Patient will benefit from acute physical therapy services to maximize independence and safety with functional mobility.      Follow Up Recommendations Follow surgeon's recommendation for DC plan and follow-up therapies    Equipment Recommendations  None recommended by PT    Recommendations for Other Services       Precautions / Restrictions Precautions Precautions: Knee;Fall Precaution Booklet Issued: Yes (comment) Precaution Comments: reviewed knee precautions with patient Restrictions Weight Bearing Restrictions: Yes LLE Weight Bearing: Partial weight bearing LLE Partial Weight Bearing Percentage or Pounds: 50%      Mobility  Bed Mobility Overal bed mobility: Needs Assistance Bed Mobility: Supine to Sit     Supine to sit: Supervision     General bed mobility comments: Patient required supervision for bed mobility for safety. Reports lightheadedness in sitting that subsided  Transfers Overall transfer level: Needs assistance Equipment used: Rolling walker (2 wheeled) Transfers: Sit to/from Stand Sit to Stand: Min assist;From elevated surface         General transfer comment: Patient required minA to stand for lift assist from elevated surface. Verbal cues to push up from bed and not RW.   Ambulation/Gait Ambulation/Gait assistance: Supervision;Min guard Gait Distance (Feet): 75  Feet Assistive device: Rolling walker (2 wheeled) Gait Pattern/deviations: Step-to pattern;Decreased step length - right;Decreased stance time - left;Decreased stride length;Decreased weight shift to left;Antalgic Gait velocity: decreased Gait velocity interpretation: <1.8 ft/sec, indicate of risk for recurrent falls General Gait Details: Patient ambulated with min guard to supervision for safety with use of RW. Patient with no LOB or knee buckling noted.   Stairs            Wheelchair Mobility    Modified Rankin (Stroke Patients Only)       Balance Overall balance assessment: Needs assistance Sitting-balance support: Feet supported;No upper extremity supported Sitting balance-[name]: Good     Standing balance support: Bilateral upper extremity supported Standing balance-[name]: Poor Standing balance comment: reliant on BUE support and use of RW to maintain standing balance                             Pertinent Vitals/Pain Pain Assessment: 0-10 Pain Score: 1  Pain Location: L knee Pain Descriptors / Indicators: Discomfort;Operative site guarding;Guarding Pain Intervention(s): Limited activity within patient's tolerance;Monitored during session;Repositioned    Home Living Family/patient expects to be discharged to:: Private residence Living Arrangements: Spouse/significant other;Children;Other relatives Available Help at Discharge: Family;Available 24 hours/day Type of Home: House Home Access: Stairs to enter Entrance Stairs-Rails: None Entrance Stairs-Number of Steps: 3 Home Layout: Two level;Able to live on main level with bedroom/bathroom Home Equipment: Gilford Rile - 2 wheels;Shower seat;Bedside commode;Cane - single point      Prior Function Level of Independence: Independent               Hand Dominance        Extremity/Trunk Assessment  Upper Extremity Assessment Upper Extremity Assessment: Overall WFL for tasks assessed     Lower Extremity Assessment Lower Extremity Assessment: LLE deficits/detail LLE Deficits / Details: LLE deficits consistent with post op pain and weakness    Cervical / Trunk Assessment Cervical / Trunk Assessment: Normal  Communication   Communication: No difficulties  Cognition Arousal/Alertness: Awake/alert Behavior During Therapy: WFL for tasks assessed/performed Overall Cognitive Status: Within Functional Limits for tasks assessed                                        General Comments General comments (skin integrity, edema, etc.): Patient son in law in room during session    Exercises Total Joint Exercises Ankle Circles/Pumps: AROM;Both;20 reps;Supine Quad Sets: AROM;Left;10 reps;Supine Heel Slides: AROM;Left;10 reps;Supine   Assessment/Plan    PT Assessment Patient needs continued PT services  PT Problem List Decreased strength;Decreased range of motion;Decreased activity tolerance;Decreased balance;Decreased mobility;Decreased knowledge of use of DME;Decreased knowledge of precautions;Pain       PT Treatment Interventions DME instruction;Gait training;Stair training;Functional mobility training;Therapeutic activities;Therapeutic exercise;Balance training;Patient/family education    PT Goals (Current goals can be found in the Care Plan section)  Acute Rehab PT Goals Patient Stated Goal: go home tomorrow PT Goal Formulation: With patient Time For Goal Achievement: 02/19/19 Potential to Achieve Goals: Good    Frequency 7X/week   Barriers to discharge        Co-evaluation               AM-PAC PT "6 Clicks" Mobility  Outcome Measure Help needed turning from your back to your side while in a flat bed without using bedrails?: None Help needed moving from lying on your back to sitting on the side of a flat bed without using bedrails?: None Help needed moving to and from a bed to a chair (including a wheelchair)?: A Little Help needed  standing up from a chair using your arms (e.g., wheelchair or bedside chair)?: A Little Help needed to walk in hospital room?: A Little Help needed climbing 3-5 steps with a railing? : A Lot 6 Click Score: 19    End of Session Equipment Utilized During Treatment: Gait belt Activity Tolerance: Patient tolerated treatment well Patient left: in chair;with call bell/phone within reach;with family/visitor present Nurse Communication: Mobility status PT Visit Diagnosis: Muscle weakness (generalized) (M62.81);Difficulty in walking, not elsewhere classified (R26.2);Pain Pain - Right/Left: Left Pain - part of body: Knee    Time: 9021-1155 PT Time Calculation (min) (ACUTE ONLY): 25 min   Charges:   PT Evaluation $PT Eval Low Complexity: 1 Low PT Treatments $Gait Training: 8-22 mins        Erick Blinks, SPT  Erick Blinks 02/05/2019, 6:50 PM

## 2019-02-05 NOTE — Progress Notes (Signed)
No cpm units currently available per ortho tech//jennifer. Will place name on list

## 2019-02-05 NOTE — Progress Notes (Signed)
Orthopedic Tech Progress Note Patient Details:  Christian [name] [birth year] [id number] [employer]      Post Interventions Instructions Provided: Adjustment of device, Care of device   [name] [name] 02/05/2019, 4:09 PM

## 2019-02-05 NOTE — Anesthesia Preprocedure Evaluation (Signed)
Anesthesia Evaluation  Patient identified by MRN, date of birth, ID band Patient awake    Reviewed: Allergy & Precautions, NPO status , Patient's Chart, lab work & pertinent test results  Airway Mallampati: II  TM Distance: >3 FB Neck ROM: Full    Dental  (+) Dental Advisory Given   Pulmonary former smoker,    breath sounds clear to auscultation- rhonchi       Cardiovascular hypertension, Pt. on medications + CAD   Rhythm:Regular Rate:Normal  EKG: 01/29/19:  Normal sinus rhythm with sinus arrhythmia Inferior infarct , age undetermined Abnormal ECG  CV: Nuclear stress test 10/16/18: Nuclear stress EF: 56%. There was no ST segment deviation noted during stress. The left ventricular ejection fraction is normal (55-65%). This is a low risk study.  Echo 10/16/18: Study Conclusions - Left ventricle: The cavity size was normal. Wall thickness was normal. Systolic function was normal. The estimated ejection fraction was in the range of 60% to 65%. Wall motion was normal; there were no regional wall motion abnormalities. Doppler parameters are consistent with abnormal left ventricular relaxation (grade 1 diastolic dysfunction). - Aortic valve: Mildly to moderately calcified annulus. Mildly thickened leaflets. There was mild regurgitation. - Left atrium: The atrium was mildly dilated. - Right atrium: The atrium was mildly dilated.  48 hour Holter monitor 12/27/16:  Normal sinus rhythm with heart rate ranging from 50 to 112bpm. The average heart rate was 69bpm.  Frequent PACs and nonsustained atrial tachycardia up to 6-9 beats.  Frequent PVCs, bigeminal PVCs and trigeminal PVCs  Cardiac cath 12/06/16:  The left ventricular ejection fraction is 45-50% by visual estimate.  The left ventricular systolic function is normal.  Prox RCA to Dist RCA lesion, 20 %stenosed.  Ost 1st Mrg to 1st Mrg lesion, 50  %stenosed.  Mid Cx to Dist Cx lesion, 35 %stenosed.  Ost 1st Diag to 1st Diag lesion, 70 %stenosed.  Mid LAD to Dist LAD lesion, 65 %stenosed.  Dist LAD lesion, 70 %stenosed.  There are no severe coronary obstructions. The mid and distal LAD contained eccentric 60-70% stenoses. The first agonal contains segmental proximal 70% narrowing. Irregularities are noted in the mid circumflex and proximal to mid RCA.  Low normal LV function with EF of proximally 45-50% with mildly elevated LVEDP of 20 mmHg.  Frequent PVCs, uniform during monitoring/procedure.   Neuro/Psych negative neurological ROS     GI/Hepatic negative GI ROS, Neg liver ROS,   Endo/Other  Morbid obesity  Renal/GU negative Renal ROS     Musculoskeletal  (+) Arthritis ,   Abdominal   Peds  Hematology negative hematology ROS (+)   Anesthesia Other Findings   Reproductive/Obstetrics                             Lab Results  Component Value Date   WBC 7.4 01/29/2019   HGB 13.9 01/29/2019   HCT 41.6 01/29/2019   MCV 92.4 01/29/2019   PLT 216 01/29/2019   Lab Results  Component Value Date   CREATININE 1.21 01/29/2019   BUN 15 01/29/2019   NA 140 01/29/2019   K 4.3 01/29/2019   CL 106 01/29/2019   CO2 24 01/29/2019    Anesthesia Physical  Anesthesia Plan  ASA: III  Anesthesia Plan: Spinal and MAC   Post-op Pain Management:  Regional for Post-op pain   Induction: Intravenous  PONV Risk Score and Plan: 1 and Treatment may vary due to age  or medical condition  Airway Management Planned: Nasal Cannula  Additional Equipment:   Intra-op Plan:   Post-operative Plan: Extubation in OR  Informed Consent: I have reviewed the patients History and Physical, chart, labs and discussed the procedure including the risks, benefits and alternatives for the proposed anesthesia with the patient or authorized representative who has indicated his/her understanding and acceptance.      Dental advisory given  Plan Discussed with: CRNA, Anesthesiologist and Surgeon  Anesthesia Plan Comments: (  )        Anesthesia Quick Evaluation

## 2019-02-05 NOTE — Op Note (Signed)
NAME: Christian [name], Christian [name] MEDICAL RECORD [id number] ACCOUNT [id number] DATE OF BIRTH:1946/04/12 FACILITY: [facility] LOCATION: [place], [place]  OPERATIVE REPORT  DATE OF PROCEDURE:  02/05/2019  PREOPERATIVE DIAGNOSIS:  End-stage osteoarthritis, left knee.  POSTOPERATIVE DIAGNOSIS:  End-stage osteoarthritis, left knee.  PROCEDURE:  Left total knee replacement.  SURGEON:  [name], MD  ASSISTANT:  Biagio Borg, PA-C, was present throughout the operative procedure to ensure its timely completion.  ANESTHESIA:  Spinal with adductor canal block and IV sedation.  COMPLICATIONS:  None.  COMPONENTS:  DePuy LCS large femoral component, a #4 rotating keeled tibial tray with a 10 mm polyethylene bridging bearing and metal backed 3 peg rotating patella.  Components were secured with polymethyl methacrylate.  DESCRIPTION OF PROCEDURE:  The patient was met in the holding area with his wife.  I marked the left knee as the appropriate operative site.  Anesthesia performed an adductor canal block.  The patient was then transported to room 6.  Spinal anesthetic  was administered by anesthesia with IV sedation.  The patient was then placed comfortably on the operating table in the supine position.  Tourniquet was applied to the left thigh.  The left lower extremity was prepped with chlorhexidine scrub and DuraPrep x2.  Sterile draping was performed.  Timeout was called.  The left lower extremity was then elevated and Esmarch exsanguinated with a proximal tourniquet at 350 mmHg.  A midline longitudinal incision was made centered about the patella extending from the superior pouch to the tibial tubercle.  First layer of capsule was incised in the midline.  A medial parapatellar incision was then made with the Bovie.  The joint was  entered.  There was about a 20 mL clear yellow joint effusion.  Patella was everted 180 degrees laterally and the knee flexed to 90 degrees.   There were large osteophytes along the medial and lateral femoral condyle.  These were removed and I sized the large femoral component.  There was a moderate amount of beefy  red synovitis.  A synovectomy was performed.  Alignment was neutral.  First bony cut was then made transversely in the proximal tibia with a 7 degree angle of declination.  After each bony cut on the tibia and the femur, I checked the alignment with the external guide.  Subsequent cuts were then made on the femur using the large femoral jig.  I used a 4 degree distal femoral valgus cut.  Laminar spreaders were then inserted along the medial and lateral compartments.  I removed medial and lateral menisci as well as ACL  and PCL.  Osteophytes were removed from the posterior femoral condyle with a 3/4 inch curved osteotome.  Again LCL and MCL remained intact.  My flexion and extension gaps were symmetrical at 10 mm.  Final cut was then made on the femur for tapering cuts and for the center hole.  Retractors then placed about the tibia was advanced anteriorly.  I measured a 4 tibial tray.  This was pinned in place.  Center hole was then made followed by the keeled cut.  With the trial tibial jig in place, I applied the 10 mm polyethylene bridging bearing and then the trial femoral component.  This was reduced and through a full range of motion remained stable.  I measured the patella at about 24 mm.  I removed 10 mm, leaving about 13 mm of patella thickness.  Three holes were then made.  Trial patella inserted and through a full  range of motion after reduction remained stable.  The trial components were then removed.  I copiously irrigated the joint with saline solution.  We changed gloves.  Final components were then impacted with polymethyl methacrylate.  We initially applied the tibia followed by the 10 mm polyethylene  Marathon bridging bearing followed by the large femoral component.  These were reduced.  Any extraneous  methacrylate was removed from the periphery of the components.  Patella was applied with methacrylate and a clamp.  At approximately 16 minutes, the methacrylate had matured.  During this time, we injected the deep capsule with 0.25% Marcaine with epinephrine.  We irrigated the joint with saline solution.  At approximately 72 minutes, the tourniquet was released.   There was immediate capillary refill to the joint.  Gross bleeders were Bovie coagulated.  I applied the tranexamic acid topically under compression for over 5 minutes and thought I had a nice dry field.  Deep capsule was closed with a #1 Ethibond superficial capsule closed with 0 Vicryl, subcutaneous in several layers with Vicryl and 3-0 Monocryl.  Skin closed with skin clips.  Sterile bulky dressing was applied followed by the patient's support  stocking.  The patient tolerated the procedure without complications.  TN/NUANCE  D:02/05/2019 T:02/05/2019 JOB:[id number]

## 2019-02-05 NOTE — Progress Notes (Signed)
cpm available and placed per ortho tech

## 2019-02-05 NOTE — Op Note (Signed)
PATIENT ID:      Christian [name]  MRN:     [id number] DOB/AGE:    Nov 28, 1946 / 73 y.o.       OPERATIVE REPORT    DATE OF PROCEDURE:  02/05/2019       PREOPERATIVE DIAGNOSIS: END STAGE  LEFT KNEE OSTEOARTHRITIS                                                       Estimated body mass index is 37.03 kg/m as calculated from the following:   Height as of 01/29/19: 5' 11.75" (1.822 m).   Weight as of 01/29/19: 123 kg.     POSTOPERATIVE DIAGNOSIS:   END STAGE OSTEOARTHRITIS LEFT KNEE                                                                     Estimated body mass index is 37.03 kg/m as calculated from the following:   Height as of 01/29/19: 5' 11.75" (1.822 m).   Weight as of 01/29/19: 123 kg.     PROCEDURE:  Procedure(s): LEFT TOTAL KNEE ARTHROPLASTY      SURGEON:  [name], MD    ASSISTANT:   Biagio Borg, PA-C   (Present and scrubbed throughout the case, critical for assistance with exposure, retraction, instrumentation, and closure.)          ANESTHESIA: regional, spinal and IV sedation     DRAINS: none :      TOURNIQUET TIME: * Missing tourniquet times found for documented tourniquets in log: [id number] *    COMPLICATIONS:  None   CONDITION:  stable  PROCEDURE IN DETAIL: [place] 02/05/2019, 12:07 PM

## 2019-02-05 NOTE — Anesthesia Procedure Notes (Signed)
Anesthesia Regional Block: Adductor canal block   Pre-Anesthetic Checklist: ,, timeout performed, Correct Patient, Correct Site, Correct Laterality, Correct Procedure, Correct Position, site marked, Risks and benefits discussed,  Surgical consent,  Pre-op evaluation,  At surgeon's request and post-op pain management  Laterality: Left  Prep: chloraprep       Needles:  Injection technique: Single-shot  Needle Type: Echogenic Stimulator Needle     Needle Length: 5cm  Needle Gauge: 22     Additional Needles:   Procedures:, nerve stimulator,,, ultrasound used (permanent image in chart),,,,  Narrative:  Start time: 02/05/2019 9:14 AM End time: 02/05/2019 9:20 AM Injection made incrementally with aspirations every 5 mL.  Performed by: Personally  Anesthesiologist: Janeece Riggers, MD  Additional Notes: Functioning IV was confirmed and monitors were applied.  A 43mm 22ga Arrow echogenic stimulator needle was used. Sterile prep and drape,hand hygiene and sterile gloves were used. Ultrasound guidance: relevant anatomy identified, needle position confirmed, local anesthetic spread visualized around nerve(s)., vascular puncture avoided.  Image printed for medical record. Negative aspiration and negative test dose prior to incremental administration of local anesthetic. The patient tolerated the procedure well.

## 2019-02-05 NOTE — Transfer of Care (Signed)
Immediate Anesthesia Transfer of Care Note  Patient: Christian [name]  Procedure(s) Performed: LEFT TOTAL KNEE ARTHROPLASTY (Left Knee)  Patient Location: PACU  Anesthesia Type:Spinal and MAC combined with regional for post-op pain  Level of Consciousness: awake and alert   Airway & Oxygen Therapy: Patient Spontanous Breathing  Post-op Assessment: Report given to RN and Post -op Vital signs reviewed and stable  Post vital signs: Reviewed and stable  Last Vitals:  Vitals Value Taken Time  BP 106/77 02/05/2019 12:33 PM  Temp    Pulse 63 02/05/2019 12:35 PM  Resp 20 02/05/2019 12:35 PM  SpO2 95 % 02/05/2019 12:35 PM  Vitals shown include unvalidated device data.  Last Pain:  Vitals:   02/05/19 0809  TempSrc:   PainSc: 1          Complications: No apparent anesthesia complications

## 2019-02-05 NOTE — Plan of Care (Signed)
  Problem: Health Behavior/Discharge Planning: Goal: Ability to manage health-related needs will improve Outcome: Progressing   Problem: Clinical Measurements: Goal: Ability to maintain clinical measurements within normal limits will improve Outcome: Progressing   Problem: Education: Goal: Knowledge of General Education information will improve Description: Including pain rating scale, medication(s)/side effects and non-pharmacologic comfort measures Outcome: Progressing   

## 2019-02-06 ENCOUNTER — Encounter (HOSPITAL_COMMUNITY): Payer: Self-pay | Admitting: Orthopaedic Surgery

## 2019-02-06 DIAGNOSIS — K5900 Constipation, unspecified: Secondary | ICD-10-CM | POA: Diagnosis not present

## 2019-02-06 DIAGNOSIS — E7800 Pure hypercholesterolemia, unspecified: Secondary | ICD-10-CM | POA: Diagnosis not present

## 2019-02-06 DIAGNOSIS — M1712 Unilateral primary osteoarthritis, left knee: Secondary | ICD-10-CM | POA: Diagnosis not present

## 2019-02-06 DIAGNOSIS — I10 Essential (primary) hypertension: Secondary | ICD-10-CM | POA: Diagnosis not present

## 2019-02-06 DIAGNOSIS — I2510 Atherosclerotic heart disease of native coronary artery without angina pectoris: Secondary | ICD-10-CM | POA: Diagnosis not present

## 2019-02-06 LAB — BASIC METABOLIC PANEL
Anion gap: 9 (ref 5–15)
BUN: 17 mg/dL (ref 8–23)
CO2: 21 mmol/L — ABNORMAL LOW (ref 22–32)
Calcium: 8.1 mg/dL — ABNORMAL LOW (ref 8.9–10.3)
Chloride: 104 mmol/L (ref 98–111)
Creatinine, Ser: 1.16 mg/dL (ref 0.61–1.24)
GFR calc Af Amer: >60 mL/min (ref >60)
GFR calc non Af Amer: >60 mL/min (ref >60)
Glucose, Bld: 103 mg/dL — ABNORMAL HIGH (ref 70–99)
Potassium: 3.6 mmol/L (ref 3.5–5.1)
Sodium: 134 mmol/L — ABNORMAL LOW (ref 135–145)

## 2019-02-06 LAB — CBC
HCT: 36 % — ABNORMAL LOW (ref 39.0–52.0)
HEMOGLOBIN: 11.9 g/dL — AB (ref 13.0–17.0)
MCH: 30.2 pg (ref 26.0–34.0)
MCHC: 33.1 g/dL (ref 30.0–36.0)
MCV: 91.4 fL (ref 80.0–100.0)
Platelets: 163 10*3/uL (ref 150–400)
RBC: 3.94 MIL/uL — ABNORMAL LOW (ref 4.22–5.81)
RDW: 12.9 % (ref 11.5–15.5)
WBC: 6 10*3/uL (ref 4.0–10.5)
nRBC: 0 % (ref 0.0–0.2)

## 2019-02-06 MED ORDER — METHOCARBAMOL 500 MG PO TABS: 500.0000 mg | ORAL_TABLET | Freq: Four times a day (QID) | ORAL | 1 refills | Status: DC | PRN

## 2019-02-06 MED ORDER — OXYCODONE HCL 5 MG PO TABS: 5.0000 mg | ORAL_TABLET | ORAL | 0 refills | Status: DC | PRN

## 2019-02-06 NOTE — Op Note (Signed)
PATIENT ID: Christian [name]        MRN:  [id number]          DOB/AGE: 1946/10/07 / 73 y.o.    [name], MD   Biagio Borg, PA-C [address] [place], [place]  [address]                             [phone]   PROGRESS NOTE  Subjective:  negative for Chest Pain  negative for Shortness of Breath  negative for Nausea/Vomiting   negative for Calf Pain    Tolerating Diet: yes         Patient reports pain as mild.     Comfortable this am-no complaints  Objective: Vital signs in last 24 hours:    Patient Vitals for the past 24 hrs:  BP Temp Temp src Pulse Resp SpO2 Height Weight  02/06/19 0442 126/78 98.4 F (36.9 C) Oral (!) 109 17 95 % - -  02/06/19 0034 122/81 99.1 F (37.3 C) Oral 73 17 97 % - -  02/05/19 2058 117/67 98.1 F (36.7 C) Oral 71 17 97 % - -  02/05/19 1629 131/84 98.4 F (36.9 C) Oral 64 18 97 % 5' 11.75" (1.822 m) 123 kg  02/05/19 1600 - - - 60 12 96 % - -  02/05/19 1545 132/80 - - 63 10 97 % - -  02/05/19 1530 - - - 61 17 98 % - -  02/05/19 1515 - - - 61 12 97 % - -  02/05/19 1500 - - - (!) 58 13 99 % - -  02/05/19 1445 - - - (!) 58 15 94 % - -  02/05/19 1429 134/77 - - (!) 59 - 97 % - -  02/05/19 1338 (!) 149/78 - - (!) 55 - 96 % - -  02/05/19 1330 - - - (!) 54 10 95 % - -  02/05/19 1300 - - - 67 15 100 % - -  02/05/19 1248 109/74 - - 63 - 96 % - -  02/05/19 1232 106/77 98 F (36.7 C) - 63 - - - -      Intake/Output from previous day:   02/11 0701 - 02/12 0700 In: 3033.2 [P.O.:480; I.V.:2153.2] Out: 400 [Urine:200]   Intake/Output this shift:   No intake/output data recorded.   Intake/Output      02/11 0701 - 02/12 0700 02/12 0701 - 02/13 0700   P.O. 480    I.V. (mL/kg) 2153.2 (17.5)    Other 0    IV Piggyback 400    Total Intake(mL/kg) 3033.2 (24.7)    Urine (mL/kg/hr) 200    Blood 200    Total Output 400    Net +2633.2            LABORATORY DATA: Recent Labs    02/06/19 0205  WBC 6.0  HGB 11.9*  HCT 36.0*  PLT  163   Recent Labs    02/06/19 0205  NA 134*  K 3.6  CL 104  CO2 21*  BUN 17  CREATININE 1.16  GLUCOSE 103*  CALCIUM 8.1*   Lab Results  Component Value Date   INR 1.03 01/29/2019   INR 1.0 12/05/2016   INR 1.08 09/11/2015    Recent Radiographic Studies :  Dg Chest 2 View  Result Date: 01/29/2019 CLINICAL DATA:  Preoperative evaluation for upcoming knee surgery EXAM: CHEST - 2 VIEW COMPARISON:  10/01/2018 FINDINGS: Cardiac shadow is within normal limits. The lungs are hyperaerated bilaterally. No focal infiltrate or sizable effusion is noted. No acute bony abnormality is seen. Multilevel degenerative changes in the thoracic spine are noted. IMPRESSION: No active cardiopulmonary disease. Electronically Signed   By: Inez Catalina M.D.   On: 01/29/2019 09:24   Xr Knee 3 View Left  Result Date: 01/14/2019 Films of the left knee were obtained in 3 projections standing and compared to films were performed 2 years ago.  There is progressive degenerative change about the patellofemoral joint and the lateral joint space where there is narrowing and peripheral osteophytes.  There is some narrowing of the medial compartment with subchondral sclerosis and cyst change.  All of the changes are progressive since the films performed in 2018    Examination:  General appearance: alert, cooperative and no distress  Wound Exam: clean, dry, intact -dressing changed  Drainage:  None: wound tissue dry  Motor Exam: EHL, FHL, Anterior Tibial and Posterior Tibial Intact  Sensory Exam: Superficial Peroneal, Deep Peroneal and Tibial normal  Vascular Exam: Normal  Assessment:    1 Day Post-Op  Procedure(s) (LRB): LEFT TOTAL KNEE ARTHROPLASTY (Left)  ADDITIONAL DIAGNOSIS:  Principal Problem:   Primary osteoarthritis of left knee Active Problems:   Total knee replacement status, left     Plan: Physical Therapy as ordered Partial Weight Bearing @ 50% (PWB)  DVT Prophylaxis:  Aspirin, Foot  Pumps and TED hose  DISCHARGE PLAN: Home  DISCHARGE NEEDS: HHPT, CPM, Walker and 3-in-1 comode seat   Patient's anticipated LOS is less than 2 midnights, meeting these requirements: - Younger than 39 - Lives within 1 hour of care - Has a competent adult at home to recover with post-op recover - NO history of  - Chronic pain requiring opiods  - Diabetes  - Coronary Artery Disease  - Heart failure  - Heart attack  - Stroke  - DVT/VTE  - Cardiac arrhythmia  - Respiratory Failure/COPD  - Renal failure  - Anemia  - Advanced Liver disease  dressing changed left knee-wound clean and dry. OOB with PT, foley out and voiding without problem, discharge today          Biagio Borg, Hershal [place]  02/06/2019 8:06 AM

## 2019-02-06 NOTE — Progress Notes (Signed)
Physical Therapy Treatment Patient Details Name: Christian [name] MRN: [id number] DOB: 06-06-1946 Today's Date: 02/06/2019    History of Present Illness Patient is 73 y/o male s/p L TKA. PMH includes CAD, HTN, orthostatic hypotension, diastolic dysfunction, hx of R THA and R TKA.     PT Comments    Patient seen for LE therex and HEP handout review. Stair handout given as well to pt's wife. Current plan remains appropriate.    Follow Up Recommendations  Follow surgeon's recommendation for DC plan and follow-up therapies     Equipment Recommendations  None recommended by PT    Recommendations for Other Services       Precautions / Restrictions Precautions Precautions: Knee;Fall Precaution Comments: reviewed knee precautions/positioning with patient and wife Restrictions Weight Bearing Restrictions: Yes LLE Weight Bearing: Partial weight bearing LLE Partial Weight Bearing Percentage or Pounds: 50%    Mobility  Bed Mobility            General bed mobility comments: pt OOB in chair upon arrival  Transfers  Ambulation/Gait    Stairs    Wheelchair Mobility    Modified Rankin (Stroke Patients Only)       Balance Overall balance assessment: Needs assistance Sitting-balance support: Feet supported;No upper extremity supported Sitting balance-[name]: Good     Standing balance support:  Standing balance-[name]:                               Cognition Arousal/Alertness: Awake/alert Behavior During Therapy: WFL for tasks assessed/performed Overall Cognitive Status: Within Functional Limits for tasks assessed                                        Exercises Total Joint Exercises Quad Sets: AROM;Strengthening;Left Short Arc Quad: AAROM;AROM;Strengthening;Left Heel Slides: AAROM;Strengthening;Left Hip ABduction/ADduction: AAROM;Strengthening;Left Straight Leg Raises: AAROM;Strengthening;Left Long Arc Quad:  AROM;AAROM;Strengthening;Left Knee Flexion: AROM;AAROM;Left    General Comments General comments (skin integrity, edema, etc.): wife present      Pertinent Vitals/Pain Pain Assessment: Faces Faces Pain Scale: Hurts even more Pain Location: L knee Pain Descriptors / Indicators: Guarding;Grimacing Pain Intervention(s): Limited activity within patient's tolerance;Monitored during session;Repositioned;Patient requesting pain meds-RN notified;RN gave pain meds during session    Home Living                      Prior Function            PT Goals (current goals can now be found in the care plan section) Progress towards PT goals: Progressing toward goals    Frequency    7X/week      PT Plan Current plan remains appropriate    Co-evaluation              AM-PAC PT "6 Clicks" Mobility   Outcome Measure  Help needed turning from your back to your side while in a flat bed without using bedrails?: None Help needed moving from lying on your back to sitting on the side of a flat bed without using bedrails?: None Help needed moving to and from a bed to a chair (including a wheelchair)?: A Little Help needed standing up from a chair using your arms (e.g., wheelchair or bedside chair)?: A Little Help needed to walk in hospital room?: A Little Help needed climbing 3-5 steps with a railing? :  A Little 6 Click Score: 20    End of Session Equipment Utilized During Treatment: Gait belt Activity Tolerance: Patient tolerated treatment well Patient left: in chair;with call bell/phone within reach;with family/visitor present Nurse Communication: Mobility status PT Visit Diagnosis: Muscle weakness (generalized) (M62.81);Difficulty in walking, not elsewhere classified (R26.2);Pain Pain - Right/Left: Left Pain - part of body: Knee     Time: 4884-5733 PT Time Calculation (min) (ACUTE ONLY): 22 min  Charges:   $Therapeutic Exercise: 8-22 mins                     Earney Navy, PTA Acute Rehabilitation Services Pager: [phone] Office: [phone]     Darliss Cheney 02/06/2019, 1:33 PM

## 2019-02-06 NOTE — Care Management CC44 (Signed)
Condition Code 44 Documentation Completed  Patient Details  Name: RYLEND PIETRZAK MRN: [id number] Date of Birth: 1946/03/10   Condition Code 44 given:  Yes Patient signature on Condition Code 44 notice:  Yes Documentation of 2 MD's agreement:  Yes Code 44 added to claim:  Yes    Ninfa Meeker, RN 02/06/2019, 11:56 AM

## 2019-02-06 NOTE — Care Management Obs Status (Signed)
[facility] NOTIFICATION   Patient Details  Name: MAESON PUROHIT MRN: [id number] Date of Birth: April 01, 1946   Medicare Observation Status Notification Given:  Yes    Ninfa Meeker, RN 02/06/2019, 11:56 AM

## 2019-02-06 NOTE — Progress Notes (Signed)
Discharge instructions completed with pt.  Pt verbalized understanding of the information.  Pt denies chest pain, shortness of breath, dizziness, lightheadedness, and n/v.  Pt's IV discontinued.  Pt discharged home.  

## 2019-02-06 NOTE — Progress Notes (Signed)
Physical Therapy Treatment Patient Details Name: DHRUV CHRISTINA MRN: [id number] DOB: [birth year] Today's Date: 02/06/2019    History of Present Illness Patient is 73 y/o male s/p L TKA. PMH includes CAD, HTN, orthostatic hypotension, diastolic dysfunction, hx of R THA and R TKA.     PT Comments    Patient seen for mobility progression. Pt is making progress toward PT goals and tolerated gait and stair training well. Wife present throughout and assisted with ascending/descending steps simulating home entrance. Plan one more PT session for therex and HEP handout review prior to d/c home with wife's assistance. Current plan remains appropriate.    Follow Up Recommendations  Follow surgeon's recommendation for DC plan and follow-up therapies     Equipment Recommendations  None recommended by PT    Recommendations for Other Services       Precautions / Restrictions Precautions Precautions: Knee;Fall Precaution Comments: reviewed knee precautions/positioning with patient and wife Restrictions Weight Bearing Restrictions: Yes LLE Weight Bearing: Partial weight bearing LLE Partial Weight Bearing Percentage or Pounds: 50%    Mobility  Bed Mobility Overal bed mobility: Modified Independent Bed Mobility: Supine to Sit           General bed mobility comments: increased time and effort   Transfers Overall transfer level: Needs assistance Equipment used: Rolling walker (2 wheeled) Transfers: Sit to/from Stand Sit to Stand: From elevated surface;Min guard         General transfer comment: min guard for safety; cues for safe hand placement  Ambulation/Gait Ambulation/Gait assistance: Supervision Gait Distance (Feet): 200 Feet Assistive device: Rolling walker (2 wheeled) Gait Pattern/deviations: Decreased stance time - left;Decreased stride length;Decreased weight shift to left;Antalgic;Step-through pattern;Decreased step length - right Gait velocity: decreased   General  Gait Details: cues for safe proximity to RW, posture, and step length symmetry; improved step through pattern with increased distance   Stairs Stairs: Yes Stairs assistance: Min assist Stair Management: No rails;Step to pattern;Backwards;With walker Number of Stairs: (3 steps X 2 trials) General stair comments: cues for sequencing and technique; wife stabilized RW    Wheelchair Mobility    Modified Rankin (Stroke Patients Only)       Balance Overall balance assessment: Needs assistance Sitting-balance support: Feet supported;No upper extremity supported Sitting balance-[name]: Good     Standing balance support: Bilateral upper extremity supported Standing balance-[name]: Poor                              Cognition Arousal/Alertness: Awake/alert Behavior During Therapy: WFL for tasks assessed/performed Overall Cognitive Status: Within Functional Limits for tasks assessed                                        Exercises      General Comments        Pertinent Vitals/Pain Pain Assessment: Faces Faces Pain Scale: Hurts little more Pain Location: L knee Pain Descriptors / Indicators: Discomfort;Guarding Pain Intervention(s): Limited activity within patient's tolerance;Premedicated before session;Monitored during session;Repositioned    Home Living                      Prior Function            PT Goals (current goals can now be found in the care plan section) Progress towards PT goals: Progressing toward goals  Frequency    7X/week      PT Plan Current plan remains appropriate    Co-evaluation              AM-PAC PT "6 Clicks" Mobility   Outcome Measure  Help needed turning from your back to your side while in a flat bed without using bedrails?: None Help needed moving from lying on your back to sitting on the side of a flat bed without using bedrails?: None Help needed moving to and from a bed  to a chair (including a wheelchair)?: A Little Help needed standing up from a chair using your arms (e.g., wheelchair or bedside chair)?: A Little Help needed to walk in hospital room?: A Little Help needed climbing 3-5 steps with a railing? : A Little 6 Click Score: 20    End of Session Equipment Utilized During Treatment: Gait belt Activity Tolerance: Patient tolerated treatment well Patient left: in chair;with call bell/phone within reach;with family/visitor present Nurse Communication: Mobility status PT Visit Diagnosis: Muscle weakness (generalized) (M62.81);Difficulty in walking, not elsewhere classified (R26.2);Pain Pain - Right/Left: Left Pain - part of body: Knee     Time: 0915-0952 PT Time Calculation (min) (ACUTE ONLY): 37 min  Charges:  $Gait Training: 23-37 mins                     Earney Navy, PTA Acute Rehabilitation Services Pager: [phone] Office: [phone]     Darliss Cheney 02/06/2019, 10:03 AM

## 2019-02-06 NOTE — Plan of Care (Signed)
Problem: Education: Goal: Knowledge of General Education information will improve Description Including pain rating scale, medication(s)/side effects and non-pharmacologic comfort measures Outcome: Progressing   Problem: Health Behavior/Discharge Planning: Goal: Ability to manage health-related needs will improve Outcome: Progressing   Problem: Clinical Measurements: Goal: Respiratory complications will improve Outcome: Progressing Goal: Cardiovascular complication will be avoided Outcome: Progressing   Problem: Activity: Goal: Risk for activity intolerance will decrease Outcome: Progressing   Problem: Nutrition: Goal: Adequate nutrition will be maintained Outcome: Progressing   Problem: Elimination: Goal: Will not experience complications related to urinary retention Outcome: Progressing   Problem: Pain Managment: Goal: General experience of comfort will improve Outcome: Progressing   Problem: Safety: Goal: Ability to remain free from injury will improve Outcome: Progressing   Problem: Skin Integrity: Goal: Risk for impaired skin integrity will decrease Outcome: Progressing   

## 2019-02-06 NOTE — Discharge Summary (Signed)
[name], MD   Biagio Borg, PA-C [address] [place], [place]  [address]                             [phone]  PATIENT ID: Christian [name]        MRN:  [id number]          DOB/AGE: [birth year] / 72 y.o.    DISCHARGE SUMMARY  ADMISSION DATE:    02/05/2019 DISCHARGE DATE:   02/06/2019   ADMISSION DIAGNOSIS: LEFT KNEE OSTEOARTHRITIS    DISCHARGE DIAGNOSIS:  LEFT KNEE OSTEOARTHRITIS    ADDITIONAL DIAGNOSIS: Principal Problem:   Primary osteoarthritis of left knee Active Problems:   Total knee replacement status, left  Past Medical History:  Diagnosis Date  . Arthritis    "joints; shoulders; left elbow; right thumb" (05/06/2014)  . Cataracts, bilateral    immature  . Constipation    takes Colace and Metamucil daily  . Coronary artery disease 02/2011   cath 11/2016 showing 20% RCA, 50% OM, 35% mid LCx, 70% D1, 65% mid LAD, 70% distal LAD 80% on medical management  . Diastolic dysfunction   . Heart murmur   . Hemorrhoids   . History of bronchitis   . Hypercholesterolemia    takes Crestor daily  . Hypertension   . Joint pain   . Nocturia   . Orthostatic hypotension   . PVC's (premature ventricular contractions)    PVC load 0.2% by Holter 12/2016  . Sciatica     PROCEDURE: Procedure(s): LEFT TOTAL KNEE ARTHROPLASTY  on 02/05/2019  CONSULTS: none    HISTORY: See H&P in chart  HOSPITAL COURSE:  Christian [name] is a 73 y.o. admitted on 02/05/2019 with a  diagnosis of LEFT KNEE OSTEOARTHRITIS.  After appropriate laboratory studies were obtained  Patient was taken to the operating room on 02/05/2019 and underwent  Procedure(s): LEFT TOTAL KNEE ARTHROPLASTY     perioperative antibiotics:  Anti-infectives (From admission, onward)   Start     Dose/Rate Route Frequency Ordered Stop   02/05/19 1700  ceFAZolin (ANCEF) IVPB 2g/100 mL premix     2 g 200 mL/hr over 30 Minutes Intravenous Every 6 hours 02/05/19 1610 02/06/19 0009   02/05/19 0900  ceFAZolin  (ANCEF) 3 g in dextrose 5 % 50 mL IVPB     3 g 100 mL/hr over 30 Minutes Intravenous To Short Stay 02/04/19 1245 02/05/19 1015    .  Tolerated the procedure well.  Placed with a foley intraoperatively.     Toradol was given post op.  POD #1, allowed out of bed to a chair.  PT for ambulation and exercise program.  Foley D/C'd in morning.  IV saline locked.  O2 discontionued.Dressing changed to Mepiliex and patient discharged to home without complications    .  The remainder of the hospital course was dedicated to ambulation and strengthening.   The patient was discharged on 1 Day Post-Op in  Stable condition.  Blood products given:none  DIAGNOSTIC STUDIES: Recent vital signs:  Patient Vitals for the past 24 hrs:  BP Temp Temp src Pulse Resp SpO2 Height Weight  02/06/19 0442 126/78 98.4 F (36.9 C) Oral (!) 109 17 95 % - -  02/06/19 0034 122/81 99.1 F (37.3 C) Oral 73 17 97 % - -  02/05/19 2058 117/67 98.1 F (36.7 C) Oral 71 17 97 % - -  02/05/19 1629 131/84 98.4 F (36.9  C) Oral 64 18 97 % 5' 11.75" (1.822 m) 123 kg  02/05/19 1600 - - - 60 12 96 % - -  02/05/19 1545 132/80 - - 63 10 97 % - -  02/05/19 1530 - - - 61 17 98 % - -  02/05/19 1515 - - - 61 12 97 % - -  02/05/19 1500 - - - (!) 58 13 99 % - -  02/05/19 1445 - - - (!) 58 15 94 % - -  02/05/19 1429 134/77 - - (!) 59 - 97 % - -  02/05/19 1338 (!) 149/78 - - (!) 55 - 96 % - -  02/05/19 1330 - - - (!) 54 10 95 % - -  02/05/19 1300 - - - 67 15 100 % - -  02/05/19 1248 109/74 - - 63 - 96 % - -  02/05/19 1232 106/77 98 F (36.7 C) - 63 - - - -       Recent laboratory studies: Recent Labs    02/06/19 0205  WBC 6.0  HGB 11.9*  HCT 36.0*  PLT 163   Recent Labs    02/06/19 0205  NA 134*  K 3.6  CL 104  CO2 21*  BUN 17  CREATININE 1.16  GLUCOSE 103*  CALCIUM 8.1*   Lab Results  Component Value Date   INR 1.03 01/29/2019   INR 1.0 12/05/2016   INR 1.08 09/11/2015     Recent Radiographic Studies :  Dg  Chest 2 View  Result Date: 01/29/2019 CLINICAL DATA:  Preoperative evaluation for upcoming knee surgery EXAM: CHEST - 2 VIEW COMPARISON:  10/01/2018 FINDINGS: Cardiac shadow is within normal limits. The lungs are hyperaerated bilaterally. No focal infiltrate or sizable effusion is noted. No acute bony abnormality is seen. Multilevel degenerative changes in the thoracic spine are noted. IMPRESSION: No active cardiopulmonary disease. Electronically Signed   By: Inez Catalina M.D.   On: 01/29/2019 09:24   Xr Knee 3 View Left  Result Date: 01/14/2019 Films of the left knee were obtained in 3 projections standing and compared to films were performed 2 years ago.  There is progressive degenerative change about the patellofemoral joint and the lateral joint space where there is narrowing and peripheral osteophytes.  There is some narrowing of the medial compartment with subchondral sclerosis and cyst change.  All of the changes are progressive since the films performed in 2018   DISCHARGE INSTRUCTIONS: Discharge Instructions    CPM   Complete by:  As directed    Continuous passive motion machine (CPM):      Use the CPM from 6 to 8  hours per day.      You may increase by 5-10 degrees per day.  You may break it up into 2 or 3 sessions per day.      Use CPM for 2 weeks or until you are told to stop.   Call MD / Call 911   Complete by:  As directed    If you experience chest pain or shortness of breath, CALL 911 and be transported to the hospital emergency room.  If you develope a fever above 101 F, pus (white drainage) or increased drainage or redness at the wound, or calf pain, call your surgeon's office.   Change dressing   Complete by:  As directed    Do not change dressing. May shower or bathe with present dressing   Constipation Prevention   Complete by:  As  directed    Drink plenty of fluids.  Prune juice may be helpful.  You may use a stool softener, such as Colace (over the counter) 100 mg  twice a day.  Use MiraLax (over the counter) for constipation as needed.   Diet - low sodium heart healthy   Complete by:  As directed    Discharge instructions   Complete by:  As directed    Office 2 weeks   Do not put a pillow under the knee. Place it under the heel.   Complete by:  As directed    Driving restrictions   Complete by:  As directed    No driving for 4 weeks   Increase activity slowly as tolerated   Complete by:  As directed    Lifting restrictions   Complete by:  As directed    No lifting for 4 weeks   TED hose   Complete by:  As directed    Use stockings (TED hose) for 2 weeks on bothleg(s).  You may remove them at night for sleeping.      DISCHARGE MEDICATIONS:   Allergies as of 02/06/2019      Reactions   Atorvastatin Calcium [atorvastatin] Other (See Comments)   Myalgia   Simvastatin Other (See Comments)   Myalgia   Pravastatin Other (See Comments)   Leg Cramps      Medication List    TAKE these medications   amoxicillin 500 MG tablet Commonly known as:  AMOXIL Take 4 tablets 2 hours prior to any dental procedure What changed:    how much to take  how to take this  when to take this  additional instructions   aspirin 81 MG tablet Take 81 mg by mouth daily.   docusate sodium 100 MG capsule Commonly known as:  COLACE Take 300 mg by mouth daily.   furosemide 20 MG tablet Commonly known as:  LASIX TAKE 1 TABLET BY MOUTH EVERY MONDAY, WEDNESDAY, FRIDAY What changed:    how much to take  how to take this  when to take this   isosorbide mononitrate 60 MG 24 hr tablet Commonly known as:  IMDUR TAKE 1 TABLET(60 MG) BY MOUTH DAILY What changed:  See the new instructions.   lisinopril 40 MG tablet Commonly known as:  PRINIVIL,ZESTRIL Take 1 tablet (40 mg total) by mouth daily.   methocarbamol 500 MG tablet Commonly known as:  ROBAXIN Take 1 tablet (500 mg total) by mouth every 6 (six) hours as needed for muscle spasms.     nitroGLYCERIN 0.4 MG SL tablet Commonly known as:  NITROSTAT PLACE 1 TABLET UNDER THE TONGUE EVERY 5 MINUTES AS NEEDED FOR CHEST PAIN What changed:  See the new instructions.   oxyCODONE 5 MG immediate release tablet Commonly known as:  Oxy IR/ROXICODONE Take 1-2 tablets (5-10 mg total) by mouth every 4 (four) hours as needed for moderate pain (pain score 4-6).   rosuvastatin 20 MG tablet Commonly known as:  CRESTOR TAKE 1 TABLET(20 MG) BY MOUTH DAILY What changed:  See the new instructions.            Durable Medical Equipment  (From admission, onward)         Start     Ordered   02/05/19 1611  DME Walker rolling  Once    Question:  Patient needs a walker to treat with the following condition  Answer:  Total knee replacement status, left   02/05/19 1610   02/05/19  1611  DME 3 n 1  Once     02/05/19 1610   02/05/19 1611  DME Bedside commode  Once    Question:  Patient needs a bedside commode to treat with the following condition  Answer:  Total knee replacement status, left   02/05/19 1610           Discharge Care Instructions  (From admission, onward)         Start     Ordered   02/06/19 0000  Change dressing    Comments:  Do not change dressing. May shower or bathe with present dressing   02/06/19 0820          FOLLOW UP VISIT:    DISPOSITION:   Home  CONDITION:  Stable   [name], MD [facility] [phone]  02/06/2019 8:21 AM

## 2019-02-08 ENCOUNTER — Telehealth (INDEPENDENT_AMBULATORY_CARE_PROVIDER_SITE_OTHER): Payer: Self-pay | Admitting: Orthopaedic Surgery

## 2019-02-08 DIAGNOSIS — Z87891 Personal history of nicotine dependence: Secondary | ICD-10-CM | POA: Diagnosis not present

## 2019-02-08 DIAGNOSIS — Z96653 Presence of artificial knee joint, bilateral: Secondary | ICD-10-CM | POA: Diagnosis not present

## 2019-02-08 DIAGNOSIS — Z96641 Presence of right artificial hip joint: Secondary | ICD-10-CM | POA: Diagnosis not present

## 2019-02-08 DIAGNOSIS — M19012 Primary osteoarthritis, left shoulder: Secondary | ICD-10-CM | POA: Diagnosis not present

## 2019-02-08 DIAGNOSIS — M19011 Primary osteoarthritis, right shoulder: Secondary | ICD-10-CM | POA: Diagnosis not present

## 2019-02-08 DIAGNOSIS — M19022 Primary osteoarthritis, left elbow: Secondary | ICD-10-CM | POA: Diagnosis not present

## 2019-02-08 DIAGNOSIS — Z7982 Long term (current) use of aspirin: Secondary | ICD-10-CM | POA: Diagnosis not present

## 2019-02-08 DIAGNOSIS — Z471 Aftercare following joint replacement surgery: Secondary | ICD-10-CM | POA: Diagnosis not present

## 2019-02-08 DIAGNOSIS — I119 Hypertensive heart disease without heart failure: Secondary | ICD-10-CM | POA: Diagnosis not present

## 2019-02-08 DIAGNOSIS — Z9181 History of falling: Secondary | ICD-10-CM | POA: Diagnosis not present

## 2019-02-08 DIAGNOSIS — I951 Orthostatic hypotension: Secondary | ICD-10-CM | POA: Diagnosis not present

## 2019-02-08 DIAGNOSIS — E785 Hyperlipidemia, unspecified: Secondary | ICD-10-CM | POA: Diagnosis not present

## 2019-02-08 DIAGNOSIS — M5430 Sciatica, unspecified side: Secondary | ICD-10-CM | POA: Diagnosis not present

## 2019-02-08 DIAGNOSIS — I493 Ventricular premature depolarization: Secondary | ICD-10-CM | POA: Diagnosis not present

## 2019-02-08 DIAGNOSIS — I2510 Atherosclerotic heart disease of native coronary artery without angina pectoris: Secondary | ICD-10-CM | POA: Diagnosis not present

## 2019-02-08 NOTE — Telephone Encounter (Signed)
Christian [name] from [facility] called requesting verbal orders for home PT 1 time/week for 1 week, 3 times/week for 1 week, 1 time/week for 1 week.  Please call back at [phone]

## 2019-02-11 NOTE — Telephone Encounter (Signed)
Please advise 

## 2019-02-11 NOTE — Telephone Encounter (Signed)
Maria from [facility] called again to check on verbal orders. Tiffany Kocher doctor okayed request for PT with requested frequency.

## 2019-02-11 NOTE — Telephone Encounter (Signed)
ok 

## 2019-02-11 NOTE — Telephone Encounter (Signed)
I called [facility] - Verdis Frederickson

## 2019-02-12 ENCOUNTER — Telehealth (INDEPENDENT_AMBULATORY_CARE_PROVIDER_SITE_OTHER): Payer: Self-pay | Admitting: Orthopaedic Surgery

## 2019-02-12 ENCOUNTER — Ambulatory Visit (INDEPENDENT_AMBULATORY_CARE_PROVIDER_SITE_OTHER): Payer: Medicare Other | Admitting: Orthopedic Surgery

## 2019-02-12 DIAGNOSIS — M5430 Sciatica, unspecified side: Secondary | ICD-10-CM | POA: Diagnosis not present

## 2019-02-12 DIAGNOSIS — Z471 Aftercare following joint replacement surgery: Secondary | ICD-10-CM | POA: Diagnosis not present

## 2019-02-12 DIAGNOSIS — I2510 Atherosclerotic heart disease of native coronary artery without angina pectoris: Secondary | ICD-10-CM | POA: Diagnosis not present

## 2019-02-12 DIAGNOSIS — I119 Hypertensive heart disease without heart failure: Secondary | ICD-10-CM | POA: Diagnosis not present

## 2019-02-12 DIAGNOSIS — I493 Ventricular premature depolarization: Secondary | ICD-10-CM | POA: Diagnosis not present

## 2019-02-12 DIAGNOSIS — I951 Orthostatic hypotension: Secondary | ICD-10-CM | POA: Diagnosis not present

## 2019-02-12 NOTE — Telephone Encounter (Signed)
Patient called stating he has appointment with Dr. Durward Fortes on 2/25 to have his staples removed.  Patient is requesting 1 anxiety tablet to take prior to appointment.  Patient's pharmacy is [facility] in [place].

## 2019-02-13 DIAGNOSIS — I2510 Atherosclerotic heart disease of native coronary artery without angina pectoris: Secondary | ICD-10-CM | POA: Diagnosis not present

## 2019-02-13 DIAGNOSIS — I119 Hypertensive heart disease without heart failure: Secondary | ICD-10-CM | POA: Diagnosis not present

## 2019-02-13 DIAGNOSIS — I951 Orthostatic hypotension: Secondary | ICD-10-CM | POA: Diagnosis not present

## 2019-02-13 DIAGNOSIS — M5430 Sciatica, unspecified side: Secondary | ICD-10-CM | POA: Diagnosis not present

## 2019-02-13 DIAGNOSIS — Z471 Aftercare following joint replacement surgery: Secondary | ICD-10-CM | POA: Diagnosis not present

## 2019-02-13 DIAGNOSIS — I493 Ventricular premature depolarization: Secondary | ICD-10-CM | POA: Diagnosis not present

## 2019-02-13 NOTE — Telephone Encounter (Signed)
Spoke with patient. He has never had anything for anxiety so therefore he has no preference in what he takes prior to his visit. Thanks!

## 2019-02-13 NOTE — Telephone Encounter (Signed)
Please advise. Thanks.  

## 2019-02-13 NOTE — Telephone Encounter (Signed)
See below

## 2019-02-13 NOTE — Telephone Encounter (Signed)
Ask him what medicine he would like to use

## 2019-02-14 ENCOUNTER — Other Ambulatory Visit (INDEPENDENT_AMBULATORY_CARE_PROVIDER_SITE_OTHER): Payer: Self-pay | Admitting: Orthopedic Surgery

## 2019-02-14 MED ORDER — DIAZEPAM 2 MG PO TABS: 2.0000 mg | ORAL_TABLET | Freq: Once | ORAL | 0 refills | Status: AC

## 2019-02-14 NOTE — Telephone Encounter (Signed)
Called in RX for Valium

## 2019-02-14 NOTE — Telephone Encounter (Signed)
Called and spoke with patient. Told him that his valium has been sent in to pharmacy and to make sure someone drives him.

## 2019-02-15 DIAGNOSIS — I119 Hypertensive heart disease without heart failure: Secondary | ICD-10-CM | POA: Diagnosis not present

## 2019-02-15 DIAGNOSIS — I951 Orthostatic hypotension: Secondary | ICD-10-CM | POA: Diagnosis not present

## 2019-02-15 DIAGNOSIS — M5430 Sciatica, unspecified side: Secondary | ICD-10-CM | POA: Diagnosis not present

## 2019-02-15 DIAGNOSIS — Z471 Aftercare following joint replacement surgery: Secondary | ICD-10-CM | POA: Diagnosis not present

## 2019-02-15 DIAGNOSIS — I493 Ventricular premature depolarization: Secondary | ICD-10-CM | POA: Diagnosis not present

## 2019-02-15 DIAGNOSIS — I2510 Atherosclerotic heart disease of native coronary artery without angina pectoris: Secondary | ICD-10-CM | POA: Diagnosis not present

## 2019-02-18 DIAGNOSIS — I951 Orthostatic hypotension: Secondary | ICD-10-CM | POA: Diagnosis not present

## 2019-02-18 DIAGNOSIS — I493 Ventricular premature depolarization: Secondary | ICD-10-CM | POA: Diagnosis not present

## 2019-02-18 DIAGNOSIS — M19022 Primary osteoarthritis, left elbow: Secondary | ICD-10-CM | POA: Diagnosis not present

## 2019-02-18 DIAGNOSIS — I119 Hypertensive heart disease without heart failure: Secondary | ICD-10-CM | POA: Diagnosis not present

## 2019-02-18 DIAGNOSIS — E785 Hyperlipidemia, unspecified: Secondary | ICD-10-CM | POA: Diagnosis not present

## 2019-02-18 DIAGNOSIS — Z471 Aftercare following joint replacement surgery: Secondary | ICD-10-CM | POA: Diagnosis not present

## 2019-02-18 DIAGNOSIS — I2510 Atherosclerotic heart disease of native coronary artery without angina pectoris: Secondary | ICD-10-CM | POA: Diagnosis not present

## 2019-02-18 DIAGNOSIS — M5430 Sciatica, unspecified side: Secondary | ICD-10-CM | POA: Diagnosis not present

## 2019-02-18 DIAGNOSIS — Z87891 Personal history of nicotine dependence: Secondary | ICD-10-CM | POA: Diagnosis not present

## 2019-02-18 DIAGNOSIS — Z96653 Presence of artificial knee joint, bilateral: Secondary | ICD-10-CM | POA: Diagnosis not present

## 2019-02-18 DIAGNOSIS — Z7982 Long term (current) use of aspirin: Secondary | ICD-10-CM | POA: Diagnosis not present

## 2019-02-18 DIAGNOSIS — M19011 Primary osteoarthritis, right shoulder: Secondary | ICD-10-CM | POA: Diagnosis not present

## 2019-02-19 ENCOUNTER — Encounter (INDEPENDENT_AMBULATORY_CARE_PROVIDER_SITE_OTHER): Payer: Self-pay | Admitting: Orthopaedic Surgery

## 2019-02-19 ENCOUNTER — Ambulatory Visit (INDEPENDENT_AMBULATORY_CARE_PROVIDER_SITE_OTHER): Payer: Medicare Other

## 2019-02-19 ENCOUNTER — Ambulatory Visit (INDEPENDENT_AMBULATORY_CARE_PROVIDER_SITE_OTHER): Payer: Medicare Other | Admitting: Orthopaedic Surgery

## 2019-02-19 DIAGNOSIS — G8929 Other chronic pain: Secondary | ICD-10-CM | POA: Diagnosis not present

## 2019-02-19 DIAGNOSIS — M25562 Pain in left knee: Secondary | ICD-10-CM | POA: Diagnosis not present

## 2019-02-19 DIAGNOSIS — Z96652 Presence of left artificial knee joint: Secondary | ICD-10-CM

## 2019-02-19 MED ORDER — OXYCODONE HCL 5 MG PO TABS: 5.0000 mg | ORAL_TABLET | ORAL | 0 refills | Status: DC | PRN

## 2019-02-19 MED ORDER — METHOCARBAMOL 500 MG PO TABS: 500.0000 mg | ORAL_TABLET | Freq: Three times a day (TID) | ORAL | 0 refills | Status: DC | PRN

## 2019-02-19 NOTE — Progress Notes (Signed)
Office Visit Note   Patient: Christian [name]           Date of Birth: Sep 28, 1946           MRN: [id number] Visit Date: 02/19/2019              Requested by: Alroy Dust, L.Marlou Sa, [place] [employer] [address] [place], [place] [address] PCP: Alroy Dust, L.Marlou Sa, MD   Assessment & Plan: Visit Diagnoses:  1. S/P TKR (total knee replacement) using cement, left   2. Chronic pain of left knee     Plan: #1: Staples removed and Steri-Strips are placed #2:  Plan for physical therapy at [facility] PT as an outpatient #3: Follow back up 2 weeks  Follow-Up Instructions: Return in about 2 weeks (around 03/05/2019).   Orders:  Orders Placed This Encounter  Procedures  . XR KNEE 3 VIEW LEFT  . Ambulatory referral to Physical Therapy   Meds ordered this encounter  Medications  . oxyCODONE (OXY IR/ROXICODONE) 5 MG immediate release tablet    Sig: Take 1 tablet (5 mg total) by mouth every 4 (four) hours as needed for severe pain.    Dispense:  30 tablet    Refill:  0    Order Specific Question:   Supervising Provider    Answer:   Garald Balding [[id number]]  . methocarbamol (ROBAXIN) 500 MG tablet    Sig: Take 1 tablet (500 mg total) by mouth every 8 (eight) hours as needed for muscle spasms.    Dispense:  30 tablet    Refill:  0    Order Specific Question:   Supervising Provider    Answer:   Garald Balding [[id number]]  . oxyCODONE (OXY IR/ROXICODONE) 5 MG immediate release tablet    Sig: Take 1 tablet (5 mg total) by mouth every 4 (four) hours as needed for severe pain.    Dispense:  40 tablet    Refill:  0    Order Specific Question:   Supervising Provider    Answer:   Garald Balding [[id number]]      Procedures: No procedures performed   Clinical Data: No additional findings.   Subjective: No chief complaint on file.   HPI  Christian [name] returns today's 2-week status post total knee arthroplasty on the left.  He has been doing well.  Physical therapy has been completed at home.  Like to  be transferred to [facility] physical therapy outpatient therapy.  Denies any calf pain.  States he is functioning to 95 degrees of flexion.  Denies any neurovascular compromise by history.  Review of Systems  Constitutional: Negative.   HENT: Negative.   Eyes: Negative.   Respiratory: Negative.   Cardiovascular: Negative.   Gastrointestinal: Negative.   Endocrine: Negative.   Genitourinary: Negative.   Musculoskeletal: Positive for gait problem.  Skin: Negative.   Allergic/Immunologic: Negative.   Hematological: Negative.   Psychiatric/Behavioral: Negative.      Objective: Vital Signs: There were no vitals taken for this visit.  Physical Exam  Ortho Exam  Wound today is healing per primam with no signs of infection.  Staples removed and Steri-Strips were placed.  Does have a mild effusion.  No calf pain.  He ranges from near full extension to 90 to 95 degrees of flexion.  Specialty Comments:  No specialty comments available.  Imaging: No results found.   PMFS History: Patient Active Problem List   Diagnosis Date Noted  . Primary osteoarthritis of left knee  02/05/2019  . Total knee replacement status, left 02/05/2019  . PVC's (premature ventricular contractions)   . Dyspnea 12/06/2016  . Osteoarthritis of right hip 09/22/2015  . S/P total hip arthroplasty 09/22/2015  . Obesity 05/08/2014  . Osteoarthritis of knee 05/08/2014  . S/P total knee replacement using cement 05/06/2014  . Knee pain 04/08/2014  . Preoperative clearance 04/08/2014  . Coronary artery disease   . Diastolic dysfunction   . Hypertension   . Orthostatic hypotension   . Hypercholesterolemia    Past Medical History:  Diagnosis Date  . Arthritis    "joints; shoulders; left elbow; right thumb" (05/06/2014)  . Cataracts, bilateral    immature  . Constipation    takes Colace and Metamucil daily  . Coronary artery disease 02/2011   cath 11/2016 showing 20% RCA, 50% OM, 35% mid LCx, 70% D1, 65%  mid LAD, 70% distal LAD 80% on medical management  . Diastolic dysfunction   . Heart murmur   . Hemorrhoids   . History of bronchitis   . Hypercholesterolemia    takes Crestor daily  . Hypertension   . Joint pain   . Nocturia   . Orthostatic hypotension   . PVC's (premature ventricular contractions)    PVC load 0.2% by Holter 12/2016  . Sciatica     Family History  Problem Relation Age of Onset  . CVA Mother   . CVA Father   . Heart disease Father     Past Surgical History:  Procedure Laterality Date  . ACHILLES TENDON REPAIR Left    "& fixed a spur"  . CARDIAC CATHETERIZATION  2012  . CARDIAC CATHETERIZATION N/A 12/06/2016   Procedure: Left Heart Cath and Coronary Angiography;  Surgeon: Belva Crome, MD;  Location: [facility];  Service: Cardiovascular;  Laterality: N/A;  . COLONOSCOPY    . FLEXIBLE SIGMOIDOSCOPY  X 2  . HAND SURGERY Left    "thumb; cleaned out arthritis"  . HEEL SPUR EXCISION Left   . KNEE ARTHROSCOPY Right   . SHOULDER SURGERY Right X 2   "cleaned out spurs"  . TOE FUSION Right    great toe; plates and screws  . TONSILLECTOMY  ~ 1950  . TOTAL HIP ARTHROPLASTY Right 09/22/2015   Procedure: TOTAL HIP ARTHROPLASTY;  Surgeon: Garald Balding, MD;  Location: [facility];  Service: Orthopedics;  Laterality: Right;  . TOTAL KNEE ARTHROPLASTY Right 05/06/2014  . TOTAL KNEE ARTHROPLASTY Right 05/06/2014   Procedure: RIGHT TOTAL KNEE ARTHROPLASTY;  Surgeon: Garald Balding, MD;  Location: [facility];  Service: Orthopedics;  Laterality: Right;  . TOTAL KNEE ARTHROPLASTY Left 02/05/2019   Procedure: LEFT TOTAL KNEE ARTHROPLASTY;  Surgeon: Garald Balding, MD;  Location: [facility];  Service: Orthopedics;  Laterality: Left;   Social History   Occupational History  . Not on file  Tobacco Use  . Smoking status: Former Smoker    Packs/day: 2.00    Years: 10.00    Pack years: 20.00    Types: Cigarettes    Last attempt to quit: 1981    Years since quitting: 39.1    . Smokeless tobacco: Never Used  . Tobacco comment: quit smoking in 1981  Substance and Sexual Activity  . Alcohol use: Yes    Comment: "drink a beer sometimes once/month"  . Drug use: No  . Sexual activity: Never

## 2019-02-22 DIAGNOSIS — M1712 Unilateral primary osteoarthritis, left knee: Secondary | ICD-10-CM | POA: Diagnosis not present

## 2019-02-25 DIAGNOSIS — M1712 Unilateral primary osteoarthritis, left knee: Secondary | ICD-10-CM | POA: Diagnosis not present

## 2019-02-27 DIAGNOSIS — M1712 Unilateral primary osteoarthritis, left knee: Secondary | ICD-10-CM | POA: Diagnosis not present

## 2019-03-01 DIAGNOSIS — M1712 Unilateral primary osteoarthritis, left knee: Secondary | ICD-10-CM | POA: Diagnosis not present

## 2019-03-04 DIAGNOSIS — M1712 Unilateral primary osteoarthritis, left knee: Secondary | ICD-10-CM | POA: Diagnosis not present

## 2019-03-05 ENCOUNTER — Encounter (INDEPENDENT_AMBULATORY_CARE_PROVIDER_SITE_OTHER): Payer: Self-pay | Admitting: Orthopedic Surgery

## 2019-03-05 ENCOUNTER — Ambulatory Visit (INDEPENDENT_AMBULATORY_CARE_PROVIDER_SITE_OTHER): Payer: Medicare Other | Admitting: Orthopedic Surgery

## 2019-03-05 VITALS — BP 128/89 | HR 73 | Ht 71.0 in | Wt 271.0 lb

## 2019-03-05 DIAGNOSIS — Z96652 Presence of left artificial knee joint: Secondary | ICD-10-CM

## 2019-03-05 NOTE — Progress Notes (Signed)
Office Visit Note   Patient: Christian [name]           Date of Birth: 1946/11/20           MRN: [id number] Visit Date: 03/05/2019              Requested by: Alroy Dust, L.Marlou Sa, [place] [address] [place], [place] [address] PCP: Alroy Dust, L.Marlou Sa, MD   Assessment & Plan: Visit Diagnoses:  1. S/P TKR (total knee replacement) using cement, left     Plan:  #1: Continue physical therapy as per protocol #2: Follow back up in 3 weeks for recheck evaluation.  Follow-Up Instructions: Return in about 3 weeks (around 03/26/2019).   Orders:  No orders of the defined types were placed in this encounter.  No orders of the defined types were placed in this encounter.     Procedures: No procedures performed   Clinical Data: No additional findings.   Subjective: Chief Complaint  Patient presents with  . Left Knee - Routine Post Op    Left TKA DOS 02/05/19  Patient presents for a two week follow up on his left knee. He had a left total knee arthroplasty on 02/05/19. He is doing well. He is going to threrapy twice weekly. He takes Ibuprofen as needed, and oxycodone rarely.   HPI  Christian [name] is now 4 weeks status post left total knee arthroplasty.  He continues to do very well.  He is doing his physical therapy twice a week.  Very rarely using any narcotics.  Using ibuprofen as needed.  Denies any calf pain.  Only using a cane at this time.  Review of Systems  Constitutional: Negative.   HENT: Negative.   Eyes: Negative.   Respiratory: Negative.   Cardiovascular: Negative.   Gastrointestinal: Negative.   Endocrine: Negative.   Genitourinary: Negative.   Musculoskeletal: Positive for gait problem.  Skin: Negative.   Allergic/Immunologic: Negative.   Hematological: Negative.   Psychiatric/Behavioral: Negative.      Objective: Vital Signs: BP 128/89   Pulse 73   Ht 5\' 11"  (1.803 m)   Wt 271 lb (122.9 kg)   BMI 37.80 kg/m   Physical Exam Constitutional:    Appearance: He is well-developed.  Eyes:     Pupils: Pupils are equal, round, and reactive to light.  Pulmonary:     Effort: Pulmonary effort is normal.  Skin:    General: Skin is warm and dry.  Neurological:     Mental Status: He is alert and oriented to person, place, and time.  Psychiatric:        Behavior: Behavior normal.     Ortho Exam  Wound is healing per primam with no signs of infection.  Steri-Strips removed.  He does have mild effusion no warmth erythema.  Range of motion from about 3 degrees to 105 degrees.  Good ligament stability.  Neurovascular intact distally.  Calf is supple nontender.  Specialty Comments:  No specialty comments available.  Imaging: No results found.   PMFS History: Current Outpatient Medications  Medication Sig Dispense Refill  . amoxicillin (AMOXIL) 500 MG tablet Take 4 tablets 2 hours prior to any dental procedure (Patient taking differently: Take 2,000 mg by mouth See admin instructions. Take 2000 mg by mouth 2 hours prior to any dental procedure) 21 tablet 0  . aspirin 81 MG tablet Take 81 mg by mouth daily.    [name] Kitchen docusate sodium (COLACE) 100 MG capsule Take 300 mg by mouth  daily.    . furosemide (LASIX) 20 MG tablet TAKE 1 TABLET BY MOUTH EVERY MONDAY, WEDNESDAY, FRIDAY (Patient taking differently: Take 20 mg by mouth every Monday, Wednesday, and Friday. TAKE 1 TABLET BY MOUTH EVERY MONDAY, WEDNESDAY, FRIDAY) 42 tablet 3  . isosorbide mononitrate (IMDUR) 60 MG 24 hr tablet TAKE 1 TABLET(60 MG) BY MOUTH DAILY (Patient taking differently: Take 60 mg by mouth daily. ) 90 tablet 2  . lisinopril (PRINIVIL,ZESTRIL) 40 MG tablet Take 1 tablet (40 mg total) by mouth daily. 90 tablet 3  . methocarbamol (ROBAXIN) 500 MG tablet Take 1 tablet (500 mg total) by mouth every 8 (eight) hours as needed for muscle spasms. 30 tablet 0  . nitroGLYCERIN (NITROSTAT) 0.4 MG SL tablet PLACE 1 TABLET UNDER THE TONGUE EVERY 5 MINUTES AS NEEDED FOR CHEST PAIN (Patient  taking differently: Place 0.4 mg under the tongue every 5 (five) minutes as needed for chest pain. ) 75 tablet 3  . oxyCODONE (OXY IR/ROXICODONE) 5 MG immediate release tablet Take 1-2 tablets (5-10 mg total) by mouth every 4 (four) hours as needed for moderate pain (pain score 4-6). 60 tablet 0  . oxyCODONE (OXY IR/ROXICODONE) 5 MG immediate release tablet Take 1 tablet (5 mg total) by mouth every 4 (four) hours as needed for severe pain. 30 tablet 0  . oxyCODONE (OXY IR/ROXICODONE) 5 MG immediate release tablet Take 1 tablet (5 mg total) by mouth every 4 (four) hours as needed for severe pain. 40 tablet 0  . rosuvastatin (CRESTOR) 20 MG tablet TAKE 1 TABLET(20 MG) BY MOUTH DAILY (Patient taking differently: Take 20 mg by mouth daily. ) 90 tablet 2   No current facility-administered medications for this visit.     Patient Active Problem List   Diagnosis Date Noted  . Primary osteoarthritis of left knee 02/05/2019  . Total knee replacement status, left 02/05/2019  . PVC's (premature ventricular contractions)   . Dyspnea 12/06/2016  . Osteoarthritis of right hip 09/22/2015  . S/P total hip arthroplasty 09/22/2015  . Obesity 05/08/2014  . Osteoarthritis of knee 05/08/2014  . S/P total knee replacement using cement 05/06/2014  . Knee pain 04/08/2014  . Preoperative clearance 04/08/2014  . Coronary artery disease   . Diastolic dysfunction   . Hypertension   . Orthostatic hypotension   . Hypercholesterolemia    Past Medical History:  Diagnosis Date  . Arthritis    "joints; shoulders; left elbow; right thumb" (05/06/2014)  . Cataracts, bilateral    immature  . Constipation    takes Colace and Metamucil daily  . Coronary artery disease 02/2011   cath 11/2016 showing 20% RCA, 50% OM, 35% mid LCx, 70% D1, 65% mid LAD, 70% distal LAD 80% on medical management  . Diastolic dysfunction   . Heart murmur   . Hemorrhoids   . History of bronchitis   . Hypercholesterolemia    takes Crestor  daily  . Hypertension   . Joint pain   . Nocturia   . Orthostatic hypotension   . PVC's (premature ventricular contractions)    PVC load 0.2% by Holter 12/2016  . Sciatica     Family History  Problem Relation Age of Onset  . CVA Mother   . CVA Father   . Heart disease Father     Past Surgical History:  Procedure Laterality Date  . ACHILLES TENDON REPAIR Left    "& fixed a spur"  . CARDIAC CATHETERIZATION  2012  . CARDIAC CATHETERIZATION N/A 12/06/2016  Procedure: Left Heart Cath and Coronary Angiography;  Surgeon: Belva Crome, MD;  Location: [facility];  Service: Cardiovascular;  Laterality: N/A;  . COLONOSCOPY    . FLEXIBLE SIGMOIDOSCOPY  X 2  . HAND SURGERY Left    "thumb; cleaned out arthritis"  . HEEL SPUR EXCISION Left   . KNEE ARTHROSCOPY Right   . SHOULDER SURGERY Right X 2   "cleaned out spurs"  . TOE FUSION Right    great toe; plates and screws  . TONSILLECTOMY  ~ 1950  . TOTAL HIP ARTHROPLASTY Right 09/22/2015   Procedure: TOTAL HIP ARTHROPLASTY;  Surgeon: Garald Balding, MD;  Location: [facility];  Service: Orthopedics;  Laterality: Right;  . TOTAL KNEE ARTHROPLASTY Right 05/06/2014  . TOTAL KNEE ARTHROPLASTY Right 05/06/2014   Procedure: RIGHT TOTAL KNEE ARTHROPLASTY;  Surgeon: Garald Balding, MD;  Location: [facility];  Service: Orthopedics;  Laterality: Right;  . TOTAL KNEE ARTHROPLASTY Left 02/05/2019   Procedure: LEFT TOTAL KNEE ARTHROPLASTY;  Surgeon: Garald Balding, MD;  Location: [facility];  Service: Orthopedics;  Laterality: Left;   Social History   Occupational History  . Not on file  Tobacco Use  . Smoking status: Former Smoker    Packs/day: 2.00    Years: 10.00    Pack years: 20.00    Types: Cigarettes    Last attempt to quit: 1981    Years since quitting: 39.2  . Smokeless tobacco: Never Used  . Tobacco comment: quit smoking in 1981  Substance and Sexual Activity  . Alcohol use: Yes    Comment: "drink a beer sometimes once/month"  .  Drug use: No  . Sexual activity: Never

## 2019-03-07 DIAGNOSIS — M1712 Unilateral primary osteoarthritis, left knee: Secondary | ICD-10-CM | POA: Diagnosis not present

## 2019-03-11 DIAGNOSIS — M1712 Unilateral primary osteoarthritis, left knee: Secondary | ICD-10-CM | POA: Diagnosis not present

## 2019-03-14 DIAGNOSIS — M1712 Unilateral primary osteoarthritis, left knee: Secondary | ICD-10-CM | POA: Diagnosis not present

## 2019-03-18 DIAGNOSIS — M1712 Unilateral primary osteoarthritis, left knee: Secondary | ICD-10-CM | POA: Diagnosis not present

## 2019-03-21 DIAGNOSIS — M1712 Unilateral primary osteoarthritis, left knee: Secondary | ICD-10-CM | POA: Diagnosis not present

## 2019-03-22 ENCOUNTER — Ambulatory Visit (INDEPENDENT_AMBULATORY_CARE_PROVIDER_SITE_OTHER): Payer: Medicare Other | Admitting: Orthopaedic Surgery

## 2019-03-22 ENCOUNTER — Other Ambulatory Visit: Payer: Self-pay

## 2019-03-22 ENCOUNTER — Encounter (INDEPENDENT_AMBULATORY_CARE_PROVIDER_SITE_OTHER): Payer: Self-pay | Admitting: Orthopaedic Surgery

## 2019-03-22 VITALS — BP 120/87 | HR 69 | Ht 71.0 in | Wt 271.0 lb

## 2019-03-22 DIAGNOSIS — Z96652 Presence of left artificial knee joint: Secondary | ICD-10-CM

## 2019-03-22 NOTE — Progress Notes (Signed)
Office Visit Note   Patient: Christian [name]           Date of Birth: 01/01/1946           MRN: [id number] Visit Date: 03/22/2019              Requested by: Alroy Dust, L.Marlou Sa, [place] [address] [place],  [address] PCP: Alroy Dust, L.Marlou Sa, MD   Assessment & Plan: Visit Diagnoses:  1. History of left knee replacement     Plan: 6-week status post primary left total knee replacement and doing quite well.  Uses a cane on occasion.  Continues to go to physical therapy.  Has achieved almost 220 degrees of flexion with full extension.  No complications.  We will plan to see him back in 6 months and urge him to continue with his exercises  Follow-Up Instructions: Return in about 6 months (around 09/22/2019).   Orders:  No orders of the defined types were placed in this encounter.  No orders of the defined types were placed in this encounter.     Procedures: No procedures performed   Clinical Data: No additional findings.   Subjective: Chief Complaint  Patient presents with  . Left Knee - Routine Post Op    Left TKA 02/05/19  Patient presents today for left knee follow up. He is now 6 weeks out from a left total knee arthroplasty. He is going to therapy twice weekly. Patient states that he is doing well. He has to occasionally take oxycodone after therapy, but usually just takes Ibuprofen.   HPI  Review of Systems   Objective: Vital Signs: BP 120/87   Pulse 69   Ht 5\' 11"  (1.803 m)   Wt 271 lb (122.9 kg)   BMI 37.80 kg/m   Physical Exam  Ortho Exam left knee incision healing without any problems.  No effusion.  Knee was not hot warm or red.  Full extension I measured a little over 105 degrees of flexion without instability.  No calf pain.  No distal edema.  Neurologically intact.  Still weak weak quads and hamstrings  Specialty Comments:  No specialty comments available.  Imaging: No results found.   PMFS History: Patient Active Problem List    Diagnosis Date Noted  . Primary osteoarthritis of left knee 02/05/2019  . History of left knee replacement 02/05/2019  . PVC's (premature ventricular contractions)   . Dyspnea 12/06/2016  . Osteoarthritis of right hip 09/22/2015  . S/P total hip arthroplasty 09/22/2015  . Obesity 05/08/2014  . Osteoarthritis of knee 05/08/2014  . S/P total knee replacement using cement 05/06/2014  . Knee pain 04/08/2014  . Preoperative clearance 04/08/2014  . Coronary artery disease   . Diastolic dysfunction   . Hypertension   . Orthostatic hypotension   . Hypercholesterolemia    Past Medical History:  Diagnosis Date  . Arthritis    "joints; shoulders; left elbow; right thumb" (05/06/2014)  . Cataracts, bilateral    immature  . Constipation    takes Colace and Metamucil daily  . Coronary artery disease 02/2011   cath 11/2016 showing 20% RCA, 50% OM, 35% mid LCx, 70% D1, 65% mid LAD, 70% distal LAD 80% on medical management  . Diastolic dysfunction   . Heart murmur   . Hemorrhoids   . History of bronchitis   . Hypercholesterolemia    takes Crestor daily  . Hypertension   . Joint pain   . Nocturia   . Orthostatic  hypotension   . PVC's (premature ventricular contractions)    PVC load 0.2% by Holter 12/2016  . Sciatica     Family History  Problem Relation Age of Onset  . CVA Mother   . CVA Father   . Heart disease Father     Past Surgical History:  Procedure Laterality Date  . ACHILLES TENDON REPAIR Left    "& fixed a spur"  . CARDIAC CATHETERIZATION  2012  . CARDIAC CATHETERIZATION N/A 12/06/2016   Procedure: Left Heart Cath and Coronary Angiography;  Surgeon: Belva Crome, MD;  Location: [facility];  Service: Cardiovascular;  Laterality: N/A;  . COLONOSCOPY    . FLEXIBLE SIGMOIDOSCOPY  X 2  . HAND SURGERY Left    "thumb; cleaned out arthritis"  . HEEL SPUR EXCISION Left   . KNEE ARTHROSCOPY Right   . SHOULDER SURGERY Right X 2   "cleaned out spurs"  . TOE FUSION  Right    great toe; plates and screws  . TONSILLECTOMY  ~ 1950  . TOTAL HIP ARTHROPLASTY Right 09/22/2015   Procedure: TOTAL HIP ARTHROPLASTY;  Surgeon: Garald Balding, MD;  Location: [facility];  Service: Orthopedics;  Laterality: Right;  . TOTAL KNEE ARTHROPLASTY Right 05/06/2014  . TOTAL KNEE ARTHROPLASTY Right 05/06/2014   Procedure: RIGHT TOTAL KNEE ARTHROPLASTY;  Surgeon: Garald Balding, MD;  Location: [facility];  Service: Orthopedics;  Laterality: Right;  . TOTAL KNEE ARTHROPLASTY Left 02/05/2019   Procedure: LEFT TOTAL KNEE ARTHROPLASTY;  Surgeon: Garald Balding, MD;  Location: [facility];  Service: Orthopedics;  Laterality: Left;   Social History   Occupational History  . Not on file  Tobacco Use  . Smoking status: Former Smoker    Packs/day: 2.00    Years: 10.00    Pack years: 20.00    Types: Cigarettes    Last attempt to quit: 1981    Years since quitting: 39.2  . Smokeless tobacco: Never Used  . Tobacco comment: quit smoking in 1981  Substance and Sexual Activity  . Alcohol use: Yes    Comment: "drink a beer sometimes once/month"  . Drug use: No  . Sexual activity: Never

## 2019-03-25 DIAGNOSIS — M1712 Unilateral primary osteoarthritis, left knee: Secondary | ICD-10-CM | POA: Diagnosis not present

## 2019-03-26 ENCOUNTER — Ambulatory Visit (INDEPENDENT_AMBULATORY_CARE_PROVIDER_SITE_OTHER): Payer: Medicare Other | Admitting: Orthopaedic Surgery

## 2019-03-28 DIAGNOSIS — M1712 Unilateral primary osteoarthritis, left knee: Secondary | ICD-10-CM | POA: Diagnosis not present

## 2019-04-01 DIAGNOSIS — M1712 Unilateral primary osteoarthritis, left knee: Secondary | ICD-10-CM | POA: Diagnosis not present

## 2019-04-08 DIAGNOSIS — M1712 Unilateral primary osteoarthritis, left knee: Secondary | ICD-10-CM | POA: Diagnosis not present

## 2019-04-15 ENCOUNTER — Telehealth: Payer: Self-pay

## 2019-04-15 NOTE — Telephone Encounter (Signed)
Pt agrees to have vital signs, weight and current medications available prior to his appt time. He does have a smart phone and understands that he will receive a text message and he is to follow the prompts and agree to the terms for his appt.   YOUR CARDIOLOGY TEAM HAS ARRANGED FOR AN E-VISIT FOR YOUR APPOINTMENT - PLEASE REVIEW IMPORTANT INFORMATION BELOW SEVERAL DAYS PRIOR TO YOUR APPOINTMENT  Due to the recent COVID-19 pandemic, we are transitioning in-person office visits to tele-medicine visits in an effort to decrease unnecessary exposure to our patients, their families, and staff. These visits are billed to your insurance just like a normal visit is. We also encourage you to sign up for MyChart if you have not already done so. You will need a smartphone if possible. For patients that do not have this, we can still complete the visit using a regular telephone but do prefer a smartphone to enable video when possible. You may have a family member that lives with you that can help. If possible, we also ask that you have a blood pressure cuff and scale at home to measure your blood pressure, heart rate and weight prior to your scheduled appointment. Patients with clinical needs that need an in-person evaluation and testing will still be able to come to the office if absolutely necessary. If you have any questions, feel free to call our office.     YOUR PROVIDER WILL BE USING THE FOLLOWING PLATFORM TO COMPLETE YOUR VISIT: YES  . IF USING WEBEX - How to Download the WebEx App to Your SmartPhone  - If Apple device, go to [employer] and type in WebEx in the search bar. [place] [employer], the blue/green circle. If Android, go to [employer] and type in [employer] in the search bar. The app is free but as with any other app download, your phone may require you to verify saved payment information or Apple/Android password.  - You do NOT have to create an account. - On the day of the visit, our  staff will walk you through joining the meeting with the meeting number/password.  Deloris Ping USING MYCHART - How to Download the MyChart App to Your SmartPhone   - If Apple, go to [employer] and type in MyChart in the search bar and download the app. If Android, ask patient to go to [employer] and type in [employer] in the search bar and download the app. The app is free but as with any other app downloads, your phone may require you to verify saved payment information or Apple/Android password.  - You will need to then log into the app with your MyChart username and password, and select [facility] as your healthcare provider to link the account. When it is time for your visit, go to the MyChart app, find appointments, and click Begin Video Visit. Be sure to Select Allow for your device to access the Microphone and Camera for your visit. You will then be connected, and your provider will be with you shortly.  **If you have any issues connecting or need assistance, please contact MyChart service desk [phone])**  **If using a computer, in order to ensure the best quality for your visit, you will need to use either of the following Internet Browsers: [employer], or [employer]**  . IF USING DOXIMITY or DOXY.ME - The staff will give you instructions on receiving your link to join the meeting the day of  your visit.      2-3 DAYS BEFORE YOUR APPOINTMENT  You will receive a telephone call from one of our [facility] team members - your caller ID may say "Unknown caller." If this is a video visit, we will walk you through how to set up your device to be able to complete the visit. We will remind you check your blood pressure, heart rate and weight prior to your scheduled appointment. If you have an Apple Watch or Kardia, please upload any pertinent ECG strips the day before or morning of your appointment to [facility]. Our staff will also make sure you have reviewed the consent and agree  to move forward with your scheduled tele-health visit.     THE DAY OF YOUR APPOINTMENT  Approximately 15 minutes prior to your scheduled appointment, you will receive a telephone call from one of [facility] team - your caller ID may say "Unknown caller."  Our staff will confirm medications, vital signs for the day and any symptoms you may be experiencing. Please have this information available prior to the time of visit start. It may also be helpful for you to have a pad of paper and pen handy for any instructions given during your visit. They will also walk you through joining the smartphone meeting if this is a video visit.    CONSENT FOR TELE-HEALTH VISIT - PLEASE REVIEW  I hereby voluntarily request, consent and authorize [facility] and its employed or contracted physicians, physician assistants, nurse practitioners or other licensed health care professionals (the Practitioner), to provide me with telemedicine health care services (the "Services") as deemed necessary by the treating Practitioner. I acknowledge and consent to receive the Services by the Practitioner via telemedicine. I understand that the telemedicine visit will involve communicating with the Practitioner through live audiovisual communication technology and the disclosure of certain medical information by electronic transmission. I acknowledge that I have been given the opportunity to request an in-person assessment or other available alternative prior to the telemedicine visit and am voluntarily participating in the telemedicine visit.  I understand that I have the right to withhold or withdraw my consent to the use of telemedicine in the course of my care at any time, without affecting my right to future care or treatment, and that the Practitioner or I may terminate the telemedicine visit at any time. I understand that I have the right to inspect all information obtained and/or recorded in the course of the telemedicine visit  and may receive copies of available information for a reasonable fee.  I understand that some of the potential risks of receiving the Services via telemedicine include:  [name] Kitchen Delay or interruption in medical evaluation due to technological equipment failure or disruption; . Information transmitted may not be sufficient (e.g. poor resolution of images) to allow for appropriate medical decision making by the Practitioner; and/or  . In rare instances, security protocols could fail, causing a breach of personal health information.  Furthermore, I acknowledge that it is my responsibility to provide information about my medical history, conditions and care that is complete and accurate to the best of my ability. I acknowledge that Practitioner's advice, recommendations, and/or decision may be based on factors not within their control, such as incomplete or inaccurate data provided by me or distortions of diagnostic images or specimens that may result from electronic transmissions. I understand that the practice of medicine is not an exact science and that Practitioner makes no warranties or guarantees regarding treatment outcomes. I  acknowledge that I will receive a copy of this consent concurrently upon execution via email to the email address I last provided but may also request a printed copy by calling the office of [facility].    I understand that my insurance will be billed for this visit.   I have read or had this consent read to me. . I understand the contents of this consent, which adequately explains the benefits and risks of the Services being provided via telemedicine.  . I have been provided ample opportunity to ask questions regarding this consent and the Services and have had my questions answered to my satisfaction. . I give my informed consent for the services to be provided through the use of telemedicine in my medical care  By participating in this telemedicine visit I agree to the  above.

## 2019-04-15 NOTE — Progress Notes (Signed)
Virtual Visit via Video Note   This visit type was conducted due to national recommendations for restrictions regarding the COVID-19 Pandemic (e.g. social distancing) in an effort to limit this patient's exposure and mitigate transmission in our community.  Due to his co-morbid illnesses, this patient is at least at moderate risk for complications without adequate follow up.  This format is felt to be most appropriate for this patient at this time.  All issues noted in this document were discussed and addressed.  A limited physical exam was performed with this format.  Please refer to the patient's chart for his consent to telehealth for [facility].  Evaluation Performed:  Follow-up visit  This visit type was conducted due to national recommendations for restrictions regarding the COVID-19 Pandemic (e.g. social distancing).  This format is felt to be most appropriate for this patient at this time.  All issues noted in this document were discussed and addressed.  No physical exam was performed (except for noted visual exam findings with Video Visits).  Please refer to the patient's chart (MyChart message for video visits and phone note for telephone visits) for the patient's consent to telehealth for [facility].  Date:  04/16/2019   ID:  Christian [name], DOB 10-Jan-1946, MRN [id number]  Patient Location:  Home  Provider location:   [facility]  PCP:  Alroy Dust, Carlean Jews.Marlou Sa, MD  Cardiologist:  Fransico Him, MD  Electrophysiologist:  None   Chief Complaint:  CAD, HTN, hyperlipidemia  History of Present Illness:    Christian [name] is a 73 y.o. male who presents via audio/video conferencing for a telehealth visit today.   Christian [name] is a 73 y.o. male with a hx of ASCAD with cath 12/2017showing 20% RCA, 50% OM, 35% mid LCx, 70% D1, 65% mid LAD, 70% distal LAD who remains on medical management. He also was noted to have an elevated LVEDP at 20withEF 45-50% by cath and 50-55% by  echo. He hasa history ofHTN and dyslipidemiaas well.   When I saw him last fall he was having problems with CP and underwent nuclear stress testing showing no ischemia.  He had been under a lot of stress. 2D echo showed normal LVF with EF 60-65% with G1DD and mild AI.  He is here today for followup and is doing well.  He had a knee replacement 11 weeks ago and has just completed rehab.  He has noticed over the past couple weeks that his blood pressures been going up especially the diastolic.  On Sunday he had a headache and took his blood pressure and his blood pressure was 177/109 mmHg.  He increased his lisinopril to 40 mg in the morning and 20 mg at night but blood pressure remained elevated.  He denies any increased sodium intake.  His daughter and son-in-law live with him and they have been getting groceries.  They have not been eating out any and have not been eating any frozen dinners.  He denies any chest pain or pressure, SOB, DOE, PND, orthopnea, palpitations or syncope.  He has not really had any problems with orthostatic hypotension recently.  He says that once in a great while when he is outside working in the yard and is bending up and down a lot but he might get a little woozy.  He does not have any pedal edema but still has occasional left knee swelling where he had his knee replacement.  He is compliant with his meds and is tolerating meds  with no SE.    The patient does not have symptoms concerning for COVID-19 infection (fever, chills, cough, or new shortness of breath).    Prior CV studies:   The following studies were reviewed today:  2D echo and nuclear stress test 09/2018  Past Medical History:  Diagnosis Date   Arthritis    "joints; shoulders; left elbow; right thumb" (05/06/2014)   Cataracts, bilateral    immature   Constipation    takes Colace and Metamucil daily   Coronary artery disease 02/2011   cath 11/2016 showing 20% RCA, 50% OM, 35% mid LCx, 70% D1, 65%  mid LAD, 70% distal LAD 80% on medical management   Diastolic dysfunction    Heart murmur    Hemorrhoids    History of bronchitis    Hypercholesterolemia    takes Crestor daily   Hypertension    Joint pain    Nocturia    Orthostatic hypotension    PVC's (premature ventricular contractions)    PVC load 0.2% by Holter 12/2016   Sciatica    Past Surgical History:  Procedure Laterality Date   ACHILLES TENDON REPAIR Left    "& fixed a spur"   CARDIAC CATHETERIZATION  2012   CARDIAC CATHETERIZATION N/A 12/06/2016   Procedure: Left Heart Cath and Coronary Angiography;  Surgeon: Belva Crome, MD;  Location: [place] [facility];  Service: Cardiovascular;  Laterality: N/A;   COLONOSCOPY     FLEXIBLE SIGMOIDOSCOPY  X 2   HAND SURGERY Left    "thumb; cleaned out arthritis"   HEEL SPUR EXCISION Left    KNEE ARTHROSCOPY Right    SHOULDER SURGERY Right X 2   "cleaned out spurs"   TOE FUSION Right    great toe; plates and screws   TONSILLECTOMY  ~ [place] Right 09/22/2015   Procedure: TOTAL HIP ARTHROPLASTY;  Surgeon: Garald Balding, MD;  Location: [place];  Service: Orthopedics;  Laterality: Right;   TOTAL KNEE ARTHROPLASTY Right 05/06/2014   TOTAL KNEE ARTHROPLASTY Right 05/06/2014   Procedure: RIGHT TOTAL KNEE ARTHROPLASTY;  Surgeon: Garald Balding, MD;  Location: [place];  Service: Orthopedics;  Laterality: Right;   TOTAL KNEE ARTHROPLASTY Left 02/05/2019   Procedure: LEFT TOTAL KNEE ARTHROPLASTY;  Surgeon: Garald Balding, MD;  Location: [place];  Service: Orthopedics;  Laterality: Left;     Current Meds  Medication Sig   amoxicillin (AMOXIL) 500 MG tablet Take 4 tablets 2 hours prior to any dental procedure   aspirin 81 MG tablet Take 81 mg by mouth daily.   docusate sodium (COLACE) 100 MG capsule Take 300 mg by mouth daily.   furosemide (LASIX) 20 MG tablet TAKE 1 TABLET BY MOUTH EVERY MONDAY, WEDNESDAY, FRIDAY   isosorbide  mononitrate (IMDUR) 60 MG 24 hr tablet TAKE 1 TABLET(60 MG) BY MOUTH DAILY   lisinopril (PRINIVIL,ZESTRIL) 40 MG tablet Take 1 tablet (40 mg total) by mouth daily.   methocarbamol (ROBAXIN) 500 MG tablet Take 1 tablet (500 mg total) by mouth every 8 (eight) hours as needed for muscle spasms.   nitroGLYCERIN (NITROSTAT) 0.4 MG SL tablet PLACE 1 TABLET UNDER THE TONGUE EVERY 5 MINUTES AS NEEDED FOR CHEST PAIN   oxyCODONE (OXY IR/ROXICODONE) 5 MG immediate release tablet Take 1-2 tablets (5-10 mg total) by mouth every 4 (four) hours as needed for moderate pain (pain score 4-6).   rosuvastatin (CRESTOR) 20 MG tablet TAKE 1 TABLET(20 MG) BY MOUTH DAILY  Allergies:   Atorvastatin calcium [atorvastatin]; Simvastatin; and Pravastatin   Social History   Tobacco Use   Smoking status: Former Smoker    Packs/day: 2.00    Years: 10.00    Pack years: 20.00    Types: Cigarettes    Last attempt to quit: 1981    Years since quitting: 39.3   Smokeless tobacco: Never Used   Tobacco comment: quit smoking in 1981  Substance Use Topics   Alcohol use: Yes    Comment: "drink a beer sometimes once/month"   Drug use: No     Family Hx: The patient's family history includes CVA in his father and mother; Heart disease in his father.  ROS:   Please see the history of present illness.     All other systems reviewed and are negative.   Labs/Other Tests and Data Reviewed:    Recent Labs: 01/29/2019: ALT 8 02/06/2019: BUN 17; Creatinine, Ser 1.16; Hemoglobin 11.9; Platelets 163; Potassium 3.6; Sodium 134   Recent Lipid Panel Lab Results  Component Value Date/Time   CHOL 114 01/09/2018 08:25 AM   TRIG 71 01/09/2018 08:25 AM   HDL 45 01/09/2018 08:25 AM   CHOLHDL 2.5 01/09/2018 08:25 AM   CHOLHDL 2.9 04/27/2016 08:26 AM   LDLCALC 55 01/09/2018 08:25 AM    Wt Readings from Last 3 Encounters:  04/16/19 271 lb (122.9 kg)  03/22/19 271 lb (122.9 kg)  03/05/19 271 lb (122.9 kg)      Objective:    Vital Signs:  BP (!) 163/110 (BP Location: Left Arm, Patient Position: Sitting, Cuff Size: Normal)    Pulse 70    Ht 6' (1.829 m)    Wt 271 lb (122.9 kg)    BMI 36.75 kg/m    CONSTITUTIONAL:  Well nourished, well developed male in no acute distress.  EYES: anicteric MOUTH: oral mucosa is pink RESPIRATORY: Normal respiratory effort, symmetric expansion CARDIOVASCULAR: No peripheral edema SKIN: No rash, lesions or ulcers MUSCULOSKELETAL: no digital cyanosis NEURO: Cranial Nerves II-XII grossly intact, moves all extremities PSYCH: Intact judgement and insight.  A&O x 3, Mood/affect appropriate   ASSESSMENT & PLAN:    1.  ASCAD - cath 12/2017showed 20% RCA, 50% OM, 35% mid LCx, 70% D1, 65% mid LAD, 70% distal LADwho remains on medical management.  He had a nuclear stress test 6 months ago for CP which showed no ischemia.  He was under a lot of stress at that time.  He has not had any further chest pain or SOB. He will continue on ASA 81mg  daily, Imdur 60mg  daily and statin.   2.  Orthostatic hypotension - he has had some problems in the past with orthostatic hypotension which has resolved with wearing compression hose and adjusting BP meds.  He says this is not really a problem at all anymore and he is able to work outside for long periods of time without any problems.  Very rarely he will get a little woozy if he is bending up and down a lot.  3.  HTN -his BP is recently been elevated of unclear etiology.  He denies any increased sodium intake and does not salt his food.  He had a headache on Sunday and blood pressure was 177/109 mmHg.  He increase his lisinopril to 40 mg in the morning and 20 mg at night without any significant improvement.  I have instructed him to stay on lisinopril 40 mg daily.  I will add amlodipine 5 mg daily.  I  did ask him to let me know if he had any lower extremity edema that developed on this medicine.  I have asked him to check his blood pressure  daily at lunch for a week and keep a journal and send through my chart in a week his readings.  4.  Hyperlipidemia - his LDL goal is > 70  He is tolerating his statin without side effects.  His last LDL was 55 a year ago.  He will continue on Crestor 20mg  daily and I will repeat FLP and ALT in July once COVID 19 crisis has slowed down.   5.  COVID-19 Education: The signs and symptoms of COVID-19 were discussed with the patient and how to seek care for testing (follow up with PCP or arrange E-visit).  The importance of social distancing was discussed today.  Patient Risk:   After full review of this patient's clinical status, I feel that they are at least moderate risk at this time.  Time:   Today, I have spent 20 minutes directly with the patient on [facility] discussing medical problems including CAD, HTN, orthostatic hypotension and lipids.  We also reviewed the symptoms of COVID 19 and the ways to protect against contracting the virus with telehealth technology.  I spent an additional 10 minutes reviewing patient's chart including 2D echo, nuclear stress test and labs for lipids and LFTs.  Medication Adjustments/Labs and Tests Ordered: Current medicines are reviewed at length with the patient today.  Concerns regarding medicines are outlined above.  Tests Ordered: No orders of the defined types were placed in this encounter.  Medication Changes: No orders of the defined types were placed in this encounter.   Disposition:  Follow up in 1 year(s)  Signed, Fransico Him, MD  04/16/2019 8:36 AM    [facility]

## 2019-04-16 ENCOUNTER — Telehealth (INDEPENDENT_AMBULATORY_CARE_PROVIDER_SITE_OTHER): Payer: Medicare Other | Admitting: Cardiology

## 2019-04-16 ENCOUNTER — Encounter: Payer: Self-pay | Admitting: Cardiology

## 2019-04-16 ENCOUNTER — Other Ambulatory Visit: Payer: Self-pay

## 2019-04-16 VITALS — BP 163/110 | HR 70 | Ht 72.0 in | Wt 271.0 lb

## 2019-04-16 DIAGNOSIS — Z7189 Other specified counseling: Secondary | ICD-10-CM

## 2019-04-16 DIAGNOSIS — E7800 Pure hypercholesterolemia, unspecified: Secondary | ICD-10-CM | POA: Diagnosis not present

## 2019-04-16 DIAGNOSIS — I10 Essential (primary) hypertension: Secondary | ICD-10-CM | POA: Diagnosis not present

## 2019-04-16 DIAGNOSIS — I2510 Atherosclerotic heart disease of native coronary artery without angina pectoris: Secondary | ICD-10-CM | POA: Diagnosis not present

## 2019-04-16 DIAGNOSIS — I951 Orthostatic hypotension: Secondary | ICD-10-CM

## 2019-04-16 MED ORDER — LISINOPRIL 40 MG PO TABS: 40.0000 mg | ORAL_TABLET | Freq: Every day | ORAL | 3 refills | Status: DC

## 2019-04-16 MED ORDER — AMLODIPINE BESYLATE 5 MG PO TABS: 5.0000 mg | ORAL_TABLET | Freq: Every day | ORAL | 3 refills | Status: DC

## 2019-04-16 NOTE — Patient Instructions (Signed)
Medication Instructions:  Start: Amlodipine 5 mg, daily, by mouth  If you need a refill on your cardiac medications before your next appointment, please call your pharmacy.   Lab work: Fasting Labs: 07/08/19: Lipid and Liver  If you have labs (blood work) drawn today and your tests are completely normal, you will receive your results only by: [name] Kitchen MyChart Message (if you have MyChart) OR . A paper copy in the mail If you have any lab test that is abnormal or we need to change your treatment, we will call you to review the results.  Testing/Procedures: None  Follow-Up: At [facility], you and your health needs are our priority.  As part of our continuing mission to provide you with exceptional heart care, we have created designated Provider Care Teams.  These Care Teams include your primary Cardiologist (physician) and Advanced Practice Providers (APPs -  Physician Assistants and Nurse Practitioners) who all work together to provide you with the care you need, when you need it. You will need a follow up appointment in 1 years.  Please call our office 2 months in advance to schedule this appointment.  You may see Fransico Him, MD or one of the following Advanced Practice Providers on your designated Care Team:   [place], PA-C Melina Copa, PA-C . Ermalinda Barrios, PA-C  Check your blood pressure at [facility] for 1 week and send a MyChart with the results.

## 2019-05-21 ENCOUNTER — Encounter: Payer: Self-pay | Admitting: Orthopaedic Surgery

## 2019-07-08 ENCOUNTER — Other Ambulatory Visit: Payer: Self-pay

## 2019-07-08 ENCOUNTER — Other Ambulatory Visit: Payer: Medicare Other | Admitting: *Deleted

## 2019-07-08 DIAGNOSIS — E7800 Pure hypercholesterolemia, unspecified: Secondary | ICD-10-CM | POA: Diagnosis not present

## 2019-07-08 LAB — LIPID PANEL
Chol/HDL Ratio: 2.1 ratio (ref 0.0–5.0)
Cholesterol, Total: 119 mg/dL (ref 100–199)
HDL: 56 mg/dL (ref >39)
LDL Calculated: 53 mg/dL (ref 0–99)
Triglycerides: 52 mg/dL (ref 0–149)
VLDL Cholesterol Cal: 10 mg/dL (ref 5–40)

## 2019-07-08 LAB — HEPATIC FUNCTION PANEL
ALT: 6 IU/L (ref 0–44)
AST: 16 IU/L (ref 0–40)
Albumin: 4.2 g/dL (ref 3.7–4.7)
Alkaline Phosphatase: 63 IU/L (ref 39–117)
Bilirubin Total: 0.4 mg/dL (ref 0.0–1.2)
Bilirubin, Direct: 0.16 mg/dL (ref 0.00–0.40)
Total Protein: 6.3 g/dL (ref 6.0–8.5)

## 2019-07-09 ENCOUNTER — Encounter: Payer: Self-pay | Admitting: Orthopaedic Surgery

## 2019-07-09 ENCOUNTER — Ambulatory Visit (INDEPENDENT_AMBULATORY_CARE_PROVIDER_SITE_OTHER): Payer: Medicare Other | Admitting: Orthopaedic Surgery

## 2019-07-09 VITALS — BP 132/82 | HR 82 | Ht 72.0 in | Wt 271.0 lb

## 2019-07-09 DIAGNOSIS — Z96652 Presence of left artificial knee joint: Secondary | ICD-10-CM

## 2019-07-09 DIAGNOSIS — I2510 Atherosclerotic heart disease of native coronary artery without angina pectoris: Secondary | ICD-10-CM

## 2019-07-09 NOTE — Progress Notes (Signed)
Office Visit Note   Patient: Christian [name]           Date of Birth: [birth year]           MRN: [id number] Visit Date: 07/09/2019              Requested by: Alroy Dust, L.Marlou Sa, [address] [employer] [address] [place],  [place] [id number] PCP: Alroy Dust, L.Marlou Sa, MD   Assessment & Plan: Visit Diagnoses:  1. History of left knee replacement     Plan: 5 months status post primary left total knee replacement doing well.  Does have some clicking in his knee and occasional swelling but able to do any and all activities without compromise.  No significant pain.  I have urged him to work on strengthening of his quads and hamstrings.  Working on his weight.  I think his knee is minimally loose but should improve with strengthening.  Office 7 months or 1 year from the time of surgery  Follow-Up Instructions: Return in about 6 months (around 01/09/2020).   Orders:  No orders of the defined types were placed in this encounter.  No orders of the defined types were placed in this encounter.     Procedures: No procedures performed   Clinical Data: No additional findings.   Subjective: Chief Complaint  Patient presents with  . Left Knee - Follow-up    Left knee TKA DOS 02/05/19  Patient presents today for a four month follow up. He is now five months out from left total knee arthroplasty. He has noticed that his knee has been clicking since March. He said that his knee will give way with no warning. He has minimal pain.  Better in the last week or 2  HPI  Review of Systems   Objective: Vital Signs: BP 132/82   Pulse 82   Ht 6' (1.829 m)   Wt 271 lb (122.9 kg)   BMI 36.75 kg/m   Physical Exam Constitutional:      Appearance: He is well-developed.  Eyes:     Pupils: Pupils are equal, round, and reactive to light.  Pulmonary:     Effort: Pulmonary effort is normal.  Skin:    General: Skin is warm and dry.  Neurological:     Mental Status: He is alert and oriented to person,  place, and time.  Psychiatric:        Behavior: Behavior normal.     Ortho Exam left knee might have a small effusion.  Full extension over 105 degrees of flexion.  Little opening medially with a valgus stress of probably 4 to 5 mm none laterally.  Has a little bit of an anterior drawer sign but similar on the right total knee replacement.  No localized areas of tenderness.  Good strength and full extension  Specialty Comments:  No specialty comments available.  Imaging: No results found.   PMFS History: Patient Active Problem List   Diagnosis Date Noted  . Primary osteoarthritis of left knee 02/05/2019  . History of left knee replacement 02/05/2019  . PVC's (premature ventricular contractions)   . Dyspnea 12/06/2016  . Osteoarthritis of right hip 09/22/2015  . S/P total hip arthroplasty 09/22/2015  . Obesity 05/08/2014  . Osteoarthritis of knee 05/08/2014  . S/P total knee replacement using cement 05/06/2014  . Knee pain 04/08/2014  . Preoperative clearance 04/08/2014  . Coronary artery disease   . Diastolic dysfunction   . Hypertension   . Orthostatic hypotension   .  Hypercholesterolemia    Past Medical History:  Diagnosis Date  . Arthritis    "joints; shoulders; left elbow; right thumb" (05/06/2014)  . Cataracts, bilateral    immature  . Constipation    takes Colace and Metamucil daily  . Coronary artery disease 02/2011   cath 11/2016 showing 20% RCA, 50% OM, 35% mid LCx, 70% D1, 65% mid LAD, 70% distal LAD 80% on medical management  . Diastolic dysfunction   . Heart murmur   . Hemorrhoids   . History of bronchitis   . Hypercholesterolemia    takes Crestor daily  . Hypertension   . Joint pain   . Nocturia   . Orthostatic hypotension   . PVC's (premature ventricular contractions)    PVC load 0.2% by Holter 12/2016  . Sciatica     Family History  Problem Relation Age of Onset  . CVA Mother   . CVA Father   . Heart disease Father     Past Surgical  History:  Procedure Laterality Date  . ACHILLES TENDON REPAIR Left    "& fixed a spur"  . CARDIAC CATHETERIZATION  2012  . CARDIAC CATHETERIZATION N/A 12/06/2016   Procedure: Left Heart Cath and Coronary Angiography;  Surgeon: Belva Crome, MD;  Location: [place] [facility];  Service: Cardiovascular;  Laterality: N/A;  . COLONOSCOPY    . FLEXIBLE SIGMOIDOSCOPY  X 2  . HAND SURGERY Left    "thumb; cleaned out arthritis"  . HEEL SPUR EXCISION Left   . KNEE ARTHROSCOPY Right   . SHOULDER SURGERY Right X 2   "cleaned out spurs"  . TOE FUSION Right    great toe; plates and screws  . TONSILLECTOMY  ~ 1950  . TOTAL HIP ARTHROPLASTY Right 09/22/2015   Procedure: TOTAL HIP ARTHROPLASTY;  Surgeon: Garald Balding, MD;  Location: [place];  Service: Orthopedics;  Laterality: Right;  . TOTAL KNEE ARTHROPLASTY Right 05/06/2014  . TOTAL KNEE ARTHROPLASTY Right 05/06/2014   Procedure: RIGHT TOTAL KNEE ARTHROPLASTY;  Surgeon: Garald Balding, MD;  Location: [place];  Service: Orthopedics;  Laterality: Right;  . TOTAL KNEE ARTHROPLASTY Left 02/05/2019   Procedure: LEFT TOTAL KNEE ARTHROPLASTY;  Surgeon: Garald Balding, MD;  Location: [place];  Service: Orthopedics;  Laterality: Left;   Social History   Occupational History  . Not on file  Tobacco Use  . Smoking status: Former Smoker    Packs/day: 2.00    Years: 10.00    Pack years: 20.00    Types: Cigarettes    Quit date: 1981    Years since quitting: 39.5  . Smokeless tobacco: Never Used  . Tobacco comment: quit smoking in 1981  Substance and Sexual Activity  . Alcohol use: Yes    Comment: "drink a beer sometimes once/month"  . Drug use: No  . Sexual activity: Never

## 2019-08-01 ENCOUNTER — Telehealth: Payer: Self-pay

## 2019-08-01 ENCOUNTER — Other Ambulatory Visit: Payer: Self-pay

## 2019-08-01 MED ORDER — LISINOPRIL 40 MG PO TABS: 20.0000 mg | ORAL_TABLET | Freq: Every day | ORAL | 3 refills | Status: DC

## 2019-08-01 NOTE — Telephone Encounter (Signed)
Pt sent a mychart message stating his HR was 44 around noon, He has not taken any medications since 7am.  called pt to discuss. Pt reports feeling weak at times. He says he has been exercising more and loosing weight. He approximates loosing 5 pounds in the past 3 weeks. He reports being lightheaded when bending over or exerting himself.  His current BP is 106/68 and HR of 74 Advised pt to call back if HR is below 60 bpm again Pt agreeable, will route to [place] for advisement

## 2019-08-01 NOTE — Telephone Encounter (Signed)
Decrease lisinopril to 20mg  daily and check BP and HR daily for a week and call with results.

## 2019-08-01 NOTE — Progress Notes (Signed)
Spoke with pt about changing Lisinopril dose from 40mg  to 20mg  as well as keeping daily BP/HR log and calling back in a week with results. Pt verbalized understanding.

## 2019-09-20 ENCOUNTER — Ambulatory Visit: Payer: Self-pay | Admitting: Orthopaedic Surgery

## 2019-09-20 DIAGNOSIS — K5900 Constipation, unspecified: Secondary | ICD-10-CM | POA: Diagnosis not present

## 2019-09-20 DIAGNOSIS — I10 Essential (primary) hypertension: Secondary | ICD-10-CM | POA: Diagnosis not present

## 2019-09-20 DIAGNOSIS — Z0000 Encounter for general adult medical examination without abnormal findings: Secondary | ICD-10-CM | POA: Diagnosis not present

## 2019-09-20 DIAGNOSIS — I2510 Atherosclerotic heart disease of native coronary artery without angina pectoris: Secondary | ICD-10-CM | POA: Diagnosis not present

## 2019-09-20 DIAGNOSIS — E7800 Pure hypercholesterolemia, unspecified: Secondary | ICD-10-CM | POA: Diagnosis not present

## 2019-09-20 DIAGNOSIS — M150 Primary generalized (osteo)arthritis: Secondary | ICD-10-CM | POA: Diagnosis not present

## 2019-09-24 ENCOUNTER — Other Ambulatory Visit: Payer: Self-pay | Admitting: Physician Assistant

## 2019-09-24 ENCOUNTER — Other Ambulatory Visit: Payer: Self-pay | Admitting: Cardiology

## 2019-09-24 DIAGNOSIS — E7800 Pure hypercholesterolemia, unspecified: Secondary | ICD-10-CM

## 2019-09-24 DIAGNOSIS — I10 Essential (primary) hypertension: Secondary | ICD-10-CM

## 2019-11-07 DIAGNOSIS — H2513 Age-related nuclear cataract, bilateral: Secondary | ICD-10-CM | POA: Diagnosis not present

## 2019-11-07 DIAGNOSIS — H40013 Open angle with borderline findings, low risk, bilateral: Secondary | ICD-10-CM | POA: Diagnosis not present

## 2019-11-07 DIAGNOSIS — H04123 Dry eye syndrome of bilateral lacrimal glands: Secondary | ICD-10-CM | POA: Diagnosis not present

## 2019-11-07 DIAGNOSIS — H25013 Cortical age-related cataract, bilateral: Secondary | ICD-10-CM | POA: Diagnosis not present

## 2019-11-11 DIAGNOSIS — H2513 Age-related nuclear cataract, bilateral: Secondary | ICD-10-CM | POA: Diagnosis not present

## 2019-11-11 DIAGNOSIS — H25013 Cortical age-related cataract, bilateral: Secondary | ICD-10-CM | POA: Diagnosis not present

## 2019-11-11 DIAGNOSIS — H2511 Age-related nuclear cataract, right eye: Secondary | ICD-10-CM | POA: Diagnosis not present

## 2019-11-13 ENCOUNTER — Encounter: Payer: Self-pay | Admitting: Orthopaedic Surgery

## 2019-11-13 NOTE — Telephone Encounter (Signed)
Please send in Rx per post joint replacement protocol-thANKS

## 2019-11-14 ENCOUNTER — Other Ambulatory Visit: Payer: Self-pay | Admitting: Orthopaedic Surgery

## 2019-11-14 MED ORDER — AMOXICILLIN 500 MG PO TABS: ORAL_TABLET | ORAL | 0 refills | Status: DC

## 2019-11-29 ENCOUNTER — Telehealth: Payer: Self-pay

## 2019-11-29 MED ORDER — LISINOPRIL 40 MG PO TABS: 20.0000 mg | ORAL_TABLET | Freq: Every day | ORAL | 3 refills | Status: DC

## 2019-11-29 NOTE — Telephone Encounter (Signed)
Stop amlodipine and Lasix and only take Lisinopril if SBP> 151mmhg.  Make sure he is using compression hose for his orthostatic hypotension.  Please get him in with extender on Monday

## 2019-11-29 NOTE — Telephone Encounter (Signed)
-----   Message from Sueanne Margarita, MD sent at 11/29/2019 11:19 AM EST ----- Received a not from patients insurance care manager that his BP has been running low.  Please call patient and verify this and what it has been running

## 2019-11-29 NOTE — Telephone Encounter (Addendum)
Called pt and he reports that his BP has been running low on and off for the past few months.. he has increased fatigue when it is running low. He says is happens at random times of the day.   He does not have his BP readings recorded but he was able to give me some information for his cuff at home...   Over several weeks:  105/60, 101/66, 107.65, 112/66  On 09/26/19 he felt very weak nad his HR was 39 and does not know what his BP was..   09/28/19 His BP 69/48.   He reports that his HR is usually 70-80 bpm.   He says he drinks fluids throughout the day. He denies peripheral edema, sob, no dizziness... just fatigue. He says he is very tired and can tell when his BP drops.   I have asked him to check his BP at least twice a day with his HR and to record on paper and any symptoms he has when he checks it.   I asked him to call or send through My Chart for [name] to review.   Will forward to [name] for review.

## 2019-11-29 NOTE — Telephone Encounter (Signed)
Pt verbalized understanding of med changes per [name]... there is no available APP appt early next week unless virtual.. he is having cataract surgery Wed 12/04/19.   Will forward back to [name]... pt will record his BP and HR and will start wearing his compression stockings.

## 2019-12-02 ENCOUNTER — Encounter: Payer: Self-pay | Admitting: Physician Assistant

## 2019-12-02 NOTE — Telephone Encounter (Signed)
He is fine to be virtual

## 2019-12-02 NOTE — Telephone Encounter (Signed)
Left message for patient. I put him on the schedule with Sharrell Ku, PA-C for a virtual visit at 2 pm in regards to his low BP.

## 2019-12-02 NOTE — Progress Notes (Addendum)
Virtual Visit via Telephone Note   This visit type was conducted due to national recommendations for restrictions regarding the COVID-19 Pandemic (e.g. social distancing) in an effort to limit this patient's exposure and mitigate transmission in our community.  Due to his co-morbid illnesses, this patient is at least at moderate risk for complications without adequate follow up.  This format is felt to be most appropriate for this patient at this time.  The patient did not have access to video technology/had technical difficulties with video requiring transitioning to audio format only (telephone).  All issues noted in this document were discussed and addressed.  No physical exam could be performed with this format.  Please refer to the patient's chart for his  consent to telehealth for [facility]. Virtual platform was offered given ongoing worsening Covid-19 pandemic.  Date:  12/03/2019   ID:  Christian [name], DOB [birth year], MRN [id number]  Patient Location: Home Provider Location: Home  PCP:  Alroy Dust, Carlean Jews.Marlou Sa, MD  Cardiologist:  Fransico Him, MD  Electrophysiologist:  None   Evaluation Performed:  Follow-Up Visit  Chief Complaint:  F/u blood pressure/HR issues  History of Present Illness:    Christian [name] is a 73 y.o. male with CAD by cath 11/2016, HTN also with orthostatic hypertension, hyperlipidemia, arthritis, cataracts, HLD, PVCs, sciatica who presents for evaluation of low blood pressure and heart rate. He had a prior heart cath in 2017 showing mild-moderate LAD, circumflex and RCA disease without high grade obstruction. LVEDP was elevated, EF was 50%. Medical therapy was recommended. PVCs were noted during the study and f/u Holter was done 12/2016 showing NSR with frequent PACs, nonsustained atrial tachycardia up to 6-9 beats, frequent PVCs, bigeminal PVCs and trigeminal PVCs. PVC load was 2.8%. He had issues with recurrent CP in 09/2018. Echo showed EF 60-65%, grade 1 DD,  mild AI, mild LAE/RAE.  Stress test was normal. Last labs 06/2019 with normal LFTs, LDL 53, 01/2019 Hgb 11.9, Na 134, Cr 1.16, AST/ALT OK.  He presents virtually for evaluation of low blood pressure and heart rate variability.  He recently got a routine call from his insurance company medical monitoring team at which time he reported episodic low blood pressures in the [id number] systolic. The insurance company reached out to [name] who in turn had our clinic reach out to the patient.  He reported blood pressures running 105/60, 101/66, 107/65, 112/66. On one occasion 09/28/19 he'd even had a BP of 69/48.  [name] recommended to stop his amlodipine and Lasix.  There was a recommendation to take lisinopril only as needed for elevated blood pressure, but apparently the patient did not receive this information as planned.  He has still been taking it (20mg  daily).  His blood pressures have still been running at the lower end of normal (99-117 per his report).  Sometimes he feels randomly weak. On 09/26/19 he felt very weak and his HR was 39 but he did not check his BP at that time.  He also recalls a time when his HR was in the 40s a few weeks later, checked via a home monitor, not confirmed with EKG technology. The weak spells happen 1-2x per week. In between these episodes, though, he feels pretty good and HR tends to run in the 50s.  He has not had any recent anginal type chest pain or dyspnea. He had one brief episode of what he thought was heartburn but states it was extremely minor and was of  no concern to him.  He denies any palpitations, presyncope or syncope.  The patient does not have symptoms concerning for COVID-19 infection (fever, chills, cough, or new shortness of breath).    Past Medical History:  Diagnosis Date   Arthritis    "joints; shoulders; left elbow; right thumb" (05/06/2014)   Atrial tachycardia (HCC)    Cataracts, bilateral    immature   Constipation    takes Colace and  Metamucil daily   Coronary artery disease 02/2011   cath 11/2016 showing 20% RCA, 50% OM, 35% mid LCx, 70% D1, 65% mid LAD, 70% distal LAD 80% on medical management   Diastolic dysfunction    Heart murmur    Hemorrhoids    History of bronchitis    Hypercholesterolemia    takes Crestor daily   Hypertension    Joint pain    Nocturia    Orthostatic hypotension    Premature atrial contractions    PVC's (premature ventricular contractions)    PVC load 2% by Holter 12/2016   Sciatica    Past Surgical History:  Procedure Laterality Date   ACHILLES TENDON REPAIR Left    "& fixed a spur"   CARDIAC CATHETERIZATION  2012   CARDIAC CATHETERIZATION N/A 12/06/2016   Procedure: Left Heart Cath and Coronary Angiography;  Surgeon: Belva Crome, MD;  Location: [place] [facility];  Service: Cardiovascular;  Laterality: N/A;   COLONOSCOPY     FLEXIBLE SIGMOIDOSCOPY  X 2   HAND SURGERY Left    "thumb; cleaned out arthritis"   HEEL SPUR EXCISION Left    KNEE ARTHROSCOPY Right    SHOULDER SURGERY Right X 2   "cleaned out spurs"   TOE FUSION Right    great toe; plates and screws   TONSILLECTOMY  ~ Walker Right 09/22/2015   Procedure: TOTAL HIP ARTHROPLASTY;  Surgeon: Garald Balding, MD;  Location: [place];  Service: Orthopedics;  Laterality: Right;   TOTAL KNEE ARTHROPLASTY Right 05/06/2014   TOTAL KNEE ARTHROPLASTY Right 05/06/2014   Procedure: RIGHT TOTAL KNEE ARTHROPLASTY;  Surgeon: Garald Balding, MD;  Location: [place];  Service: Orthopedics;  Laterality: Right;   TOTAL KNEE ARTHROPLASTY Left 02/05/2019   Procedure: LEFT TOTAL KNEE ARTHROPLASTY;  Surgeon: Garald Balding, MD;  Location: [place];  Service: Orthopedics;  Laterality: Left;     Current Meds  Medication Sig   amoxicillin (AMOXIL) 500 MG tablet Take 4 tablets 2 hours prior to any dental procedure   aspirin 81 MG tablet Take 81 mg by mouth daily.   docusate sodium (COLACE)  100 MG capsule Take 300 mg by mouth daily.   isosorbide mononitrate (IMDUR) 60 MG 24 hr tablet TAKE 1 TABLET BY MOUTH EVERY DAY   lisinopril (ZESTRIL) 40 MG tablet Take 0.5 tablets (20 mg total) by mouth daily. Take only when systolic BP is above [id number].   methocarbamol (ROBAXIN) 500 MG tablet Take 1 tablet (500 mg total) by mouth every 8 (eight) hours as needed for muscle spasms.   nitroGLYCERIN (NITROSTAT) 0.4 MG SL tablet PLACE 1 TABLET UNDER THE TONGUE EVERY 5 MINUTES AS NEEDED FOR CHEST PAIN   rosuvastatin (CRESTOR) 20 MG tablet TAKE 1 TABLET BY MOUTH EVERY DAY     Allergies:   Atorvastatin calcium [atorvastatin], Simvastatin, and Pravastatin   Social History   Tobacco Use   Smoking status: Former Smoker    Packs/day: 2.00    Years: 10.00  Pack years: 20.00    Types: Cigarettes    Quit date: 73    Years since quitting: 39.9   Smokeless tobacco: Never Used   Tobacco comment: quit smoking in 1981  Substance Use Topics   Alcohol use: Yes    Comment: "drink a beer sometimes once/month"   Drug use: No     Family Hx: The patient's family history includes CVA in his father and mother; Heart disease in his father.  ROS:   Please see the history of present illness.    All other systems reviewed and are negative.   Prior CV studies:    Most recent pertinent cardiac studies are outlined above.  Labs/Other Tests and Data Reviewed:    EKG:  An ECG dated 01/29/19 was personally reviewed today and demonstrated:  NSR 61bpm, inferior Q waves III, avF with TWI III otherwise no acute changes  Recent Labs: 02/06/2019: BUN 17; Creatinine, Ser 1.16; Hemoglobin 11.9; Platelets 163; Potassium 3.6; Sodium 134 07/08/2019: ALT 6   Recent Lipid Panel Lab Results  Component Value Date/Time   CHOL 119 07/08/2019 09:07 AM   TRIG 52 07/08/2019 09:07 AM   HDL 56 07/08/2019 09:07 AM   CHOLHDL 2.1 07/08/2019 09:07 AM   CHOLHDL 2.9 04/27/2016 08:26 AM   LDLCALC 53 07/08/2019 09:07 AM      Wt Readings from Last 3 Encounters:  12/03/19 271 lb (122.9 kg)  07/09/19 271 lb (122.9 kg)  04/16/19 271 lb (122.9 kg)     Objective:    Vital Signs:  BP 111/77    Pulse (!) 57    Ht 5\' 11"  (1.803 m)    Wt 271 lb (122.9 kg)    BMI 37.80 kg/m    VS reviewed. General - calm M in no acute distress Pulm - No labored breathing, no coughing during visit, no audible wheezing, speaking in full sentences Neuro - A+Ox3, no slurred speech, answers questions appropriately Psych - Pleasant affect     ASSESSMENT & PLAN:    1. Low blood pressure - this has improved somewhat with discontinuation of amlodipine and Lasix.  He did not receive the information about taking lisinopril as needed so has been taking 20 mg daily.  We will reduce this further to 10 mg daily.  I told him to call our office if his blood pressure remains less than A999333 systolic at which time I would suggest we reduce lisinopril further to 5 mg a day or perhaps even stop it based on what those blood pressure readings look like.  It is also unclear why his pressure is running lower recently, as he was hypertensive in the springtime.  He has not lost any weight but is trying to do so.  I will also therefore arrange for him to have routine blood work including hemoglobin, electrolytes, and thyroid to exclude metabolic reason for why his blood pressure has been trending downward. 2. Abnormal heart rate and possible bradycardia with h/o PVCs, PACs - he reports intermittent episodes of capturing a heart rate in the upper 30s or 40s by home monitor.  He does have a history of bigeminal and trigeminal PVCs which could potentially be causing some pseudobradycardia that is caused by a home monitor not picking up his ectopy.  However, he does feel somewhat weak during these times.  We will therefore arrange a 7-day ZIO patch to evaluate for any change in PVC burden or presence of true bradycardia. Check electrolytes as above. 3. CAD -  he denies  any recent concerning episodes of chest discomfort.  He had a minor episode of what he felt was true heartburn, but no exertional anginal symptoms.  No dyspnea.  Continue aspirin and statin.  He reports he is having cataract surgery tomorrow as well as in the upcoming weeks.  From a cardiac standpoint, cataract surgery is generally low risk and I feel he can proceed with this from a heart standpoint. 4. Hyperlipidemia - lipids were checked earlier this year and were satisfactory.  COVID-19 Education: The signs and symptoms of COVID-19 were discussed with the patient and how to seek care for testing (follow up with PCP or arrange E-visit).  The importance of social distancing was discussed today.  Time:   Today, I have spent 20 minutes with the patient with telehealth technology discussing the above problems. Additional 5 minutes spent reviewing chart prior to visit.   Medication Adjustments/Labs and Tests Ordered: Current medicines are reviewed at length with the patient today.  Concerns regarding medicines are outlined above.   Follow Up: Virtually in 3 weeks with me, [name] or APP  Signed, Charlie Pitter, PA-C  12/03/2019 2:27 PM    [place]

## 2019-12-03 ENCOUNTER — Encounter: Payer: Self-pay | Admitting: Physician Assistant

## 2019-12-03 ENCOUNTER — Other Ambulatory Visit: Payer: Self-pay

## 2019-12-03 ENCOUNTER — Telehealth (INDEPENDENT_AMBULATORY_CARE_PROVIDER_SITE_OTHER): Payer: Medicare Other | Admitting: Physician Assistant

## 2019-12-03 ENCOUNTER — Telehealth: Payer: Self-pay | Admitting: Radiology

## 2019-12-03 VITALS — BP 111/77 | HR 57 | Ht 71.0 in | Wt 271.0 lb

## 2019-12-03 DIAGNOSIS — E785 Hyperlipidemia, unspecified: Secondary | ICD-10-CM

## 2019-12-03 DIAGNOSIS — I959 Hypotension, unspecified: Secondary | ICD-10-CM | POA: Diagnosis not present

## 2019-12-03 DIAGNOSIS — I491 Atrial premature depolarization: Secondary | ICD-10-CM

## 2019-12-03 DIAGNOSIS — R009 Unspecified abnormalities of heart beat: Secondary | ICD-10-CM

## 2019-12-03 DIAGNOSIS — I2510 Atherosclerotic heart disease of native coronary artery without angina pectoris: Secondary | ICD-10-CM | POA: Diagnosis not present

## 2019-12-03 DIAGNOSIS — R001 Bradycardia, unspecified: Secondary | ICD-10-CM

## 2019-12-03 DIAGNOSIS — I493 Ventricular premature depolarization: Secondary | ICD-10-CM

## 2019-12-03 MED ORDER — LISINOPRIL 10 MG PO TABS: 10.0000 mg | ORAL_TABLET | Freq: Every day | ORAL | 3 refills | Status: DC

## 2019-12-03 NOTE — Patient Instructions (Addendum)
Medication Instructions:  Your physician has recommended you make the following change in your medication:  1.  DECREASE the Lisinopril to 10 mg taking 1 tablet daily.  A new prescription has been sent to [facility].  *If you need a refill on your cardiac medications before your next appointment, please call your pharmacy*  Lab Work: 12/17/2019"  Come to the office for lab work: BMET, CBC, TSH, & MAG  If you have labs (blood work) drawn today and your tests are completely normal, you will receive your results only by: [name] Kitchen MyChart Message (if you have MyChart) OR . A paper copy in the mail If you have any lab test that is abnormal or we need to change your treatment, we will call you to review the results.  Testing/Procedures: [place] [place]- Long Term Monitor Instructions   Your physician has requested you wear your ZIO patch monitor FOR 7 days. (THIS WILL BE MAILED TO YOU, AND HAS BEEN ORDERED TODAY.  IT NORMALLY TAKES 2-3 DAYS TO RECEIVE, BUT REMEMBER, HOLIDAY MAIL, COULD MAKE IT A COUPLE DAYS LONGER.   This is a single patch monitor.  [employer] supplies one patch monitor per enrollment.  Additional stickers are not available.   Please do not apply patch if you will be having a Nuclear Stress Test, Echocardiogram, Cardiac CT, MRI, or Chest Xray during the time frame you would be wearing the monitor. The patch cannot be worn during these tests.  You cannot remove and re-apply the ZIO XT patch monitor.   Your ZIO patch monitor will be sent USPS Priority mail from [facility] directly to your home address. The monitor may also be mailed to a PO BOX if home delivery is not available.   It may take 3-5 days to receive your monitor after you have been enrolled.   Once you have received you monitor, please review enclosed instructions.  Your monitor has already been registered assigning a specific monitor serial # to you.   Applying the monitor   Shave hair from upper left chest.   Hold  abrader disc by orange tab.  Rub abrader in 40 strokes over left upper chest as indicated in your monitor instructions.   Clean area with 4 enclosed alcohol pads .  Use all pads to assure are is cleaned thoroughly.  Let dry.   Apply patch as indicated in monitor instructions.  Patch will be place under collarbone on left side of chest with arrow pointing upward.   Rub patch adhesive wings for 2 minutes.Remove white label marked "1".  Remove white label marked "2".  Rub patch adhesive wings for 2 additional minutes.   While looking in a mirror, press and release button in center of patch.  A small green light will flash 3-4 times .  This will be your only indicator the monitor has been turned on.     Do not shower for the first 24 hours.  You may shower after the first 24 hours.   Press button if you feel a symptom. You will hear a small click.  Record Date, Time and Symptom in the Patient Log Book.   When you are ready to remove patch, follow instructions on last 2 pages of Patient Log Book.  Stick patch monitor onto last page of Patient Log Book.   Place Patient Log Book in [place] [employer] [place] box.  Use locking tab on box and tape box closed securely.  The [employer] has prepaid  postage on it.  Please place in mailbox as soon as possible.  Your physician should have your test results approximately 7 days after the monitor has been mailed back to [facility].   Call [place] at [phone] if you have questions regarding your ZIO XT patch monitor.  Call them immediately if you see an orange light blinking on your monitor.   If your monitor falls off in less than 4 days contact our Monitor department at [phone].  If your monitor becomes loose or falls off after 4 days call [employer] at [phone] for suggestions on securing your monitor.    Follow-Up: At [facility], you and your health needs are our priority.  As part of our continuing mission to  provide you with exceptional heart care, we have created designated Provider Care Teams.  These Care Teams include your primary Cardiologist (physician) and Advanced Practice Providers (APPs -  Physician Assistants and Nurse Practitioners) who all work together to provide you with the care you need, when you need it.  Your next appointment:   You are scheduled for a VIRTUAL APPOINTMENT, 12/24/2019 at 1:45.  DO NOT COME TO THE OFFICE FOR THIS.[name] Kitchen  You will have a visit with Richardson Dopp, PA-C on this day.  Other Instructions If your blood pressure still runs less than 110 on this regimen, to stop lisinopril and call us for further instructions

## 2019-12-03 NOTE — Telephone Encounter (Signed)
Enrolled patient for a 7 day Zio monitor to be mailed to patients home.  

## 2019-12-04 DIAGNOSIS — H2511 Age-related nuclear cataract, right eye: Secondary | ICD-10-CM | POA: Diagnosis not present

## 2019-12-04 DIAGNOSIS — H25811 Combined forms of age-related cataract, right eye: Secondary | ICD-10-CM | POA: Diagnosis not present

## 2019-12-06 ENCOUNTER — Other Ambulatory Visit: Payer: Medicare Other | Admitting: *Deleted

## 2019-12-06 ENCOUNTER — Other Ambulatory Visit: Payer: Self-pay

## 2019-12-06 DIAGNOSIS — I959 Hypotension, unspecified: Secondary | ICD-10-CM

## 2019-12-06 DIAGNOSIS — E785 Hyperlipidemia, unspecified: Secondary | ICD-10-CM | POA: Diagnosis not present

## 2019-12-06 DIAGNOSIS — R009 Unspecified abnormalities of heart beat: Secondary | ICD-10-CM

## 2019-12-06 DIAGNOSIS — I493 Ventricular premature depolarization: Secondary | ICD-10-CM

## 2019-12-06 DIAGNOSIS — I2510 Atherosclerotic heart disease of native coronary artery without angina pectoris: Secondary | ICD-10-CM | POA: Diagnosis not present

## 2019-12-06 DIAGNOSIS — R001 Bradycardia, unspecified: Secondary | ICD-10-CM | POA: Diagnosis not present

## 2019-12-06 DIAGNOSIS — I491 Atrial premature depolarization: Secondary | ICD-10-CM

## 2019-12-06 LAB — TSH: TSH: 4.4 u[IU]/mL (ref 0.450–4.500)

## 2019-12-06 LAB — BASIC METABOLIC PANEL
BUN/Creatinine Ratio: 13 (ref 10–24)
BUN: 16 mg/dL (ref 8–27)
CO2: 22 mmol/L (ref 20–29)
Calcium: 9.4 mg/dL (ref 8.6–10.2)
Chloride: 102 mmol/L (ref 96–106)
Creatinine, Ser: 1.24 mg/dL (ref 0.76–1.27)
GFR calc Af Amer: 67 mL/min/{1.73_m2} (ref >59)
GFR calc non Af Amer: 58 mL/min/{1.73_m2} — ABNORMAL LOW (ref >59)
Glucose: 89 mg/dL (ref 65–99)
Potassium: 4.6 mmol/L (ref 3.5–5.2)
Sodium: 137 mmol/L (ref 134–144)

## 2019-12-06 LAB — MAGNESIUM: Magnesium: 2.2 mg/dL (ref 1.6–2.3)

## 2019-12-06 LAB — CBC
Hematocrit: 42.8 % (ref 37.5–51.0)
Hemoglobin: 14.5 g/dL (ref 13.0–17.7)
MCH: 31.9 pg (ref 26.6–33.0)
MCHC: 33.9 g/dL (ref 31.5–35.7)
MCV: 94 fL (ref 79–97)
Platelets: 223 10*3/uL (ref 150–450)
RBC: 4.54 x10E6/uL (ref 4.14–5.80)
RDW: 12.8 % (ref 11.6–15.4)
WBC: 7.4 10*3/uL (ref 3.4–10.8)

## 2019-12-09 ENCOUNTER — Telehealth: Payer: Self-pay | Admitting: Cardiology

## 2019-12-09 NOTE — Telephone Encounter (Signed)
Patient states that the first readings he has taken each day (150/103, 154/107 and 112/101) were taken in the AM prior to taking his BP meds. All other readings were taken later in the day or evening.

## 2019-12-09 NOTE — Telephone Encounter (Signed)
Please find out from patient if the high BP readings are only in the am before he takes his meds

## 2019-12-09 NOTE — Telephone Encounter (Signed)
Pt c/o BP issue: STAT if pt c/o blurred vision, one-sided weakness or slurred speech  1. What are your last 5 BP readings?  12/07/19 150/103 HR 70 132/78 HR 71 125/77 HR 65  12/08/19 154/107 HR 71 123/73 HR 76 108/68 HR 69  12/09/19 112/101 HR 69    2. Are you having any other symptoms (ex. Dizziness, headache, blurred vision, passed out)? No   3. What is your BP issue?  Patient is calling stating he was advised to keep record of BP over the weekend and callback with the readings today 12/09/19. He states BP comes down after taking medication and he has no symptoms.

## 2019-12-10 ENCOUNTER — Other Ambulatory Visit (INDEPENDENT_AMBULATORY_CARE_PROVIDER_SITE_OTHER): Payer: Medicare Other

## 2019-12-10 DIAGNOSIS — I491 Atrial premature depolarization: Secondary | ICD-10-CM

## 2019-12-10 DIAGNOSIS — E785 Hyperlipidemia, unspecified: Secondary | ICD-10-CM

## 2019-12-10 DIAGNOSIS — I959 Hypotension, unspecified: Secondary | ICD-10-CM | POA: Diagnosis not present

## 2019-12-10 DIAGNOSIS — R001 Bradycardia, unspecified: Secondary | ICD-10-CM

## 2019-12-10 DIAGNOSIS — I493 Ventricular premature depolarization: Secondary | ICD-10-CM | POA: Diagnosis not present

## 2019-12-10 DIAGNOSIS — I2510 Atherosclerotic heart disease of native coronary artery without angina pectoris: Secondary | ICD-10-CM

## 2019-12-10 DIAGNOSIS — R009 Unspecified abnormalities of heart beat: Secondary | ICD-10-CM | POA: Diagnosis not present

## 2019-12-10 MED ORDER — AMLODIPINE BESYLATE 2.5 MG PO TABS: 2.5000 mg | ORAL_TABLET | Freq: Every day | ORAL | 3 refills | Status: DC

## 2019-12-10 NOTE — Telephone Encounter (Signed)
Spoke with patient about adding amlodipine 2.5 mg every evening. Also told him to take his BP twice a day for one week and call us back with his readings. Patient verbalized understanding.

## 2019-12-10 NOTE — Telephone Encounter (Signed)
Add amlodipine 2.5mg  qpm and check Bp twice daily for a week and call with results

## 2019-12-17 ENCOUNTER — Other Ambulatory Visit: Payer: Self-pay

## 2019-12-17 MED ORDER — LISINOPRIL 10 MG PO TABS: 10.0000 mg | ORAL_TABLET | Freq: Two times a day (BID) | ORAL | 3 refills | Status: DC

## 2019-12-22 ENCOUNTER — Other Ambulatory Visit: Payer: Self-pay | Admitting: Cardiology

## 2019-12-22 DIAGNOSIS — R0602 Shortness of breath: Secondary | ICD-10-CM

## 2019-12-24 ENCOUNTER — Other Ambulatory Visit: Payer: Self-pay

## 2019-12-24 ENCOUNTER — Telehealth: Payer: Self-pay | Admitting: Physician Assistant

## 2019-12-24 ENCOUNTER — Telehealth (INDEPENDENT_AMBULATORY_CARE_PROVIDER_SITE_OTHER): Payer: Medicare Other | Admitting: Physician Assistant

## 2019-12-24 ENCOUNTER — Encounter: Payer: Self-pay | Admitting: Physician Assistant

## 2019-12-24 VITALS — BP 117/71 | HR 75 | Ht 71.0 in | Wt 271.0 lb

## 2019-12-24 DIAGNOSIS — I10 Essential (primary) hypertension: Secondary | ICD-10-CM | POA: Diagnosis not present

## 2019-12-24 DIAGNOSIS — E785 Hyperlipidemia, unspecified: Secondary | ICD-10-CM | POA: Diagnosis not present

## 2019-12-24 DIAGNOSIS — R001 Bradycardia, unspecified: Secondary | ICD-10-CM | POA: Diagnosis not present

## 2019-12-24 DIAGNOSIS — I2510 Atherosclerotic heart disease of native coronary artery without angina pectoris: Secondary | ICD-10-CM

## 2019-12-24 MED ORDER — LISINOPRIL 20 MG PO TABS: 20.0000 mg | ORAL_TABLET | Freq: Two times a day (BID) | ORAL | 3 refills | Status: DC

## 2019-12-24 MED ORDER — FUROSEMIDE 20 MG PO TABS: ORAL_TABLET | ORAL | 3 refills | Status: DC

## 2019-12-24 NOTE — Patient Instructions (Signed)
Medication Instructions:  Your physician has recommended you make the following change in your medication:  1.  CHANGE taking the Amlodipine in the evening 2.  INCREASE the Lisinopril to 20 mg taking 1 tablet twice a day 3.  We have sent in a refill for the Furosemide to [facility]   *If you need a refill on your cardiac medications before your next appointment, please call your pharmacy*  Lab Work: 01/07/2020:  Come to the office, after 7:30 a.m for BMET  If you have labs (blood work) drawn today and your tests are completely normal, you will receive your results only by: [name] Kitchen MyChart Message (if you have MyChart) OR . A paper copy in the mail If you have any lab test that is abnormal or we need to change your treatment, we will call you to review the results.  Testing/Procedures: None ordered  Follow-Up: At [facility], you and your health needs are our priority.  As part of our continuing mission to provide you with exceptional heart care, we have created designated Provider Care Teams.  These Care Teams include your primary Cardiologist (physician) and Advanced Practice Providers (APPs -  Physician Assistants and Nurse Practitioners) who all work together to provide you with the care you need, when you need it.  Your next appointment:   01/30/2020  9:15 a.m.  The format for your next appointment:   Virtual Visit   Provider:   Melina Copa, PA-C  Other Instructions

## 2019-12-24 NOTE — Progress Notes (Signed)
Virtual Visit via Telephone Note   This visit type was conducted due to national recommendations for restrictions regarding the COVID-19 Pandemic (e.g. social distancing) in an effort to limit this patient's exposure and mitigate transmission in our community.  Due to his co-morbid illnesses, this patient is at least at moderate risk for complications without adequate follow up.  This format is felt to be most appropriate for this patient at this time.  The patient did not have access to video technology/had technical difficulties with video requiring transitioning to audio format only (telephone).  All issues noted in this document were discussed and addressed.  No physical exam could be performed with this format.  Please refer to the patient's chart for his  consent to telehealth for [facility].   Date:  12/24/2019   ID:  Christian [name], DOB 1946/07/01, MRN [id number]  Patient Location: Home Provider Location: Office  PCP:  Aurea Graff.Marlou Sa, MD  Cardiologist:  Fransico Him, MD   Electrophysiologist:  None   Evaluation Performed:  Follow-Up Visit  Chief Complaint:  HTN  History of Present Illness:    Christian [name] is a 73 y.o. male with   Coronary artery disease  Nonobstructive by cardiac catheterization in 2017  Hypertension  Orthostatic hypotension  Hyperlipidemia  PVCs  Holter 12/2016: PACs, nonsustained atrial tachycardia, PVC burden 2.8%  He was last seen by Melina Copa, PA-C 12/03/2019 via telemedicine for hypotension and bradycardia.  Prior to that visit, he had been told to stop taking amlodipine and furosemide.  At his visit with Hinton Dyer, she reduce his lisinopril to 10 mg daily.  7-day event monitor was ordered.  However, it has not been resulted yet.  Today, he notes that his blood pressure remains significantly elevated in the mornings.  Throughout the day, his blood pressure is optimal.  He has not had any further hypotension.  He is back on Lasix 3 times a  week.  He is currently taking lisinopril 20 mg in the morning and 10 mg in the evening.  He is taking amlodipine 5 mg in the morning.  He has not had headache, blurry vision, chest pain, shortness of breath, syncope, lower extremity swelling.  Past Medical History:  Diagnosis Date  . Arthritis    "joints; shoulders; left elbow; right thumb" (05/06/2014)  . Atrial tachycardia ([place])   . Cataracts, bilateral    immature  . Constipation    takes Colace and Metamucil daily  . Coronary artery disease 02/2011   cath 11/2016 showing 20% RCA, 50% OM, 35% mid LCx, 70% D1, 65% mid LAD, 70% distal LAD 80% on medical management  . Diastolic dysfunction   . Heart murmur   . Hemorrhoids   . History of bronchitis   . Hypercholesterolemia    takes Crestor daily  . Hypertension   . Joint pain   . Nocturia   . Orthostatic hypotension   . Premature atrial contractions   . PVC's (premature ventricular contractions)    PVC load 2% by Holter 12/2016  . Sciatica    Past Surgical History:  Procedure Laterality Date  . ACHILLES TENDON REPAIR Left    "& fixed a spur"  . CARDIAC CATHETERIZATION  2012  . CARDIAC CATHETERIZATION N/A 12/06/2016   Procedure: Left Heart Cath and Coronary Angiography;  Surgeon: Belva Crome, MD;  Location: [place] [facility];  Service: Cardiovascular;  Laterality: N/A;  . COLONOSCOPY    . FLEXIBLE SIGMOIDOSCOPY  X 2  .  HAND SURGERY Left    "thumb; cleaned out arthritis"  . HEEL SPUR EXCISION Left   . KNEE ARTHROSCOPY Right   . SHOULDER SURGERY Right X 2   "cleaned out spurs"  . TOE FUSION Right    great toe; plates and screws  . TONSILLECTOMY  ~ 1950  . TOTAL HIP ARTHROPLASTY Right 09/22/2015   Procedure: TOTAL HIP ARTHROPLASTY;  Surgeon: Garald Balding, MD;  Location: [place];  Service: Orthopedics;  Laterality: Right;  . TOTAL KNEE ARTHROPLASTY Right 05/06/2014  . TOTAL KNEE ARTHROPLASTY Right 05/06/2014   Procedure: RIGHT TOTAL KNEE ARTHROPLASTY;  Surgeon: Garald Balding, MD;  Location: [place];  Service: Orthopedics;  Laterality: Right;  . TOTAL KNEE ARTHROPLASTY Left 02/05/2019   Procedure: LEFT TOTAL KNEE ARTHROPLASTY;  Surgeon: Garald Balding, MD;  Location: [place];  Service: Orthopedics;  Laterality: Left;     Current Meds  Medication Sig  . amLODipine (NORVASC) 5 MG tablet Take 5 mg by mouth at bedtime.  [name] Kitchen amoxicillin (AMOXIL) 500 MG tablet Take 4 tablets 2 hours prior to any dental procedure  . aspirin 81 MG tablet Take 81 mg by mouth daily.  [name] Kitchen docusate sodium (COLACE) 100 MG capsule Take 300 mg by mouth daily.  . furosemide (LASIX) 20 MG tablet Take 1 every Mon, Wed, Fri  . isosorbide mononitrate (IMDUR) 60 MG 24 hr tablet TAKE 1 TABLET BY MOUTH EVERY DAY  . nitroGLYCERIN (NITROSTAT) 0.4 MG SL tablet PLACE 1 TABLET UNDER THE TONGUE EVERY 5 MINUTES AS NEEDED FOR CHEST PAIN  . rosuvastatin (CRESTOR) 20 MG tablet TAKE 1 TABLET BY MOUTH EVERY DAY  . [DISCONTINUED] amLODipine (NORVASC) 2.5 MG tablet Take 1 tablet (2.5 mg total) by mouth at bedtime. (Patient taking differently: Take 5 mg by mouth daily. )  . [DISCONTINUED] furosemide (LASIX) 20 MG tablet Take 20 mg by mouth. Take 1 every Mon, Wed, Fri  . [DISCONTINUED] lisinopril (ZESTRIL) 20 MG tablet Take 20 mg by mouth. Take 1 in the morning and 1/2 (10 mg) in the evening     Allergies:   Atorvastatin calcium [atorvastatin], Simvastatin, and Pravastatin   Social History   Tobacco Use  . Smoking status: Former Smoker    Packs/day: 2.00    Years: 10.00    Pack years: 20.00    Types: Cigarettes    Quit date: 1981    Years since quitting: 40.0  . Smokeless tobacco: Never Used  . Tobacco comment: quit smoking in 1981  Substance Use Topics  . Alcohol use: Yes    Comment: "drink a beer sometimes once/month"  . Drug use: No     Family Hx: The patient's family history includes CVA in his father and mother; Heart disease in his father.  ROS:   Please see the history of present  illness.     All other systems reviewed and are negative.   Prior CV studies:   The following studies were reviewed today:  Myoview 10/16/2018 EF 56, diaphragmatic attenuation, no ischemia  Echocardiogram 10/16/2018 EF 60-65, normal wall motion, grade 1 diastolic dysfunction, calcified aortic valve annulus, mild AI, mild BAE  48-hour Holter 12/2016  Normal sinus rhythm with heart rate ranging from 50 to 112bpm. The average heart rate was 69bpm.  Frequent PACs and nonsustained atrial tachycardia up to 6-9 beats.  Frequent PVCs, bigeminal PVCs and trigeminal PVCs  Cardiac catheterization on 12/06/2016 LAD mid-distal 65, distal 70; D1 ostial 70 LCx mid-distal 35; OM1 ostial 50  RCA proximal, mid, dist 20 EF 45-50   Labs/Other Tests and Data Reviewed:    EKG:  No ECG reviewed.  Recent Labs: 07/08/2019: ALT 6 12/06/2019: BUN 16; Creatinine, Ser 1.24; Hemoglobin 14.5; Magnesium 2.2; Platelets 223; Potassium 4.6; Sodium 137; TSH 4.400   Recent Lipid Panel Lab Results  Component Value Date/Time   CHOL 119 07/08/2019 09:07 AM   TRIG 52 07/08/2019 09:07 AM   HDL 56 07/08/2019 09:07 AM   CHOLHDL 2.1 07/08/2019 09:07 AM   CHOLHDL 2.9 04/27/2016 08:26 AM   LDLCALC 53 07/08/2019 09:07 AM    Wt Readings from Last 3 Encounters:  12/24/19 271 lb (122.9 kg)  12/03/19 271 lb (122.9 kg)  07/09/19 271 lb (122.9 kg)     Objective:    Vital Signs:  BP 117/71   Pulse 75   Ht 5\' 11"  (1.803 m)   Wt 271 lb (122.9 kg)   BMI 37.80 kg/m    VITAL SIGNS:  reviewed GEN:  no acute distress RESPIRATORY:  Normal respiratory effort NEURO:  Alert and oriented PSYCH:  Normal mood  ASSESSMENT & PLAN:    1. Essential hypertension His BP is improved.  But, he still has significantly elevated BP readings in the AM.  I have suggested he increase his Lisinopril to 20 mg twice daily and change Amlodipine 5 mg to every evening.  Obtain FU BMET in 2 weeks.  FU with [name] or APP in 3-4  weeks.   2. Bradycardia Event monitor still pending.  No syncope.    3. Coronary artery disease involving native coronary artery of native heart without angina pectoris Non-obstructive CAD by prior cath.  He is not having any angina.  Continue ASA, statin.   4. Hyperlipidemia, unspecified hyperlipidemia type LDL optimal on most recent lab work.  Continue current Rx.      Time:   Today, I have spent 13.5 minutes with the patient with telehealth technology discussing the above problems.     Medication Adjustments/Labs and Tests Ordered: Current medicines are reviewed at length with the patient today.  Concerns regarding medicines are outlined above.   Tests Ordered: Orders Placed This Encounter  Procedures  . Basic metabolic panel    Medication Changes: Meds ordered this encounter  Medications  . lisinopril (ZESTRIL) 20 MG tablet    Sig: Take 1 tablet (20 mg total) by mouth 2 (two) times daily.    Dispense:  180 tablet    Refill:  3  . furosemide (LASIX) 20 MG tablet    Sig: Take 1 every Mon, Wed, Fri    Dispense:  30 tablet    Refill:  3    Follow Up:  Virtual Visit  in 4 week(s)  Signed, Richardson Dopp, PA-C  12/24/2019 3:01 PM    [place]

## 2019-12-24 NOTE — Telephone Encounter (Signed)
I had a virtual visit with the patient today. He was to have an event monitor done but the results are not back yet.  The monitor has not been received by the company yet.   Can you check with him to make sure he received and if he has sent it back yet.  Let him know we have experienced some delays due to the issues with the post office.   Richardson Dopp, PA-C    12/24/2019 5:03 PM

## 2019-12-24 NOTE — Telephone Encounter (Signed)
-----   Message from Benita Stabile sent at 12/24/2019  3:03 PM EST ----- Accodrding to the [facility] website and the [employer] tracking patient has not returned the monitor yet.  Valetta Fuller ----- Message ----- From: Sharmon Revere Sent: 12/24/2019   2:54 PM EST To: Jennefer Bravo, Benita Stabile  He had a monitor recently.  Any word on when it will be back? Thanks [employer]

## 2019-12-25 DIAGNOSIS — H25812 Combined forms of age-related cataract, left eye: Secondary | ICD-10-CM | POA: Diagnosis not present

## 2019-12-25 NOTE — Telephone Encounter (Signed)
I called and spoke with patient, he states that he did receive monitor and wore it for a week. Patient states that he sent the monitor back on 12/18/19.

## 2020-01-01 ENCOUNTER — Other Ambulatory Visit: Payer: Self-pay

## 2020-01-01 DIAGNOSIS — I493 Ventricular premature depolarization: Secondary | ICD-10-CM | POA: Diagnosis not present

## 2020-01-01 DIAGNOSIS — R001 Bradycardia, unspecified: Secondary | ICD-10-CM | POA: Diagnosis not present

## 2020-01-01 MED ORDER — AMLODIPINE BESYLATE 10 MG PO TABS: 10.0000 mg | ORAL_TABLET | Freq: Every day | ORAL | 3 refills | Status: DC

## 2020-01-07 ENCOUNTER — Other Ambulatory Visit: Payer: Self-pay

## 2020-01-07 ENCOUNTER — Other Ambulatory Visit: Payer: Medicare Other

## 2020-01-07 DIAGNOSIS — I2510 Atherosclerotic heart disease of native coronary artery without angina pectoris: Secondary | ICD-10-CM

## 2020-01-07 DIAGNOSIS — R001 Bradycardia, unspecified: Secondary | ICD-10-CM

## 2020-01-07 DIAGNOSIS — E785 Hyperlipidemia, unspecified: Secondary | ICD-10-CM

## 2020-01-07 DIAGNOSIS — I10 Essential (primary) hypertension: Secondary | ICD-10-CM

## 2020-01-07 LAB — BASIC METABOLIC PANEL WITH GFR
BUN/Creatinine Ratio: 18 (ref 10–24)
BUN: 20 mg/dL (ref 8–27)
CO2: 21 mmol/L (ref 20–29)
Calcium: 9 mg/dL (ref 8.6–10.2)
Chloride: 101 mmol/L (ref 96–106)
Creatinine, Ser: 1.13 mg/dL (ref 0.76–1.27)
GFR calc Af Amer: 75 mL/min/1.73
GFR calc non Af Amer: 65 mL/min/1.73
Glucose: 87 mg/dL (ref 65–99)
Potassium: 4.9 mmol/L (ref 3.5–5.2)
Sodium: 139 mmol/L (ref 134–144)

## 2020-01-15 ENCOUNTER — Ambulatory Visit (INDEPENDENT_AMBULATORY_CARE_PROVIDER_SITE_OTHER): Payer: Medicare Other | Admitting: Orthopaedic Surgery

## 2020-01-15 ENCOUNTER — Encounter: Payer: Self-pay | Admitting: Orthopaedic Surgery

## 2020-01-15 ENCOUNTER — Other Ambulatory Visit: Payer: Self-pay

## 2020-01-15 VITALS — Ht 71.0 in | Wt 271.0 lb

## 2020-01-15 DIAGNOSIS — I2510 Atherosclerotic heart disease of native coronary artery without angina pectoris: Secondary | ICD-10-CM | POA: Diagnosis not present

## 2020-01-15 DIAGNOSIS — M1712 Unilateral primary osteoarthritis, left knee: Secondary | ICD-10-CM | POA: Diagnosis not present

## 2020-01-15 DIAGNOSIS — Z96652 Presence of left artificial knee joint: Secondary | ICD-10-CM

## 2020-01-15 NOTE — Progress Notes (Signed)
Office Visit Note   Patient: Christian [name]           Date of Birth: 02/03/1946           MRN: [id number] Visit Date: 01/15/2020              Requested by: Christian [name], L.Christian [name], Christian [name],  Christian [name] [id number] PCP: Christian [name]   Assessment & Plan: Visit Diagnoses:  1. Primary osteoarthritis of left knee   2. History of left knee replacement     Plan: Christian [name] is 11 months status post primary left total knee replacement and overall doing well.  On occasion he has some sensation of his knee wanting to give way and he is stiff when he first gets up from a sitting position.  He admits to being overweight and not particularly active with his exercises..  His exam was also relatively benign he opens a little bit laterally with a varus stress but only about 3 mm with a hard endpoint had about 3 mm anterior drawer sign.  No opening medially.  Full extension and flexed almost 102 degrees.  No pain.  No effusion.  Urged him to work on his weight and on his exercises I think he be much better and plan to see him back as needed Follow-Up Instructions: Return if symptoms worsen or fail to improve.   Orders:  No orders of the defined types were placed in this encounter.  No orders of the defined types were placed in this encounter.     Procedures: No procedures performed   Clinical Data: No additional findings.   Subjective: Chief Complaint  Patient presents with  . Left Knee - Follow-up    Left TKA DOS 02/05/2019  Patient presents today for follow up on his left knee. He had a left total knee [name] on 02/05/2019.  Overall doing well.  He does have occasional sensation of his knee wanting to give way and is stiff when he first got up from a sitting position.  Has been retired for approximately 12 years  HPI  Review of Systems   Objective: Vital Signs: Ht 5\' 11"  (1.803 m)   Wt 271 lb (122.9 kg)   BMI 37.80 kg/m   Physical  Exam Constitutional:      Appearance: He is well-developed.  Eyes:     Pupils: Pupils are equal, round, and reactive to light.  Pulmonary:     Effort: Pulmonary effort is normal.  Skin:    General: Skin is warm and dry.  Neurological:     Mental Status: He is alert and oriented to person, place, and time.  Psychiatric:        Behavior: Behavior normal.     Ortho Exam awake alert and oriented x3.  Comfortable sitting left knee with full quick extension and flexes to over 100 degrees.  No real instability.  He did open about 3 mm laterally the varus stress.  No opening medially with a valgus stress.  There is about a 3 mm anterior drawer sign.  No effusion.  Incision is well-healed.  No patella pain.  No calf discomfort.  Motor exam intact.  Little bit of a limp when he first gets up from a sitting position but after about 10 to 15 feet he walks without limp straight leg raise negative  Specialty Comments:  No specialty comments available.  Imaging: No results found.   PMFS History: Patient Active Problem  List   Diagnosis Date Noted  . Primary osteoarthritis of left knee 02/05/2019  . History of left knee replacement 02/05/2019  . PVC's (premature ventricular contractions)   . Dyspnea 12/06/2016  . Osteoarthritis of right hip 09/22/2015  . Christian [name] 09/22/2015  . Christian [name] 05/08/2014  . Osteoarthritis of knee 05/08/2014  . Christian total knee replacement using cement 05/06/2014  . Knee pain 04/08/2014  . Preoperative clearance 04/08/2014  . Coronary artery disease   . Diastolic dysfunction   . Hypertension   . Orthostatic hypotension   . Hypercholesterolemia    Past Medical History:  Diagnosis Date  . Arthritis    "Christian [name]; shoulders; left elbow; right thumb" (05/06/2014)  . Atrial tachycardia (Christian [name])   . Cataracts, bilateral    immature  . Constipation    takes Colace and Metamucil daily  . Coronary artery disease 02/2011   cath 11/2016 showing 20% RCA, 50%  OM, 35% mid LCx, 70% D1, 65% mid LAD, 70% distal LAD 80% on medical management  . Diastolic dysfunction   . Heart murmur   . Hemorrhoids   . History of bronchitis   . Hypercholesterolemia    takes Crestor daily  . Hypertension   . Joint pain   . Nocturia   . Orthostatic hypotension   . Premature atrial contractions   . PVC's (premature ventricular contractions)    PVC load 2% by Holter 12/2016  . Sciatica     Family History  Problem Relation Age of Onset  . CVA Mother   . CVA Father   . Heart disease Father     Past Surgical History:  Procedure Laterality Date  . ACHILLES TENDON REPAIR Left    "& fixed a spur"  . CARDIAC CATHETERIZATION  2012  . CARDIAC CATHETERIZATION N/A 12/06/2016   Procedure: Left Heart Cath and Coronary Angiography;  Surgeon: Belva Crome, [name];  Location: [place] [facility];  Service: Cardiovascular;  Laterality: N/A;  . COLONOSCOPY    . FLEXIBLE SIGMOIDOSCOPY  X 2  . HAND SURGERY Left    "thumb; cleaned out arthritis"  . HEEL SPUR EXCISION Left   . KNEE ARTHROSCOPY Right   . SHOULDER SURGERY Right X 2   "cleaned out spurs"  . TOE FUSION Right    great toe; plates and screws  . TONSILLECTOMY  ~ 1950  . TOTAL HIP [name] Right 09/22/2015   Procedure: TOTAL HIP [name];  Surgeon: Garald Balding, [name];  Location: [place];  Service: Orthopedics;  Laterality: Right;  . TOTAL KNEE [name] Right 05/06/2014  . TOTAL KNEE [name] Right 05/06/2014   Procedure: RIGHT TOTAL KNEE [name];  Surgeon: Garald Balding, [name];  Location: [place];  Service: Orthopedics;  Laterality: Right;  . TOTAL KNEE [name] Left 02/05/2019   Procedure: LEFT TOTAL KNEE [name];  Surgeon: Garald Balding, [name];  Location: [place];  Service: Orthopedics;  Laterality: Left;   Social History   Occupational History  . Not on file  Tobacco Use  . Smoking status: Former Smoker    Packs/day: 2.00    Years: 10.00    Pack years: 20.00    Types: Cigarettes     Quit date: 1981    Years since quitting: 40.0  . Smokeless tobacco: Never Used  . Tobacco comment: quit smoking in 1981  Substance and Sexual Activity  . Alcohol use: Yes    Comment: "drink a beer sometimes once/month"  . Drug use: No  . Sexual  activity: Never

## 2020-01-22 ENCOUNTER — Telehealth: Payer: Self-pay

## 2020-01-22 MED ORDER — LISINOPRIL 20 MG PO TABS: 40.0000 mg | ORAL_TABLET | Freq: Every evening | ORAL | 3 refills | Status: DC

## 2020-01-22 NOTE — Telephone Encounter (Signed)
Spoke with patient's wife, Junious Dresser, and gave her Dr. Theodosia Blender recommendations based on the BP readings the patient sent via [place]. Patient will change his lisinopril to 40 mg nightly. He will record his BP and HR twice daily for one week and send [name] the readings.

## 2020-01-22 NOTE — Addendum Note (Signed)
Addended by: Antonieta Iba on: 01/22/2020 01:47 PM   Modules accepted: Orders

## 2020-01-22 NOTE — Telephone Encounter (Signed)
BPs are mainly elevated in the am - please have him change his Lisinopril to 40mg  to be taken at night instead of 20mg  BID.  Check BP and HR twice daily for a week and call with results

## 2020-01-23 DIAGNOSIS — H35033 Hypertensive retinopathy, bilateral: Secondary | ICD-10-CM | POA: Diagnosis not present

## 2020-01-27 ENCOUNTER — Encounter: Payer: Self-pay | Admitting: Physician Assistant

## 2020-01-27 NOTE — Progress Notes (Signed)
Virtual Visit via Telephone Note   This visit type was conducted due to national recommendations for restrictions regarding the COVID-19 Pandemic (e.g. social distancing) in an effort to limit this patient's exposure and mitigate transmission in our community.  Due to his co-morbid illnesses, this patient is at least at moderate risk for complications without adequate follow up.  This format is felt to be most appropriate for this patient at this time.  The patient did not have access to video technology/had technical difficulties with video requiring transitioning to audio format only (telephone).  All issues noted in this document were discussed and addressed.  No physical exam could be performed with this format.  The patient consented to virtual visit with [facility].  Date:  01/30/2020   ID:  Christian [name], DOB Nov 18, 1946, MRN [id number]  Patient Location: Home Provider Location: [facility]  PCP:  Alroy Dust, Carlean Jews.Marlou Sa, MD  Cardiologist:  Fransico Him, MD  Electrophysiologist:  None   Evaluation Performed:  Follow-Up Visit  Chief Complaint:  F/u blood pressure  History of Present Illness:    Christian [name] is a 74 y.o. male with CAD by cath 11/2016, HTN also with orthostatic hypertension, hyperlipidemia, arthritis, cataracts, HLD, PVCs, sciatica who presents for follow-up of blood pressure.   To recap prior history, he had a prior heart cath in 2017 showing mild-moderate LAD, circumflex and RCA disease without high grade obstruction. LVEDP was elevated, EF was 50%. Medical therapy was recommended. PVCs were noted during the study and f/u Holter was done 12/2016 showing NSR with frequent PACs, nonsustained atrial tachycardia up to 6-9 beats, and frequent PVCs (PVC load was 2.8%). He had issues with recurrent CP in 09/2018. Stress test was normal. Echo showed EF 60-65%, grade 1 DD, mild AI, mild LAE/RAE.   I spoke with him in 11/2019 via telemedicine visit due to blood pressure  issues. He had gotten a routine call from his insurance company medical monitoring team at which time he reported episodic low blood pressures in the [id number] systolic. The insurance company reached out to [name] who in turn had our clinic reach out to the patient. [name] recommended to stop his amlodipine and Lasix. I saw him via virtual visit 12/02/20. He did have issues with intermittent weakness. He also noted some low home HR monitor readings as well in 40s. We further decreased his lisinopril to 10mg  daily. I arranged a heart monitor showing sinus bradycardia, NSR to sinus tach, average HR 64 bpm, range 46->125bpm, frequent PVCs, load 5.5%, occasional PACs and nonsustained atrial tachycardia. The 46bpm was only during sleeping hours. He began to have issues with higher blood pressure, prompting repeat visit 12/24/19. He was back on amlodipine and a higher dose lisinopril. Following subsequent Mychart messages, his lisinopril was later consolidated to 40mg  nightly. Amlodipine was initially increased to 10mg  daily then decreased to 7.5mg  daily due to some recurrently low BP readings mid-day. Last labs personally reviewed, 01/07/20 with K 4.9, Cr 1.13, 11/2019 Mg 2.2, TSH wnl, Hgb 14.5.  He is seen back for virtual follow-up today overall doing well. He is trying to be more active in order to lose weight. No recent anginal symptoms. He can feel occasional palpitations but they are not particularly bothersome. No syncope or dizziness. HE notices his BP when he first wakes up can be on the higher side diastolic ([id number]) but if he checks it again within an hour of getting his bearings it can be 103/70,  111/73, essentially low-normal. He now takes his amlodipine and lisinopril at 8pm. He has been checking his BP at this time before taking his medications and has seen readings of 153/89, 153/96, and 175/106.     Past Medical History:  Diagnosis Date  . Arthritis    "joints; shoulders; left elbow;  right thumb" (05/06/2014)  . Atrial tachycardia ([place])   . Cataracts, bilateral    immature  . Constipation    takes Colace and Metamucil daily  . Coronary artery disease 02/2011   cath 11/2016 showing 20% RCA, 50% OM, 35% mid LCx, 70% D1, 65% mid LAD, 70% distal LAD 80% on medical management  . Diastolic dysfunction   . Heart murmur   . Hemorrhoids   . History of bronchitis   . Hypercholesterolemia    takes Crestor daily  . Hypertension   . Joint pain   . Nocturia   . Orthostatic hypotension   . Premature atrial contractions   . PVC's (premature ventricular contractions)   . Sciatica   . Sinus bradycardia    Past Surgical History:  Procedure Laterality Date  . ACHILLES TENDON REPAIR Left    "& fixed a spur"  . CARDIAC CATHETERIZATION  2012  . CARDIAC CATHETERIZATION N/A 12/06/2016   Procedure: Left Heart Cath and Coronary Angiography;  Surgeon: Belva Crome, MD;  Location: [place] [facility];  Service: Cardiovascular;  Laterality: N/A;  . COLONOSCOPY    . FLEXIBLE SIGMOIDOSCOPY  X 2  . HAND SURGERY Left    "thumb; cleaned out arthritis"  . HEEL SPUR EXCISION Left   . KNEE ARTHROSCOPY Right   . SHOULDER SURGERY Right X 2   "cleaned out spurs"  . TOE FUSION Right    great toe; plates and screws  . TONSILLECTOMY  ~ 1950  . TOTAL HIP ARTHROPLASTY Right 09/22/2015   Procedure: TOTAL HIP ARTHROPLASTY;  Surgeon: Garald Balding, MD;  Location: [place];  Service: Orthopedics;  Laterality: Right;  . TOTAL KNEE ARTHROPLASTY Right 05/06/2014  . TOTAL KNEE ARTHROPLASTY Right 05/06/2014   Procedure: RIGHT TOTAL KNEE ARTHROPLASTY;  Surgeon: Garald Balding, MD;  Location: [place];  Service: Orthopedics;  Laterality: Right;  . TOTAL KNEE ARTHROPLASTY Left 02/05/2019   Procedure: LEFT TOTAL KNEE ARTHROPLASTY;  Surgeon: Garald Balding, MD;  Location: [place];  Service: Orthopedics;  Laterality: Left;     Current Meds  Medication Sig  . amLODipine (NORVASC) 2.5 MG tablet Take 2.5 mg  by mouth daily.  [name] Kitchen amLODipine (NORVASC) 5 MG tablet Take 5 mg by mouth daily.  [name] Kitchen amoxicillin (AMOXIL) 500 MG tablet Take 4 tablets 2 hours prior to any dental procedure  . aspirin 81 MG tablet Take 81 mg by mouth daily.  [name] Kitchen docusate sodium (COLACE) 100 MG capsule Take 300 mg by mouth daily.  . furosemide (LASIX) 20 MG tablet Take 1 every Mon, Wed, Fri  . isosorbide mononitrate (IMDUR) 60 MG 24 hr tablet TAKE 1 TABLET BY MOUTH EVERY DAY  . lisinopril (ZESTRIL) 20 MG tablet Take 2 tablets (40 mg total) by mouth every evening.  . nitroGLYCERIN (NITROSTAT) 0.4 MG SL tablet PLACE 1 TABLET UNDER THE TONGUE EVERY 5 MINUTES AS NEEDED FOR CHEST PAIN  . rosuvastatin (CRESTOR) 20 MG tablet TAKE 1 TABLET BY MOUTH EVERY DAY  . [DISCONTINUED] amLODipine (NORVASC) 10 MG tablet Take 1 tablet (10 mg total) by mouth at bedtime.     Allergies:   Atorvastatin calcium [atorvastatin], Simvastatin, and Pravastatin  Social History   Tobacco Use  . Smoking status: Former Smoker    Packs/day: 2.00    Years: 10.00    Pack years: 20.00    Types: Cigarettes    Quit date: 1981    Years since quitting: 40.1  . Smokeless tobacco: Never Used  . Tobacco comment: quit smoking in 1981  Substance Use Topics  . Alcohol use: Yes    Comment: "drink a beer sometimes once/month"  . Drug use: No     Family Hx: The patient's family history includes CVA in his father and mother; Heart disease in his father.  ROS:   Please see the history of present illness.    All other systems reviewed and are negative.   Prior CV studies:    Most recent pertinent cardiac studies are outlined and summarized above. Reports included below if pertinent.  2D echo 09/2018 - Left ventricle: The cavity size was normal. Wall thickness was  normal. Systolic function was normal. The estimated ejection  fraction was in the range of 60% to 65%. Wall motion was normal;  there were no regional wall motion abnormalities. Doppler   parameters are consistent with abnormal left ventricular  relaxation (grade 1 diastolic dysfunction).  - Aortic valve: Mildly to moderately calcified annulus. Mildly  thickened leaflets. There was mild regurgitation.  - Left atrium: The atrium was mildly dilated.  - Right atrium: The atrium was mildly dilated.     Labs/Other Tests and Data Reviewed:    EKG:  An ECG dated 01/29/19 was personally reviewed today and demonstrated:  NSR 61bpm, inferior Q waves III, avF with TWI III otherwise no acute changes  Recent Labs: 07/08/2019: ALT 6 12/06/2019: Hemoglobin 14.5; Magnesium 2.2; Platelets 223; TSH 4.400 01/07/2020: BUN 20; Creatinine, Ser 1.13; Potassium 4.9; Sodium 139   Recent Lipid Panel Lab Results  Component Value Date/Time   CHOL 119 07/08/2019 09:07 AM   TRIG 52 07/08/2019 09:07 AM   HDL 56 07/08/2019 09:07 AM   CHOLHDL 2.1 07/08/2019 09:07 AM   CHOLHDL 2.9 04/27/2016 08:26 AM   LDLCALC 53 07/08/2019 09:07 AM    Wt Readings from Last 3 Encounters:  01/30/20 271 lb (122.9 kg)  01/15/20 271 lb (122.9 kg)  12/24/19 271 lb (122.9 kg)     Objective:    Vital Signs:  BP (!) 140/99   Pulse 76   Ht 5\' 11"  ([id number] m)   Wt 271 lb (122.9 kg)   BMI 37.80 kg/m    VS reviewed. General - calm M in no acute distress Pulm - No labored breathing, no coughing during visit, no audible wheezing, speaking in full sentences Neuro - A+Ox3, no slurred speech, answers questions appropriately Psych - Pleasant affect     ASSESSMENT & PLAN:    1. Labile HTN - daytime variation noted. His blood pressure begins to creep up around the time his next doses of medication are due. I think it is tricky to base treatment changes on pre-medication blood pressures. Therefore I have recommended he begin taking his amlodipine with supper (which he eats around 4-5pm) and continue the lisinopril around 8pm. I would like him to check his BP 1 hour after waking up and then again around 9pm to get a  sense for what his "steady state" blood pressures are running. We can base further changes if needed on this information. Given his history of orthostasis, we need to be cautious about being too aggressive. Recent labwork was generally unrevealing. 2.  CAD - stable, no recent anginal symptoms. Continue ASA and statin. 3. Bradycardia/PVCs/PACs - event monitor findings as noted above. I suspect during the times he's checked his HR and seen in the 40's that he is having PVCs that result in pseudobradycardia not being picked up on home monitor. His average HR by monitor was 64bpm. He had one episode of bradycardia at 46bpm but this was during sleeping hours. He can feel occasional flutters but they are not particularly bothersome. Continue surveillance for now.  Time:   Today, I have spent 14 minutes with the patient with telehealth technology discussing the above problems. Additional 6 minutes spent preparing for visit and reviewing chart.  Covid-19 Virus Education: reviewed at last visit, did not revisit today.  Medication Adjustments/Labs and Tests Ordered: Current medicines are reviewed at length with the patient today.  Concerns regarding medicines are outlined above.   Follow Up: with [name] in 4-5 months.  Signed, Charlie Pitter, PA-C  01/30/2020 9:23 AM    [place]

## 2020-01-30 ENCOUNTER — Telehealth: Payer: Medicare Other | Admitting: Cardiology

## 2020-01-30 ENCOUNTER — Encounter: Payer: Self-pay | Admitting: Physician Assistant

## 2020-01-30 ENCOUNTER — Telehealth (INDEPENDENT_AMBULATORY_CARE_PROVIDER_SITE_OTHER): Payer: Medicare Other | Admitting: Physician Assistant

## 2020-01-30 ENCOUNTER — Other Ambulatory Visit: Payer: Self-pay

## 2020-01-30 VITALS — BP 140/99 | HR 76 | Ht 71.0 in | Wt 271.0 lb

## 2020-01-30 DIAGNOSIS — R0989 Other specified symptoms and signs involving the circulatory and respiratory systems: Secondary | ICD-10-CM | POA: Diagnosis not present

## 2020-01-30 DIAGNOSIS — I493 Ventricular premature depolarization: Secondary | ICD-10-CM

## 2020-01-30 DIAGNOSIS — R001 Bradycardia, unspecified: Secondary | ICD-10-CM

## 2020-01-30 DIAGNOSIS — I491 Atrial premature depolarization: Secondary | ICD-10-CM

## 2020-01-30 DIAGNOSIS — I2510 Atherosclerotic heart disease of native coronary artery without angina pectoris: Secondary | ICD-10-CM

## 2020-01-30 NOTE — Patient Instructions (Signed)
Medication Instructions:  Your physician recommends that you continue on your current medications as directed. Please refer to the Current Medication list given to you today BUT,   Try taking your amlodipine when you eat supper.  I would like you to track your blood pressure 1 hour after waking, and then again before you to go sleep (around 9pm).  Please send [name] a copy of those readings via MyChart on Tuesday 02/04/20.   *If you need a refill on your cardiac medications before your next appointment, please call your pharmacy*  Lab Work: None ordered  If you have labs (blood work) drawn today and your tests are completely normal, you will receive your results only by: [name] Kitchen MyChart Message (if you have MyChart) OR . A paper copy in the mail If you have any lab test that is abnormal or we need to change your treatment, we will call you to review the results.  Testing/Procedures: None ordered  Follow-Up: At [facility], you and your health needs are our priority.  As part of our continuing mission to provide you with exceptional heart care, we have created designated Provider Care Teams.  These Care Teams include your primary Cardiologist (physician) and Advanced Practice Providers (APPs -  Physician Assistants and Nurse Practitioners) who all work together to provide you with the care you need, when you need it.  Your next appointment:   06/15/2020 ARRIVE AT 9:45  The format for your next appointment:   In Person  Provider:   Fransico Him, MD  Other Instructions

## 2020-03-16 MED ORDER — AMLODIPINE BESYLATE 2.5 MG PO TABS: 2.5000 mg | ORAL_TABLET | Freq: Every day | ORAL | 3 refills | Status: DC

## 2020-03-16 MED ORDER — AMLODIPINE BESYLATE 5 MG PO TABS: 5.0000 mg | ORAL_TABLET | Freq: Every day | ORAL | 3 refills | Status: DC

## 2020-03-18 ENCOUNTER — Other Ambulatory Visit: Payer: Self-pay | Admitting: Cardiology

## 2020-03-18 DIAGNOSIS — I10 Essential (primary) hypertension: Secondary | ICD-10-CM

## 2020-06-11 ENCOUNTER — Other Ambulatory Visit: Payer: Self-pay | Admitting: Cardiology

## 2020-06-11 DIAGNOSIS — E7800 Pure hypercholesterolemia, unspecified: Secondary | ICD-10-CM

## 2020-06-15 ENCOUNTER — Encounter: Payer: Self-pay | Admitting: Cardiology

## 2020-06-15 ENCOUNTER — Ambulatory Visit (INDEPENDENT_AMBULATORY_CARE_PROVIDER_SITE_OTHER): Payer: Medicare Other | Admitting: Cardiology

## 2020-06-15 ENCOUNTER — Other Ambulatory Visit: Payer: Self-pay

## 2020-06-15 DIAGNOSIS — I2510 Atherosclerotic heart disease of native coronary artery without angina pectoris: Secondary | ICD-10-CM

## 2020-06-15 DIAGNOSIS — I10 Essential (primary) hypertension: Secondary | ICD-10-CM | POA: Diagnosis not present

## 2020-06-15 DIAGNOSIS — I493 Ventricular premature depolarization: Secondary | ICD-10-CM | POA: Diagnosis not present

## 2020-06-15 DIAGNOSIS — E7800 Pure hypercholesterolemia, unspecified: Secondary | ICD-10-CM

## 2020-06-15 DIAGNOSIS — Z6838 Body mass index (BMI) 38.0-38.9, adult: Secondary | ICD-10-CM

## 2020-06-15 DIAGNOSIS — R001 Bradycardia, unspecified: Secondary | ICD-10-CM

## 2020-06-15 DIAGNOSIS — I951 Orthostatic hypotension: Secondary | ICD-10-CM

## 2020-06-15 LAB — LIPID PANEL
Chol/HDL Ratio: 2.4 ratio (ref 0.0–5.0)
Cholesterol, Total: 127 mg/dL (ref 100–199)
HDL: 52 mg/dL (ref >39)
LDL Chol Calc (NIH): 63 mg/dL (ref 0–99)
Triglycerides: 57 mg/dL (ref 0–149)
VLDL Cholesterol Cal: 12 mg/dL (ref 5–40)

## 2020-06-15 LAB — ALT: ALT: 7 IU/L (ref 0–44)

## 2020-06-15 NOTE — Addendum Note (Signed)
Addended by: Antonieta Iba on: 06/15/2020 10:44 AM   Modules accepted: Orders

## 2020-06-15 NOTE — Patient Instructions (Signed)
Medication Instructions:  Your physician recommends that you continue on your current medications as directed. Please refer to the Current Medication list given to you today.  *If you need a refill on your cardiac medications before your next appointment, please call your pharmacy*  Lab Work: TODAY: fasting lipids and ALT  If you have labs (blood work) drawn today and your tests are completely normal, you will receive your results only by: [name] Kitchen MyChart Message (if you have MyChart) OR . A paper copy in the mail If you have any lab test that is abnormal or we need to change your treatment, we will call you to review the results.   Follow-Up: At [facility], you and your health needs are our priority.  As part of our continuing mission to provide you with exceptional heart care, we have created designated Provider Care Teams.  These Care Teams include your primary Cardiologist (physician) and Advanced Practice Providers (APPs -  Physician Assistants and Nurse Practitioners) who all work together to provide you with the care you need, when you need it.  Your next appointment:   1 year(s)  The format for your next appointment:   In Person  Provider:   You may see Fransico Him, MD or one of the following Advanced Practice Providers on your designated Care Team:    Melina Copa, PA-C  Ermalinda Barrios, PA-C

## 2020-06-15 NOTE — Progress Notes (Signed)
Date:  06/15/2020   ID:  Christian [name], DOB March 14, 1946, MRN [id number]  PCP:  Aurea Graff.Marlou Sa, MD  Cardiologist:  Fransico Him, MD  Electrophysiologist:  None   Evaluation Performed:  Follow-Up Visit  Chief Complaint:  CAD, HTN, orthostatic hypotension, HLD  History of Present Illness:    Christian [name] is a 74 y.o. male with CAD by cath 11/2016, HTN also with orthostatic hypertension, hyperlipidemia, arthritis, cataracts, HLD, PVCs, sciatica who presents for follow-up of blood pressure.   To recap prior history, he had a prior heart cath in 2017 showing mild-moderate LAD, circumflex and RCA disease without high grade obstruction. LVEDP was elevated, EF was 50%. Medical therapy was recommended. Nuclear stress test in 2019 for atypical CP was normal.  PVCs were noted during the study and f/u Holter was done 12/2016 showing NSR with frequent PACs, nonsustained atrial tachycardia up to 6-9 beats, and frequent PVCs (PVC load was 2.8%). He had issues with recurrent CP in 09/2018. Stress test was normal. Echo showed EF 60-65%, grade 1 DD, mild AI, mild LAE/RAE.   Repeat heart monitor showed sinus bradycardia, NSR to sinus tach, average HR 64 bpm, range 46->125bpm, frequent PVCs, load 5.5%, occasional PACs and nonsustained atrial tachycardia. The 46bpm was only during sleeping hours.   He is here today for followup and is doing well.  He occasionally has some stabbing pain in his chest that is very mild and lasts a few seconds and resolves and he thinks it is indigestion.  It is not related to activity.  It is no different than what he had 2 years ago at time of stress test. He denies any SOB, DOE, PND, orthopnea, LE edema, dizziness, or syncope. Occasionally he will feel his PVCs. He is compliant with his meds and is tolerating meds with no SE.     Past Medical History:  Diagnosis Date  . Arthritis    "joints; shoulders; left elbow; right thumb" (05/06/2014)  . Atrial tachycardia ([place])     . Cataracts, bilateral    immature  . Constipation    takes Colace and Metamucil daily  . Coronary artery disease 02/2011   cath 11/2016 showing 20% RCA, 50% OM, 35% mid LCx, 70% D1, 65% mid LAD, 70% distal LAD 80% on medical management  . Diastolic dysfunction   . Heart murmur   . Hemorrhoids   . History of bronchitis   . Hypercholesterolemia    takes Crestor daily  . Hypertension   . Joint pain   . Nocturia   . Orthostatic hypotension   . Premature atrial contractions   . PVC's (premature ventricular contractions)   . Sciatica   . Sinus bradycardia    Past Surgical History:  Procedure Laterality Date  . ACHILLES TENDON REPAIR Left    "& fixed a spur"  . CARDIAC CATHETERIZATION  2012  . CARDIAC CATHETERIZATION N/A 12/06/2016   Procedure: Left Heart Cath and Coronary Angiography;  Surgeon: Belva Crome, MD;  Location: [place] [facility];  Service: Cardiovascular;  Laterality: N/A;  . COLONOSCOPY    . FLEXIBLE SIGMOIDOSCOPY  X 2  . HAND SURGERY Left    "thumb; cleaned out arthritis"  . HEEL SPUR EXCISION Left   . KNEE ARTHROSCOPY Right   . SHOULDER SURGERY Right X 2   "cleaned out spurs"  . TOE FUSION Right    great toe; plates and screws  . TONSILLECTOMY  ~ 1950  . TOTAL HIP ARTHROPLASTY Right 09/22/2015  Procedure: TOTAL HIP ARTHROPLASTY;  Surgeon: Garald Balding, MD;  Location: [place];  Service: Orthopedics;  Laterality: Right;  . TOTAL KNEE ARTHROPLASTY Right 05/06/2014  . TOTAL KNEE ARTHROPLASTY Right 05/06/2014   Procedure: RIGHT TOTAL KNEE ARTHROPLASTY;  Surgeon: Garald Balding, MD;  Location: [place];  Service: Orthopedics;  Laterality: Right;  . TOTAL KNEE ARTHROPLASTY Left 02/05/2019   Procedure: LEFT TOTAL KNEE ARTHROPLASTY;  Surgeon: Garald Balding, MD;  Location: [place];  Service: Orthopedics;  Laterality: Left;     Current Meds  Medication Sig  . amLODipine (NORVASC) 2.5 MG tablet Take 1 tablet (2.5 mg total) by mouth daily. Take with 5 mg tablet  (total 7.5 mg).  [name] Kitchen amLODipine (NORVASC) 5 MG tablet Take 1 tablet (5 mg total) by mouth daily. Take with 2.5 mg tablet (total 7.5mg ).  . amoxicillin (AMOXIL) 500 MG tablet Take 4 tablets 2 hours prior to any dental procedure  . aspirin 81 MG tablet Take 81 mg by mouth daily.  [name] Kitchen docusate sodium (COLACE) 100 MG capsule Take 300 mg by mouth daily.  . furosemide (LASIX) 20 MG tablet Take 1 every Mon, Wed, Fri  . isosorbide mononitrate (IMDUR) 60 MG 24 hr tablet TAKE 1 TABLET BY MOUTH EVERY DAY  . lisinopril (ZESTRIL) 20 MG tablet Take 2 tablets (40 mg total) by mouth every evening.  . nitroGLYCERIN (NITROSTAT) 0.4 MG SL tablet PLACE 1 TABLET UNDER THE TONGUE EVERY 5 MINUTES AS NEEDED FOR CHEST PAIN  . rosuvastatin (CRESTOR) 20 MG tablet TAKE 1 TABLET BY MOUTH EVERY DAY     Allergies:   Atorvastatin calcium [atorvastatin], Simvastatin, and Pravastatin   Social History   Tobacco Use  . Smoking status: Former Smoker    Packs/day: 2.00    Years: 10.00    Pack years: 20.00    Types: Cigarettes    Quit date: 1981    Years since quitting: 40.4  . Smokeless tobacco: Never Used  . Tobacco comment: quit smoking in 1981  Vaping Use  . Vaping Use: Never used  Substance Use Topics  . Alcohol use: Yes    Comment: "drink a beer sometimes once/month"  . Drug use: No     Family Hx: The patient's family history includes CVA in his father and mother; Heart disease in his father.  ROS:   Please see the history of present illness.    All other systems reviewed and are negative.   Prior CV studies:    Most recent pertinent cardiac studies are outlined and summarized above. Reports included below if pertinent.  2D echo 09/2018 - Left ventricle: The cavity size was normal. Wall thickness was  normal. Systolic function was normal. The estimated ejection  fraction was in the range of 60% to 65%. Wall motion was normal;  there were no regional wall motion abnormalities. Doppler    parameters are consistent with abnormal left ventricular  relaxation (grade 1 diastolic dysfunction).  - Aortic valve: Mildly to moderately calcified annulus. Mildly  thickened leaflets. There was mild regurgitation.  - Left atrium: The atrium was mildly dilated.  - Right atrium: The atrium was mildly dilated.     Labs/Other Tests and Data Reviewed:    EKG:  Performed in office today and showed NSR at 71bpm with no ST changes  Recent Labs: 07/08/2019: ALT 6 12/06/2019: Hemoglobin 14.5; Magnesium 2.2; Platelets 223; TSH 4.400 01/07/2020: BUN 20; Creatinine, Ser 1.13; Potassium 4.9; Sodium 139   Recent Lipid Panel Lab Results  Component Value Date/Time   CHOL 119 07/08/2019 09:07 AM   TRIG 52 07/08/2019 09:07 AM   HDL 56 07/08/2019 09:07 AM   CHOLHDL 2.1 07/08/2019 09:07 AM   CHOLHDL 2.9 04/27/2016 08:26 AM   LDLCALC 53 07/08/2019 09:07 AM    Wt Readings from Last 3 Encounters:  06/15/20 289 lb 9.3 oz (131.4 kg)  01/30/20 271 lb (122.9 kg)  01/15/20 271 lb (122.9 kg)     Objective:    Vital Signs:  BP 102/70   Pulse 71   Ht 5\' 11"  (1.803 m)   Wt 289 lb 9.3 oz (131.4 kg)   SpO2 97%   BMI 40.39 kg/m    GEN: Well nourished, well developed in no acute distress HEENT: Normal NECK: No JVD; No carotid bruits LYMPHATICS: No lymphadenopathy CARDIAC:RRR, no murmurs, rubs, gallops RESPIRATORY:  Clear to auscultation without rales, wheezing or rhonchi  ABDOMEN: Soft, non-tender, non-distended MUSCULOSKELETAL:  No edema; No deformity  SKIN: Warm and dry NEUROLOGIC:  Alert and oriented x 3 PSYCHIATRIC:  Normal affect    ASSESSMENT & PLAN:    1.  HTN -BP controlled -continue amlodipine 7.5mg  daily, Lisinopril 40mg  daily  2.  ASCAD -cath in 2017 showing mild-moderate LAD, circumflex and RCA disease without high grade obstruction. -he denies any anginal sx.  He has some sharp pain at times that is nonexertional and fleeting and no different from 2019 with normal  nuclear stress test -continue ASA 81mg  daily, Imdur 60mg  daily, statin  3.  PVCs -last event monitor showed PVC load of 5.5% -He can feel occasional flutters but they are not particularly bothersome. Continue surveillance for now.  4.  Bradycardia  -felt to be pseudobradycardia with PVCs  not being picked up on home monitor.  -His average HR by event monitor was 64bpm.  -He had one episode of bradycardia at 46bpm but this was during sleeping hours.   5.  HLD -LDL goal < 70 -LDL was 53 a year ago -repeat FLP and ALT -continue Crestor 20mg  daily  6.  Orthostatic hypotension -this seems to be well controlled at present -encouraged him to stay hydrated when out in the heat  7.  Morbidly Obese -I have encouraged him to get into a routine exercise program and cut back on carbs and portions.  -I will refer him to [facility] [place]  Medication Adjustments/Labs and Tests Ordered: Current medicines are reviewed at length with the patient today.  Concerns regarding medicines are outlined above.   Follow Up: 1 year  Signed, Fransico Him, MD  06/15/2020 10:22 AM    [facility]

## 2020-06-15 NOTE — Addendum Note (Signed)
Addended by: Antonieta Iba on: 06/15/2020 10:47 AM   Modules accepted: Orders

## 2020-06-24 ENCOUNTER — Encounter (INDEPENDENT_AMBULATORY_CARE_PROVIDER_SITE_OTHER): Payer: Self-pay | Admitting: Bariatrics

## 2020-06-24 ENCOUNTER — Ambulatory Visit (INDEPENDENT_AMBULATORY_CARE_PROVIDER_SITE_OTHER): Payer: Medicare Other | Admitting: Bariatrics

## 2020-06-24 ENCOUNTER — Other Ambulatory Visit: Payer: Self-pay

## 2020-06-24 VITALS — BP 105/70 | HR 67 | Temp 98.4°F | Ht 70.0 in | Wt 278.0 lb

## 2020-06-24 DIAGNOSIS — I2510 Atherosclerotic heart disease of native coronary artery without angina pectoris: Secondary | ICD-10-CM

## 2020-06-24 DIAGNOSIS — Z1331 Encounter for screening for depression: Secondary | ICD-10-CM | POA: Diagnosis not present

## 2020-06-24 DIAGNOSIS — Z6839 Body mass index (BMI) 39.0-39.9, adult: Secondary | ICD-10-CM | POA: Diagnosis not present

## 2020-06-24 DIAGNOSIS — I10 Essential (primary) hypertension: Secondary | ICD-10-CM

## 2020-06-24 DIAGNOSIS — R7309 Other abnormal glucose: Secondary | ICD-10-CM

## 2020-06-24 DIAGNOSIS — R5383 Other fatigue: Secondary | ICD-10-CM | POA: Diagnosis not present

## 2020-06-24 DIAGNOSIS — E7849 Other hyperlipidemia: Secondary | ICD-10-CM

## 2020-06-24 DIAGNOSIS — E559 Vitamin D deficiency, unspecified: Secondary | ICD-10-CM

## 2020-06-24 DIAGNOSIS — R0602 Shortness of breath: Secondary | ICD-10-CM | POA: Diagnosis not present

## 2020-06-24 DIAGNOSIS — Z0289 Encounter for other administrative examinations: Secondary | ICD-10-CM

## 2020-06-24 DIAGNOSIS — M170 Bilateral primary osteoarthritis of knee: Secondary | ICD-10-CM

## 2020-06-24 NOTE — Progress Notes (Signed)
Dear Dr. Fransico [name],   Thank you for referring Christian [name] to our clinic. The following note includes my evaluation and treatment recommendations.  Chief Complaint:   OBESITY Christian [name] (MR# [id number]) is a 74 y.o. male who presents for evaluation and treatment of obesity and related comorbidities. Current BMI is Body mass index is 39.89 kg/m.[name] Kitchen Christian [name] has been struggling with his weight for many years and has been unsuccessful in either losing weight, maintaining weight loss, or reaching his healthy weight goal.  Christian [name] is currently in the action stage of change and ready to dedicate time achieving and maintaining a healthier weight. Christian [name] is interested in becoming our patient and working on intensive lifestyle modifications including (but not limited to) diet and exercise for weight loss.  Christian [name] does like to cook. He craves hamburgers. He dislikes some vegetables. He skips breakfast.  Christian [name]'s habits were reviewed today and are as follows: His family eats meals together, he thinks his family will eat healthier with [name], his desired weight loss is 78 lbs, he started gaining weight in 2008, his heaviest weight ever was 289 pounds, he craves hamburgers, he skips breakfast 6 days a week, he frequently makes poor food choices and he has binge eating behaviors.  Depression Screen Christian [name] Food and Mood (modified PHQ-9) score was 6.  Depression screen PHQ 2/9 06/24/2020  Decreased Interest 2  Down, Depressed, Hopeless 1  PHQ - 2 Score 3  Altered sleeping 1  Tired, decreased energy 2  Change in appetite 0  Feeling bad or failure about yourself  0  Trouble concentrating 0  Moving slowly or fidgety/restless 0  Suicidal thoughts 0  PHQ-9 Score 6  Difficult doing work/chores Somewhat difficult   Subjective:   Other fatigue. Christian [name] admits to daytime somnolence and denies waking up still tired. Patent has a history of symptoms of daytime fatigue and Epworth sleepiness  scale. Christian [name] generally gets 6-8 hours of sleep per night, and states that he does not sleep well most nights. Snoring is present. Apneic episodes are not present. Epworth Sleepiness Score is 10.  Shortness of breath on exertion. Christian [name] notes increasing shortness of breath with certain activities and seems to be worsening over time with weight gain. He notes getting out of breath sooner with activity than he used to. This has gotten worse recently. Christian [name] denies shortness of breath at rest or orthopnea.  Essential hypertension. Christian [name] is taking amlodipine, lisinopril, and Lasix. Blood pressure is well controlled.  BP Readings from Last 3 Encounters:  06/24/20 105/70  06/15/20 102/70  01/30/20 (!) 140/99   Lab Results  Component Value Date   CREATININE 1.13 01/07/2020   CREATININE 1.24 12/06/2019   CREATININE 1.16 02/06/2019   Other hyperlipidemia. Christian [name] is taking rosuvastatin.   Lab Results  Component Value Date   CHOL 127 06/15/2020   HDL 52 06/15/2020   LDLCALC 63 06/15/2020   TRIG 57 06/15/2020   CHOLHDL 2.4 06/15/2020   Lab Results  Component Value Date   ALT 7 06/15/2020   AST 16 07/08/2019   ALKPHOS 63 07/08/2019   BILITOT 0.4 07/08/2019   The ASCVD Risk score Christian Bussing DC Jr., Christian al., Christian [name]) failed to calculate for the following reasons:   The valid total cholesterol range is 130 to 320 mg/dL  Coronary artery disease involving native coronary artery of native heart without angina pectoris. Christian [name] is taking ASA and Lasix (see blood pressure medications) - diastolic dysfunction.  Osteoarthritis of both knees,  unspecified osteoarthritis type. [name] has had surgery on both knees and his right hip. He does not see a rheumatologist for his joint pain - takes ASA.  Vitamin D deficiency. Christian [name] is not taking Vitamin D supplementation.  Elevated glucose. Christian [name] reports a normal appetite.  Depression screening. Christian [name] had a mildly positive depression screen with a  PHQ-9 score of 6.  Assessment/Plan:   Other fatigue. Christian [name] does feel that his weight is causing his energy to be lower than it should be. Fatigue may be related to obesity, depression or many other causes. Labs will be ordered, and in the meanwhile, [name] will focus on self care including making healthy food choices, increasing physical activity and focusing on stress reduction. T3, T4, free, TSH labs ordered today.  Shortness of breath on exertion. Christian [name] does feel that he gets out of breath more easily that he used to when he exercises. Christian [name]'s shortness of breath appears to be obesity related and exercise induced. He has agreed to work on weight loss and gradually increase exercise to treat his exercise induced shortness of breath. Will continue to monitor closely.  Essential hypertension. Christian [name] is working on healthy weight loss and exercise to improve blood pressure control. We will watch for signs of hypotension as he continues his lifestyle modifications. He will continue his current medications as directed.  Other hyperlipidemia. Cardiovascular risk and specific lipid/LDL goals reviewed.  We discussed several lifestyle modifications today and Idrees will continue to work on diet, exercise and weight loss efforts. Orders and follow up as documented in patient record. He will continue his medication as directed.   Counseling Intensive lifestyle modifications are the first line treatment for this issue. . Dietary changes: Increase soluble fiber. Decrease simple carbohydrates. . Exercise changes: Moderate to vigorous-intensity aerobic activity 150 minutes per week if tolerated. . Lipid-lowering medications: see documented in medical record.  Coronary artery disease involving native coronary artery of native heart without angina pectoris. Christian [name] will follow-up with his PCP and cardiologist annually.  Osteoarthritis of both knees, unspecified osteoarthritis type. Christian [name] will  gradually increase exercise and follow-up with his PCP as scheduled and directed.   Vitamin D deficiency. Low Vitamin D level contributes to fatigue and are associated with obesity, breast, and colon cancer. VITAMIN D 25 Hydroxy (Vit-D Deficiency, Fractures) level was ordered today.  Elevated glucose.  Hemoglobin A1c, Insulin, random, Comprehensive metabolic panel labs ordered today.  Depression screening. Christian [name] had a positive depression screening. Depression is commonly associated with obesity and often results in emotional eating behaviors. We will monitor this closely and work on CBT to help improve the non-hunger eating patterns. Referral to Psychology may be required if no improvement is seen as he continues in our clinic.  Class 2 severe obesity with serious comorbidity and body mass index (BMI) of 39.0 to 39.9 in adult, unspecified obesity type (Christian [name]).  Christian [name] is currently in the action stage of change and his goal is to continue with weight loss efforts. I recommend Christian [name] begin the structured treatment plan as follows:  He has agreed to the Category 1 Plan + 100 calories.  We reviewed with the patient labs from 06/15/2020 including lipids and 01/07/2020 including BMP and glucose.  Exercise goals: Christian [name] will continue walking in the a.m. 40 minutes on most days.   Behavioral modification strategies: increasing lean protein intake, decreasing simple carbohydrates, increasing vegetables, increasing water intake, decreasing eating out, no skipping meals, meal planning and cooking strategies, keeping healthy foods in the home and  planning for success.  He was informed of the importance of frequent follow-up visits to maximize his success with intensive lifestyle modifications for his multiple health conditions. He was informed we would discuss his lab results at his next visit unless there is a critical issue that needs to be addressed sooner. Coltrane agreed to keep his next visit at the  agreed upon time to discuss these results.  Objective:   Blood pressure 105/70, pulse 67, temperature 98.4 F (36.9 C), height 5\' 10"  (1.778 m), weight 278 lb (126.1 kg), SpO2 97 %. Body mass index is 39.89 kg/m.  EKG: Not done.  Indirect Calorimeter completed today shows a VO2 of 228 and a REE of 1590.  His calculated basal metabolic rate is 8338 thus his basal metabolic rate is worse than expected.  General: Cooperative, alert, well developed, in no acute distress. HEENT: Conjunctivae and lids unremarkable. Cardiovascular: Regular rhythm.  Lungs: Normal work of breathing. Neurologic: No focal deficits.   Lab Results  Component Value Date   CREATININE 1.13 01/07/2020   BUN 20 01/07/2020   NA 139 01/07/2020   K 4.9 01/07/2020   CL 101 01/07/2020   CO2 21 01/07/2020   Lab Results  Component Value Date   ALT 7 06/15/2020   AST 16 07/08/2019   ALKPHOS 63 07/08/2019   BILITOT 0.4 07/08/2019   No results found for: HGBA1C No results found for: INSULIN Lab Results  Component Value Date   TSH 4.400 12/06/2019   Lab Results  Component Value Date   CHOL 127 06/15/2020   HDL 52 06/15/2020   LDLCALC 63 06/15/2020   TRIG 57 06/15/2020   CHOLHDL 2.4 06/15/2020   Lab Results  Component Value Date   WBC 7.4 12/06/2019   HGB 14.5 12/06/2019   HCT 42.8 12/06/2019   MCV 94 12/06/2019   PLT 223 12/06/2019   No results found for: IRON, TIBC, FERRITIN  Obesity Behavioral Intervention Visit Documentation for Insurance:   Approximately 15 minutes were spent on the discussion below.  ASK: We discussed the diagnosis of obesity with [name] today and Avyaan agreed to give [name] permission to discuss obesity behavioral modification therapy today.  ASSESS: Emilio has the diagnosis of obesity and his BMI today is 39.9. Azar is in the action stage of change.   ADVISE: Emersen was educated on the multiple health risks of obesity as well as the benefit of weight loss to improve  his health. He was advised of the need for long term treatment and the importance of lifestyle modifications to improve his current health and to decrease his risk of future health problems.  AGREE: Multiple dietary modification options and treatment options were discussed and Ardit agreed to follow the recommendations documented in the above note.  ARRANGE: Ziere was educated on the importance of frequent visits to treat obesity as outlined per CMS and USPSTF guidelines and agreed to schedule his next follow up appointment today.  Attestation Statements:   Reviewed by clinician on day of visit: allergies, medications, problem list, medical history, surgical history, family history, social history, and previous encounter notes.  Migdalia Dk, am acting as Location manager for [employer], DO   I have reviewed the above documentation for accuracy and completeness, and I agree with the above. Jearld Lesch, DO

## 2020-06-25 LAB — COMPREHENSIVE METABOLIC PANEL
ALT: 8 IU/L (ref 0–44)
AST: 15 IU/L (ref 0–40)
Albumin/Globulin Ratio: 1.9 (ref 1.2–2.2)
Albumin: 4.3 g/dL (ref 3.7–4.7)
Alkaline Phosphatase: 66 IU/L (ref 48–121)
BUN/Creatinine Ratio: 11 (ref 10–24)
BUN: 13 mg/dL (ref 8–27)
Bilirubin Total: 0.6 mg/dL (ref 0.0–1.2)
CO2: 22 mmol/L (ref 20–29)
Calcium: 9.3 mg/dL (ref 8.6–10.2)
Chloride: 101 mmol/L (ref 96–106)
Creatinine, Ser: 1.22 mg/dL (ref 0.76–1.27)
GFR calc Af Amer: 68 mL/min/{1.73_m2} (ref >59)
GFR calc non Af Amer: 58 mL/min/{1.73_m2} — ABNORMAL LOW (ref >59)
Globulin, Total: 2.3 g/dL (ref 1.5–4.5)
Glucose: 81 mg/dL (ref 65–99)
Potassium: 5.4 mmol/L — ABNORMAL HIGH (ref 3.5–5.2)
Sodium: 138 mmol/L (ref 134–144)
Total Protein: 6.6 g/dL (ref 6.0–8.5)

## 2020-06-25 LAB — TSH: TSH: 3.67 u[IU]/mL (ref 0.450–4.500)

## 2020-06-25 LAB — T4, FREE: Free T4: 1.38 ng/dL (ref 0.82–1.77)

## 2020-06-25 LAB — HEMOGLOBIN A1C
Est. average glucose Bld gHb Est-mCnc: 108 mg/dL
Hgb A1c MFr Bld: 5.4 % (ref 4.8–5.6)

## 2020-06-25 LAB — INSULIN, RANDOM: INSULIN: 12.1 u[IU]/mL (ref 2.6–24.9)

## 2020-06-25 LAB — T3: T3, Total: 111 ng/dL (ref 71–180)

## 2020-06-25 LAB — VITAMIN D 25 HYDROXY (VIT D DEFICIENCY, FRACTURES): Vit D, 25-Hydroxy: 43.2 ng/mL (ref 30.0–100.0)

## 2020-07-08 ENCOUNTER — Encounter (INDEPENDENT_AMBULATORY_CARE_PROVIDER_SITE_OTHER): Payer: Self-pay | Admitting: Bariatrics

## 2020-07-08 ENCOUNTER — Other Ambulatory Visit: Payer: Self-pay

## 2020-07-08 ENCOUNTER — Ambulatory Visit (INDEPENDENT_AMBULATORY_CARE_PROVIDER_SITE_OTHER): Payer: Medicare Other | Admitting: Bariatrics

## 2020-07-08 VITALS — BP 118/79 | HR 69 | Temp 97.8°F | Ht 70.0 in | Wt 274.0 lb

## 2020-07-08 DIAGNOSIS — E8881 Metabolic syndrome: Secondary | ICD-10-CM

## 2020-07-08 DIAGNOSIS — I10 Essential (primary) hypertension: Secondary | ICD-10-CM | POA: Diagnosis not present

## 2020-07-08 DIAGNOSIS — Z6839 Body mass index (BMI) 39.0-39.9, adult: Secondary | ICD-10-CM | POA: Diagnosis not present

## 2020-07-09 ENCOUNTER — Encounter (INDEPENDENT_AMBULATORY_CARE_PROVIDER_SITE_OTHER): Payer: Self-pay | Admitting: Bariatrics

## 2020-07-09 NOTE — Progress Notes (Signed)
Chief Complaint:   Christian [name] is here to discuss his progress with his Christian treatment plan along with follow-up of his Christian related diagnoses. Cordarrell is on the Category 1 Plan + 100 calories and states he is following his eating plan approximately 99.9% of the time. Alven states he is walking 40 minutes 6 times per week and swimming 30-60 minutes 6 times per week.  Today's visit was #: 2 Starting weight: 278 lbs Starting date: 06/24/2020 Today's weight: 274 lbs Today's date: 07/08/2020 Total lbs lost to date: 4 Total lbs lost since last in-office visit: 4  Interim History: Damyen has lost 4 lbs since his last office visit. He reports doing well with his water intake.  Subjective:   Insulin resistance, mild. Vong has a diagnosis of insulin resistance based on his elevated fasting insulin level >5. He continues to work on diet and exercise to decrease his risk of diabetes.  Lab Results  Component Value Date   INSULIN 12.1 06/24/2020   Lab Results  Component Value Date   HGBA1C 5.4 06/24/2020   Essential hypertension. Stoney has been on Norvasc and Zestril.  He states blood pressure has been low with his systolic being between  16-109 and diastolic being in the 60'A to 60's  and has spoken to PCP or the associated NP or PA.   BP Readings from Last 3 Encounters:  07/08/20 118/79  06/24/20 105/70  06/15/20 102/70   Lab Results  Component Value Date   CREATININE 1.22 06/24/2020   CREATININE 1.13 01/07/2020   CREATININE 1.24 12/06/2019   Assessment/Plan:   Insulin resistance, mild. Dorrien will continue to work on weight loss, exercise, increasing healthy fats and protein, and decreasing simple carbohydrates to help decrease the risk of diabetes. Aurelius agreed to follow-up with [name] as directed to closely monitor his progress.  Essential hypertension. Smokey is working on healthy weight loss and exercise to improve blood pressure control. We  will watch for signs of hypotension as he continues his lifestyle modifications. PCP talked to the patient and is aware per patient that he is not taking Zestril or Norvasc at this time.[name] Kitchen Nakota will follow-up with PCP with his readings later this week or earlier if blood pressures are high.   Class 2 severe Christian with serious comorbidity and body mass index (BMI) of 39.0 to 39.9 in adult, unspecified Christian type ([facility] [place]).  Raye is currently in the action stage of change. As such, his goal is to continue with weight loss efforts. He has agreed to the Category 1 Plan + 100 calories.  He will work on meal planning and intentional eating.   We independently reviewed with the patient labs from 06/24/2020 including CMP, Vitamin D, A1c, insulin, and thyroid panel.   Exercise goals: Older adults should follow the adult guidelines. When older adults cannot meet the adult guidelines, they should be as physically active as their abilities and conditions will allow.   Behavioral modification strategies: increasing lean protein intake, decreasing simple carbohydrates, increasing vegetables, increasing water intake, decreasing eating out, no skipping meals, meal planning and cooking strategies, keeping healthy foods in the home and planning for success.  Aylan has agreed to follow-up with our clinic in 2 weeks. He was informed of the importance of frequent follow-up visits to maximize his success with intensive lifestyle modifications for his multiple health conditions.   Objective:   Blood pressure 118/79, pulse 69, temperature 97.8 F (36.6 C), height 5\' 10"  (1.778  m), weight 274 lb (124.3 kg), SpO2 96 %. Body mass index is 39.31 kg/m.  General: Cooperative, alert, well developed, in no acute distress. HEENT: Conjunctivae and lids unremarkable. Cardiovascular: Regular rhythm.  Lungs: Normal work of breathing. Neurologic: No focal deficits.   Lab Results  Component Value Date   CREATININE  1.22 06/24/2020   BUN 13 06/24/2020   NA 138 06/24/2020   K 5.4 (H) 06/24/2020   CL 101 06/24/2020   CO2 22 06/24/2020   Lab Results  Component Value Date   ALT 8 06/24/2020   AST 15 06/24/2020   ALKPHOS 66 06/24/2020   BILITOT 0.6 06/24/2020   Lab Results  Component Value Date   HGBA1C 5.4 06/24/2020   Lab Results  Component Value Date   INSULIN 12.1 06/24/2020   Lab Results  Component Value Date   TSH 3.670 06/24/2020   Lab Results  Component Value Date   CHOL 127 06/15/2020   HDL 52 06/15/2020   LDLCALC 63 06/15/2020   TRIG 57 06/15/2020   CHOLHDL 2.4 06/15/2020   Lab Results  Component Value Date   WBC 7.4 12/06/2019   HGB 14.5 12/06/2019   HCT 42.8 12/06/2019   MCV 94 12/06/2019   PLT 223 12/06/2019   No results found for: IRON, TIBC, FERRITIN  Christian Behavioral Intervention Documentation for Insurance:   Approximately 15 minutes were spent on the discussion below.  ASK: We discussed the diagnosis of Christian with [name] today and Azriel agreed to give [name] permission to discuss Christian behavioral modification therapy today.  ASSESS: Aydrien has the diagnosis of Christian and his BMI today is 39.4. Martavis is in the action stage of change.   ADVISE: Isais was educated on the multiple health risks of Christian as well as the benefit of weight loss to improve his health. He was advised of the need for long term treatment and the importance of lifestyle modifications to improve his current health and to decrease his risk of future health problems.  AGREE: Multiple dietary modification options and treatment options were discussed and Kiara agreed to follow the recommendations documented in the above note.  ARRANGE: Ewel was educated on the importance of frequent visits to treat Christian as outlined per CMS and USPSTF guidelines and agreed to schedule his next follow up appointment today.  Attestation Statements:   Reviewed by clinician on day of  visit: allergies, medications, problem list, medical history, surgical history, family history, social history, and previous encounter notes.  Migdalia Dk, am acting as Location manager for [employer], DO   I have reviewed the above documentation for accuracy and completeness, and I agree with the above. Jearld Lesch, DO

## 2020-07-28 ENCOUNTER — Ambulatory Visit (INDEPENDENT_AMBULATORY_CARE_PROVIDER_SITE_OTHER): Payer: Medicare Other | Admitting: Bariatrics

## 2020-07-28 ENCOUNTER — Encounter (INDEPENDENT_AMBULATORY_CARE_PROVIDER_SITE_OTHER): Payer: Self-pay | Admitting: Bariatrics

## 2020-07-28 ENCOUNTER — Other Ambulatory Visit: Payer: Self-pay

## 2020-07-28 VITALS — BP 121/78 | HR 63 | Temp 97.7°F | Ht 70.0 in | Wt 266.0 lb

## 2020-07-28 DIAGNOSIS — I10 Essential (primary) hypertension: Secondary | ICD-10-CM | POA: Diagnosis not present

## 2020-07-28 DIAGNOSIS — Z6838 Body mass index (BMI) 38.0-38.9, adult: Secondary | ICD-10-CM | POA: Diagnosis not present

## 2020-07-28 DIAGNOSIS — E8881 Metabolic syndrome: Secondary | ICD-10-CM | POA: Diagnosis not present

## 2020-07-28 NOTE — Progress Notes (Signed)
Chief Complaint:   OBESITY Christian [name] is here to discuss his progress with his obesity treatment plan along with follow-up of his obesity related diagnoses. Christian [name] is on the Category 1 Plan + 100 calories and states he is following his eating plan approximately 99% of the time. Christian [name] states he is swimming 30-60 minutes 6-7 times per week and walking 40 minutes 6 times per week.  Today's visit was #: 3 Starting weight: 278 lbs Starting date: 06/24/2020 Today's weight: 266 lbs Today's date: 07/28/2020 Total lbs lost to date: 12 Total lbs lost since last in-office visit: 8  Interim History: Christian [name] is down 8 lbs and has done relatively well overall. He is watching what he is eating (more salads and increased protein). He reports drinking more water.  Subjective:   Insulin resistance. Christian [name] has a diagnosis of insulin resistance based on his elevated fasting insulin level >5. He continues to work on diet and exercise to decrease his risk of diabetes. Christian [name] is on no medication.  Lab Results  Component Value Date   INSULIN 12.1 06/24/2020   Lab Results  Component Value Date   HGBA1C 5.4 06/24/2020   Essential hypertension. Christian [name] is on no medication. Blood pressure is controlled.  BP Readings from Last 3 Encounters:  07/28/20 121/78  07/08/20 118/79  06/24/20 105/70   Lab Results  Component Value Date   CREATININE 1.22 06/24/2020   CREATININE 1.13 01/07/2020   CREATININE 1.24 12/06/2019   Assessment/Plan:   Insulin resistance. Christian [name] will continue to work on weight loss, exercise, and decreasing simple carbohydrates to help decrease the risk of diabetes. Christian [name] agreed to follow-up with [name] as directed to closely monitor his progress.  Essential hypertension. Christian [name] is working on healthy weight loss and exercise to improve blood pressure control. We will watch for signs of hypotension as he continues his lifestyle modifications. He will follow-up with his  PCP; no blood pressure medications at this time.  Class 2 severe obesity with serious comorbidity and body mass index (BMI) of 38.0 to 38.9 in adult, unspecified obesity type (Christian [name]).  Christian [name] is currently in the action stage of change. As such, his goal is to continue with weight loss efforts. He has agreed to the Category 1 Plan + 100 calories.  He will work on meal planning and intentional eating. We discussed carbs.   Exercise goals: Christian [name] will continue his current exercise regimen.  Behavioral modification strategies: increasing lean protein intake, decreasing simple carbohydrates, increasing vegetables, increasing water intake, decreasing eating out, no skipping meals, meal planning and cooking strategies, keeping healthy foods in the home and planning for success.  Christian [name] has agreed to follow-up with our clinic in 2 weeks. He was informed of the importance of frequent follow-up visits to maximize his success with intensive lifestyle modifications for his multiple health conditions.   Objective:   Blood pressure 121/78, pulse 63, temperature 97.7 F (36.5 C), height 5\' 10"  (1.778 m), weight 266 lb (120.7 kg), SpO2 97 %. Body mass index is 38.17 kg/m.  General: Cooperative, alert, well developed, in no acute distress. HEENT: Conjunctivae and lids unremarkable. Cardiovascular: Regular rhythm.  Lungs: Normal work of breathing. Neurologic: No focal deficits.   Lab Results  Component Value Date   CREATININE 1.22 06/24/2020   BUN 13 06/24/2020   NA 138 06/24/2020   K 5.4 (H) 06/24/2020   CL 101 06/24/2020   CO2 22 06/24/2020   Lab Results  Component Value Date  ALT 8 06/24/2020   AST 15 06/24/2020   ALKPHOS 66 06/24/2020   BILITOT 0.6 06/24/2020   Lab Results  Component Value Date   HGBA1C 5.4 06/24/2020   Lab Results  Component Value Date   INSULIN 12.1 06/24/2020   Lab Results  Component Value Date   TSH 3.670 06/24/2020   Lab Results  Component Value Date     CHOL 127 06/15/2020   HDL 52 06/15/2020   LDLCALC 63 06/15/2020   TRIG 57 06/15/2020   CHOLHDL 2.4 06/15/2020   Lab Results  Component Value Date   WBC 7.4 12/06/2019   HGB 14.5 12/06/2019   HCT 42.8 12/06/2019   MCV 94 12/06/2019   PLT 223 12/06/2019   No results found for: IRON, TIBC, FERRITIN  Obesity Behavioral Intervention Documentation for Insurance:   Approximately 15 minutes were spent on the discussion below.  ASK: We discussed the diagnosis of obesity with Christian [name] today and Christian [name] agreed to give [name] permission to discuss obesity behavioral modification therapy today.  ASSESS: Christian [name] has the diagnosis of obesity and his BMI today is 38.2. Christian [name] is in the action stage of change.   ADVISE: Christian [name] was educated on the multiple health risks of obesity as well as the benefit of weight loss to improve his health. He was advised of the need for long term treatment and the importance of lifestyle modifications to improve his current health and to decrease his risk of future health problems.  AGREE: Multiple dietary modification options and treatment options were discussed and Christian [name] agreed to follow the recommendations documented in the above note.  ARRANGE: Christian [name] was educated on the importance of frequent visits to treat obesity as outlined per CMS and USPSTF guidelines and agreed to schedule his next follow up appointment today.  Attestation Statements:   Reviewed by clinician on day of visit: allergies, medications, problem list, medical history, surgical history, family history, social history, and previous encounter notes.  Migdalia Dk, am acting as Location manager for [employer], DO   I have reviewed the above documentation for accuracy and completeness, and I agree with the above. Jearld Lesch, DO

## 2020-07-31 DIAGNOSIS — H40013 Open angle with borderline findings, low risk, bilateral: Secondary | ICD-10-CM | POA: Diagnosis not present

## 2020-07-31 DIAGNOSIS — H26493 Other secondary cataract, bilateral: Secondary | ICD-10-CM | POA: Diagnosis not present

## 2020-07-31 DIAGNOSIS — H35033 Hypertensive retinopathy, bilateral: Secondary | ICD-10-CM | POA: Diagnosis not present

## 2020-07-31 DIAGNOSIS — Z961 Presence of intraocular lens: Secondary | ICD-10-CM | POA: Diagnosis not present

## 2020-08-17 ENCOUNTER — Other Ambulatory Visit: Payer: Self-pay

## 2020-08-17 DIAGNOSIS — R072 Precordial pain: Secondary | ICD-10-CM

## 2020-08-17 NOTE — Telephone Encounter (Signed)
Left message for patient to call back  

## 2020-08-18 ENCOUNTER — Encounter (INDEPENDENT_AMBULATORY_CARE_PROVIDER_SITE_OTHER): Payer: Self-pay | Admitting: Bariatrics

## 2020-08-18 ENCOUNTER — Other Ambulatory Visit: Payer: Self-pay

## 2020-08-18 ENCOUNTER — Ambulatory Visit (INDEPENDENT_AMBULATORY_CARE_PROVIDER_SITE_OTHER): Payer: Medicare Other | Admitting: Bariatrics

## 2020-08-18 VITALS — BP 123/83 | HR 64 | Temp 97.8°F | Ht 70.0 in | Wt 254.0 lb

## 2020-08-18 DIAGNOSIS — Z6836 Body mass index (BMI) 36.0-36.9, adult: Secondary | ICD-10-CM | POA: Diagnosis not present

## 2020-08-18 DIAGNOSIS — E7849 Other hyperlipidemia: Secondary | ICD-10-CM | POA: Diagnosis not present

## 2020-08-18 DIAGNOSIS — I10 Essential (primary) hypertension: Secondary | ICD-10-CM

## 2020-08-18 NOTE — Progress Notes (Signed)
Chief Complaint:   OBESITY Christian [name] is here to discuss his progress with his obesity treatment plan along with follow-up of his obesity related diagnoses. Christian [name] is on the Category 1 Plan + 100 calories and states he is following his eating plan approximately 99.9% of the time. Christian [name] states he is swimming 60 minutes 6 times per week and walking 40 minutes 6 times per week.  Today's visit was #: 4 Starting weight: 278 lbs Starting date: 06/24/2020 Today's weight: 254 lbs Today's date: 08/18/2020 Total lbs lost to date: 24 Total lbs lost since last in-office visit: 12  Interim History: Christian [name] is down 12 lbs and states he is sticking with the plan. He reports getting adequate water intake.  Subjective:   Other hyperlipidemia. Christian [name] is taking Crestor.   Lab Results  Component Value Date   CHOL 127 06/15/2020   HDL 52 06/15/2020   LDLCALC 63 06/15/2020   TRIG 57 06/15/2020   CHOLHDL 2.4 06/15/2020   Lab Results  Component Value Date   ALT 8 06/24/2020   AST 15 06/24/2020   ALKPHOS 66 06/24/2020   BILITOT 0.6 06/24/2020   The ASCVD Risk score Christian [name]., et al., 2013) failed to calculate for the following reasons:   The valid total cholesterol range is 130 to 320 mg/dL  Essential hypertension. Christian [name] is taking Imdur and Lasix. Blood pressure is controlled.  BP Readings from Last 3 Encounters:  08/18/20 123/83  07/28/20 121/78  07/08/20 118/79   Lab Results  Component Value Date   CREATININE 1.22 06/24/2020   CREATININE 1.13 01/07/2020   CREATININE 1.24 12/06/2019   Assessment/Plan:   Other hyperlipidemia. Cardiovascular risk and specific lipid/LDL goals reviewed.  We discussed several lifestyle modifications today and Christian [name] will continue to work on diet, exercise and weight loss efforts. Orders and follow up as documented in patient record. He will continue his medication as directed.   Counseling Intensive lifestyle modifications are the  first line treatment for this issue. . Dietary changes: Increase soluble fiber. Decrease simple carbohydrates. . Exercise changes: Moderate to vigorous-intensity aerobic activity 150 minutes per week if tolerated. . Lipid-lowering medications: see documented in medical record.  Essential hypertension. Christian [name] is working on healthy weight loss and exercise to improve blood pressure control. We will watch for signs of hypotension as he continues his lifestyle modifications. He will continue his medications as directed.   Class 2 severe obesity with serious comorbidity and body mass index (BMI) of 36.0 to 36.9 in adult, unspecified obesity type ([place]).  Christian [name] is currently in the action stage of change. As such, his goal is to continue with weight loss efforts. He has agreed to the Category 1 Plan + 100 calories.  He will work on meal planning and intentional eating.    Exercise goals: Christian [name] will continue swimming/walking 6 times per week.  Behavioral modification strategies: increasing lean protein intake, decreasing simple carbohydrates, increasing vegetables, increasing water intake, decreasing eating out, no skipping meals, meal planning and cooking strategies and keeping healthy foods in the home.  Christian [name] has agreed to follow-up with our clinic in 2-3 weeks. He was informed of the importance of frequent follow-up visits to maximize his success with intensive lifestyle modifications for his multiple health conditions.   Objective:   Blood pressure 123/83, pulse 64, temperature 97.8 F (36.6 C), temperature source Oral, height 5\' 10"  (1.778 m), weight 254 lb (115.2 kg), SpO2 96 %. Body mass index is 36.45 kg/m.  General: Cooperative, alert, well developed, in no acute distress. HEENT: Conjunctivae and lids unremarkable. Cardiovascular: Regular rhythm.  Lungs: Normal work of breathing. Neurologic: No focal deficits.   Lab Results  Component Value Date   CREATININE 1.22 06/24/2020     BUN 13 06/24/2020   NA 138 06/24/2020   K 5.4 (H) 06/24/2020   CL 101 06/24/2020   CO2 22 06/24/2020   Lab Results  Component Value Date   ALT 8 06/24/2020   AST 15 06/24/2020   ALKPHOS 66 06/24/2020   BILITOT 0.6 06/24/2020   Lab Results  Component Value Date   HGBA1C 5.4 06/24/2020   Lab Results  Component Value Date   INSULIN 12.1 06/24/2020   Lab Results  Component Value Date   TSH 3.670 06/24/2020   Lab Results  Component Value Date   CHOL 127 06/15/2020   HDL 52 06/15/2020   LDLCALC 63 06/15/2020   TRIG 57 06/15/2020   CHOLHDL 2.4 06/15/2020   Lab Results  Component Value Date   WBC 7.4 12/06/2019   HGB 14.5 12/06/2019   HCT 42.8 12/06/2019   MCV 94 12/06/2019   PLT 223 12/06/2019   No results found for: IRON, TIBC, FERRITIN  Obesity Behavioral Intervention Documentation for Insurance:   Approximately 15 minutes were spent on the discussion below.  ASK: We discussed the diagnosis of obesity with Christian [name] today and Christian [name] agreed to give [name] permission to discuss obesity behavioral modification therapy today.  ASSESS: Christian [name] has the diagnosis of obesity and his BMI today is 36.5. Christian [name] is in the action stage of change.   ADVISE: Christian [name] was educated on the multiple health risks of obesity as well as the benefit of weight loss to improve his health. He was advised of the need for long term treatment and the importance of lifestyle modifications to improve his current health and to decrease his risk of future health problems.  AGREE: Multiple dietary modification options and treatment options were discussed and Christian [name] agreed to follow the recommendations documented in the above note.  ARRANGE: Christian [name] was educated on the importance of frequent visits to treat obesity as outlined per CMS and USPSTF guidelines and agreed to schedule his next follow up appointment today.  Attestation Statements:   Reviewed by clinician on day of visit: allergies,  medications, problem list, medical history, surgical history, family history, social history, and previous encounter notes.  Migdalia Dk, am acting as Location manager for [employer], DO   I have reviewed the above documentation for accuracy and completeness, and I agree with the above. Jearld Lesch, DO

## 2020-08-19 ENCOUNTER — Telehealth (HOSPITAL_COMMUNITY): Payer: Self-pay | Admitting: *Deleted

## 2020-08-19 NOTE — Telephone Encounter (Signed)
Err

## 2020-08-20 ENCOUNTER — Telehealth (HOSPITAL_COMMUNITY): Payer: Self-pay | Admitting: *Deleted

## 2020-08-20 NOTE — Telephone Encounter (Signed)
Patient given detailed instructions per Myocardial Perfusion Study Information Sheet for the test on 08/24/20 at 7:45. Patient notified to arrive 15 minutes early and that it is imperative to arrive on time for appointment to keep from having the test rescheduled.  If you need to cancel or reschedule your appointment, please call the office within 24 hours of your appointment. . Patient verbalized understanding.Christian [name]

## 2020-08-24 ENCOUNTER — Ambulatory Visit (HOSPITAL_COMMUNITY): Payer: Medicare Other | Attending: Cardiology

## 2020-08-24 ENCOUNTER — Other Ambulatory Visit: Payer: Self-pay

## 2020-08-24 DIAGNOSIS — R072 Precordial pain: Secondary | ICD-10-CM | POA: Insufficient documentation

## 2020-08-24 LAB — MYOCARDIAL PERFUSION IMAGING
LV dias vol: 71 mL (ref 62–150)
LV sys vol: 34 mL
Peak HR: 80 {beats}/min
Rest HR: 60 {beats}/min
SDS: 2
SRS: 0
SSS: 2
TID: 1.15

## 2020-08-24 MED ORDER — REGADENOSON 0.4 MG/5ML IV SOLN
0.4000 mg | Freq: Once | INTRAVENOUS | Status: AC
  Administered 2020-08-24: 0.4 mg via INTRAVENOUS

## 2020-08-24 MED ORDER — TECHNETIUM TC 99M TETROFOSMIN IV KIT
31.5000 | PACK | Freq: Once | INTRAVENOUS | Status: AC | PRN
  Administered 2020-08-24: 31.5 via INTRAVENOUS
  Filled 2020-08-24: qty 32

## 2020-08-24 MED ORDER — TECHNETIUM TC 99M TETROFOSMIN IV KIT
10.1000 | PACK | Freq: Once | INTRAVENOUS | Status: AC | PRN
  Administered 2020-08-24: 10.1 via INTRAVENOUS
  Filled 2020-08-24: qty 11

## 2020-09-01 ENCOUNTER — Other Ambulatory Visit: Payer: Self-pay

## 2020-09-01 ENCOUNTER — Ambulatory Visit (INDEPENDENT_AMBULATORY_CARE_PROVIDER_SITE_OTHER): Payer: Medicare Other | Admitting: Bariatrics

## 2020-09-01 ENCOUNTER — Encounter (INDEPENDENT_AMBULATORY_CARE_PROVIDER_SITE_OTHER): Payer: Self-pay | Admitting: Bariatrics

## 2020-09-01 VITALS — BP 116/74 | HR 68 | Temp 97.8°F | Ht 70.0 in | Wt 248.0 lb

## 2020-09-01 DIAGNOSIS — Z6835 Body mass index (BMI) 35.0-35.9, adult: Secondary | ICD-10-CM

## 2020-09-01 DIAGNOSIS — E88819 Insulin resistance, unspecified: Secondary | ICD-10-CM

## 2020-09-01 DIAGNOSIS — E7800 Pure hypercholesterolemia, unspecified: Secondary | ICD-10-CM | POA: Diagnosis not present

## 2020-09-01 DIAGNOSIS — E8881 Metabolic syndrome: Secondary | ICD-10-CM

## 2020-09-02 NOTE — Progress Notes (Signed)
Chief Complaint:   OBESITY Christian [name] is here to discuss his progress with his obesity treatment plan along with follow-up of his obesity related diagnoses. [name] is on the Category 1 Plan + 100 calories and states he is following his eating plan approximately 99.9% of the time. [name] states he is swimming 45-60 minutes 6-7 times per week and walking 45 minutes 6-7 times per week.  Today's visit was #: 5 Starting weight: 278 lbs Starting date: 06/24/2020 Today's weight: 248 lbs Today's date: 09/01/2020 Total lbs lost to date: 30 Total lbs lost since last in-office visit: 6  Interim History: Christian [name] is down another 6 lbs since his last visit and states he has not struggled with the plan.  Subjective:   Hypercholesterolemia. Christian [name] is taking Crestor.  Insulin resistance, mild. Christian [name] has a diagnosis of insulin resistance based on his elevated fasting insulin level >5. He continues to work on diet and exercise to decrease his risk of diabetes. No polyphagia.  Lab Results  Component Value Date   INSULIN 12.1 06/24/2020   Lab Results  Component Value Date   HGBA1C 5.4 06/24/2020   Assessment/Plan:   Hypercholesterolemia. Erven will continue Crestor as directed.   Insulin resistance, mild. Christian [name] will continue to work on weight loss, exercise, increasing healthy fats and protein, and decreasing simple carbohydrates to help decrease the risk of diabetes. Christian [name] agreed to follow-up with [name] as directed to closely monitor his progress.  Class 2 severe obesity with serious comorbidity and body mass index (BMI) of 35.0 to 35.9 in adult, unspecified obesity type (Christian [name]).  Christian [name] is currently in the action stage of change. As such, his goal is to continue with weight loss efforts. He has agreed to the Category 1 Plan + 100 calories.  He will work on meal planning and intentional eating.   Handout was provided on Smart [employer] Choices.   Exercise goals: Christian [name] will  continue walking and swimming for exercise.  Behavioral modification strategies: increasing lean protein intake, decreasing simple carbohydrates, increasing vegetables, increasing water intake, decreasing eating out, no skipping meals, meal planning and cooking strategies, keeping healthy foods in the home and planning for success.  Christian [name] has agreed to follow-up with our clinic in 4 weeks (first full week of October). He was informed of the importance of frequent follow-up visits to maximize his success with intensive lifestyle modifications for his multiple health conditions.   Objective:   Blood pressure 116/74, pulse 68, temperature 97.8 F (36.6 C), temperature source Oral, height 5\' 10"  (1.778 m), weight 248 lb (112.5 kg), SpO2 96 %. Body mass index is 35.58 kg/m.  General: Cooperative, alert, well developed, in no acute distress. HEENT: Conjunctivae and lids unremarkable. Cardiovascular: Regular rhythm.  Lungs: Normal work of breathing. Neurologic: No focal deficits.   Lab Results  Component Value Date   CREATININE 1.22 06/24/2020   BUN 13 06/24/2020   NA 138 06/24/2020   K 5.4 (H) 06/24/2020   CL 101 06/24/2020   CO2 22 06/24/2020   Lab Results  Component Value Date   ALT 8 06/24/2020   AST 15 06/24/2020   ALKPHOS 66 06/24/2020   BILITOT 0.6 06/24/2020   Lab Results  Component Value Date   HGBA1C 5.4 06/24/2020   Lab Results  Component Value Date   INSULIN 12.1 06/24/2020   Lab Results  Component Value Date   TSH 3.670 06/24/2020   Lab Results  Component Value Date   CHOL 127 06/15/2020  HDL 52 06/15/2020   LDLCALC 63 06/15/2020   TRIG 57 06/15/2020   CHOLHDL 2.4 06/15/2020   Lab Results  Component Value Date   WBC 7.4 12/06/2019   HGB 14.5 12/06/2019   HCT 42.8 12/06/2019   MCV 94 12/06/2019   PLT 223 12/06/2019   No results found for: IRON, TIBC, FERRITIN  Obesity Behavioral Intervention:   Approximately 15 minutes were spent on the  discussion below.  ASK: We discussed the diagnosis of obesity with Christian [name] today and Christian [name] agreed to give [name] permission to discuss obesity behavioral modification therapy today.  ASSESS: Christian [name] has the diagnosis of obesity and his BMI today is 35.7. Christian [name] is in the action stage of change.   ADVISE: Christian [name] was educated on the multiple health risks of obesity as well as the benefit of weight loss to improve his health. He was advised of the need for long term treatment and the importance of lifestyle modifications to improve his current health and to decrease his risk of future health problems.  AGREE: Multiple dietary modification options and treatment options were discussed and Christian [name] agreed to follow the recommendations documented in the above note.  ARRANGE: Christian [name] was educated on the importance of frequent visits to treat obesity as outlined per CMS and USPSTF guidelines and agreed to schedule his next follow up appointment today.  Attestation Statements:   Reviewed by clinician on day of visit: allergies, medications, problem list, medical history, surgical history, family history, social history, and previous encounter notes.  IMichaelene Song, am acting as Location manager for [employer], DO .  I have reviewed the above documentation for accuracy and completeness, and I agree with the above. Jearld Lesch, DO

## 2020-09-03 ENCOUNTER — Encounter (INDEPENDENT_AMBULATORY_CARE_PROVIDER_SITE_OTHER): Payer: Self-pay | Admitting: Bariatrics

## 2020-09-28 ENCOUNTER — Other Ambulatory Visit (HOSPITAL_BASED_OUTPATIENT_CLINIC_OR_DEPARTMENT_OTHER): Payer: Self-pay | Admitting: Internal Medicine

## 2020-09-28 ENCOUNTER — Ambulatory Visit: Payer: Medicare Other | Attending: Internal Medicine

## 2020-09-28 DIAGNOSIS — Z23 Encounter for immunization: Secondary | ICD-10-CM

## 2020-09-28 NOTE — Progress Notes (Signed)
   Covid-19 Vaccination Clinic  Name:  Christian [name]    MRN: [id number] DOB: [birth year]  09/28/2020  Mr. Christian [name] was observed post Covid-19 immunization for 15 minutes without incident. He was provided with Vaccine Information Sheet and instruction to access the V-Safe system. Vaccinated by Harriet Pho.  Mr. Christian [name] was instructed to call 911 with any severe reactions post vaccine: [name] Kitchen Difficulty breathing  . Swelling of face and throat  . A fast heartbeat  . A bad rash all over body  . Dizziness and weakness

## 2020-10-02 MED FILL — PFIZER-BIONTECH COVID-19 VA: 30 | 1 days supply | Qty: 0 | Fill #0

## 2020-10-05 ENCOUNTER — Ambulatory Visit (INDEPENDENT_AMBULATORY_CARE_PROVIDER_SITE_OTHER): Payer: Medicare Other | Admitting: Bariatrics

## 2020-10-05 ENCOUNTER — Encounter (INDEPENDENT_AMBULATORY_CARE_PROVIDER_SITE_OTHER): Payer: Self-pay | Admitting: Bariatrics

## 2020-10-05 ENCOUNTER — Other Ambulatory Visit: Payer: Self-pay

## 2020-10-05 VITALS — BP 138/82 | HR 58 | Temp 97.9°F | Ht 70.0 in | Wt 233.0 lb

## 2020-10-05 DIAGNOSIS — R7309 Other abnormal glucose: Secondary | ICD-10-CM | POA: Diagnosis not present

## 2020-10-05 DIAGNOSIS — E7849 Other hyperlipidemia: Secondary | ICD-10-CM

## 2020-10-05 DIAGNOSIS — E669 Obesity, unspecified: Secondary | ICD-10-CM

## 2020-10-05 DIAGNOSIS — E559 Vitamin D deficiency, unspecified: Secondary | ICD-10-CM | POA: Diagnosis not present

## 2020-10-05 DIAGNOSIS — E8881 Metabolic syndrome: Secondary | ICD-10-CM

## 2020-10-05 DIAGNOSIS — M170 Bilateral primary osteoarthritis of knee: Secondary | ICD-10-CM | POA: Diagnosis not present

## 2020-10-05 DIAGNOSIS — I2510 Atherosclerotic heart disease of native coronary artery without angina pectoris: Secondary | ICD-10-CM | POA: Diagnosis not present

## 2020-10-05 DIAGNOSIS — Z6833 Body mass index (BMI) 33.0-33.9, adult: Secondary | ICD-10-CM | POA: Diagnosis not present

## 2020-10-05 DIAGNOSIS — E7800 Pure hypercholesterolemia, unspecified: Secondary | ICD-10-CM | POA: Diagnosis not present

## 2020-10-05 NOTE — Progress Notes (Signed)
Chief Complaint:   OBESITY Christian [name] is here to discuss his progress with his obesity treatment plan along with follow-up of his obesity related diagnoses. Christian [name] is on the Category 1 Plan + 100 calories and states he is following his eating plan approximately 99.9% of the time. Christian [name] states he is walking/swimming 45/60 minutes 6-7 times per week.  Today's visit was #: 6 Starting weight: 278 lbs Starting date: 06/24/2020 Today's weight: 233 lbs Today's date: 10/05/2020 Total lbs lost to date: 45 Total lbs lost since last in-office visit: 15  Interim History: Christian [name] is down 15 lbs since his last visit.  Subjective:   Other hyperlipidemia. Christian [name] is taking Crestor and Imdur.   Lab Results  Component Value Date   CHOL 127 06/15/2020   HDL 52 06/15/2020   LDLCALC 63 06/15/2020   TRIG 57 06/15/2020   CHOLHDL 2.4 06/15/2020   Lab Results  Component Value Date   ALT 8 06/24/2020   AST 15 06/24/2020   ALKPHOS 66 06/24/2020   BILITOT 0.6 06/24/2020   The ASCVD Risk score Christian [name]., et al., 2013) failed to calculate for the following reasons:   The valid total cholesterol range is 130 to 320 mg/dL  Osteoarthritis of both knees, unspecified osteoarthritis type. Christian [name] reports occasional pain in his right knee with improved knee pain on the left.  Insulin resistance. Christian [name] has a diagnosis of insulin resistance based on his elevated fasting insulin level >5. He continues to work on diet and exercise to decrease his risk of diabetes. No polyphagia.  Lab Results  Component Value Date   INSULIN 12.1 06/24/2020   Lab Results  Component Value Date   HGBA1C 5.4 06/24/2020   Vitamin D deficiency. Christian [name] is taking OTC Vitamin D supplementation.    Ref. Range 06/24/2020 14:17  Vitamin D, 25-Hydroxy Latest Ref Range: 30.0 - 100.0 ng/mL 43.2   Assessment/Plan:   Other hyperlipidemia. Cardiovascular risk and specific lipid/LDL goals reviewed.  We discussed  several lifestyle modifications today and Kerney will continue to work on diet, exercise and weight loss efforts. Orders and follow up as documented in patient record. Jackson will continue Crestor as directed. Lipid Panel With LDL/HDL Ratio will be checked today.  Counseling Intensive lifestyle modifications are the first line treatment for this issue. . Dietary changes: Increase soluble fiber. Decrease simple carbohydrates. . Exercise changes: Moderate to vigorous-intensity aerobic activity 150 minutes per week if tolerated. . Lipid-lowering medications: see documented in medical record.   Osteoarthritis of both knees, unspecified osteoarthritis type. Christian [name] will continue to lose weight.  Insulin resistance. Christian [name] will continue to work on weight loss, exercise, and decreasing simple carbohydrates to help decrease the risk of diabetes. Christian [name] agreed to follow-up with [name] as directed to closely monitor his progress. Will check A1c and Insulin, random levels today.  Vitamin D deficiency. Low Vitamin D level contributes to fatigue and are associated with obesity, breast, and colon cancer. VITAMIN D 25 Hydroxy (Vit-D Deficiency, Fractures) level will be checked today.   Class 1 obesity with serious comorbidity and body mass index (BMI) of 33.0 to 33.9 in adult, unspecified obesity type.  Christian [name] is currently in the action stage of change. As such, his goal is to continue with weight loss efforts. He has agreed to the Category 1 Plan + 100 calories.   He will work on meal planning and will continue to adhere closely to the plan.  Exercise goals: Christian [name] will continue walking/swimming 45/60  minutes 6-7 times per week.  Behavioral modification strategies: increasing lean protein intake, decreasing simple carbohydrates, increasing vegetables, increasing water intake, decreasing eating out, no skipping meals, meal planning and cooking strategies, keeping healthy foods in the home and planning for  success.  Christian [name] has agreed to follow-up with our clinic in 3 weeks. He was informed of the importance of frequent follow-up visits to maximize his success with intensive lifestyle modifications for his multiple health conditions.   Christian [name] was informed we would discuss his lab results at his next visit unless there is a critical issue that needs to be addressed sooner. Christian [name] agreed to keep his next visit at the agreed upon time to discuss these results.  Objective:   Blood pressure 138/82, pulse (!) 58, temperature 97.9 F (36.6 C), height 5\' 10"  (1.778 m), weight 233 lb (105.7 kg), SpO2 98 %. Body mass index is 33.43 kg/m.  General: Cooperative, alert, well developed, in no acute distress. HEENT: Conjunctivae and lids unremarkable. Cardiovascular: Regular rhythm.  Lungs: Normal work of breathing. Neurologic: No focal deficits.   Lab Results  Component Value Date   CREATININE 1.22 06/24/2020   BUN 13 06/24/2020   NA 138 06/24/2020   K 5.4 (H) 06/24/2020   CL 101 06/24/2020   CO2 22 06/24/2020   Lab Results  Component Value Date   ALT 8 06/24/2020   AST 15 06/24/2020   ALKPHOS 66 06/24/2020   BILITOT 0.6 06/24/2020   Lab Results  Component Value Date   HGBA1C 5.4 06/24/2020   Lab Results  Component Value Date   INSULIN 12.1 06/24/2020   Lab Results  Component Value Date   TSH 3.670 06/24/2020   Lab Results  Component Value Date   CHOL 127 06/15/2020   HDL 52 06/15/2020   LDLCALC 63 06/15/2020   TRIG 57 06/15/2020   CHOLHDL 2.4 06/15/2020   Lab Results  Component Value Date   WBC 7.4 12/06/2019   HGB 14.5 12/06/2019   HCT 42.8 12/06/2019   MCV 94 12/06/2019   PLT 223 12/06/2019   No results found for: IRON, TIBC, FERRITIN  Obesity Behavioral Intervention:   Approximately 15 minutes were spent on the discussion below.  ASK: We discussed the diagnosis of obesity with Christian [name] today and Christian [name] agreed to give [name] permission to discuss obesity  behavioral modification therapy today.  ASSESS: Christian [name] has the diagnosis of obesity and his BMI today is 33.5. Christian [name] is in the action stage of change.   ADVISE: Christian [name] was educated on the multiple health risks of obesity as well as the benefit of weight loss to improve his health. He was advised of the need for long term treatment and the importance of lifestyle modifications to improve his current health and to decrease his risk of future health problems.  AGREE: Multiple dietary modification options and treatment options were discussed and Christian [name] agreed to follow the recommendations documented in the above note.  ARRANGE: Christian [name] was educated on the importance of frequent visits to treat obesity as outlined per CMS and USPSTF guidelines and agreed to schedule his next follow up appointment today.  Attestation Statements:   Reviewed by clinician on day of visit: allergies, medications, problem list, medical history, surgical history, family history, social history, and previous encounter notes.  Migdalia Dk, am acting as Location manager for [employer], DO   I have reviewed the above documentation for accuracy and completeness, and I agree with the above. Jearld Lesch, DO

## 2020-10-06 LAB — COMPREHENSIVE METABOLIC PANEL
ALT: 11 IU/L (ref 0–44)
AST: 15 IU/L (ref 0–40)
Albumin/Globulin Ratio: 1.8 (ref 1.2–2.2)
Albumin: 4.3 g/dL (ref 3.7–4.7)
Alkaline Phosphatase: 66 IU/L (ref 44–121)
BUN/Creatinine Ratio: 14 (ref 10–24)
BUN: 17 mg/dL (ref 8–27)
Bilirubin Total: 0.6 mg/dL (ref 0.0–1.2)
CO2: 25 mmol/L (ref 20–29)
Calcium: 9.4 mg/dL (ref 8.6–10.2)
Chloride: 100 mmol/L (ref 96–106)
Creatinine, Ser: 1.18 mg/dL (ref 0.76–1.27)
GFR calc Af Amer: 70 mL/min/{1.73_m2} (ref >59)
GFR calc non Af Amer: 61 mL/min/{1.73_m2} (ref >59)
Globulin, Total: 2.4 g/dL (ref 1.5–4.5)
Glucose: 91 mg/dL (ref 65–99)
Potassium: 5 mmol/L (ref 3.5–5.2)
Sodium: 136 mmol/L (ref 134–144)
Total Protein: 6.7 g/dL (ref 6.0–8.5)

## 2020-10-06 LAB — VITAMIN D 25 HYDROXY (VIT D DEFICIENCY, FRACTURES): Vit D, 25-Hydroxy: 55 ng/mL (ref 30.0–100.0)

## 2020-10-06 LAB — LIPID PANEL WITH LDL/HDL RATIO
Cholesterol, Total: 111 mg/dL (ref 100–199)
HDL: 46 mg/dL (ref >39)
LDL Chol Calc (NIH): 52 mg/dL (ref 0–99)
LDL/HDL Ratio: 1.1 ratio (ref 0.0–3.6)
Triglycerides: 55 mg/dL (ref 0–149)
VLDL Cholesterol Cal: 13 mg/dL (ref 5–40)

## 2020-10-06 LAB — INSULIN, RANDOM: INSULIN: 6.8 u[IU]/mL (ref 2.6–24.9)

## 2020-10-13 DIAGNOSIS — Z0000 Encounter for general adult medical examination without abnormal findings: Secondary | ICD-10-CM | POA: Diagnosis not present

## 2020-10-13 DIAGNOSIS — Z23 Encounter for immunization: Secondary | ICD-10-CM | POA: Diagnosis not present

## 2020-10-27 ENCOUNTER — Encounter (INDEPENDENT_AMBULATORY_CARE_PROVIDER_SITE_OTHER): Payer: Self-pay | Admitting: Bariatrics

## 2020-10-27 ENCOUNTER — Ambulatory Visit (INDEPENDENT_AMBULATORY_CARE_PROVIDER_SITE_OTHER): Payer: Medicare Other | Admitting: Bariatrics

## 2020-10-27 ENCOUNTER — Other Ambulatory Visit: Payer: Self-pay

## 2020-10-27 VITALS — BP 126/78 | HR 56 | Temp 97.8°F | Ht 70.0 in | Wt 227.0 lb

## 2020-10-27 DIAGNOSIS — Z6832 Body mass index (BMI) 32.0-32.9, adult: Secondary | ICD-10-CM | POA: Diagnosis not present

## 2020-10-27 DIAGNOSIS — E669 Obesity, unspecified: Secondary | ICD-10-CM | POA: Diagnosis not present

## 2020-10-27 DIAGNOSIS — E7849 Other hyperlipidemia: Secondary | ICD-10-CM | POA: Diagnosis not present

## 2020-10-27 DIAGNOSIS — E8881 Metabolic syndrome: Secondary | ICD-10-CM

## 2020-10-27 NOTE — Progress Notes (Signed)
Chief Complaint:   OBESITY Christian [name] is here to discuss his progress with his obesity treatment plan along with follow-up of his obesity related diagnoses. Christian [name] is on the Category 1 Plan + 100 calories and states he is following his eating plan approximately 99.9% of the time. Christian [name] states he is swimming 60 minutes 4 times per week.  Today's visit was #: 7 Starting weight: 278 lbs Starting date: 06/24/2020 Today's weight: 227 lbs Today's date: 10/27/2020 Total lbs lost to date: 51 Total lbs lost since last in-office visit: 6  Interim History: Christian [name] is down an additional 6 lbs since his last visit.  Subjective:   Other hyperlipidemia. Christian [name] is taking Crestor.  Lab Results  Component Value Date   CHOL 111 10/05/2020   HDL 46 10/05/2020   LDLCALC 52 10/05/2020   TRIG 55 10/05/2020   CHOLHDL 2.4 06/15/2020   Lab Results  Component Value Date   ALT 11 10/05/2020   AST 15 10/05/2020   ALKPHOS 66 10/05/2020   BILITOT 0.6 10/05/2020   The ASCVD Risk score Christian [name]) failed to calculate for the following reasons:   The valid total cholesterol range is 130 to 320 mg/dL  Insulin resistance. Christian [name] has a diagnosis of insulin resistance based on his elevated fasting insulin level >5. He continues to work on diet and exercise to decrease his risk of diabetes. Christian [name] is on no medication.  Lab Results  Component Value Date   INSULIN 6.8 10/05/2020   INSULIN 12.1 06/24/2020   Lab Results  Component Value Date   HGBA1C 5.4 06/24/2020   Assessment/Plan:   Other hyperlipidemia. Cardiovascular risk and specific lipid/LDL goals reviewed.  We discussed several lifestyle modifications today and Christian [name] will continue to work on diet, exercise and weight loss efforts. Orders and follow up as documented in patient record. He will continue Crestor as directed.   Counseling Intensive lifestyle modifications are the first line treatment for this  issue. . Dietary changes: Increase soluble fiber. Decrease simple carbohydrates. . Exercise changes: Moderate to vigorous-intensity aerobic activity 150 minutes per week if tolerated. . Lipid-lowering medications: see documented in medical record.  Insulin resistance. Christian [name] will continue to work on weight loss, exercise, increasing healthy fats and protein, and decreasing simple carbohydrates and refined sweets to help decrease the risk of diabetes. Christian [name] agreed to follow-up with [name] as directed to closely monitor his progress.   Class 1 obesity with serious comorbidity and body mass index (BMI) of 32.0 to 32.9 in adult, unspecified obesity type.  Christian [name] is currently in the action stage of change. As such, his goal is to continue with weight loss efforts. He has agreed to the Category 1 Plan + 100 calories.  He will work on meal planning and increasing his water intake.  We reviewed with the patient labs from 10/05/2020 including CMP, lipids, Vitamin D, A1c, and insulin.    Exercise goals: Christian [name] will continue swimming and will continue walking.  Behavioral modification strategies: increasing lean protein intake, decreasing simple carbohydrates, increasing vegetables, increasing water intake, decreasing eating out, no skipping meals, meal planning and cooking strategies, keeping healthy foods in the home and planning for success.  Christian [name] has agreed to follow-up with our clinic in 2-3 weeks. He was informed of the importance of frequent follow-up visits to maximize his success with intensive lifestyle modifications for his multiple health conditions.   Objective:   Blood pressure 126/78, pulse (!) 56, temperature 97.8  F (36.6 C), height 5\' 10"  (1.778 m), weight 227 lb (103 kg), SpO2 98 %. Body mass index is 32.57 kg/m.  General: Cooperative, alert, well developed, in no acute distress. HEENT: Conjunctivae and lids unremarkable. Cardiovascular: Regular rhythm.  Lungs: Normal work  of breathing. Neurologic: No focal deficits.   Lab Results  Component Value Date   CREATININE 1.18 10/05/2020   BUN 17 10/05/2020   NA 136 10/05/2020   K 5.0 10/05/2020   CL 100 10/05/2020   CO2 25 10/05/2020   Lab Results  Component Value Date   ALT 11 10/05/2020   AST 15 10/05/2020   ALKPHOS 66 10/05/2020   BILITOT 0.6 10/05/2020   Lab Results  Component Value Date   HGBA1C 5.4 06/24/2020   Lab Results  Component Value Date   INSULIN 6.8 10/05/2020   INSULIN 12.1 06/24/2020   Lab Results  Component Value Date   TSH 3.670 06/24/2020   Lab Results  Component Value Date   CHOL 111 10/05/2020   HDL 46 10/05/2020   LDLCALC 52 10/05/2020   TRIG 55 10/05/2020   CHOLHDL 2.4 06/15/2020   Lab Results  Component Value Date   WBC 7.4 12/06/2019   HGB 14.5 12/06/2019   HCT 42.8 12/06/2019   MCV 94 12/06/2019   PLT 223 12/06/2019   No results found for: IRON, TIBC, FERRITIN  Obesity Behavioral Intervention:   Approximately 15 minutes were spent on the discussion below.  ASK: We discussed the diagnosis of obesity with Christian [name] today and Christian [name] agreed to give [name] permission to discuss obesity behavioral modification therapy today.  ASSESS: Christian [name] has the diagnosis of obesity and his BMI today is 32.6. Christian [name] is in the action stage of change.   ADVISE: Christian [name] was educated on the multiple health risks of obesity as well as the benefit of weight loss to improve his health. He was advised of the need for long term treatment and the importance of lifestyle modifications to improve his current health and to decrease his risk of future health problems.  AGREE: Multiple dietary modification options and treatment options were discussed and Christian [name] agreed to follow the recommendations documented in the above note.  ARRANGE: Christian [name] was educated on the importance of frequent visits to treat obesity as outlined per CMS and USPSTF guidelines and agreed to schedule his next  follow up appointment today.  Attestation Statements:   Reviewed by clinician on day of visit: allergies, medications, problem list, medical history, surgical history, family history, social history, and previous encounter notes.  Migdalia Dk, am acting as Location manager for [employer], DO   I have reviewed the above documentation for accuracy and completeness, and I agree with the above. Christian Lesch, DO

## 2020-10-28 ENCOUNTER — Encounter (INDEPENDENT_AMBULATORY_CARE_PROVIDER_SITE_OTHER): Payer: Self-pay | Admitting: Bariatrics

## 2020-11-16 ENCOUNTER — Other Ambulatory Visit: Payer: Self-pay | Admitting: Physician Assistant

## 2020-11-16 NOTE — Telephone Encounter (Signed)
rx refill

## 2020-11-17 ENCOUNTER — Other Ambulatory Visit: Payer: Self-pay

## 2020-11-17 ENCOUNTER — Ambulatory Visit (INDEPENDENT_AMBULATORY_CARE_PROVIDER_SITE_OTHER): Payer: Medicare Other | Admitting: Bariatrics

## 2020-11-17 ENCOUNTER — Encounter (INDEPENDENT_AMBULATORY_CARE_PROVIDER_SITE_OTHER): Payer: Self-pay | Admitting: Bariatrics

## 2020-11-17 VITALS — BP 125/84 | HR 58 | Temp 97.5°F | Ht 70.0 in | Wt 221.0 lb

## 2020-11-17 DIAGNOSIS — Z6831 Body mass index (BMI) 31.0-31.9, adult: Secondary | ICD-10-CM | POA: Diagnosis not present

## 2020-11-17 DIAGNOSIS — E7849 Other hyperlipidemia: Secondary | ICD-10-CM | POA: Diagnosis not present

## 2020-11-17 DIAGNOSIS — I10 Essential (primary) hypertension: Secondary | ICD-10-CM

## 2020-11-17 DIAGNOSIS — E8881 Metabolic syndrome: Secondary | ICD-10-CM | POA: Diagnosis not present

## 2020-11-17 DIAGNOSIS — E669 Obesity, unspecified: Secondary | ICD-10-CM

## 2020-11-18 NOTE — Progress Notes (Signed)
Chief Complaint:   OBESITY Christian [name] is here to discuss his progress with his obesity treatment plan along with follow-up of his obesity related diagnoses. Christian [name] is on the Category 1 Plan + 100 calories and states he is following his eating plan approximately 99.9% of the time. Christian [name] states he is walking 60 minutes 6 times per week.  Today's visit was #: 8 Starting weight: 278 lbs Starting date: 06/24/2020 Today's weight: 221 lbs Today's date: 11/17/2020 Total lbs lost to date: 23 Total lbs lost since last in-office visit: 6  Interim History: Christian [name] is down 6 lbs and has done very well overall.  Subjective:   Essential hypertension. Christian [name] is on no medication.  BP Readings from Last 3 Encounters:  11/17/20 125/84  10/27/20 126/78  10/05/20 138/82   Lab Results  Component Value Date   CREATININE 1.18 10/05/2020   CREATININE 1.22 06/24/2020   CREATININE 1.13 01/07/2020   Insulin resistance. Christian [name] has a diagnosis of insulin resistance based on his elevated fasting insulin level >5. He continues to work on diet and exercise to decrease his risk of diabetes. Christian [name] is on no medication.  Lab Results  Component Value Date   INSULIN 6.8 10/05/2020   INSULIN 12.1 06/24/2020   Lab Results  Component Value Date   HGBA1C 5.4 06/24/2020   Other hyperlipidemia. Christian [name] is taking Crestor.   Lab Results  Component Value Date   CHOL 111 10/05/2020   HDL 46 10/05/2020   LDLCALC 52 10/05/2020   TRIG 55 10/05/2020   CHOLHDL 2.4 06/15/2020   Lab Results  Component Value Date   ALT 11 10/05/2020   AST 15 10/05/2020   ALKPHOS 66 10/05/2020   BILITOT 0.6 10/05/2020   The ASCVD Risk score Christian [name] Christian [name]., et al., 2013) failed to calculate for the following reasons:   The valid total cholesterol range is 130 to 320 mg/dL  Assessment/Plan:   Essential hypertension. Christian [name] is working on healthy weight loss and exercise to improve blood pressure control. We  will watch for signs of hypotension as he continues his lifestyle modifications. He will decrease salt in his diet.  Insulin resistance. Christian [name] will continue to work on weight loss, increasing exercise/activity, and decreasing simple carbohydrates to help decrease the risk of diabetes. Christian [name] agreed to follow-up with [name] as directed to closely monitor his progress.  Other hyperlipidemia. Cardiovascular risk and specific lipid/LDL goals reviewed.  We discussed several lifestyle modifications today and Christian [name] will continue to work on diet, exercise and weight loss efforts. Orders and follow up as documented in patient record. He will continue Crestor as directed.   Counseling Intensive lifestyle modifications are the first line treatment for this issue. . Dietary changes: Increase soluble fiber. Decrease simple carbohydrates. . Exercise changes: Moderate to vigorous-intensity aerobic activity 150 minutes per week if tolerated. . Lipid-lowering medications: see documented in medical record.  Class 1 obesity with serious comorbidity and body mass index (BMI) of 31.0 to 31.9 in adult, unspecified obesity type.   Christian [name] is currently in the action stage of change. As such, his goal is to continue with weight loss efforts. He has agreed to the Category 1 Plan + 100 calories.  He will work on meal planning, intentional eating, and adhering closely to the plan.   Exercise goals: Older adults should follow the adult guidelines. When older adults cannot meet the adult guidelines, they should be as physically active as their abilities and conditions will allow.  Behavioral modification strategies: increasing lean protein intake, decreasing simple carbohydrates, increasing vegetables, increasing water intake, decreasing eating out, no skipping meals, meal planning and cooking strategies, keeping healthy foods in the home and planning for success.  Christian [name] has agreed to follow-up with our clinic in 3-4  weeks. He was informed of the importance of frequent follow-up visits to maximize his success with intensive lifestyle modifications for his multiple health conditions.   Objective:   Blood pressure 125/84, pulse (!) 58, temperature (!) 97.5 F (36.4 C), height 5\' 10"  (1.778 m), weight 221 lb (100.2 kg), SpO2 98 %. Body mass index is 31.71 kg/m.  General: Cooperative, alert, well developed, in no acute distress. HEENT: Conjunctivae and lids unremarkable. Cardiovascular: Regular rhythm.  Lungs: Normal work of breathing. Neurologic: No focal deficits.   Lab Results  Component Value Date   CREATININE 1.18 10/05/2020   BUN 17 10/05/2020   NA 136 10/05/2020   K 5.0 10/05/2020   CL 100 10/05/2020   CO2 25 10/05/2020   Lab Results  Component Value Date   ALT 11 10/05/2020   AST 15 10/05/2020   ALKPHOS 66 10/05/2020   BILITOT 0.6 10/05/2020   Lab Results  Component Value Date   HGBA1C 5.4 06/24/2020   Lab Results  Component Value Date   INSULIN 6.8 10/05/2020   INSULIN 12.1 06/24/2020   Lab Results  Component Value Date   TSH 3.670 06/24/2020   Lab Results  Component Value Date   CHOL 111 10/05/2020   HDL 46 10/05/2020   LDLCALC 52 10/05/2020   TRIG 55 10/05/2020   CHOLHDL 2.4 06/15/2020   Lab Results  Component Value Date   WBC 7.4 12/06/2019   HGB 14.5 12/06/2019   HCT 42.8 12/06/2019   MCV 94 12/06/2019   PLT 223 12/06/2019   No results found for: IRON, TIBC, FERRITIN  Obesity Behavioral Intervention:   Approximately 15 minutes were spent on the discussion below.  ASK: We discussed the diagnosis of obesity with Christian [name] today and Christian [name] agreed to give [name] permission to discuss obesity behavioral modification therapy today.  ASSESS: Christian [name] has the diagnosis of obesity and his BMI today is 31.7. Christian [name] is in the action stage of change.   ADVISE: Christian [name] was educated on the multiple health risks of obesity as well as the benefit of weight loss to  improve his health. He was advised of the need for long term treatment and the importance of lifestyle modifications to improve his current health and to decrease his risk of future health problems.  AGREE: Multiple dietary modification options and treatment options were discussed and Christian [name] agreed to follow the recommendations documented in the above note.  ARRANGE: Christian [name] was educated on the importance of frequent visits to treat obesity as outlined per CMS and USPSTF guidelines and agreed to schedule his next follow up appointment today.  Attestation Statements:   Reviewed by clinician on day of visit: allergies, medications, problem list, medical history, surgical history, family history, social history, and previous encounter notes.  Migdalia Dk, am acting as Location manager for [employer], DO   I have reviewed the above documentation for accuracy and completeness, and I agree with the above. Christian [name] Lesch, DO

## 2020-11-21 ENCOUNTER — Encounter (INDEPENDENT_AMBULATORY_CARE_PROVIDER_SITE_OTHER): Payer: Self-pay | Admitting: Bariatrics

## 2020-12-02 DIAGNOSIS — M7062 Trochanteric bursitis, left hip: Secondary | ICD-10-CM | POA: Diagnosis not present

## 2020-12-08 DIAGNOSIS — M25552 Pain in left hip: Secondary | ICD-10-CM | POA: Diagnosis not present

## 2020-12-09 ENCOUNTER — Encounter (INDEPENDENT_AMBULATORY_CARE_PROVIDER_SITE_OTHER): Payer: Self-pay | Admitting: Bariatrics

## 2020-12-09 ENCOUNTER — Other Ambulatory Visit: Payer: Self-pay

## 2020-12-09 ENCOUNTER — Ambulatory Visit (INDEPENDENT_AMBULATORY_CARE_PROVIDER_SITE_OTHER): Payer: Medicare Other | Admitting: Bariatrics

## 2020-12-09 VITALS — BP 145/86 | HR 59 | Temp 97.5°F | Ht 70.0 in | Wt 212.0 lb

## 2020-12-09 DIAGNOSIS — M25569 Pain in unspecified knee: Secondary | ICD-10-CM | POA: Diagnosis not present

## 2020-12-09 DIAGNOSIS — E7849 Other hyperlipidemia: Secondary | ICD-10-CM

## 2020-12-09 DIAGNOSIS — E669 Obesity, unspecified: Secondary | ICD-10-CM

## 2020-12-09 DIAGNOSIS — Z6830 Body mass index (BMI) 30.0-30.9, adult: Secondary | ICD-10-CM

## 2020-12-09 NOTE — Progress Notes (Signed)
Chief Complaint:   OBESITY Christian [name] is here to discuss his progress with his obesity treatment plan along with follow-up of his obesity related diagnoses. Christian [name] is on the Category 1 Plan + 100 calories and states he is following his eating plan approximately 99.9% of the time. Christian [name] states he is walking 50 minutes 7 times per week.  Today's visit was #: 9 Starting weight: 278 lbs Starting date: 06/24/2020 Today's weight: 212 lbs Today's date: 12/09/2020 Total lbs lost to date: 63 Total lbs lost since last in-office visit: 9  Interim History: Christian [name] is down an additional 9 lbs and has done exceptionally well. He denies struggles with his eating. He has had left hip pain and has seen his PCP.  Subjective:   Other hyperlipidemia. Christian [name] is taking Crestor.   Lab Results  Component Value Date   CHOL 111 10/05/2020   HDL 46 10/05/2020   LDLCALC 52 10/05/2020   TRIG 55 10/05/2020   CHOLHDL 2.4 06/15/2020   Lab Results  Component Value Date   ALT 11 10/05/2020   AST 15 10/05/2020   ALKPHOS 66 10/05/2020   BILITOT 0.6 10/05/2020   The ASCVD Risk score Christian Bussing DC Jr., Christian al., 2013) failed to calculate for the following reasons:   The valid total cholesterol range is 130 to 320 mg/dL  Knee pain, unspecified chronicity, unspecified laterality. Christian [name] has knee pain increased with hip pain. He is status post left total knee replacement.  Assessment/Plan:   Other hyperlipidemia. Cardiovascular risk and specific lipid/LDL goals reviewed.  We discussed several lifestyle modifications today and Christian [name] will continue to work on diet, exercise and weight loss efforts. Orders and follow up as documented in patient record. He will continue Crestor as directed.   Counseling Intensive lifestyle modifications are the first line treatment for this issue. . Dietary changes: Increase soluble fiber. Decrease simple carbohydrates. . Exercise changes: Moderate to  vigorous-intensity aerobic activity 150 minutes per week if tolerated. . Lipid-lowering medications: see documented in medical record.  Knee pain, unspecified chronicity, unspecified laterality. Christian [name] was instructed to avoid pounding exercises.  Class 1 obesity with serious comorbidity and body mass index (BMI) of 30.0 to 30.9 in adult, unspecified obesity type.  Christian [name] is currently in the action stage of change. As such, his goal is to continue with weight loss efforts. He has agreed to the Category 1 Plan + 100 calories.   He will work on meal planning and keeping protein levels high.  Exercise goals: Christian [name] has left hip pain and had an injection last Wednesday per his orthopedist.  Behavioral modification strategies: increasing lean protein intake, decreasing simple carbohydrates, increasing vegetables, increasing water intake, dealing with family or coworker sabotage, travel eating strategies, holiday eating strategies , celebration eating strategies and planning for success.  Christian [name] has agreed to follow-up with our clinic the first week of February 2022. He was informed of the importance of frequent follow-up visits to maximize his success with intensive lifestyle modifications for his multiple health conditions.   Objective:   Blood pressure (!) 145/86, pulse (!) 59, temperature (!) 97.5 F (36.4 C), height 5\' 10"  (1.778 m), weight 212 lb (96.2 kg), SpO2 98 %. Body mass index is 30.42 kg/m.  General: Cooperative, alert, well developed, in no acute distress. HEENT: Conjunctivae and lids unremarkable. Cardiovascular: Regular rhythm.  Lungs: Normal work of breathing. Neurologic: No focal deficits.   Lab Results  Component Value Date   CREATININE 1.18 10/05/2020  BUN 17 10/05/2020   NA 136 10/05/2020   K 5.0 10/05/2020   CL 100 10/05/2020   CO2 25 10/05/2020   Lab Results  Component Value Date   ALT 11 10/05/2020   AST 15 10/05/2020   ALKPHOS 66 10/05/2020    BILITOT 0.6 10/05/2020   Lab Results  Component Value Date   HGBA1C 5.4 06/24/2020   Lab Results  Component Value Date   INSULIN 6.8 10/05/2020   INSULIN 12.1 06/24/2020   Lab Results  Component Value Date   TSH 3.670 06/24/2020   Lab Results  Component Value Date   CHOL 111 10/05/2020   HDL 46 10/05/2020   LDLCALC 52 10/05/2020   TRIG 55 10/05/2020   CHOLHDL 2.4 06/15/2020   Lab Results  Component Value Date   WBC 7.4 12/06/2019   HGB 14.5 12/06/2019   HCT 42.8 12/06/2019   MCV 94 12/06/2019   PLT 223 12/06/2019   No results found for: IRON, TIBC, FERRITIN  Obesity Behavioral Intervention:   Approximately 15 minutes were spent on the discussion below.  ASK: We discussed the diagnosis of obesity with Christian [name] today and Christian [name] agreed to give [name] permission to discuss obesity behavioral modification therapy today.  ASSESS: Christian [name] has the diagnosis of obesity and his BMI today is 30.4. [place] is in the action stage of change.   ADVISE: Christian [name] was educated on the multiple health risks of obesity as well as the benefit of weight loss to improve his health. He was advised of the need for long term treatment and the importance of lifestyle modifications to improve his current health and to decrease his risk of future health problems.  AGREE: Multiple dietary modification options and treatment options were discussed and Christian [name] agreed to follow the recommendations documented in the above note.  ARRANGE: Christian [name] was educated on the importance of frequent visits to treat obesity as outlined per CMS and USPSTF guidelines and agreed to schedule his next follow up appointment today.  Attestation Statements:   Reviewed by clinician on day of visit: allergies, medications, problem list, medical history, surgical history, family history, social history, and previous encounter notes.  Migdalia Dk, am acting as Location manager for [employer], DO   I have reviewed the  above documentation for accuracy and completeness, and I agree with the above. Jearld Lesch, DO

## 2020-12-16 ENCOUNTER — Ambulatory Visit (INDEPENDENT_AMBULATORY_CARE_PROVIDER_SITE_OTHER): Payer: Medicare Other

## 2020-12-16 ENCOUNTER — Other Ambulatory Visit: Payer: Self-pay

## 2020-12-16 ENCOUNTER — Ambulatory Visit (INDEPENDENT_AMBULATORY_CARE_PROVIDER_SITE_OTHER): Payer: Medicare Other | Admitting: Orthopaedic Surgery

## 2020-12-16 ENCOUNTER — Encounter: Payer: Self-pay | Admitting: Orthopaedic Surgery

## 2020-12-16 VITALS — Ht 71.0 in | Wt 208.0 lb

## 2020-12-16 DIAGNOSIS — M5442 Lumbago with sciatica, left side: Secondary | ICD-10-CM

## 2020-12-16 DIAGNOSIS — I2510 Atherosclerotic heart disease of native coronary artery without angina pectoris: Secondary | ICD-10-CM

## 2020-12-16 DIAGNOSIS — G8929 Other chronic pain: Secondary | ICD-10-CM | POA: Diagnosis not present

## 2020-12-16 DIAGNOSIS — M545 Low back pain, unspecified: Secondary | ICD-10-CM | POA: Insufficient documentation

## 2020-12-16 DIAGNOSIS — M79605 Pain in left leg: Secondary | ICD-10-CM

## 2020-12-16 MED ORDER — METHYLPREDNISOLONE 4 MG PO TBPK: ORAL_TABLET | ORAL | 0 refills | Status: DC

## 2020-12-16 NOTE — Progress Notes (Signed)
Office Visit Note   Patient: Christian [name]           Date of Birth: 10/16/1946           MRN: [id number] Visit Date: 12/16/2020              Requested by: Alroy Dust, L.Marlou Sa, [place] [address] [place],  [place] [address] PCP: Alroy Dust, L.Marlou Sa, MD   Assessment & Plan: Visit Diagnoses:  1. Pain in left leg   2. Chronic left-sided low back pain with left-sided sciatica     Plan: Christian [name] has been experiencing low back left buttock and occasional discomfort as far distally as his left calf and foot for about 3 weeks.  No injury or trauma.  He is tried muscle relaxants and over-the-counter medicine.  Has not had much relief.  His primary care physician also injected an area of tenderness in his buttock without any relief.  He does have significant degenerative disc disease at L5-S1 by films today and he is either having referred pain or he is having some nerve root irritation at that level with radiculopathy.  I will order an MRI scan of his lumbar spine and place him on a Medrol Dosepak as he has had some trouble sleeping  Follow-Up Instructions: Return After MRI scan lumbar spine.   Orders:  Orders Placed This Encounter  Procedures  . XR Lumbar Spine 2-3 Views  . MR Lumbar Spine w/o contrast   Meds ordered this encounter  Medications  . methylPREDNISolone (MEDROL DOSEPAK) 4 MG TBPK tablet    Sig: Take as directed on package.    Dispense:  21 tablet    Refill:  0      Procedures: No procedures performed   Clinical Data: No additional findings.   Subjective: Chief Complaint  Patient presents with  . Left Leg - Pain  Patient presents today for left leg pain. He said that he developed pain in his left buttock area 3 weeks ago. No known injury. He said that the pain travels down posteriorly and laterally into his left leg. He has experienced weakness in his left leg and has fallen since the pain started. He has difficulty sleeping at night. His PCP gave him  Tizanidine to take and cannot tell that it helps.   HPI  Review of Systems   Objective: Vital Signs: Ht 5\' 11"  (1.803 m)   Wt 208 lb (94.3 kg)   BMI 29.01 kg/m   Physical Exam Constitutional:      Appearance: He is well-developed and well-nourished.  HENT:     Mouth/Throat:     Mouth: Oropharynx is clear and moist.  Eyes:     Extraocular Movements: EOM normal.     Pupils: Pupils are equal, round, and reactive to light.  Pulmonary:     Effort: Pulmonary effort is normal.  Skin:    General: Skin is warm and dry.  Neurological:     Mental Status: He is alert and oriented to person, place, and time.  Psychiatric:        Mood and Affect: Mood and affect normal.        Behavior: Behavior normal.     Ortho Exam awake alert and oriented x3.  Comfortable sitting.  Straight leg raise negative bilaterally.  Painless range of motion of both hips.  Has had a prior right total hip replacement.  Also had bilateral knee replacements without any problem.  I thought his left knee  reflex was decreased compared to the right ankle reflexes were symmetrical.  Motor exam intact.  He had some mild discomfort in the area of the lumbosacral spine and in the area of the left SI joint.  No buttock pain no pain over the lateral aspect of his hip  Specialty Comments:  No specialty comments available.  Imaging: XR Lumbar Spine 2-3 Views  Result Date: 12/16/2020 Films of the lumbar spine were obtained in 2 projections.  There is significant degenerative disc disease at L5-S1 with narrowing of the the disc space and osteophytes directed both posteriorly and anteriorly.  There is minimal scoliosis.  No evidence of a listhesis.  There is facet joint sclerosis at L5-S1.  The remainder of the lumbar spine looks relatively clear    PMFS History: Patient Active Problem List   Diagnosis Date Noted  . Low back pain 12/16/2020  . Primary osteoarthritis of left knee 02/05/2019  . History of left knee  replacement 02/05/2019  . PVC's (premature ventricular contractions)   . Dyspnea 12/06/2016  . Osteoarthritis of right hip 09/22/2015  . S/P total hip arthroplasty 09/22/2015  . Obesity 05/08/2014  . Osteoarthritis of knee 05/08/2014  . S/P total knee replacement using cement 05/06/2014  . Knee pain 04/08/2014  . Preoperative clearance 04/08/2014  . Coronary artery disease   . Diastolic dysfunction   . Hypertension   . Orthostatic hypotension   . Hypercholesterolemia    Past Medical History:  Diagnosis Date  . Arthritis    "joints; shoulders; left elbow; right thumb" (05/06/2014)  . Atrial tachycardia ([facility])   . Breathing problem   . Cataracts, bilateral    immature  . Chest pain   . Constipation    takes Colace and Metamucil daily  . Coronary artery disease 02/2011   cath 11/2016 showing 20% RCA, 50% OM, 35% mid LCx, 70% D1, 65% mid LAD, 70% distal LAD 80% on medical management  . Diastolic dysfunction   . GERD (gastroesophageal reflux disease)   . Heart murmur   . Hemorrhoids   . History of bronchitis   . Hypercholesterolemia    takes Crestor daily  . Hypertension   . Joint pain   . Joint pain   . Nocturia   . Orthostatic hypotension   . Palpitations   . Premature atrial contractions   . PVC's (premature ventricular contractions)   . Restless leg syndrome   . Rheumatoid arthritis ([facility])   . Sciatica   . Sinus bradycardia     Family History  Problem Relation Age of Onset  . CVA Mother   . Stroke Mother   . CVA Father   . Heart disease Father   . High blood pressure Father   . High Cholesterol Father   . Stroke Father     Past Surgical History:  Procedure Laterality Date  . ACHILLES TENDON REPAIR Left    "& fixed a spur"  . CARDIAC CATHETERIZATION  2012  . CARDIAC CATHETERIZATION N/A 12/06/2016   Procedure: Left Heart Cath and Coronary Angiography;  Surgeon: Belva Crome, MD;  Location: [facility];  Service: Cardiovascular;  Laterality: N/A;  .  COLONOSCOPY    . FLEXIBLE SIGMOIDOSCOPY  X 2  . HAND SURGERY Left    "thumb; cleaned out arthritis"  . HEEL SPUR EXCISION Left   . KNEE ARTHROSCOPY Right   . SHOULDER SURGERY Right X 2   "cleaned out spurs"  . TOE FUSION Right    great  toe; plates and screws  . TONSILLECTOMY  ~ 1950  . TOTAL HIP ARTHROPLASTY Right 09/22/2015   Procedure: TOTAL HIP ARTHROPLASTY;  Surgeon: Valeria Batman, MD;  Location: [facility] OR;  Service: Orthopedics;  Laterality: Right;  . TOTAL KNEE ARTHROPLASTY Right 05/06/2014  . TOTAL KNEE ARTHROPLASTY Right 05/06/2014   Procedure: RIGHT TOTAL KNEE ARTHROPLASTY;  Surgeon: Valeria Batman, MD;  Location: [facility] OR;  Service: Orthopedics;  Laterality: Right;  . TOTAL KNEE ARTHROPLASTY Left 02/05/2019   Procedure: LEFT TOTAL KNEE ARTHROPLASTY;  Surgeon: Valeria Batman, MD;  Location: [facility] OR;  Service: Orthopedics;  Laterality: Left;   Social History   Occupational History  . Occupation: Retired  Tobacco Use  . Smoking status: Former Smoker    Packs/day: 2.00    Years: 10.00    Pack years: 20.00    Types: Cigarettes    Quit date: 1981    Years since quitting: 41.0  . Smokeless tobacco: Never Used  . Tobacco comment: quit smoking in 1981  Vaping Use  . Vaping Use: Never used  Substance and Sexual Activity  . Alcohol use: Yes    Comment: "drink a beer sometimes once/month"  . Drug use: No  . Sexual activity: Never

## 2020-12-17 ENCOUNTER — Ambulatory Visit: Payer: Medicare Other | Admitting: Orthopaedic Surgery

## 2020-12-22 ENCOUNTER — Encounter: Payer: Self-pay | Admitting: Orthopaedic Surgery

## 2020-12-24 ENCOUNTER — Other Ambulatory Visit: Payer: Self-pay | Admitting: Orthopedic Surgery

## 2020-12-24 MED ORDER — HYDROCODONE-ACETAMINOPHEN 5-325 MG PO TABS: 1.0000 | ORAL_TABLET | Freq: Four times a day (QID) | ORAL | 0 refills | Status: AC | PRN

## 2021-01-04 ENCOUNTER — Encounter: Payer: Self-pay | Admitting: Orthopaedic Surgery

## 2021-01-04 ENCOUNTER — Other Ambulatory Visit: Payer: Self-pay | Admitting: Orthopaedic Surgery

## 2021-01-04 MED ORDER — HYDROCODONE-ACETAMINOPHEN 5-325 MG PO TABS: 1.0000 | ORAL_TABLET | Freq: Four times a day (QID) | ORAL | 0 refills | Status: DC | PRN

## 2021-01-12 ENCOUNTER — Other Ambulatory Visit: Payer: Medicare Other

## 2021-01-15 ENCOUNTER — Encounter: Payer: Self-pay | Admitting: Orthopaedic Surgery

## 2021-01-15 ENCOUNTER — Other Ambulatory Visit: Payer: Self-pay | Admitting: Orthopaedic Surgery

## 2021-01-15 MED ORDER — TRAMADOL HCL 50 MG PO TABS: 50.0000 mg | ORAL_TABLET | Freq: Two times a day (BID) | ORAL | 1 refills | Status: DC | PRN

## 2021-01-15 NOTE — Telephone Encounter (Signed)
sent 

## 2021-01-25 ENCOUNTER — Ambulatory Visit
Admission: RE | Admit: 2021-01-25 | Discharge: 2021-01-25 | Disposition: A | Discharge status: Home / Self Care | Payer: Medicare Other | Source: Ambulatory Visit | Attending: Orthopaedic Surgery | Admitting: Orthopaedic Surgery

## 2021-01-25 ENCOUNTER — Other Ambulatory Visit: Payer: Self-pay

## 2021-01-25 DIAGNOSIS — M48061 Spinal stenosis, lumbar region without neurogenic claudication: Secondary | ICD-10-CM | POA: Diagnosis not present

## 2021-01-25 DIAGNOSIS — M79605 Pain in left leg: Secondary | ICD-10-CM

## 2021-01-25 DIAGNOSIS — M545 Low back pain, unspecified: Secondary | ICD-10-CM | POA: Diagnosis not present

## 2021-01-26 ENCOUNTER — Ambulatory Visit (INDEPENDENT_AMBULATORY_CARE_PROVIDER_SITE_OTHER): Payer: Medicare Other | Admitting: Bariatrics

## 2021-01-26 ENCOUNTER — Other Ambulatory Visit: Payer: Self-pay

## 2021-01-26 ENCOUNTER — Encounter (INDEPENDENT_AMBULATORY_CARE_PROVIDER_SITE_OTHER): Payer: Self-pay | Admitting: Bariatrics

## 2021-01-26 ENCOUNTER — Ambulatory Visit: Payer: Medicare Other | Admitting: Orthopaedic Surgery

## 2021-01-26 VITALS — BP 144/90 | HR 60 | Temp 97.4°F | Ht 70.0 in | Wt 202.0 lb

## 2021-01-26 DIAGNOSIS — R7309 Other abnormal glucose: Secondary | ICD-10-CM

## 2021-01-26 DIAGNOSIS — E7849 Other hyperlipidemia: Secondary | ICD-10-CM

## 2021-01-26 DIAGNOSIS — I5032 Chronic diastolic (congestive) heart failure: Secondary | ICD-10-CM | POA: Insufficient documentation

## 2021-01-26 DIAGNOSIS — E559 Vitamin D deficiency, unspecified: Secondary | ICD-10-CM | POA: Diagnosis not present

## 2021-01-26 DIAGNOSIS — I208 Other forms of angina pectoris: Secondary | ICD-10-CM | POA: Insufficient documentation

## 2021-01-26 DIAGNOSIS — I10 Essential (primary) hypertension: Secondary | ICD-10-CM

## 2021-01-26 DIAGNOSIS — E669 Obesity, unspecified: Secondary | ICD-10-CM

## 2021-01-26 DIAGNOSIS — Z6830 Body mass index (BMI) 30.0-30.9, adult: Secondary | ICD-10-CM | POA: Diagnosis not present

## 2021-01-26 NOTE — Progress Notes (Signed)
Chief Complaint:   OBESITY Christian [name] is here to discuss his progress with his obesity treatment plan along with follow-up of his obesity related diagnoses. Christian [name] is on the Category 1 Plan +100 calories and states he is following his eating plan approximately 95% of the time. Christian [name] states he is walking for 45-60 minutes 6 times per week.  Today's visit was #: 10 Starting weight: 278 lbs Starting date: 06/24/2020 Today's weight: 202 lbs Today's date: 01/26/2021 Total lbs lost to date: 76 lbs Total lbs lost since last in-office visit: 10 lbs  Interim History: Christian [name] is down 10 pounds since his last visit and doing well overall.  He is doing okay with protein.  He has been having pain in his left leg.  His goal is about 198 pounds.  Subjective:   1. Other hyperlipidemia Christian [name] has hyperlipidemia and has been trying to improve his cholesterol levels with intensive lifestyle modification including a low saturated fat diet, exercise and weight loss. He denies any chest pain, claudication or myalgias.  Taking Crestor as directed.  Lab Results  Component Value Date   ALT 11 10/05/2020   AST 15 10/05/2020   ALKPHOS 66 10/05/2020   BILITOT 0.6 10/05/2020   Lab Results  Component Value Date   CHOL 111 10/05/2020   HDL 46 10/05/2020   LDLCALC 52 10/05/2020   TRIG 55 10/05/2020   CHOLHDL 2.4 06/15/2020   2. Essential hypertension Slightly elevated today.  Review: taking medications as instructed, no medication side effects noted, no chest pain on exertion, no dyspnea on exertion, no swelling of ankles.   BP Readings from Last 3 Encounters:  01/26/21 (!) 144/90  12/09/20 (!) 145/86  11/17/20 125/84   3. Vitamin D deficiency Christian [name]'s Vitamin D level was 55.0 on 10/05/2020. He is currently taking no vitamin D supplement.   4. Elevated glucose He denies polyphagia.  Assessment/Plan:   1. Other hyperlipidemia Cardiovascular risk and specific lipid/LDL goals reviewed.  We  discussed several lifestyle modifications today and Christian [name] will continue to work on diet, exercise and weight loss efforts. Orders and follow up as documented in patient record.  Continue medication.  Will check CMP today.  Counseling Intensive lifestyle modifications are the first line treatment for this issue. . Dietary changes: Increase soluble fiber. Decrease simple carbohydrates. . Exercise changes: Moderate to vigorous-intensity aerobic activity 150 minutes per week if tolerated. . Lipid-lowering medications: see documented in medical record.  - Comprehensive metabolic panel  2. Essential hypertension Christian [name] is working on healthy weight loss and exercise to improve blood pressure control. We will watch for signs of hypotension as he continues his lifestyle modifications.  Continue medication.   3. Vitamin D deficiency Low Vitamin D level contributes to fatigue and are associated with obesity, breast, and colon cancer. Will check vitamin D level today.  - VITAMIN D 25 Hydroxy (Vit-D Deficiency, Fractures)  4. Elevated glucose Will check fasting insulin level today, as per below.  - Insulin, random  5. Class 1 obesity with serious comorbidity and body mass index (BMI) of 30.0 to 30.9 in adult, unspecified obesity type  Christian [name] is currently in the action stage of change. As such, his goal is to continue with weight loss efforts. He has agreed to the Category 1 Plan +100 calories.   He will work on meal planning and mindful eating.  Goal weight is 198 pounds and will talk about maintenance.  Exercise goals: Will begin weights and will follow-up with orthopedist  for pinched nerve.  Behavioral modification strategies: increasing lean protein intake, decreasing simple carbohydrates, increasing vegetables, increasing water intake, decreasing eating out, no skipping meals, meal planning and cooking strategies, keeping healthy foods in the home, ways to avoid boredom eating, ways to  avoid night time snacking and better snacking choices.  [name] has agreed to follow-up with our clinic in 2-3 weeks. He was informed of the importance of frequent follow-up visits to maximize his success with intensive lifestyle modifications for his multiple health conditions.   Christian [name] was informed we would discuss his lab results at his next visit unless there is a critical issue that needs to be addressed sooner. Christian [name] agreed to keep his next visit at the agreed upon time to discuss these results.  Objective:   Blood pressure (!) 144/90, pulse 60, temperature (!) 97.4 F (36.3 C), height 5\' 10"  (1.778 m), weight 202 lb (91.6 kg), SpO2 99 %. Body mass index is 28.98 kg/m.  General: Cooperative, alert, well developed, in no acute distress. HEENT: Conjunctivae and lids unremarkable. Cardiovascular: Regular rhythm.  Lungs: Normal work of breathing. Neurologic: No focal deficits.   Lab Results  Component Value Date   CREATININE 1.18 10/05/2020   BUN 17 10/05/2020   NA 136 10/05/2020   K 5.0 10/05/2020   CL 100 10/05/2020   CO2 25 10/05/2020   Lab Results  Component Value Date   ALT 11 10/05/2020   AST 15 10/05/2020   ALKPHOS 66 10/05/2020   BILITOT 0.6 10/05/2020   Lab Results  Component Value Date   HGBA1C 5.4 06/24/2020   Lab Results  Component Value Date   INSULIN 6.8 10/05/2020   INSULIN 12.1 06/24/2020   Lab Results  Component Value Date   TSH 3.670 06/24/2020   Lab Results  Component Value Date   CHOL 111 10/05/2020   HDL 46 10/05/2020   LDLCALC 52 10/05/2020   TRIG 55 10/05/2020   CHOLHDL 2.4 06/15/2020   Lab Results  Component Value Date   WBC 7.4 12/06/2019   HGB 14.5 12/06/2019   HCT 42.8 12/06/2019   MCV 94 12/06/2019   PLT 223 12/06/2019   Obesity Behavioral Intervention:   Approximately 15 minutes were spent on the discussion below.  ASK: We discussed the diagnosis of obesity with [name] today and [name] agreed to give [name]  permission to discuss obesity behavioral modification therapy today.  ASSESS: [name] has the diagnosis of obesity and his BMI today is 29.1. [name] is in the action stage of change.   ADVISE: Trung was educated on the multiple health risks of obesity as well as the benefit of weight loss to improve his health. He was advised of the need for long term treatment and the importance of lifestyle modifications to improve his current health and to decrease his risk of future health problems.  AGREE: Multiple dietary modification options and treatment options were discussed and Obed agreed to follow the recommendations documented in the above note.  ARRANGE: Chi was educated on the importance of frequent visits to treat obesity as outlined per CMS and USPSTF guidelines and agreed to schedule his next follow up appointment today.  Attestation Statements:   Reviewed by clinician on day of visit: allergies, medications, problem list, medical history, surgical history, family history, social history, and previous encounter notes.  I, Water quality scientist, CMA, am acting as Location manager for [employer], DO  I have reviewed the above documentation for accuracy and completeness, and I agree with the above. -  Jearld Lesch, DO

## 2021-01-27 ENCOUNTER — Encounter (INDEPENDENT_AMBULATORY_CARE_PROVIDER_SITE_OTHER): Payer: Self-pay | Admitting: Bariatrics

## 2021-01-27 ENCOUNTER — Encounter: Payer: Self-pay | Admitting: Orthopaedic Surgery

## 2021-01-27 ENCOUNTER — Ambulatory Visit (INDEPENDENT_AMBULATORY_CARE_PROVIDER_SITE_OTHER): Payer: Medicare Other | Admitting: Orthopaedic Surgery

## 2021-01-27 VITALS — Ht 70.0 in | Wt 202.0 lb

## 2021-01-27 DIAGNOSIS — M5442 Lumbago with sciatica, left side: Secondary | ICD-10-CM

## 2021-01-27 DIAGNOSIS — G8929 Other chronic pain: Secondary | ICD-10-CM

## 2021-01-27 LAB — COMPREHENSIVE METABOLIC PANEL
ALT: 9 IU/L (ref 0–44)
AST: 17 IU/L (ref 0–40)
Albumin/Globulin Ratio: 2 (ref 1.2–2.2)
Albumin: 4 g/dL (ref 3.7–4.7)
Alkaline Phosphatase: 59 IU/L (ref 44–121)
BUN/Creatinine Ratio: 12 (ref 10–24)
BUN: 12 mg/dL (ref 8–27)
Bilirubin Total: 0.6 mg/dL (ref 0.0–1.2)
CO2: 26 mmol/L (ref 20–29)
Calcium: 9 mg/dL (ref 8.6–10.2)
Chloride: 101 mmol/L (ref 96–106)
Creatinine, Ser: 0.99 mg/dL (ref 0.76–1.27)
GFR calc Af Amer: 87 mL/min/{1.73_m2} (ref >59)
GFR calc non Af Amer: 75 mL/min/{1.73_m2} (ref >59)
Globulin, Total: 2 g/dL (ref 1.5–4.5)
Glucose: 82 mg/dL (ref 65–99)
Potassium: 4.4 mmol/L (ref 3.5–5.2)
Sodium: 137 mmol/L (ref 134–144)
Total Protein: 6 g/dL (ref 6.0–8.5)

## 2021-01-27 LAB — INSULIN, RANDOM: INSULIN: 4.4 u[IU]/mL (ref 2.6–24.9)

## 2021-01-27 LAB — VITAMIN D 25 HYDROXY (VIT D DEFICIENCY, FRACTURES): Vit D, 25-Hydroxy: 36.6 ng/mL (ref 30.0–100.0)

## 2021-01-27 NOTE — Progress Notes (Signed)
Office Visit Note   Patient: Christian [name]           Date of Birth: 05-21-1946           MRN: [id number] Visit Date: 01/27/2021              Requested by: Alroy Dust, L.Marlou Sa, [place] [address] [place],  [place] [address] PCP: Alroy Dust, L.Marlou Sa, MD   Assessment & Plan: Visit Diagnoses:  1. Chronic left-sided low back pain with left-sided sciatica     Plan: Mr. Kimple had an MRI scan of his lumbar spine demonstrating disc extrusion at L2-3 resulting in left lateral recess stenosis and left L3 nerve root impingement.  There was mild left neural foraminal stenosis at L4-5.  He is feeling somewhat better but still having some referred pain and radicular discomfort into his left lower extremity associated with his back pain.  Exam was relatively benign without evidence of neurologic deficit.  We will asked Dr. Ernestina Patches to evaluate for possible epidural steroid injection and check him back in a month.  Has been taking tramadol for pain but "using less"  Follow-Up Instructions: Return in about 1 month (around 02/24/2021).   Orders:  Orders Placed This Encounter  Procedures   Ambulatory referral to Physical Medicine Rehab   No orders of the defined types were placed in this encounter.     Procedures: No procedures performed   Clinical Data: No additional findings.   Subjective: Chief Complaint  Patient presents with   Lower Back - Follow-up    MRI review  Patient presents today for follow up on his lower back. He had an MRI performed on 01/25/2021 and is here today for those results.  Actually feeling a little bit better in terms of his pain.  Still having some referred pain along the anterior aspect of his left thigh.  No bowel or bladder changes.  Has not noticed any weakness of either lower extremity  HPI  Review of Systems   Objective: Vital Signs: Ht 5\' 10"  (1.778 m)    Wt 202 lb (91.6 kg)    BMI 28.98 kg/m   Physical Exam Constitutional:      Appearance:  He is well-developed and well-nourished.  HENT:     Mouth/Throat:     Mouth: Oropharynx is clear and moist.  Eyes:     Extraocular Movements: EOM normal.     Pupils: Pupils are equal, round, and reactive to light.  Pulmonary:     Effort: Pulmonary effort is normal.  Skin:    General: Skin is warm and dry.  Neurological:     Mental Status: He is alert and oriented to person, place, and time.  Psychiatric:        Mood and Affect: Mood and affect normal.        Behavior: Behavior normal.     Ortho Exam awake alert and oriented x3.  Comfortable sitting.  Straight leg raise negative bilaterally.  Reflexes were depressed but equal bilaterally.  Good strength.  Sensory exam intact.  No percussible tenderness of lumbar spine  Specialty Comments:  No specialty comments available.  Imaging: No results found.   PMFS History: Patient Active Problem List   Diagnosis Date Noted   Chronic diastolic heart failure ([facility]) 01/26/2021   Other forms of angina pectoris ([place]) 01/26/2021   Low back pain 12/16/2020   Primary osteoarthritis of left knee 02/05/2019   History of left knee replacement 02/05/2019   PVC's (  premature ventricular contractions)    Dyspnea 12/06/2016   Osteoarthritis of right hip 09/22/2015   S/P total hip arthroplasty 09/22/2015   Obesity 05/08/2014   Osteoarthritis of knee 05/08/2014   S/P total knee replacement using cement 05/06/2014   Knee pain 04/08/2014   Preoperative clearance 04/08/2014   Coronary artery disease    Diastolic dysfunction    Hypertension    Orthostatic hypotension    Hypercholesterolemia    Past Medical History:  Diagnosis Date   Arthritis    "joints; shoulders; left elbow; right thumb" (05/06/2014)   Atrial tachycardia ([facility])    Breathing problem    Cataracts, bilateral    immature   Chest pain    Constipation    takes Colace and Metamucil daily   Coronary artery disease 02/2011   cath 11/2016 showing 20%  RCA, 50% OM, 35% mid LCx, 70% D1, 65% mid LAD, 70% distal LAD 80% on medical management   Diastolic dysfunction    GERD (gastroesophageal reflux disease)    Heart murmur    Hemorrhoids    History of bronchitis    Hypercholesterolemia    takes Crestor daily   Hypertension    Joint pain    Joint pain    Nocturia    Orthostatic hypotension    Palpitations    Premature atrial contractions    PVC's (premature ventricular contractions)    Restless leg syndrome    Rheumatoid arthritis ([place])    Sciatica    Sinus bradycardia     Family History  Problem Relation Age of Onset   CVA Mother    Stroke Mother    CVA Father    Heart disease Father    High blood pressure Father    High Cholesterol Father    Stroke Father     Past Surgical History:  Procedure Laterality Date   ACHILLES TENDON REPAIR Left    "& fixed a spur"   CARDIAC CATHETERIZATION  2012   CARDIAC CATHETERIZATION N/A 12/06/2016   Procedure: Left Heart Cath and Coronary Angiography;  Surgeon: Belva Crome, MD;  Location: [facility];  Service: Cardiovascular;  Laterality: N/A;   COLONOSCOPY     FLEXIBLE SIGMOIDOSCOPY  X 2   HAND SURGERY Left    "thumb; cleaned out arthritis"   HEEL SPUR EXCISION Left    KNEE ARTHROSCOPY Right    SHOULDER SURGERY Right X 2   "cleaned out spurs"   TOE FUSION Right    great toe; plates and screws   TONSILLECTOMY  ~ [place] Right 09/22/2015   Procedure: TOTAL HIP ARTHROPLASTY;  Surgeon: Garald Balding, MD;  Location: [facility];  Service: Orthopedics;  Laterality: Right;   TOTAL KNEE ARTHROPLASTY Right 05/06/2014   TOTAL KNEE ARTHROPLASTY Right 05/06/2014   Procedure: RIGHT TOTAL KNEE ARTHROPLASTY;  Surgeon: Garald Balding, MD;  Location: [facility];  Service: Orthopedics;  Laterality: Right;   TOTAL KNEE ARTHROPLASTY Left 02/05/2019   Procedure: LEFT TOTAL KNEE ARTHROPLASTY;  Surgeon: Garald Balding, MD;  Location: [facility];  Service: Orthopedics;  Laterality: Left;   Social History   Occupational History   Occupation: Retired  Tobacco Use   Smoking status: Former Smoker    Packs/day: 2.00    Years: 10.00    Pack years: 20.00    Types: Cigarettes    Quit date: 1981    Years since quitting: 41.1   Smokeless tobacco: Never Used   Tobacco  comment: quit smoking in 1981  Vaping Use   Vaping Use: Never used  Substance and Sexual Activity   Alcohol use: Yes    Comment: "drink a beer sometimes once/month"   Drug use: No   Sexual activity: Never

## 2021-01-28 ENCOUNTER — Encounter: Payer: Self-pay | Admitting: Orthopaedic Surgery

## 2021-01-28 ENCOUNTER — Ambulatory Visit: Payer: Medicare Other | Admitting: Orthopaedic Surgery

## 2021-02-09 ENCOUNTER — Other Ambulatory Visit: Payer: Self-pay

## 2021-02-09 ENCOUNTER — Encounter (INDEPENDENT_AMBULATORY_CARE_PROVIDER_SITE_OTHER): Payer: Self-pay | Admitting: Bariatrics

## 2021-02-09 ENCOUNTER — Ambulatory Visit (INDEPENDENT_AMBULATORY_CARE_PROVIDER_SITE_OTHER): Payer: Medicare Other | Admitting: Bariatrics

## 2021-02-09 VITALS — BP 137/89 | HR 68 | Temp 97.4°F | Ht 70.0 in | Wt 199.0 lb

## 2021-02-09 DIAGNOSIS — E6609 Other obesity due to excess calories: Secondary | ICD-10-CM | POA: Diagnosis not present

## 2021-02-09 DIAGNOSIS — Z6830 Body mass index (BMI) 30.0-30.9, adult: Secondary | ICD-10-CM | POA: Diagnosis not present

## 2021-02-09 DIAGNOSIS — I10 Essential (primary) hypertension: Secondary | ICD-10-CM | POA: Diagnosis not present

## 2021-02-09 DIAGNOSIS — E559 Vitamin D deficiency, unspecified: Secondary | ICD-10-CM | POA: Diagnosis not present

## 2021-02-10 NOTE — Progress Notes (Signed)
Chief Complaint:   OBESITY Christian [name] is here to discuss his progress with his obesity treatment plan along with follow-up of his obesity related diagnoses. Christian [name] is on the Category 1 Plan + 100 calories and states he is following his eating plan approximately 95% of the time. Christian [name] states he is walking for 50 minutes 6-7 times per week.  Today's visit was #: 11 Starting weight: 278 lbs Starting date: 06/24/2020 Today's weight: 199 lbs Today's date: 02/09/2021 Total lbs lost to date: 79 Total lbs lost since last in-office visit: 3  Interim History: Christian [name] is down 3 lbs, and he is following the plan closely. He started some protein shakes.  Subjective:   1. Primary hypertension Christian [name]'s blood pressure is reasonably well controlled.  2. Vitamin D insufficiency Christian [name] is not taking Vit D, and last Vit D level was 36.6.  Assessment/Plan:   1. Primary hypertension Christian [name] will continue his medications, and will continue working on healthy weight loss and exercise to improve blood pressure control. We will watch for signs of hypotension as he continues his lifestyle modifications.  2. Vitamin D insufficiency Low Vitamin D level contributes to fatigue and are associated with obesity, breast, and colon cancer. Christian [name] agreed to start OTC Vitamin D 2,000 IU daily and will follow-up for routine testing of Vitamin D, at least 2-3 times per year to avoid over-replacement.  3. Class 1 obesity due to excess calories with serious comorbidity and body mass index (BMI) of 30.0 to 30.9 in adult Christian [name] is currently in the action stage of change. As such, his goal is to continue with weight loss efforts. He has agreed to the Category 1 Plan + 100 calories.   Christian [name] will adhere to the plan. I reviewed labs independently with the patient from 01/26/2021.  We will recheck fasting labs and IC at his next visit.  Exercise goals: As is.  Behavioral modification strategies: increasing lean  protein intake, decreasing simple carbohydrates, increasing vegetables, increasing water intake, decreasing eating out, no skipping meals, meal planning and cooking strategies, keeping healthy foods in the home and planning for success.  Christian [name] has agreed to follow-up with our clinic in 4 weeks. He was informed of the importance of frequent follow-up visits to maximize his success with intensive lifestyle modifications for his multiple health conditions.   Objective:   Blood pressure 137/89, pulse 68, temperature (!) 97.4 F (36.3 C), height 5\' 10"  (1.778 m), weight 199 lb (90.3 kg), SpO2 99 %. Body mass index is 28.55 kg/m.  General: Cooperative, alert, well developed, in no acute distress. HEENT: Conjunctivae and lids unremarkable. Cardiovascular: Regular rhythm.  Lungs: Normal work of breathing. Neurologic: No focal deficits.   Lab Results  Component Value Date   CREATININE 0.99 01/26/2021   BUN 12 01/26/2021   NA 137 01/26/2021   K 4.4 01/26/2021   CL 101 01/26/2021   CO2 26 01/26/2021   Lab Results  Component Value Date   ALT 9 01/26/2021   AST 17 01/26/2021   ALKPHOS 59 01/26/2021   BILITOT 0.6 01/26/2021   Lab Results  Component Value Date   HGBA1C 5.4 06/24/2020   Lab Results  Component Value Date   INSULIN 4.4 01/26/2021   INSULIN 6.8 10/05/2020   INSULIN 12.1 06/24/2020   Lab Results  Component Value Date   TSH 3.670 06/24/2020   Lab Results  Component Value Date   CHOL 111 10/05/2020   HDL 46 10/05/2020   LDLCALC 52 10/05/2020  TRIG 55 10/05/2020   CHOLHDL 2.4 06/15/2020   Lab Results  Component Value Date   WBC 7.4 12/06/2019   HGB 14.5 12/06/2019   HCT 42.8 12/06/2019   MCV 94 12/06/2019   PLT 223 12/06/2019   No results found for: IRON, TIBC, FERRITIN  Obesity Behavioral Intervention:   Approximately 15 minutes were spent on the discussion below.  ASK: We discussed the diagnosis of obesity with Christian [name] today and Christian [name] agreed to  give [name] permission to discuss obesity behavioral modification therapy today.  ASSESS: Christian [name] has the diagnosis of obesity and his BMI today is 28.55. Christian [name] is in the action stage of change.   ADVISE: Christian [name] was educated on the multiple health risks of obesity as well as the benefit of weight loss to improve his health. He was advised of the need for long term treatment and the importance of lifestyle modifications to improve his current health and to decrease his risk of future health problems.  AGREE: Multiple dietary modification options and treatment options were discussed and Christian [name] agreed to follow the recommendations documented in the above note.  ARRANGE: Christian [name] was educated on the importance of frequent visits to treat obesity as outlined per CMS and USPSTF guidelines and agreed to schedule his next follow up appointment today.  Attestation Statements:   Reviewed by clinician on day of visit: allergies, medications, problem list, medical history, surgical history, family history, social history, and previous encounter notes.   Wilhemena Durie, am acting as Location manager for [employer], DO.  I have reviewed the above documentation for accuracy and completeness, and I agree with the above. Jearld Lesch, DO

## 2021-02-14 ENCOUNTER — Encounter (INDEPENDENT_AMBULATORY_CARE_PROVIDER_SITE_OTHER): Payer: Self-pay | Admitting: Bariatrics

## 2021-02-15 ENCOUNTER — Ambulatory Visit (INDEPENDENT_AMBULATORY_CARE_PROVIDER_SITE_OTHER): Payer: Medicare Other | Admitting: Physical Medicine and Rehabilitation

## 2021-02-15 ENCOUNTER — Ambulatory Visit: Payer: Self-pay

## 2021-02-15 ENCOUNTER — Encounter: Payer: Self-pay | Admitting: Physical Medicine and Rehabilitation

## 2021-02-15 ENCOUNTER — Other Ambulatory Visit: Payer: Self-pay

## 2021-02-15 VITALS — BP 116/80 | HR 67

## 2021-02-15 DIAGNOSIS — G2581 Restless legs syndrome: Secondary | ICD-10-CM | POA: Insufficient documentation

## 2021-02-15 DIAGNOSIS — H35039 Hypertensive retinopathy, unspecified eye: Secondary | ICD-10-CM | POA: Insufficient documentation

## 2021-02-15 DIAGNOSIS — K5900 Constipation, unspecified: Secondary | ICD-10-CM | POA: Insufficient documentation

## 2021-02-15 DIAGNOSIS — M5416 Radiculopathy, lumbar region: Secondary | ICD-10-CM

## 2021-02-15 MED ORDER — DEXAMETHASONE SODIUM PHOSPHATE 10 MG/ML IJ SOLN
15.0000 mg | Freq: Once | INTRAMUSCULAR | Status: AC
  Administered 2021-02-15: 15 mg

## 2021-02-15 NOTE — Procedures (Signed)
Lumbosacral Transforaminal Epidural Steroid Injection - Sub-Pedicular Approach with Fluoroscopic Guidance  Patient: Christian [name]      Date of Birth: 1946/06/23 MRN: [id number] PCP: Alroy Dust, L.Marlou Sa, MD      Visit Date: 02/15/2021   Universal Protocol:    Date/Time: 02/15/2021  Consent Given By: the patient  Position: PRONE  Additional Comments: Vital signs were monitored before and after the procedure. Patient was prepped and draped in the usual sterile fashion. The correct patient, procedure, and site was verified.   Injection Procedure Details:   Procedure diagnoses: Lumbar radiculopathy [M54.16]    Meds Administered:  Meds ordered this encounter  Medications  . dexamethasone (DECADRON) injection 15 mg    Laterality: Left  Location/Site:  L3-L4  Needle:5.0 in., 22 ga.  Short bevel or Quincke spinal needle  Needle Placement: Transforaminal  Findings:    -Comments: Excellent flow of contrast along the nerve, nerve root and into the epidural space.  Procedure Details: After squaring off the end-plates to get a true AP view, the C-arm was positioned so that an oblique view of the foramen as noted above was visualized. The target area is just inferior to the "nose of the scotty dog" or sub pedicular. The soft tissues overlying this structure were infiltrated with 2-3 ml. of 1% Lidocaine without Epinephrine.  The spinal needle was inserted toward the target using a "trajectory" view along the fluoroscope beam.  Under AP and lateral visualization, the needle was advanced so it did not puncture dura and was located close the 6 O'Clock position of the pedical in AP tracterory. Biplanar projections were used to confirm position. Aspiration was confirmed to be negative for CSF and/or blood. A 1-2 ml. volume of Isovue-250 was injected and flow of contrast was noted at each level. Radiographs were obtained for documentation purposes.   After attaining the desired flow of  contrast documented above, a 0.5 to 1.0 ml test dose of 0.25% Marcaine was injected into each respective transforaminal space.  The patient was observed for 90 seconds post injection.  After no sensory deficits were reported, and normal lower extremity motor function was noted,   the above injectate was administered so that equal amounts of the injectate were placed at each foramen (level) into the transforaminal epidural space.   Additional Comments:  The patient tolerated the procedure well Dressing: 2 x 2 sterile gauze and Band-Aid    Post-procedure details: Patient was observed during the procedure. Post-procedure instructions were reviewed.  Patient left the clinic in stable condition.

## 2021-02-15 NOTE — Progress Notes (Signed)
Pt state lower back pain that travels down to his left leg. Pt state at night he feel his pain in the left inner thigh that travels to his knee. Pt state he take pain meds to help ease the pain.  Numeric Pain Rating Scale and Functional Assessment Average Pain 2   In the last MONTH (on 0-10 scale) has pain interfered with the following?  1. General activity like being  able to carry out your everyday physical activities such as walking, climbing stairs, carrying groceries, or moving a chair?  Rating(6)   +Driver, -BT, -Dye Allergies.

## 2021-02-15 NOTE — Patient Instructions (Signed)

## 2021-02-15 NOTE — Progress Notes (Signed)
Christian [name] - 75 y.o. male MRN [id number]  Date of birth: [birth year]  Office Visit Note: Visit Date: 02/15/2021 PCP: Alroy Dust, L.Marlou Sa, MD Referred by: Alroy Dust, L.Marlou Sa, MD  Subjective: Chief Complaint  Patient presents with  . Lower Back - Pain  . Left Knee - Pain  . Left Leg - Pain   HPI:  Christian [name] is a 75 y.o. male who comes in today at the request of Dr. [name] for planned Left L3-L4 Lumbar epidural steroid injection with fluoroscopic guidance.  The patient has failed conservative care including home exercise, medications, time and activity modification.  This injection will be diagnostic and hopefully therapeutic.  Please see requesting physician notes for further details and justification.  MRI reviewed with images and spine model.  MRI reviewed in the note below.    ROS Otherwise per HPI.  Assessment & Plan: Visit Diagnoses:    ICD-10-CM   1. Lumbar radiculopathy  M54.16 XR C-ARM NO REPORT    Epidural Steroid injection    dexamethasone (DECADRON) injection 15 mg    Plan: No additional findings.   Meds & Orders:  Meds ordered this encounter  Medications  . dexamethasone (DECADRON) injection 15 mg    Orders Placed This Encounter  Procedures  . XR C-ARM NO REPORT  . Epidural Steroid injection    Follow-up: Return if symptoms worsen or fail to improve.   Procedures: No procedures performed  Lumbosacral Transforaminal Epidural Steroid Injection - Sub-Pedicular Approach with Fluoroscopic Guidance  Patient: Christian [name]      Date of Birth: 07/31/1946 MRN: [id number] PCP: Alroy Dust, L.Marlou Sa, MD      Visit Date: 02/15/2021   Universal Protocol:    Date/Time: 02/15/2021  Consent Given By: the patient  Position: PRONE  Additional Comments: Vital signs were monitored before and after the procedure. Patient was prepped and draped in the usual sterile fashion. The correct patient, procedure, and site was verified.   Injection Procedure  Details:   Procedure diagnoses: Lumbar radiculopathy [M54.16]    Meds Administered:  Meds ordered this encounter  Medications  . dexamethasone (DECADRON) injection 15 mg    Laterality: Left  Location/Site:  L3-L4  Needle:5.0 in., 22 ga.  Short bevel or Quincke spinal needle  Needle Placement: Transforaminal  Findings:    -Comments: Excellent flow of contrast along the nerve, nerve root and into the epidural space.  Procedure Details: After squaring off the end-plates to get a true AP view, the C-arm was positioned so that an oblique view of the foramen as noted above was visualized. The target area is just inferior to the "nose of the scotty dog" or sub pedicular. The soft tissues overlying this structure were infiltrated with 2-3 ml. of 1% Lidocaine without Epinephrine.  The spinal needle was inserted toward the target using a "trajectory" view along the fluoroscope beam.  Under AP and lateral visualization, the needle was advanced so it did not puncture dura and was located close the 6 O'Clock position of the pedical in AP tracterory. Biplanar projections were used to confirm position. Aspiration was confirmed to be negative for CSF and/or blood. A 1-2 ml. volume of Isovue-250 was injected and flow of contrast was noted at each level. Radiographs were obtained for documentation purposes.   After attaining the desired flow of contrast documented above, a 0.5 to 1.0 ml test dose of 0.25% Marcaine was injected into each respective transforaminal space.  The patient was observed for 90 seconds post injection.  After no sensory deficits were reported, and normal lower extremity motor function was noted,   the above injectate was administered so that equal amounts of the injectate were placed at each foramen (level) into the transforaminal epidural space.   Additional Comments:  The patient tolerated the procedure well Dressing: 2 x 2 sterile gauze and Band-Aid    Post-procedure  details: Patient was observed during the procedure. Post-procedure instructions were reviewed.  Patient left the clinic in stable condition.      Clinical History: MRI LUMBAR SPINE WITHOUT CONTRAST  TECHNIQUE: Multiplanar, multisequence MR imaging of the lumbar spine was performed. No intravenous contrast was administered.  COMPARISON:  Lumbar spine radiographs 12/16/2020  FINDINGS: Segmentation:  Standard.  Alignment:  Normal.  Vertebrae: Mild edema in the region of the posterior pedicle/pars interarticularis on the right at L5, potentially reactive as a discrete fracture is not identified. Preserved vertebral body heights. No suspicious marrow lesion.  Conus medullaris and cauda equina: Conus extends to the T12-L1 level. Conus and cauda equina appear normal.  Paraspinal and other soft tissues: Partially visualized renal cysts including a 5.5 cm cyst on the right and numerous smaller cysts bilaterally.  Disc levels:  Disc desiccation throughout the lumbar spine. Moderate disc space narrowing at L5-S1.  T12-L1: Mild facet and ligamentum flavum hypertrophy without disc herniation or stenosis.  L1-2: Minimal disc bulging and mild facet and ligamentum flavum hypertrophy without stenosis.  L2-3: Circumferential disc bulging, a small left paracentral and subarticular disc extrusion with migration to the inferior L3 vertebral body level, and moderate facet and ligamentum flavum hypertrophy result in mild spinal stenosis and mild right and moderate left lateral recess stenosis with the disc extrusion likely resulting in left L3 nerve root impingement. Patent neural foramina.  L3-4: Mild disc bulging, small right central disc protrusion, and moderate facet and ligamentum flavum hypertrophy without significant stenosis.  L4-5: Minimal disc bulging, a small left foraminal disc protrusion, and mild-to-moderate facet and ligamentum flavum hypertrophy  result in mild left neural foraminal stenosis without spinal stenosis.  L5-S1: Mild disc bulging and a shallow central disc protrusion with annular fissure without stenosis.  IMPRESSION: 1. Disc extrusion at L2-3 resulting in left lateral recess stenosis and left L3 nerve root impingement. 2. Mild left neural foraminal stenosis at L4-5.   Electronically Signed   By: Logan Bores M.D.   On: 01/25/2021 15:00     Objective:  VS:  HT:    WT:   BMI:     BP:116/80  HR:67bpm  TEMP: ( )  RESP:  Physical Exam Vitals and nursing note reviewed.  Constitutional:      General: He is not in acute distress.    Appearance: Normal appearance. He is not ill-appearing.  HENT:     Head: Normocephalic and atraumatic.     Right Ear: External ear normal.     Left Ear: External ear normal.     Nose: No congestion.  Eyes:     Extraocular Movements: Extraocular movements intact.  Cardiovascular:     Rate and Rhythm: Normal rate.     Pulses: Normal pulses.  Pulmonary:     Effort: Pulmonary effort is normal. No respiratory distress.  Abdominal:     General: There is no distension.     Palpations: Abdomen is soft.  Musculoskeletal:        General: No tenderness or signs of injury.     Cervical back: Neck supple.     Right lower leg:  No edema.     Left lower leg: No edema.     Comments: Patient has good distal strength without clonus.  Skin:    Findings: No erythema or rash.  Neurological:     General: No focal deficit present.     Mental Status: He is alert and oriented to person, place, and time.     Sensory: No sensory deficit.     Motor: No weakness or abnormal muscle tone.     Coordination: Coordination normal.  Psychiatric:        Mood and Affect: Mood normal.        Behavior: Behavior normal.      Imaging: No results found.

## 2021-02-18 DIAGNOSIS — C44529 Squamous cell carcinoma of skin of other part of trunk: Secondary | ICD-10-CM | POA: Diagnosis not present

## 2021-02-18 DIAGNOSIS — L821 Other seborrheic keratosis: Secondary | ICD-10-CM | POA: Diagnosis not present

## 2021-02-18 DIAGNOSIS — S90221A Contusion of right lesser toe(s) with damage to nail, initial encounter: Secondary | ICD-10-CM | POA: Diagnosis not present

## 2021-02-18 DIAGNOSIS — L578 Other skin changes due to chronic exposure to nonionizing radiation: Secondary | ICD-10-CM | POA: Diagnosis not present

## 2021-02-18 DIAGNOSIS — L570 Actinic keratosis: Secondary | ICD-10-CM | POA: Diagnosis not present

## 2021-02-18 DIAGNOSIS — D485 Neoplasm of uncertain behavior of skin: Secondary | ICD-10-CM | POA: Diagnosis not present

## 2021-02-18 DIAGNOSIS — D1801 Hemangioma of skin and subcutaneous tissue: Secondary | ICD-10-CM | POA: Diagnosis not present

## 2021-02-18 DIAGNOSIS — L814 Other melanin hyperpigmentation: Secondary | ICD-10-CM | POA: Diagnosis not present

## 2021-02-25 ENCOUNTER — Encounter: Payer: Self-pay | Admitting: Physical Medicine and Rehabilitation

## 2021-02-28 ENCOUNTER — Other Ambulatory Visit: Payer: Self-pay | Admitting: Cardiology

## 2021-02-28 DIAGNOSIS — E7800 Pure hypercholesterolemia, unspecified: Secondary | ICD-10-CM

## 2021-02-28 DIAGNOSIS — I10 Essential (primary) hypertension: Secondary | ICD-10-CM

## 2021-03-10 ENCOUNTER — Ambulatory Visit (INDEPENDENT_AMBULATORY_CARE_PROVIDER_SITE_OTHER): Payer: Medicare Other | Admitting: Bariatrics

## 2021-03-10 ENCOUNTER — Other Ambulatory Visit: Payer: Self-pay

## 2021-03-10 ENCOUNTER — Encounter (INDEPENDENT_AMBULATORY_CARE_PROVIDER_SITE_OTHER): Payer: Self-pay | Admitting: Bariatrics

## 2021-03-10 VITALS — BP 138/88 | HR 64 | Temp 97.6°F | Ht 70.0 in | Wt 199.0 lb

## 2021-03-10 DIAGNOSIS — I10 Essential (primary) hypertension: Secondary | ICD-10-CM

## 2021-03-10 DIAGNOSIS — E7849 Other hyperlipidemia: Secondary | ICD-10-CM | POA: Diagnosis not present

## 2021-03-10 DIAGNOSIS — R5383 Other fatigue: Secondary | ICD-10-CM | POA: Diagnosis not present

## 2021-03-10 DIAGNOSIS — E6609 Other obesity due to excess calories: Secondary | ICD-10-CM | POA: Diagnosis not present

## 2021-03-10 DIAGNOSIS — Z6830 Body mass index (BMI) 30.0-30.9, adult: Secondary | ICD-10-CM | POA: Diagnosis not present

## 2021-03-10 DIAGNOSIS — R0602 Shortness of breath: Secondary | ICD-10-CM | POA: Diagnosis not present

## 2021-03-16 NOTE — Progress Notes (Signed)
Chief Complaint:   OBESITY Christian [name] is here to discuss his progress with his obesity treatment plan along with follow-up of his obesity related diagnoses. Christian [name] is on the Category 1 Plan + 100 calories and states he is following his eating plan approximately 95% of the time. Christian [name] states he is walking for 50 minutes 6 times per week.  Today's visit was #: 12 Starting weight: 278 lbs Starting date: 06/24/2020 Today's weight: 199 lbs Today's date: 03/10/2021 Total lbs lost to date: 79 Total lbs lost since last in-office visit: 0  Interim History: Christian [name]'s weight remains the same.  Subjective:   1. SOB (shortness of breath) on exertion Christian [name] notes increasing shortness of breath with exercising. Last RMR was 1590 in 05/2020, and today his RMR is 1901 which is up 311 points.  2. Other fatigue Christian [name] notes fatigue. Last RMR was 1590 in 05/2020, and today his RMR is 1901 which is up 311 points.  3. Other hyperlipidemia Christian [name] is taking Crestor currently.  4. Essential hypertension Christian [name]'s hypertension is controlled, but his blood pressure is slightly elevated today.  Assessment/Plan:   1. SOB (shortness of breath) on exertion IC was repeated today. Finbar has agreed to work on weight loss and gradually increase exercise to treat his exercise induced shortness of breath. Will continue to monitor closely.  2. Other fatigue IC was repeated today. Christian [name] will focus on self care including making healthy food choices, increasing physical activity and focusing on stress reduction.  3. Other hyperlipidemia Cardiovascular risk and specific lipid/LDL goals reviewed. We discussed several lifestyle modifications today. Christian [name] continue taking Crestor, and will continue to work on diet, exercise and weight loss efforts. Orders and follow up as documented in patient record.   Counseling Intensive lifestyle modifications are the first line treatment for this issue.  Dietary  changes: Increase soluble fiber. Decrease simple carbohydrates.  Exercise changes: Moderate to vigorous-intensity aerobic activity 150 minutes per week if tolerated.  Lipid-lowering medications: see documented in medical record.  4. Essential hypertension Christian [name] will continue his medications, and will continue working on healthy weight loss and exercise to improve blood pressure control. We will watch for signs of hypotension as he continues his lifestyle modifications.  5. Class 1 obesity due to excess calories with serious comorbidity and body mass index (BMI) of 30.0 to 30.9 in adult Christian [name] is currently in the action stage of change. As such, his goal is to continue with weight loss efforts. He has agreed to the Category 3 Plan.   Christian [name] will continue to do meal planning. Change from Category 1 to Category 3 in line of increasing RMR.   Exercise goals: As is, and will start hand weights.  Behavioral modification strategies: increasing lean protein intake, decreasing simple carbohydrates, increasing vegetables, increasing water intake, decreasing eating out, no skipping meals, meal planning and cooking strategies and keeping healthy foods in the home.  Christian [name] has agreed to follow-up with our clinic in 4 weeks. He was informed of the importance of frequent follow-up visits to maximize his success with intensive lifestyle modifications for his multiple health conditions.   Objective:   Blood pressure 138/88, pulse 64, temperature 97.6 F (36.4 C), height 5\' 10"  (1.778 m), weight 199 lb (90.3 kg), SpO2 98 %. Body mass index is 28.55 kg/m.  General: Cooperative, alert, well developed, in no acute distress. HEENT: Conjunctivae and lids unremarkable. Cardiovascular: Regular rhythm.  Lungs: Normal work of breathing. Neurologic: No focal deficits.   Lab Results  Component Value Date   CREATININE 0.99 01/26/2021   BUN 12 01/26/2021   NA 137 01/26/2021   K 4.4 01/26/2021   CL 101  01/26/2021   CO2 26 01/26/2021   Lab Results  Component Value Date   ALT 9 01/26/2021   AST 17 01/26/2021   ALKPHOS 59 01/26/2021   BILITOT 0.6 01/26/2021   Lab Results  Component Value Date   HGBA1C 5.4 06/24/2020   Lab Results  Component Value Date   INSULIN 4.4 01/26/2021   INSULIN 6.8 10/05/2020   INSULIN 12.1 06/24/2020   Lab Results  Component Value Date   TSH 3.670 06/24/2020   Lab Results  Component Value Date   CHOL 111 10/05/2020   HDL 46 10/05/2020   LDLCALC 52 10/05/2020   TRIG 55 10/05/2020   CHOLHDL 2.4 06/15/2020   Lab Results  Component Value Date   WBC 7.4 12/06/2019   HGB 14.5 12/06/2019   HCT 42.8 12/06/2019   MCV 94 12/06/2019   PLT 223 12/06/2019   No results found for: IRON, TIBC, FERRITIN  Obesity Behavioral Intervention:   Approximately 15 minutes were spent on the discussion below.  ASK: We discussed the diagnosis of obesity with Christian [name] today and Christian [name] agreed to give [name] permission to discuss obesity behavioral modification therapy today.  ASSESS: Christian [name] has the diagnosis of obesity and his BMI today is 28.55. Christian [name] is in the action stage of change.   ADVISE: Christian [name] was educated on the multiple health risks of obesity as well as the benefit of weight loss to improve his health. He was advised of the need for long term treatment and the importance of lifestyle modifications to improve his current health and to decrease his risk of future health problems.  AGREE: Multiple dietary modification options and treatment options were discussed and Christian [name] agreed to follow the recommendations documented in the above note.  ARRANGE: Christian [name] was educated on the importance of frequent visits to treat obesity as outlined per CMS and USPSTF guidelines and agreed to schedule his next follow up appointment today.  Attestation Statements:   Reviewed by clinician on day of visit: allergies, medications, problem list, medical history, surgical  history, family history, social history, and previous encounter notes.   Wilhemena Durie, am acting as Location manager for [employer], DO.  I have reviewed the above documentation for accuracy and completeness, and I agree with the above. Jearld Lesch, DO

## 2021-03-18 ENCOUNTER — Encounter: Payer: Self-pay | Admitting: Orthopaedic Surgery

## 2021-03-18 ENCOUNTER — Ambulatory Visit (INDEPENDENT_AMBULATORY_CARE_PROVIDER_SITE_OTHER): Payer: Medicare Other | Admitting: Orthopaedic Surgery

## 2021-03-18 ENCOUNTER — Other Ambulatory Visit: Payer: Self-pay

## 2021-03-18 VITALS — Ht 70.0 in | Wt 199.0 lb

## 2021-03-18 DIAGNOSIS — G8929 Other chronic pain: Secondary | ICD-10-CM | POA: Diagnosis not present

## 2021-03-18 DIAGNOSIS — M5442 Lumbago with sciatica, left side: Secondary | ICD-10-CM

## 2021-03-18 NOTE — Progress Notes (Signed)
Office Visit Note   Patient: Christian [name]           Date of Birth: [birth year]           MRN: [id number] Visit Date: 03/18/2021              Requested by: Alroy Dust, L.Marlou Sa, [place] [employer] [place],  [place] [id number] PCP: Alroy Dust, L.Marlou Sa, MD   Assessment & Plan: Visit Diagnoses:  1. Chronic left-sided low back pain with left-sided sciatica     Plan: Christian [name] is accompanied by his wife and here for follow-up evaluation of his chronic low back pain associated with left lower extremity radiculopathy.  He did have an MRI scan performed in January demonstrating circumferential disc bulging and a small left paracentral and subarticular disc extrusion with migration to the inferior L3 vertebral body.  There was moderate facet and ligamentum flavum hypertrophy with mild spinal stenosis.  Another level of potential concern was at L4-5 with there was minimal disc bulging and a small left foraminal disc protrusion and mild to moderate facet and ligamentum flavum hypertrophy resulting in mild left neural foraminal stenosis without spinal stenosis.  He had an epidural steroid injection by Dr. Ernestina Patches a month ago and is not sure which made a significant difference.  He is not having any bowel or bladder changes and is not having any pain referable to the right lower extremity.  I thought his right knee reflex was normal and on the left it was decreased.  Both ankle reflexes were symmetrical.  Long discussion with Christian [name] and his wife.  I am going to have Dr. Ernestina Patches consider injecting the L2-3 level on the left and also consult Dr. Louanne Skye for consideration of surgery.  Christian [name] agrees.  Follow-Up Instructions: Return Will refer to Dr. Louanne Skye and follow-up with Dr. Ernestina Patches.   Orders:  Orders Placed This Encounter  Procedures  . Ambulatory referral to Orthopedic Surgery  . Ambulatory referral to Physical Medicine Rehab   No orders of the defined types were placed in this  encounter.     Procedures: No procedures performed   Clinical Data: No additional findings.   Subjective: Chief Complaint  Patient presents with  . Lower Back - Pain  Patient presents today for lower back and left leg pain. He saw Dr.Newton one month ago and received an injection. He thought at first the injection helped, but now states that he does not think so. He has nerve pain radiating down his left leg. He states that his leg feels weak at times with weightbearing and almost falls. He takes Ibuprofen for pain relief.  Not having any bowel or bladder changes.  Did have some constipation while taking tramadol.  No problems referable to the right lower extremity.  No numbness  HPI  Review of Systems   Objective: Vital Signs: Ht 5\' 10"  (1.778 m)   Wt 199 lb (90.3 kg)   BMI 28.55 kg/m   Physical Exam Constitutional:      Appearance: He is well-developed.  Eyes:     Pupils: Pupils are equal, round, and reactive to light.  Pulmonary:     Effort: Pulmonary effort is normal.  Skin:    General: Skin is warm and dry.  Neurological:     Mental Status: He is alert and oriented to person, place, and time.  Psychiatric:        Behavior: Behavior normal.     Ortho Exam awake alert  and comfortable.  No problems sitting.  Walks without a limp.  No distress.  Straight leg raise is negative bilaterally.  Left knee reflex was minimal and +2 on the right knee.  Ankle reflexes were depressed bilaterally.  Has normal sensation subjectively and appears to have excellent strength in his left lower extremity.  No percussible tenderness of the lumbar spine  Specialty Comments:  No specialty comments available.  Imaging: No results found.   PMFS History: Patient Active Problem List   Diagnosis Date Noted  . Restless legs 02/15/2021  . Hypertensive retinopathy 02/15/2021  . Constipation 02/15/2021  . Chronic diastolic heart failure ([place]) 01/26/2021  . Other forms of angina  pectoris ([place]) 01/26/2021  . Low back pain 12/16/2020  . Primary osteoarthritis of left knee 02/05/2019  . History of left knee replacement 02/05/2019  . PVC's (premature ventricular contractions)   . Dyspnea 12/06/2016  . Osteoarthritis of right hip 09/22/2015  . S/P total hip arthroplasty 09/22/2015  . Obesity 05/08/2014  . Osteoarthritis of knee 05/08/2014  . S/P total knee replacement using cement 05/06/2014  . Knee pain 04/08/2014  . Preoperative clearance 04/08/2014  . Coronary artery disease   . Diastolic dysfunction   . Hypertension   . Orthostatic hypotension   . Hypercholesterolemia    Past Medical History:  Diagnosis Date  . Arthritis    "joints; shoulders; left elbow; right thumb" (05/06/2014)  . Atrial tachycardia ([place])   . Breathing problem   . Cataracts, bilateral    immature  . Chest pain   . Constipation    takes Colace and Metamucil daily  . Coronary artery disease 02/2011   cath 11/2016 showing 20% RCA, 50% OM, 35% mid LCx, 70% D1, 65% mid LAD, 70% distal LAD 80% on medical management  . Diastolic dysfunction   . GERD (gastroesophageal reflux disease)   . Heart murmur   . Hemorrhoids   . History of bronchitis   . Hypercholesterolemia    takes Crestor daily  . Hypertension   . Joint pain   . Joint pain   . Nocturia   . Orthostatic hypotension   . Palpitations   . Premature atrial contractions   . PVC's (premature ventricular contractions)   . Restless leg syndrome   . Rheumatoid arthritis ([place])   . Sciatica   . Sinus bradycardia     Family History  Problem Relation Age of Onset  . CVA Mother   . Stroke Mother   . CVA Father   . Heart disease Father   . High blood pressure Father   . High Cholesterol Father   . Stroke Father     Past Surgical History:  Procedure Laterality Date  . ACHILLES TENDON REPAIR Left    "& fixed a spur"  . CARDIAC CATHETERIZATION  2012  . CARDIAC CATHETERIZATION N/A 12/06/2016   Procedure: Left Heart Cath and  Coronary Angiography;  Surgeon: Belva Crome, MD;  Location: [facility];  Service: Cardiovascular;  Laterality: N/A;  . COLONOSCOPY    . FLEXIBLE SIGMOIDOSCOPY  X 2  . HAND SURGERY Left    "thumb; cleaned out arthritis"  . HEEL SPUR EXCISION Left   . KNEE ARTHROSCOPY Right   . SHOULDER SURGERY Right X 2   "cleaned out spurs"  . TOE FUSION Right    great toe; plates and screws  . TONSILLECTOMY  ~ 1950  . TOTAL HIP ARTHROPLASTY Right 09/22/2015   Procedure: TOTAL HIP ARTHROPLASTY;  Surgeon:  Garald Balding, MD;  Location: [facility];  Service: Orthopedics;  Laterality: Right;  . TOTAL KNEE ARTHROPLASTY Right 05/06/2014  . TOTAL KNEE ARTHROPLASTY Right 05/06/2014   Procedure: RIGHT TOTAL KNEE ARTHROPLASTY;  Surgeon: Garald Balding, MD;  Location: [facility];  Service: Orthopedics;  Laterality: Right;  . TOTAL KNEE ARTHROPLASTY Left 02/05/2019   Procedure: LEFT TOTAL KNEE ARTHROPLASTY;  Surgeon: Garald Balding, MD;  Location: [facility];  Service: Orthopedics;  Laterality: Left;   Social History   Occupational History  . Occupation: Retired  Tobacco Use  . Smoking status: Former Smoker    Packs/day: 2.00    Years: 10.00    Pack years: 20.00    Types: Cigarettes    Quit date: 1981    Years since quitting: 41.2  . Smokeless tobacco: Never Used  . Tobacco comment: quit smoking in 1981  Vaping Use  . Vaping Use: Never used  Substance and Sexual Activity  . Alcohol use: Yes    Comment: "drink a beer sometimes once/month"  . Drug use: No  . Sexual activity: Never

## 2021-03-19 ENCOUNTER — Telehealth: Payer: Self-pay

## 2021-03-19 NOTE — Telephone Encounter (Signed)
error 

## 2021-03-24 ENCOUNTER — Other Ambulatory Visit: Payer: Self-pay

## 2021-03-24 ENCOUNTER — Ambulatory Visit: Payer: Self-pay

## 2021-03-24 ENCOUNTER — Ambulatory Visit (INDEPENDENT_AMBULATORY_CARE_PROVIDER_SITE_OTHER): Payer: Medicare Other | Admitting: Physical Medicine and Rehabilitation

## 2021-03-24 ENCOUNTER — Ambulatory Visit (INDEPENDENT_AMBULATORY_CARE_PROVIDER_SITE_OTHER): Payer: Medicare Other | Admitting: Specialist

## 2021-03-24 ENCOUNTER — Encounter: Payer: Self-pay | Admitting: Physical Medicine and Rehabilitation

## 2021-03-24 ENCOUNTER — Encounter: Payer: Self-pay | Admitting: Specialist

## 2021-03-24 VITALS — BP 116/73 | HR 67 | Ht 70.0 in | Wt 199.0 lb

## 2021-03-24 VITALS — BP 138/88 | HR 71

## 2021-03-24 DIAGNOSIS — M5416 Radiculopathy, lumbar region: Secondary | ICD-10-CM

## 2021-03-24 DIAGNOSIS — M5442 Lumbago with sciatica, left side: Secondary | ICD-10-CM

## 2021-03-24 DIAGNOSIS — M5116 Intervertebral disc disorders with radiculopathy, lumbar region: Secondary | ICD-10-CM

## 2021-03-24 DIAGNOSIS — G8929 Other chronic pain: Secondary | ICD-10-CM

## 2021-03-24 MED ORDER — METHYLPREDNISOLONE ACETATE 80 MG/ML IJ SUSP
80.0000 mg | Freq: Once | INTRAMUSCULAR | Status: AC
  Administered 2021-03-24: 80 mg

## 2021-03-24 NOTE — Progress Notes (Signed)
Christian [name] - 75 y.o. male MRN [id number]  Date of birth: Aug 31, 1946  Office Visit Note: Visit Date: 03/24/2021 PCP: Alroy Dust, L.Marlou Sa, MD Referred by: Alroy Dust, L.Marlou Sa, MD  Subjective: Chief Complaint  Patient presents with  . Lower Back - Pain  . Left Leg - Pain   HPI:  Christian [name] is a 75 y.o. male who comes in today for planned repeat Left L3-L4 Lumbar epidural steroid injection with fluoroscopic guidance.  The patient has failed conservative care including home exercise, medications, time and activity modification.  This injection will be diagnostic and hopefully therapeutic.  Please see requesting physician notes for further details and justification. Patient received more than 50% pain relief from prior injection.   He actually saw Dr. Louanne Skye today in consultation for her spine.  Dr. Louanne Skye does want to repeat the L3 transforaminal injection according to the referral note.  We had already have the patient scheduled for L2 transforaminal injection but I agree repeating the L3 because the patient did get more than 50% relief diagnostically is a good plan.  Referring: Dr. Basil Dess    ROS Otherwise per HPI.  Assessment & Plan: Visit Diagnoses:    ICD-10-CM   1. Lumbar radiculopathy  M54.16 XR C-ARM NO REPORT    Epidural Steroid injection    methylPREDNISolone acetate (DEPO-MEDROL) injection 80 mg    Plan: No additional findings.   Meds & Orders:  Meds ordered this encounter  Medications  . methylPREDNISolone acetate (DEPO-MEDROL) injection 80 mg    Orders Placed This Encounter  Procedures  . XR C-ARM NO REPORT  . Epidural Steroid injection    Follow-up: Return if symptoms worsen or fail to improve.   Procedures: No procedures performed  Lumbosacral Transforaminal Epidural Steroid Injection - Sub-Pedicular Approach with Fluoroscopic Guidance  Patient: Christian [name]      Date of Birth: [birth year] MRN: [id number] PCP: Alroy Dust, L.Marlou Sa, MD      Visit Date:  03/24/2021   Universal Protocol:    Date/Time: 03/24/2021  Consent Given By: the patient  Position: PRONE  Additional Comments: Vital signs were monitored before and after the procedure. Patient was prepped and draped in the usual sterile fashion. The correct patient, procedure, and site was verified.   Injection Procedure Details:   Procedure diagnoses: Lumbar radiculopathy [M54.16]    Meds Administered:  Meds ordered this encounter  Medications  . methylPREDNISolone acetate (DEPO-MEDROL) injection 80 mg    Laterality: Left  Location/Site:  L3-L4  Needle:5.0 in., 22 ga.  Short bevel or Quincke spinal needle  Needle Placement: Transforaminal  Findings:    -Comments: Excellent flow of contrast along the nerve, nerve root and into the epidural space.  Procedure Details: After squaring off the end-plates to get a true AP view, the C-arm was positioned so that an oblique view of the foramen as noted above was visualized. The target area is just inferior to the "nose of the scotty dog" or sub pedicular. The soft tissues overlying this structure were infiltrated with 2-3 ml. of 1% Lidocaine without Epinephrine.  The spinal needle was inserted toward the target using a "trajectory" view along the fluoroscope beam.  Under AP and lateral visualization, the needle was advanced so it did not puncture dura and was located close the 6 O'Clock position of the pedical in AP tracterory. Biplanar projections were used to confirm position. Aspiration was confirmed to be negative for CSF and/or blood. A 1-2 ml. volume of Isovue-250 was injected and  flow of contrast was noted at each level. Radiographs were obtained for documentation purposes.   After attaining the desired flow of contrast documented above, a 0.5 to 1.0 ml test dose of 0.25% Marcaine was injected into each respective transforaminal space.  The patient was observed for 90 seconds post injection.  After no sensory deficits were  reported, and normal lower extremity motor function was noted,   the above injectate was administered so that equal amounts of the injectate were placed at each foramen (level) into the transforaminal epidural space.   Additional Comments:  The patient tolerated the procedure well Dressing: 2 x 2 sterile gauze and Band-Aid    Post-procedure details: Patient was observed during the procedure. Post-procedure instructions were reviewed.  Patient left the clinic in stable condition.      Clinical History: MRI LUMBAR SPINE WITHOUT CONTRAST  TECHNIQUE: Multiplanar, multisequence MR imaging of the lumbar spine was performed. No intravenous contrast was administered.  COMPARISON:  Lumbar spine radiographs 12/16/2020  FINDINGS: Segmentation:  Standard.  Alignment:  Normal.  Vertebrae: Mild edema in the region of the posterior pedicle/pars interarticularis on the right at L5, potentially reactive as a discrete fracture is not identified. Preserved vertebral body heights. No suspicious marrow lesion.  Conus medullaris and cauda equina: Conus extends to the T12-L1 level. Conus and cauda equina appear normal.  Paraspinal and other soft tissues: Partially visualized renal cysts including a 5.5 cm cyst on the right and numerous smaller cysts bilaterally.  Disc levels:  Disc desiccation throughout the lumbar spine. Moderate disc space narrowing at L5-S1.  T12-L1: Mild facet and ligamentum flavum hypertrophy without disc herniation or stenosis.  L1-2: Minimal disc bulging and mild facet and ligamentum flavum hypertrophy without stenosis.  L2-3: Circumferential disc bulging, a small left paracentral and subarticular disc extrusion with migration to the inferior L3 vertebral body level, and moderate facet and ligamentum flavum hypertrophy result in mild spinal stenosis and mild right and moderate left lateral recess stenosis with the disc extrusion likely resulting  in left L3 nerve root impingement. Patent neural foramina.  L3-4: Mild disc bulging, small right central disc protrusion, and moderate facet and ligamentum flavum hypertrophy without significant stenosis.  L4-5: Minimal disc bulging, a small left foraminal disc protrusion, and mild-to-moderate facet and ligamentum flavum hypertrophy result in mild left neural foraminal stenosis without spinal stenosis.  L5-S1: Mild disc bulging and a shallow central disc protrusion with annular fissure without stenosis.  IMPRESSION: 1. Disc extrusion at L2-3 resulting in left lateral recess stenosis and left L3 nerve root impingement. 2. Mild left neural foraminal stenosis at L4-5.   Electronically Signed   By: Logan Bores M.D.   On: 01/25/2021 15:00     Objective:  VS:  HT:    WT:   BMI:     BP:138/88  HR:71bpm  TEMP: ( )  RESP:  Physical Exam Vitals and nursing note reviewed.  Constitutional:      General: He is not in acute distress.    Appearance: Normal appearance. He is not ill-appearing.  HENT:     Head: Normocephalic and atraumatic.     Right Ear: External ear normal.     Left Ear: External ear normal.     Nose: No congestion.  Eyes:     Extraocular Movements: Extraocular movements intact.  Cardiovascular:     Rate and Rhythm: Normal rate.     Pulses: Normal pulses.  Pulmonary:     Effort: Pulmonary effort is normal.  No respiratory distress.  Abdominal:     General: There is no distension.     Palpations: Abdomen is soft.  Musculoskeletal:        General: No tenderness or signs of injury.     Cervical back: Neck supple.     Right lower leg: No edema.     Left lower leg: No edema.     Comments: Patient has good distal strength without clonus.  Skin:    Findings: No erythema or rash.  Neurological:     General: No focal deficit present.     Mental Status: He is alert and oriented to person, place, and time.     Sensory: No sensory deficit.     Motor: No  weakness or abnormal muscle tone.     Coordination: Coordination normal.  Psychiatric:        Mood and Affect: Mood normal.        Behavior: Behavior normal.      Imaging: No results found.

## 2021-03-24 NOTE — Procedures (Signed)
Lumbosacral Transforaminal Epidural Steroid Injection - Sub-Pedicular Approach with Fluoroscopic Guidance  Patient: Christian [name]      Date of Birth: 1946-04-28 MRN: [id number] PCP: Alroy Dust, L.Marlou Sa, MD      Visit Date: 03/24/2021   Universal Protocol:    Date/Time: 03/24/2021  Consent Given By: the patient  Position: PRONE  Additional Comments: Vital signs were monitored before and after the procedure. Patient was prepped and draped in the usual sterile fashion. The correct patient, procedure, and site was verified.   Injection Procedure Details:   Procedure diagnoses: Lumbar radiculopathy [M54.16]    Meds Administered:  Meds ordered this encounter  Medications  . methylPREDNISolone acetate (DEPO-MEDROL) injection 80 mg    Laterality: Left  Location/Site:  L3-L4  Needle:5.0 in., 22 ga.  Short bevel or Quincke spinal needle  Needle Placement: Transforaminal  Findings:    -Comments: Excellent flow of contrast along the nerve, nerve root and into the epidural space.  Procedure Details: After squaring off the end-plates to get a true AP view, the C-arm was positioned so that an oblique view of the foramen as noted above was visualized. The target area is just inferior to the "nose of the scotty dog" or sub pedicular. The soft tissues overlying this structure were infiltrated with 2-3 ml. of 1% Lidocaine without Epinephrine.  The spinal needle was inserted toward the target using a "trajectory" view along the fluoroscope beam.  Under AP and lateral visualization, the needle was advanced so it did not puncture dura and was located close the 6 O'Clock position of the pedical in AP tracterory. Biplanar projections were used to confirm position. Aspiration was confirmed to be negative for CSF and/or blood. A 1-2 ml. volume of Isovue-250 was injected and flow of contrast was noted at each level. Radiographs were obtained for documentation purposes.   After attaining the  desired flow of contrast documented above, a 0.5 to 1.0 ml test dose of 0.25% Marcaine was injected into each respective transforaminal space.  The patient was observed for 90 seconds post injection.  After no sensory deficits were reported, and normal lower extremity motor function was noted,   the above injectate was administered so that equal amounts of the injectate were placed at each foramen (level) into the transforaminal epidural space.   Additional Comments:  The patient tolerated the procedure well Dressing: 2 x 2 sterile gauze and Band-Aid    Post-procedure details: Patient was observed during the procedure. Post-procedure instructions were reviewed.  Patient left the clinic in stable condition.

## 2021-03-24 NOTE — Progress Notes (Signed)
Office Visit Note   Patient: Christian [name]           Date of Birth: 08/26/1946           MRN: [id number] Visit Date: 03/24/2021              Requested by: Garald Balding, [address] [place],  [place] [address] PCP: Alroy Dust, L.Marlou Sa, MD   Assessment & Plan: Visit Diagnoses:  1. Chronic left-sided low back pain with left-sided sciatica   2. Herniation of lumbar intervertebral disc with radiculopathy     Plan: Avoid bending, stooping and avoid lifting weights greater than 10 lbs. Avoid prolong standing and walking. Avoid frequent bending and stooping  No lifting greater than 10 lbs. May use ice or moist heat for pain. Weight loss is of benefit. Handicap license is approved. Dr. Romona Curls secretary/Assistant will call to arrange for epidural steroid injection  Have a transforaminal ESI today if no relief then see me on Monday, if relief then may reschedule for one to two weeks for follow up. f Follow-Up Instructions: Return in about 5 days (around 03/29/2021) for over book appointment for Monday 4/4.   Orders:  Orders Placed This Encounter  Procedures  . XR Lumb Spine Flex&Ext Only  . Ambulatory referral to Physical Medicine Rehab   No orders of the defined types were placed in this encounter.     Procedures: No procedures performed   Clinical Data: Findings:  MR Lumbar Spine w/o contrast (Accession [id number]) (Order [id number]) Imaging Date: 01/25/2021 Department: [facility] Released By: [place] Authorizing: Garald Balding, MD   Exam Status  Status Final (99)  PACS Intelerad Image Link  Show images for MR Lumbar Spine w/o contrast  Study Result  Narrative & Impression CLINICAL DATA:  Lumbar radiculopathy. Low back pain radiating to the left hip and leg for 2 months.  EXAM: MRI LUMBAR SPINE WITHOUT CONTRAST  TECHNIQUE: Multiplanar, multisequence MR imaging of the lumbar spine was performed. No  intravenous contrast was administered.  COMPARISON:  Lumbar spine radiographs 12/16/2020  FINDINGS: Segmentation:  Standard.  Alignment:  Normal.  Vertebrae: Mild edema in the region of the posterior pedicle/pars interarticularis on the right at L5, potentially reactive as a discrete fracture is not identified. Preserved vertebral body heights. No suspicious marrow lesion.  Conus medullaris and cauda equina: Conus extends to the T12-L1 level. Conus and cauda equina appear normal.  Paraspinal and other soft tissues: Partially visualized renal cysts including a 5.5 cm cyst on the right and numerous smaller cysts bilaterally.  Disc levels:  Disc desiccation throughout the lumbar spine. Moderate disc space narrowing at L5-S1.  T12-L1: Mild facet and ligamentum flavum hypertrophy without disc herniation or stenosis.  L1-2: Minimal disc bulging and mild facet and ligamentum flavum hypertrophy without stenosis.  L2-3: Circumferential disc bulging, a small left paracentral and subarticular disc extrusion with migration to the inferior L3 vertebral body level, and moderate facet and ligamentum flavum hypertrophy result in mild spinal stenosis and mild right and moderate left lateral recess stenosis with the disc extrusion likely resulting in left L3 nerve root impingement. Patent neural foramina.  L3-4: Mild disc bulging, small right central disc protrusion, and moderate facet and ligamentum flavum hypertrophy without significant stenosis.  L4-5: Minimal disc bulging, a small left foraminal disc protrusion, and mild-to-moderate facet and ligamentum flavum hypertrophy result in mild left neural foraminal stenosis without spinal stenosis.  L5-S1: Mild disc bulging and  a shallow central disc protrusion with annular fissure without stenosis.  IMPRESSION: 1. Disc extrusion at L2-3 resulting in left lateral recess stenosis and left L3 nerve root impingement. 2.  Mild left neural foraminal stenosis at L4-5.   Electronically Signed   By: Logan Bores M.D.   On: 01/25/2021 15:00        Subjective: Chief Complaint  Patient presents with  . Lower Back - Pain    75 year old male with 3 month history of left thigh pain with left leg giving away. Saw Dr. Durward Fortes, and found to have left knee weakness and sciatica. MRI was done showing a left L2-3  Disc extrusion that is mild with Left L3 nerve compression due to extruded fragment exiting the left L3 neuroforamen and causing nerve compression.    Review of Systems  Constitutional: Negative.   HENT: Negative.   Eyes: Negative.   Respiratory: Negative.   Cardiovascular: Negative.   Gastrointestinal: Negative.   Endocrine: Negative.   Genitourinary: Negative.   Musculoskeletal: Negative.   Skin: Negative.   Allergic/Immunologic: Negative.   Neurological: Negative.   Hematological: Negative.   Psychiatric/Behavioral: Negative.      Objective: Vital Signs: BP 116/73 (BP Location: Left Arm, Patient Position: Sitting)   Pulse 67   Ht 5\' 10"  (1.778 m)   Wt 199 lb (90.3 kg)   BMI 28.55 kg/m   Physical Exam  Back Exam   Tenderness  The patient is experiencing tenderness in the lumbar.  Muscle Strength  Right Quadriceps:  5/5  Left Quadriceps:  5/5  Right Hamstrings:  5/5  Left Hamstrings:  5/5       Specialty Comments:  No specialty comments available.  Imaging: No results found.   PMFS History: Patient Active Problem List   Diagnosis Date Noted  . Restless legs 02/15/2021  . Hypertensive retinopathy 02/15/2021  . Constipation 02/15/2021  . Chronic diastolic heart failure ([place]) 01/26/2021  . Other forms of angina pectoris ([place]) 01/26/2021  . Low back pain 12/16/2020  . Primary osteoarthritis of left knee 02/05/2019  . History of left knee replacement 02/05/2019  . PVC's (premature ventricular contractions)   . Dyspnea 12/06/2016  . Osteoarthritis of  right hip 09/22/2015  . S/P total hip arthroplasty 09/22/2015  . Obesity 05/08/2014  . Osteoarthritis of knee 05/08/2014  . S/P total knee replacement using cement 05/06/2014  . Knee pain 04/08/2014  . Preoperative clearance 04/08/2014  . Coronary artery disease   . Diastolic dysfunction   . Hypertension   . Orthostatic hypotension   . Hypercholesterolemia    Past Medical History:  Diagnosis Date  . Arthritis    "joints; shoulders; left elbow; right thumb" (05/06/2014)  . Atrial tachycardia ([place])   . Breathing problem   . Cataracts, bilateral    immature  . Chest pain   . Constipation    takes Colace and Metamucil daily  . Coronary artery disease 02/2011   cath 11/2016 showing 20% RCA, 50% OM, 35% mid LCx, 70% D1, 65% mid LAD, 70% distal LAD 80% on medical management  . Diastolic dysfunction   . GERD (gastroesophageal reflux disease)   . Heart murmur   . Hemorrhoids   . History of bronchitis   . Hypercholesterolemia    takes Crestor daily  . Hypertension   . Joint pain   . Joint pain   . Nocturia   . Orthostatic hypotension   . Palpitations   . Premature atrial contractions   .  PVC's (premature ventricular contractions)   . Restless leg syndrome   . Rheumatoid arthritis ([place])   . Sciatica   . Sinus bradycardia     Family History  Problem Relation Age of Onset  . CVA Mother   . Stroke Mother   . CVA Father   . Heart disease Father   . High blood pressure Father   . High Cholesterol Father   . Stroke Father     Past Surgical History:  Procedure Laterality Date  . ACHILLES TENDON REPAIR Left    "& fixed a spur"  . CARDIAC CATHETERIZATION  2012  . CARDIAC CATHETERIZATION N/A 12/06/2016   Procedure: Left Heart Cath and Coronary Angiography;  Surgeon: Belva Crome, MD;  Location: [facility];  Service: Cardiovascular;  Laterality: N/A;  . COLONOSCOPY    . FLEXIBLE SIGMOIDOSCOPY  X 2  . HAND SURGERY Left    "thumb; cleaned out arthritis"  . HEEL SPUR  EXCISION Left   . KNEE ARTHROSCOPY Right   . SHOULDER SURGERY Right X 2   "cleaned out spurs"  . TOE FUSION Right    great toe; plates and screws  . TONSILLECTOMY  ~ 1950  . TOTAL HIP ARTHROPLASTY Right 09/22/2015   Procedure: TOTAL HIP ARTHROPLASTY;  Surgeon: Garald Balding, MD;  Location: [facility];  Service: Orthopedics;  Laterality: Right;  . TOTAL KNEE ARTHROPLASTY Right 05/06/2014  . TOTAL KNEE ARTHROPLASTY Right 05/06/2014   Procedure: RIGHT TOTAL KNEE ARTHROPLASTY;  Surgeon: Garald Balding, MD;  Location: [facility];  Service: Orthopedics;  Laterality: Right;  . TOTAL KNEE ARTHROPLASTY Left 02/05/2019   Procedure: LEFT TOTAL KNEE ARTHROPLASTY;  Surgeon: Garald Balding, MD;  Location: [facility];  Service: Orthopedics;  Laterality: Left;   Social History   Occupational History  . Occupation: Retired  Tobacco Use  . Smoking status: Former Smoker    Packs/day: 2.00    Years: 10.00    Pack years: 20.00    Types: Cigarettes    Quit date: 1981    Years since quitting: 41.2  . Smokeless tobacco: Never Used  . Tobacco comment: quit smoking in 1981  Vaping Use  . Vaping Use: Never used  Substance and Sexual Activity  . Alcohol use: Yes    Comment: "drink a beer sometimes once/month"  . Drug use: No  . Sexual activity: Never

## 2021-03-24 NOTE — Progress Notes (Signed)
Pt state lower back pain that travels down his left leg and both side of his calf. Pt state he take over the counter pain meds to help ease the pain. Pt has hx of inj on 02/15/21 pt state he doesn't know if it help , if so just a little.  Numeric Pain Rating Scale and Functional Assessment Average Pain 7   In the last MONTH (on 0-10 scale) has pain interfered with the following?  1. General activity like being  able to carry out your everyday physical activities such as walking, climbing stairs, carrying groceries, or moving a chair?  Rating(6)   +Driver, -BT, -Dye Allergies.

## 2021-03-24 NOTE — Patient Instructions (Signed)
Avoid bending, stooping and avoid lifting weights greater than 10 lbs. Avoid prolong standing and walking. Avoid frequent bending and stooping  No lifting greater than 10 lbs. May use ice or moist heat for pain. Weight loss is of benefit. Handicap license is approved. Dr. Newton's secretary/Assistant will call to arrange for epidural steroid injection  

## 2021-03-24 NOTE — Patient Instructions (Signed)

## 2021-03-25 DIAGNOSIS — C44529 Squamous cell carcinoma of skin of other part of trunk: Secondary | ICD-10-CM | POA: Diagnosis not present

## 2021-03-29 ENCOUNTER — Telehealth: Payer: Self-pay | Admitting: *Deleted

## 2021-03-29 ENCOUNTER — Other Ambulatory Visit: Payer: Self-pay

## 2021-03-29 ENCOUNTER — Encounter: Payer: Self-pay | Admitting: Specialist

## 2021-03-29 ENCOUNTER — Ambulatory Visit (INDEPENDENT_AMBULATORY_CARE_PROVIDER_SITE_OTHER): Payer: Medicare Other | Admitting: Specialist

## 2021-03-29 VITALS — BP 98/67 | HR 54 | Ht 70.0 in | Wt 199.0 lb

## 2021-03-29 DIAGNOSIS — M5116 Intervertebral disc disorders with radiculopathy, lumbar region: Secondary | ICD-10-CM

## 2021-03-29 DIAGNOSIS — M79605 Pain in left leg: Secondary | ICD-10-CM

## 2021-03-29 NOTE — Progress Notes (Addendum)
Office Visit Note   Patient: Christian [name]           Date of Birth: 1946-09-30           MRN: [id number] Visit Date: 03/29/2021              Requested by: Alroy Dust, L.Marlou Sa, [address] [place],  [place] [address] PCP: Alroy Dust, L.Marlou Sa, MD   Assessment & Plan: Visit Diagnoses:  1. Herniation of lumbar intervertebral disc with radiculopathy   2. Pain in left leg     Plan: Avoid bending, stooping and avoid lifting weights greater than 10 lbs. Avoid prolong standing and walking. Order for a new walker with wheels. Surgery scheduling secretary Kandice Hams, will call you in the next week to schedule for surgery.  Surgery recommended is a one level lumbar microdiscectomy left L2-3 this would be done with the OR microscope. Take hydrocodone for for pain. Risk of surgery includes risk of infection 1 in 300 patients, bleeding 1/2% chance you would need a transfusion.   Risk to the nerves is one in 10,000.  Expect improved walking and standing tolerance. Expect relief of leg pain but numbness may persist depending on the length and degree of pressure that has been present.    Follow-Up Instructions: Return in about 4 weeks (around 04/26/2021).   Orders:  No orders of the defined types were placed in this encounter.  No orders of the defined types were placed in this encounter.     Procedures: No procedures performed   Clinical Data: Findings:  CLINICAL DATA:  Lumbar radiculopathy. Low back pain radiating to the left hip and leg for 2 months.  EXAM: MRI LUMBAR SPINE WITHOUT CONTRAST  TECHNIQUE: Multiplanar, multisequence MR imaging of the lumbar spine was performed. No intravenous contrast was administered.  COMPARISON:  Lumbar spine radiographs 12/16/2020  FINDINGS: Segmentation:  Standard.  Alignment:  Normal.  Vertebrae: Mild edema in the region of the posterior pedicle/pars interarticularis on the right at L5, potentially reactive  as a discrete fracture is not identified. Preserved vertebral body heights. No suspicious marrow lesion.  Conus medullaris and cauda equina: Conus extends to the T12-L1 level. Conus and cauda equina appear normal.  Paraspinal and other soft tissues: Partially visualized renal cysts including a 5.5 cm cyst on the right and numerous smaller cysts bilaterally.  Disc levels:  Disc desiccation throughout the lumbar spine. Moderate disc space narrowing at L5-S1.  T12-L1: Mild facet and ligamentum flavum hypertrophy without disc herniation or stenosis.  L1-2: Minimal disc bulging and mild facet and ligamentum flavum hypertrophy without stenosis.  L2-3: Circumferential disc bulging, a small left paracentral and subarticular disc extrusion with migration to the inferior L3 vertebral body level, and moderate facet and ligamentum flavum hypertrophy result in mild spinal stenosis and mild right and moderate left lateral recess stenosis with the disc extrusion likely resulting in left L3 nerve root impingement. Patent neural foramina.  L3-4: Mild disc bulging, small right central disc protrusion, and moderate facet and ligamentum flavum hypertrophy without significant stenosis.  L4-5: Minimal disc bulging, a small left foraminal disc protrusion, and mild-to-moderate facet and ligamentum flavum hypertrophy result in mild left neural foraminal stenosis without spinal stenosis.  L5-S1: Mild disc bulging and a shallow central disc protrusion with annular fissure without stenosis.  IMPRESSION: 1. Disc extrusion at L2-3 resulting in left lateral recess stenosis and left L3 nerve root impingement. 2. Mild left neural foraminal stenosis at L4-5.  Electronically Signed   By: Logan Bores M.D.   On: 01/25/2021 15:00    Subjective: Chief Complaint  Patient presents with  . Lower Back - Follow-up    5 days post Lt L4-5 TF injection with Dr. Ernestina Patches, states that he not been  able to notice any difference in his pain.    75 year old male with a 4 month history of back pain left lower lumbar with radiation into the left anterior thigh to the left knee. He is experiencing worsening pain with standing and walking. No bowel or bladder difficulty. Seen last week and evaluated, recommended Attempted ESI via Transforaminal approach but this does not seem to be helping.  Pain improves with sitting and stooping. Underwent ESI s last one one week ago Thursday 3/31 with no relief. The MRI of the lumbar spine demonstrates a left L2-3 HNP with extrusion of disc over the left posterior L3 vertebral body casing left L3 nerve compression with extension of the disc herniation into the left L3 neuroforamen. He has a history of CAD, sees [name] who has been mainly concerned about his HTN and obesity. He has since lost 80 lbs and no longer requires antihypertensive agents. He has CAD that is not reconstructable disease. His EF was 60-65 percent on stress testing.    Review of Systems  Constitutional: Negative.   HENT: Negative.   Eyes: Negative.   Respiratory: Negative.  Negative for apnea, cough, chest tightness, shortness of breath and wheezing.   Cardiovascular: Negative.  Negative for chest pain, palpitations and leg swelling.  Gastrointestinal: Negative.   Endocrine: Negative.   Genitourinary: Negative.   Musculoskeletal: Negative.   Skin: Negative.   Allergic/Immunologic: Negative.   Neurological: Negative.   Hematological: Negative.   Psychiatric/Behavioral: Negative.      Objective: Vital Signs: BP 98/67 (BP Location: Left Arm, Patient Position: Sitting)   Pulse (!) 54   Ht 5\' 10"  (1.778 m)   Wt 199 lb (90.3 kg)   BMI 28.55 kg/m   Physical Exam Musculoskeletal:     Lumbar back: Negative right straight leg raise test and negative left straight leg raise test.     Back Exam   Tenderness  The patient is experiencing tenderness in the lumbar.  Range of  Motion  Extension: abnormal  Flexion: 70  Lateral bend right: abnormal  Lateral bend left: abnormal  Rotation right: abnormal  Rotation left: abnormal   Muscle Strength  Left Quadriceps:  4/5  Right Hamstrings:  5/5  Left Hamstrings:  5/5   Tests  Straight leg raise right: negative Straight leg raise left: negative  Reflexes  Patellar: 0/4 Achilles: 2/4 Biceps: 2/4 Babinski's sign: normal   Other  Toe walk: normal Heel walk: normal Sensation: decreased  Comments:  Decreased sensation left anterior thigh and knee. Positive reverse SLR left with severe replication of left anterior knee and thigh pain.       Specialty Comments:  No specialty comments available.  Imaging: No results found.   PMFS History: Patient Active Problem List   Diagnosis Date Noted  . Restless legs 02/15/2021  . Hypertensive retinopathy 02/15/2021  . Constipation 02/15/2021  . Chronic diastolic heart failure ([place]) 01/26/2021  . Other forms of angina pectoris ([place]) 01/26/2021  . Low back pain 12/16/2020  . Primary osteoarthritis of left knee 02/05/2019  . History of left knee replacement 02/05/2019  . PVC's (premature ventricular contractions)   . Dyspnea 12/06/2016  . Osteoarthritis of right hip 09/22/2015  .  S/P total hip arthroplasty 09/22/2015  . Obesity 05/08/2014  . Osteoarthritis of knee 05/08/2014  . S/P total knee replacement using cement 05/06/2014  . Knee pain 04/08/2014  . Preoperative clearance 04/08/2014  . Coronary artery disease   . Diastolic dysfunction   . Hypertension   . Orthostatic hypotension   . Hypercholesterolemia    Past Medical History:  Diagnosis Date  . Arthritis    "joints; shoulders; left elbow; right thumb" (05/06/2014)  . Atrial tachycardia ([facility])   . Breathing problem   . Cataracts, bilateral    immature  . Chest pain   . Constipation    takes Colace and Metamucil daily  . Coronary artery disease 02/2011   cath 11/2016 showing 20% RCA,  50% OM, 35% mid LCx, 70% D1, 65% mid LAD, 70% distal LAD 80% on medical management  . Diastolic dysfunction   . GERD (gastroesophageal reflux disease)   . Heart murmur   . Hemorrhoids   . History of bronchitis   . Hypercholesterolemia    takes Crestor daily  . Hypertension   . Joint pain   . Joint pain   . Nocturia   . Orthostatic hypotension   . Palpitations   . Premature atrial contractions   . PVC's (premature ventricular contractions)   . Restless leg syndrome   . Rheumatoid arthritis ([facility])   . Sciatica   . Sinus bradycardia     Family History  Problem Relation Age of Onset  . CVA Mother   . Stroke Mother   . CVA Father   . Heart disease Father   . High blood pressure Father   . High Cholesterol Father   . Stroke Father     Past Surgical History:  Procedure Laterality Date  . ACHILLES TENDON REPAIR Left    "& fixed a spur"  . CARDIAC CATHETERIZATION  2012  . CARDIAC CATHETERIZATION N/A 12/06/2016   Procedure: Left Heart Cath and Coronary Angiography;  Surgeon: Belva Crome, MD;  Location: [facility];  Service: Cardiovascular;  Laterality: N/A;  . COLONOSCOPY    . FLEXIBLE SIGMOIDOSCOPY  X 2  . HAND SURGERY Left    "thumb; cleaned out arthritis"  . HEEL SPUR EXCISION Left   . KNEE ARTHROSCOPY Right   . SHOULDER SURGERY Right X 2   "cleaned out spurs"  . TOE FUSION Right    great toe; plates and screws  . TONSILLECTOMY  ~ 1950  . TOTAL HIP ARTHROPLASTY Right 09/22/2015   Procedure: TOTAL HIP ARTHROPLASTY;  Surgeon: Garald Balding, MD;  Location: [facility];  Service: Orthopedics;  Laterality: Right;  . TOTAL KNEE ARTHROPLASTY Right 05/06/2014  . TOTAL KNEE ARTHROPLASTY Right 05/06/2014   Procedure: RIGHT TOTAL KNEE ARTHROPLASTY;  Surgeon: Garald Balding, MD;  Location: [facility];  Service: Orthopedics;  Laterality: Right;  . TOTAL KNEE ARTHROPLASTY Left 02/05/2019   Procedure: LEFT TOTAL KNEE ARTHROPLASTY;  Surgeon: Garald Balding, MD;  Location: [facility];   Service: Orthopedics;  Laterality: Left;   Social History   Occupational History  . Occupation: Retired  Tobacco Use  . Smoking status: Former Smoker    Packs/day: 2.00    Years: 10.00    Pack years: 20.00    Types: Cigarettes    Quit date: 1981    Years since quitting: 41.2  . Smokeless tobacco: Never Used  . Tobacco comment: quit smoking in 1981  Vaping Use  . Vaping Use: Never used  Substance and  Sexual Activity  . Alcohol use: Yes    Comment: "drink a beer sometimes once/month"  . Drug use: No  . Sexual activity: Never

## 2021-03-29 NOTE — Patient Instructions (Signed)
  Plan: Avoid bending, stooping and avoid lifting weights greater than 10 lbs. Avoid prolong standing and walking. Order for a new walker with wheels. Surgery scheduling secretary Kandice Hams, will call you in the next week to schedule for surgery.  Surgery recommended is a one level lumbar microdiscectomy left L2-3 this would be done with the OR microscope. Take hydrocodone for for pain. Risk of surgery includes risk of infection 1 in 300 patients, bleeding 1/2% chance you would need a transfusion.   Risk to the nerves is one in 10,000.  Expect improved walking and standing tolerance. Expect relief of leg pain but numbness may persist depending on the length and degree of pressure that has been present.

## 2021-03-29 NOTE — Progress Notes (Signed)
Mr. Arzuaga denies chest pain or shortness of breath. patien was given cardiac clearance today.

## 2021-03-29 NOTE — Telephone Encounter (Signed)
   [facility] Pre-operative Risk Assessment    Patient Name: Christian [name]  DOB: 1946-04-13  MRN: [id number]   [facility] STAFF: - Please ensure there is not already an duplicate clearance open for this procedure. - Under Visit Info/Reason for Call, type in Other and utilize the format Clearance MM/DD/YY or Clearance TBD. Do not use dashes or single digits. - If request is for dental extraction, please clarify the # of teeth to be extracted.  Request for surgical clearance:  1. What type of surgery is being performed? L3 HEMILAMINECTOMY   2. When is this surgery scheduled? 03/30/21   3. What type of clearance is required (medical clearance vs. Pharmacy clearance to hold med vs. Both)? MEDICAL  4. Are there any medications that need to be held prior to surgery and how long? ASA  5. Practice name and name of physician performing surgery? [facility]; DR. Jeneen Rinks [facility]  6. What is the office phone number? [phone]   7.   What is the office fax number? [place].   Anesthesia type (None, local, MAC, general) ? GENERAL   Julaine Hua 03/29/2021, 4:21 PM  _________________________________________________________________   (provider comments below)

## 2021-03-29 NOTE — Telephone Encounter (Signed)
   Patient Name: Christian [name]  DOB: [birth year]  MRN: [id number]   Primary Cardiologist: Fransico Him, MD  Chart reviewed as part of pre-operative protocol coverage. Patient was last seen by [name] in 05/2020. Myoview in 07/2020 was low risk with no evidence of ischemia. Patient was contacted today for further pre-op evaluation at which time he reported doing well since last visit. He has lost 80lbs since last visit through diet and exercise. He denies any chest pain, shortness of breath, orthopnea, PND, palpitations, near syncope/syncope. He is able to complete >4 METS without any anginal symptoms. Given past medical history and time since last visit, based on ACC/AHA guidelines, ALDEAN PIPE would be at acceptable risk for the planned procedure without further cardiovascular testing.   Patient has nonobstructive CAD and has no history of PCI. Therefore, OK to hold Aspirin if needed prior to procedure. Recommend restarting this as soon as able postoperatively.   I will route this recommendation to the requesting party via Epic fax function and remove from pre-op pool.  Please call with questions.  Darreld Mclean, PA-C 03/29/2021, 4:38 PM

## 2021-03-30 ENCOUNTER — Other Ambulatory Visit: Payer: Self-pay

## 2021-03-30 ENCOUNTER — Encounter (HOSPITAL_COMMUNITY)
Admission: RE | Disposition: A | Discharge status: Home / Self Care | Payer: Self-pay | Source: Home / Self Care | Attending: Specialist

## 2021-03-30 ENCOUNTER — Ambulatory Visit (HOSPITAL_COMMUNITY): Payer: Medicare Other | Admitting: Registered Nurse

## 2021-03-30 ENCOUNTER — Encounter (HOSPITAL_COMMUNITY): Payer: Self-pay | Admitting: Specialist

## 2021-03-30 ENCOUNTER — Observation Stay (HOSPITAL_COMMUNITY)
Admission: RE | Admit: 2021-03-30 | Discharge: 2021-03-31 | Disposition: A | Discharge status: Home / Self Care | Payer: Medicare Other | Attending: Specialist | Admitting: Specialist

## 2021-03-30 ENCOUNTER — Ambulatory Visit (HOSPITAL_COMMUNITY): Payer: Medicare Other

## 2021-03-30 DIAGNOSIS — M5126 Other intervertebral disc displacement, lumbar region: Secondary | ICD-10-CM | POA: Diagnosis not present

## 2021-03-30 DIAGNOSIS — I10 Essential (primary) hypertension: Secondary | ICD-10-CM | POA: Diagnosis not present

## 2021-03-30 DIAGNOSIS — I110 Hypertensive heart disease with heart failure: Secondary | ICD-10-CM | POA: Diagnosis not present

## 2021-03-30 DIAGNOSIS — I5032 Chronic diastolic (congestive) heart failure: Secondary | ICD-10-CM | POA: Diagnosis not present

## 2021-03-30 DIAGNOSIS — I2510 Atherosclerotic heart disease of native coronary artery without angina pectoris: Secondary | ICD-10-CM | POA: Insufficient documentation

## 2021-03-30 DIAGNOSIS — Z96641 Presence of right artificial hip joint: Secondary | ICD-10-CM | POA: Insufficient documentation

## 2021-03-30 DIAGNOSIS — Z981 Arthrodesis status: Secondary | ICD-10-CM | POA: Diagnosis not present

## 2021-03-30 DIAGNOSIS — Z7982 Long term (current) use of aspirin: Secondary | ICD-10-CM | POA: Diagnosis not present

## 2021-03-30 DIAGNOSIS — Z79899 Other long term (current) drug therapy: Secondary | ICD-10-CM | POA: Diagnosis not present

## 2021-03-30 DIAGNOSIS — Z87891 Personal history of nicotine dependence: Secondary | ICD-10-CM | POA: Diagnosis not present

## 2021-03-30 DIAGNOSIS — E7800 Pure hypercholesterolemia, unspecified: Secondary | ICD-10-CM | POA: Diagnosis not present

## 2021-03-30 DIAGNOSIS — M5116 Intervertebral disc disorders with radiculopathy, lumbar region: Secondary | ICD-10-CM | POA: Diagnosis not present

## 2021-03-30 DIAGNOSIS — Z20822 Contact with and (suspected) exposure to covid-19: Secondary | ICD-10-CM | POA: Insufficient documentation

## 2021-03-30 DIAGNOSIS — Z419 Encounter for procedure for purposes other than remedying health state, unspecified: Secondary | ICD-10-CM

## 2021-03-30 DIAGNOSIS — Z96653 Presence of artificial knee joint, bilateral: Secondary | ICD-10-CM | POA: Diagnosis not present

## 2021-03-30 DIAGNOSIS — Z9889 Other specified postprocedural states: Secondary | ICD-10-CM

## 2021-03-30 HISTORY — PX: LUMBAR LAMINECTOMY/DECOMPRESSION MICRODISCECTOMY: SHX5026

## 2021-03-30 LAB — COMPREHENSIVE METABOLIC PANEL
ALT: 19 U/L (ref 0–44)
AST: 15 U/L (ref 15–41)
Albumin: 3.3 g/dL — ABNORMAL LOW (ref 3.5–5.0)
Alkaline Phosphatase: 42 U/L (ref 38–126)
Anion gap: 3 — ABNORMAL LOW (ref 5–15)
BUN: 18 mg/dL (ref 8–23)
CO2: 25 mmol/L (ref 22–32)
Calcium: 8.4 mg/dL — ABNORMAL LOW (ref 8.9–10.3)
Chloride: 109 mmol/L (ref 98–111)
Creatinine, Ser: 0.89 mg/dL (ref 0.61–1.24)
GFR, Estimated: >60 mL/min (ref >60)
Glucose, Bld: 94 mg/dL (ref 70–99)
Potassium: 3.8 mmol/L (ref 3.5–5.1)
Sodium: 137 mmol/L (ref 135–145)
Total Bilirubin: 0.5 mg/dL (ref 0.3–1.2)
Total Protein: 5.4 g/dL — ABNORMAL LOW (ref 6.5–8.1)

## 2021-03-30 LAB — CBC
HCT: 41.3 % (ref 39.0–52.0)
Hemoglobin: 14.1 g/dL (ref 13.0–17.0)
MCH: 34 pg (ref 26.0–34.0)
MCHC: 34.1 g/dL (ref 30.0–36.0)
MCV: 99.5 fL (ref 80.0–100.0)
Platelets: 140 10*3/uL — ABNORMAL LOW (ref 150–400)
RBC: 4.15 MIL/uL — ABNORMAL LOW (ref 4.22–5.81)
RDW: 12.8 % (ref 11.5–15.5)
WBC: 6 10*3/uL (ref 4.0–10.5)
nRBC: 0 % (ref 0.0–0.2)

## 2021-03-30 LAB — SURGICAL PCR SCREEN
MRSA, PCR: NEGATIVE
Staphylococcus aureus: NEGATIVE

## 2021-03-30 LAB — SARS CORONAVIRUS 2 BY RT PCR (HOSPITAL ORDER, PERFORMED IN ~~LOC~~ HOSPITAL LAB): SARS Coronavirus 2: NEGATIVE

## 2021-03-30 SURGERY — LUMBAR LAMINECTOMY/DECOMPRESSION MICRODISCECTOMY
Anesthesia: General

## 2021-03-30 MED ORDER — SODIUM CHLORIDE 0.9 % IV SOLN: INTRAVENOUS | Status: DC

## 2021-03-30 MED ORDER — DOCUSATE SODIUM 100 MG PO CAPS
100.0000 mg | ORAL_CAPSULE | Freq: Two times a day (BID) | ORAL | Status: DC
  Administered 2021-03-30 – 2021-03-31 (×2): 100 mg via ORAL
  Filled 2021-03-30 (×2): qty 1

## 2021-03-30 MED ORDER — ONDANSETRON HCL 4 MG/2ML IJ SOLN
INTRAMUSCULAR | Status: DC | PRN
  Administered 2021-03-30: 4 mg via INTRAVENOUS

## 2021-03-30 MED ORDER — ACETAMINOPHEN 500 MG PO TABS
ORAL_TABLET | ORAL | Status: AC
  Administered 2021-03-30: 1000 mg via ORAL
  Filled 2021-03-30: qty 2

## 2021-03-30 MED ORDER — POLYETHYLENE GLYCOL 3350 17 G PO PACK: 17.0000 g | PACK | Freq: Every day | ORAL | Status: DC | PRN

## 2021-03-30 MED ORDER — CEFAZOLIN SODIUM-DEXTROSE 2-4 GM/100ML-% IV SOLN
2.0000 g | Freq: Three times a day (TID) | INTRAVENOUS | Status: AC
  Administered 2021-03-30 – 2021-03-31 (×2): 2 g via INTRAVENOUS
  Filled 2021-03-30 (×2): qty 100

## 2021-03-30 MED ORDER — SUGAMMADEX SODIUM 200 MG/2ML IV SOLN
INTRAVENOUS | Status: DC | PRN
  Administered 2021-03-30: 200 mg via INTRAVENOUS

## 2021-03-30 MED ORDER — LACTATED RINGERS IV SOLN: INTRAVENOUS | Status: DC

## 2021-03-30 MED ORDER — FENTANYL CITRATE (PF) 250 MCG/5ML IJ SOLN
INTRAMUSCULAR | Status: DC | PRN
  Administered 2021-03-30: 100 ug via INTRAVENOUS
  Administered 2021-03-30: 150 ug via INTRAVENOUS

## 2021-03-30 MED ORDER — FUROSEMIDE 20 MG PO TABS
20.0000 mg | ORAL_TABLET | ORAL | Status: DC
  Administered 2021-03-31: 20 mg via ORAL
  Filled 2021-03-30: qty 1

## 2021-03-30 MED ORDER — POVIDONE-IODINE 10 % EX SWAB: 2.0000 "application " | Freq: Once | CUTANEOUS | Status: DC

## 2021-03-30 MED ORDER — CHLORHEXIDINE GLUCONATE 0.12 % MT SOLN
OROMUCOSAL | Status: AC
  Administered 2021-03-30: 15 mL via OROMUCOSAL
  Filled 2021-03-30: qty 15

## 2021-03-30 MED ORDER — BUPIVACAINE LIPOSOME 1.3 % IJ SUSP
INTRAMUSCULAR | Status: DC | PRN
  Administered 2021-03-30: 20 mL

## 2021-03-30 MED ORDER — ACETAMINOPHEN 500 MG PO TABS: 1000.0000 mg | ORAL_TABLET | Freq: Once | ORAL | Status: AC

## 2021-03-30 MED ORDER — ONDANSETRON HCL 4 MG/2ML IJ SOLN
INTRAMUSCULAR | Status: AC
  Filled 2021-03-30: qty 2

## 2021-03-30 MED ORDER — OXYCODONE HCL 5 MG/5ML PO SOLN: 5.0000 mg | Freq: Once | ORAL | Status: DC | PRN

## 2021-03-30 MED ORDER — ROCURONIUM BROMIDE 10 MG/ML (PF) SYRINGE
PREFILLED_SYRINGE | INTRAVENOUS | Status: DC | PRN
  Administered 2021-03-30: 20 mg via INTRAVENOUS
  Administered 2021-03-30: 60 mg via INTRAVENOUS

## 2021-03-30 MED ORDER — LIDOCAINE 2% (20 MG/ML) 5 ML SYRINGE
INTRAMUSCULAR | Status: DC | PRN
  Administered 2021-03-30: 40 mg via INTRAVENOUS

## 2021-03-30 MED ORDER — CEFAZOLIN SODIUM-DEXTROSE 2-4 GM/100ML-% IV SOLN
2.0000 g | INTRAVENOUS | Status: AC
  Administered 2021-03-30: 2 g via INTRAVENOUS
  Filled 2021-03-30: qty 100

## 2021-03-30 MED ORDER — POVIDONE-IODINE 7.5 % EX SOLN: Freq: Once | CUTANEOUS | Status: DC

## 2021-03-30 MED ORDER — PROPOFOL 10 MG/ML IV BOLUS
INTRAVENOUS | Status: DC | PRN
  Administered 2021-03-30: 150 mg via INTRAVENOUS

## 2021-03-30 MED ORDER — DEXAMETHASONE SODIUM PHOSPHATE 10 MG/ML IJ SOLN
INTRAMUSCULAR | Status: AC
  Filled 2021-03-30: qty 1

## 2021-03-30 MED ORDER — DOCUSATE SODIUM 100 MG PO CAPS: 300.0000 mg | ORAL_CAPSULE | Freq: Every day | ORAL | Status: DC

## 2021-03-30 MED ORDER — PROPOFOL 10 MG/ML IV BOLUS
INTRAVENOUS | Status: AC
  Filled 2021-03-30: qty 20

## 2021-03-30 MED ORDER — THROMBIN 20000 UNITS EX SOLR
CUTANEOUS | Status: AC
  Filled 2021-03-30: qty 20000

## 2021-03-30 MED ORDER — FLEET ENEMA 7-19 GM/118ML RE ENEM: 1.0000 | ENEMA | Freq: Once | RECTAL | Status: DC | PRN

## 2021-03-30 MED ORDER — PROMETHAZINE HCL 25 MG/ML IJ SOLN: 6.2500 mg | INTRAMUSCULAR | Status: DC | PRN

## 2021-03-30 MED ORDER — MORPHINE SULFATE (PF) 2 MG/ML IV SOLN: 1.0000 mg | INTRAVENOUS | Status: DC | PRN

## 2021-03-30 MED ORDER — ACETAMINOPHEN 325 MG PO TABS: 650.0000 mg | ORAL_TABLET | ORAL | Status: DC | PRN

## 2021-03-30 MED ORDER — HYDROCODONE-ACETAMINOPHEN 10-325 MG PO TABS
1.0000 | ORAL_TABLET | ORAL | Status: DC | PRN
Start: 2021-03-30 — End: 2021-03-31
  Administered 2021-03-30 – 2021-03-31 (×3): 1 via ORAL
  Filled 2021-03-30 (×3): qty 1

## 2021-03-30 MED ORDER — EPHEDRINE SULFATE-NACL 50-0.9 MG/10ML-% IV SOSY
PREFILLED_SYRINGE | INTRAVENOUS | Status: DC | PRN
  Administered 2021-03-30: 10 mg via INTRAVENOUS

## 2021-03-30 MED ORDER — LIDOCAINE 2% (20 MG/ML) 5 ML SYRINGE
INTRAMUSCULAR | Status: AC
  Filled 2021-03-30: qty 5

## 2021-03-30 MED ORDER — ORAL CARE MOUTH RINSE: 15.0000 mL | Freq: Once | OROMUCOSAL | Status: AC

## 2021-03-30 MED ORDER — MIDAZOLAM HCL 2 MG/2ML IJ SOLN: 0.5000 mg | Freq: Once | INTRAMUSCULAR | Status: DC | PRN

## 2021-03-30 MED ORDER — ONDANSETRON HCL 4 MG/2ML IJ SOLN: 4.0000 mg | Freq: Four times a day (QID) | INTRAMUSCULAR | Status: DC | PRN

## 2021-03-30 MED ORDER — MEPERIDINE HCL 25 MG/ML IJ SOLN: 6.2500 mg | INTRAMUSCULAR | Status: DC | PRN

## 2021-03-30 MED ORDER — BUPIVACAINE HCL 0.5 % IJ SOLN
INTRAMUSCULAR | Status: DC | PRN
  Administered 2021-03-30: 30 mL

## 2021-03-30 MED ORDER — DEXAMETHASONE SODIUM PHOSPHATE 10 MG/ML IJ SOLN
INTRAMUSCULAR | Status: DC | PRN
  Administered 2021-03-30: 5 mg via INTRAVENOUS

## 2021-03-30 MED ORDER — ACETAMINOPHEN 500 MG PO TABS: 1000.0000 mg | ORAL_TABLET | Freq: Three times a day (TID) | ORAL | Status: DC | PRN

## 2021-03-30 MED ORDER — BUPIVACAINE HCL (PF) 0.5 % IJ SOLN
INTRAMUSCULAR | Status: AC
  Filled 2021-03-30: qty 30

## 2021-03-30 MED ORDER — EPHEDRINE 5 MG/ML INJ
INTRAVENOUS | Status: AC
  Filled 2021-03-30: qty 10

## 2021-03-30 MED ORDER — HYDROMORPHONE HCL 1 MG/ML IJ SOLN: 0.2500 mg | INTRAMUSCULAR | Status: DC | PRN

## 2021-03-30 MED ORDER — MENTHOL 3 MG MT LOZG: 1.0000 | LOZENGE | OROMUCOSAL | Status: DC | PRN

## 2021-03-30 MED ORDER — ONDANSETRON HCL 4 MG PO TABS: 4.0000 mg | ORAL_TABLET | Freq: Four times a day (QID) | ORAL | Status: DC | PRN

## 2021-03-30 MED ORDER — GABAPENTIN 300 MG PO CAPS
300.0000 mg | ORAL_CAPSULE | Freq: Once | ORAL | Status: AC
  Administered 2021-03-30: 300 mg via ORAL
  Filled 2021-03-30: qty 1

## 2021-03-30 MED ORDER — ACETAMINOPHEN 650 MG RE SUPP: 650.0000 mg | RECTAL | Status: DC | PRN

## 2021-03-30 MED ORDER — ALUM & MAG HYDROXIDE-SIMETH 200-200-20 MG/5ML PO SUSP: 30.0000 mL | Freq: Four times a day (QID) | ORAL | Status: DC | PRN

## 2021-03-30 MED ORDER — GABAPENTIN 300 MG PO CAPS
300.0000 mg | ORAL_CAPSULE | Freq: Three times a day (TID) | ORAL | Status: DC
  Administered 2021-03-30 – 2021-03-31 (×3): 300 mg via ORAL
  Filled 2021-03-30 (×3): qty 1

## 2021-03-30 MED ORDER — ISOSORBIDE MONONITRATE ER 60 MG PO TB24
60.0000 mg | ORAL_TABLET | Freq: Every day | ORAL | Status: DC
  Administered 2021-03-31: 60 mg via ORAL
  Filled 2021-03-30: qty 1

## 2021-03-30 MED ORDER — PHENOL 1.4 % MT LIQD: 1.0000 | OROMUCOSAL | Status: DC | PRN

## 2021-03-30 MED ORDER — ASPIRIN EC 81 MG PO TBEC
81.0000 mg | DELAYED_RELEASE_TABLET | Freq: Every day | ORAL | Status: DC
  Administered 2021-03-31: 81 mg via ORAL
  Filled 2021-03-30: qty 1

## 2021-03-30 MED ORDER — CHLORHEXIDINE GLUCONATE 0.12 % MT SOLN: 15.0000 mL | Freq: Once | OROMUCOSAL | Status: AC

## 2021-03-30 MED ORDER — METHOCARBAMOL 500 MG PO TABS
500.0000 mg | ORAL_TABLET | Freq: Four times a day (QID) | ORAL | Status: DC | PRN
  Administered 2021-03-30 – 2021-03-31 (×2): 500 mg via ORAL
  Filled 2021-03-30 (×2): qty 1

## 2021-03-30 MED ORDER — HYDROCODONE-ACETAMINOPHEN 7.5-325 MG PO TABS: 2.0000 | ORAL_TABLET | ORAL | Status: DC | PRN

## 2021-03-30 MED ORDER — 0.9 % SODIUM CHLORIDE (POUR BTL) OPTIME
TOPICAL | Status: DC | PRN
  Administered 2021-03-30: 1000 mL

## 2021-03-30 MED ORDER — PHENYLEPHRINE HCL-NACL 10-0.9 MG/250ML-% IV SOLN
INTRAVENOUS | Status: DC | PRN
  Administered 2021-03-30: 30 ug/min via INTRAVENOUS

## 2021-03-30 MED ORDER — HYDROCODONE-ACETAMINOPHEN 7.5-325 MG PO TABS
1.0000 | ORAL_TABLET | Freq: Four times a day (QID) | ORAL | Status: DC
  Administered 2021-03-30 – 2021-03-31 (×2): 1 via ORAL
  Filled 2021-03-30 (×2): qty 1

## 2021-03-30 MED ORDER — ROCURONIUM BROMIDE 10 MG/ML (PF) SYRINGE
PREFILLED_SYRINGE | INTRAVENOUS | Status: AC
  Filled 2021-03-30: qty 10

## 2021-03-30 MED ORDER — METHOCARBAMOL 1000 MG/10ML IJ SOLN
500.0000 mg | Freq: Four times a day (QID) | INTRAVENOUS | Status: DC | PRN
  Filled 2021-03-30: qty 5

## 2021-03-30 MED ORDER — ROSUVASTATIN CALCIUM 20 MG PO TABS
20.0000 mg | ORAL_TABLET | Freq: Every day | ORAL | Status: DC
  Administered 2021-03-31: 20 mg via ORAL
  Filled 2021-03-30: qty 1

## 2021-03-30 MED ORDER — VITAMIN D 25 MCG (1000 UNIT) PO TABS
1000.0000 [IU] | ORAL_TABLET | Freq: Every day | ORAL | Status: DC
  Administered 2021-03-31: 1000 [IU] via ORAL
  Filled 2021-03-30: qty 1

## 2021-03-30 MED ORDER — SODIUM CHLORIDE 0.9% FLUSH: 3.0000 mL | Freq: Two times a day (BID) | INTRAVENOUS | Status: DC

## 2021-03-30 MED ORDER — THROMBIN 20000 UNITS EX SOLR: CUTANEOUS | Status: DC | PRN

## 2021-03-30 MED ORDER — BUPIVACAINE LIPOSOME 1.3 % IJ SUSP
INTRAMUSCULAR | Status: AC
  Filled 2021-03-30: qty 20

## 2021-03-30 MED ORDER — BISACODYL 5 MG PO TBEC: 5.0000 mg | DELAYED_RELEASE_TABLET | Freq: Every day | ORAL | Status: DC | PRN

## 2021-03-30 MED ORDER — FENTANYL CITRATE (PF) 250 MCG/5ML IJ SOLN
INTRAMUSCULAR | Status: AC
  Filled 2021-03-30: qty 5

## 2021-03-30 MED ORDER — SODIUM CHLORIDE 0.9% FLUSH: 3.0000 mL | INTRAVENOUS | Status: DC | PRN

## 2021-03-30 MED ORDER — OXYCODONE HCL 5 MG PO TABS: 5.0000 mg | ORAL_TABLET | Freq: Once | ORAL | Status: DC | PRN

## 2021-03-30 SURGICAL SUPPLY — 54 items
ADH SKN CLS APL DERMABOND .7 (GAUZE/BANDAGES/DRESSINGS) ×1
BUR SABER RD CUTTING 3.0 (BURR) IMPLANT
CANISTER SUCT 3000ML PPV (MISCELLANEOUS) ×2 IMPLANT
COVER SURGICAL LIGHT HANDLE (MISCELLANEOUS) ×2 IMPLANT
COVER WAND RF STERILE (DRAPES) ×2 IMPLANT
DERMABOND ADVANCED (GAUZE/BANDAGES/DRESSINGS) ×1
DERMABOND ADVANCED .7 DNX12 (GAUZE/BANDAGES/DRESSINGS) ×1 IMPLANT
DRAPE HALF SHEET 40X57 (DRAPES) IMPLANT
DRAPE INCISE IOBAN 66X45 STRL (DRAPES) IMPLANT
DRAPE MICROSCOPE LEICA (MISCELLANEOUS) ×2 IMPLANT
DRAPE SURG 17X23 STRL (DRAPES) ×8 IMPLANT
DRSG MEPILEX BORDER 4X4 (GAUZE/BANDAGES/DRESSINGS) IMPLANT
DRSG MEPILEX BORDER 4X8 (GAUZE/BANDAGES/DRESSINGS) ×1 IMPLANT
DURAPREP 26ML APPLICATOR (WOUND CARE) ×2 IMPLANT
ELECT REM PT RETURN 9FT ADLT (ELECTROSURGICAL) ×2
ELECTRODE REM PT RTRN 9FT ADLT (ELECTROSURGICAL) ×1 IMPLANT
EVACUATOR 1/8 PVC DRAIN (DRAIN) IMPLANT
GLOVE ECLIPSE 9.0 STRL (GLOVE) ×2 IMPLANT
GLOVE ORTHO TXT STRL SZ7.5 (GLOVE) ×2 IMPLANT
GLOVE SRG 8 PF TXTR STRL LF DI (GLOVE) ×1 IMPLANT
GLOVE SURG 8.5 LATEX PF (GLOVE) ×2 IMPLANT
GLOVE SURG UNDER POLY LF SZ8 (GLOVE) ×2
GOWN STRL REUS W/ TWL LRG LVL3 (GOWN DISPOSABLE) ×1 IMPLANT
GOWN STRL REUS W/TWL 2XL LVL3 (GOWN DISPOSABLE) ×4 IMPLANT
GOWN STRL REUS W/TWL LRG LVL3 (GOWN DISPOSABLE) ×2
KIT BASIN OR (CUSTOM PROCEDURE TRAY) ×2 IMPLANT
KIT TURNOVER KIT B (KITS) ×2 IMPLANT
NDL SPNL 18GX3.5 QUINCKE PK (NEEDLE) ×2 IMPLANT
NEEDLE SPNL 18GX3.5 QUINCKE PK (NEEDLE) ×4 IMPLANT
NS IRRIG 1000ML POUR BTL (IV SOLUTION) ×2 IMPLANT
PACK LAMINECTOMY ORTHO (CUSTOM PROCEDURE TRAY) ×2 IMPLANT
PAD ARMBOARD 7.5X6 YLW CONV (MISCELLANEOUS) ×4 IMPLANT
PATTIES SURGICAL .5 X.5 (GAUZE/BANDAGES/DRESSINGS) IMPLANT
PATTIES SURGICAL .75X.75 (GAUZE/BANDAGES/DRESSINGS) IMPLANT
PATTIES SURGICAL 1X1 (DISPOSABLE) IMPLANT
SPONGE LAP 4X18 RFD (DISPOSABLE) IMPLANT
SPONGE SURGIFOAM ABS GEL 100 (HEMOSTASIS) ×1 IMPLANT
STAPLER VISISTAT 35W (STAPLE) ×1 IMPLANT
SUT VIC AB 0 CT1 18XCR BRD 8 (SUTURE) IMPLANT
SUT VIC AB 0 CT1 27 (SUTURE)
SUT VIC AB 0 CT1 27XBRD ANBCTR (SUTURE) IMPLANT
SUT VIC AB 0 CT1 8-18 (SUTURE) ×2
SUT VIC AB 1 CT1 27 (SUTURE)
SUT VIC AB 1 CT1 27XBRD ANBCTR (SUTURE) IMPLANT
SUT VIC AB 2-0 CT1 27 (SUTURE) ×2
SUT VIC AB 2-0 CT1 TAPERPNT 27 (SUTURE) IMPLANT
SUT VIC AB 3-0 PS2 18 (SUTURE) ×2
SUT VIC AB 3-0 PS2 18XBRD (SUTURE) IMPLANT
SUT VIC AB 3-0 X1 27 (SUTURE) ×2 IMPLANT
SUT VICRYL 0 UR6 27IN ABS (SUTURE) ×2 IMPLANT
TOWEL GREEN STERILE (TOWEL DISPOSABLE) ×2 IMPLANT
TOWEL GREEN STERILE FF (TOWEL DISPOSABLE) ×2 IMPLANT
TRAY FOLEY MTR SLVR 16FR STAT (SET/KITS/TRAYS/PACK) IMPLANT
WATER STERILE IRR 1000ML POUR (IV SOLUTION) ×2 IMPLANT

## 2021-03-30 NOTE — Anesthesia Preprocedure Evaluation (Signed)
Anesthesia Evaluation  Patient identified by MRN, date of birth, ID band Patient awake    Reviewed: Allergy & Precautions, NPO status , Patient's Chart, lab work & pertinent test results  History of Anesthesia Complications Negative for: history of anesthetic complications  Airway Mallampati: II  TM Distance: >3 FB Neck ROM: Full    Dental  (+) Dental Advisory Given   Pulmonary former smoker,  03/30/2021 SARS coronavirus NEG   breath sounds clear to auscultation       Cardiovascular hypertension, + CAD  + dysrhythmias Supra Ventricular Tachycardia  Rhythm:Regular Rate:Normal  '17 cath: 20% RCA, 50% OM, 35% mid LCx, 70% D1, 65% mid LAD, 70% distal LAD 80% on medical management   07/2020 Stress:  EF: 52%.  There was no ST segment deviation noted during stress.  No T wave inversion was noted during stress.  The study is normal.  This is a low risk study  '19 ECHO: EF 60% to 65%. Wall motion was normal;  no regional wall motion abnormalities. grade 1 diastolic dysfunction). Mildly to moderately calcified annulus. Mildly  thickened leaflets, mild AI   Neuro/Psych Chronic back pain    GI/Hepatic Neg liver ROS, GERD  Controlled,  Endo/Other  negative endocrine ROS  Renal/GU negative Renal ROS     Musculoskeletal  (+) Arthritis ,   Abdominal   Peds  Hematology negative hematology ROS (+)   Anesthesia Other Findings   Reproductive/Obstetrics                             Anesthesia Physical Anesthesia Plan  ASA: III  Anesthesia Plan: General   Post-op Pain Management:    Induction: Intravenous  PONV Risk Score and Plan: 2 and Ondansetron and Dexamethasone  Airway Management Planned: Oral ETT  Additional Equipment: None  Intra-op Plan:   Post-operative Plan: Extubation in OR  Informed Consent: I have reviewed the patients History and Physical, chart, labs and discussed the  procedure including the risks, benefits and alternatives for the proposed anesthesia with the patient or authorized representative who has indicated his/her understanding and acceptance.     Dental advisory given  Plan Discussed with: CRNA and Surgeon  Anesthesia Plan Comments:         Anesthesia Quick Evaluation

## 2021-03-30 NOTE — Anesthesia Procedure Notes (Signed)
Procedure Name: Intubation Date/Time: 03/30/2021 1:11 PM Performed by: Trinna Post., CRNA Pre-anesthesia Checklist: Patient identified, Emergency Drugs available, Suction available, Patient being monitored and Timeout performed Patient Re-evaluated:Patient Re-evaluated prior to induction Oxygen Delivery Method: Circle system utilized Preoxygenation: Pre-oxygenation with 100% oxygen Induction Type: IV induction Ventilation: Mask ventilation without difficulty Laryngoscope Size: Mac and 4 Grade View: Grade I Tube type: Oral Tube size: 7.5 mm Number of attempts: 1 Airway Equipment and Method: Stylet Placement Confirmation: ETT inserted through vocal cords under direct vision,  positive ETCO2 and breath sounds checked- equal and bilateral Secured at: 22 cm Tube secured with: Tape Dental Injury: Teeth and Oropharynx as per pre-operative assessment

## 2021-03-30 NOTE — Transfer of Care (Signed)
Immediate Anesthesia Transfer of Care Note  Patient: HERCULES HASLER  Procedure(s) Performed: LEFT L3 HEMILAMINECTOMY WITH EXCISION OF DISC HERNIATION (N/A )  Patient Location: PACU  Anesthesia Type:General  Level of Consciousness: awake, alert  and oriented  Airway & Oxygen Therapy: Patient Spontanous Breathing and Patient connected to nasal cannula oxygen  Post-op Assessment: Report given to RN, Post -op Vital signs reviewed and stable and Patient moving all extremities X 4  Post vital signs: Reviewed and stable  Last Vitals:  Vitals Value Taken Time  BP    Temp    Pulse 61 03/30/21 1506  Resp 20 03/30/21 1506  SpO2 100 % 03/30/21 1506  Vitals shown include unvalidated device data.  Last Pain:  Vitals:   03/30/21 1139  TempSrc:   PainSc: 4       Patients Stated Pain Goal: 2 (62/[id number]/76 [id number])  Complications: No complications documented.

## 2021-03-30 NOTE — Anesthesia Postprocedure Evaluation (Signed)
Anesthesia Post Note  Patient: Christian [name]  Procedure(s) Performed: LEFT L3 HEMILAMINECTOMY WITH EXCISION OF DISC HERNIATION (N/A )     Patient location during evaluation: PACU Anesthesia Type: General Level of consciousness: awake and alert, patient cooperative and oriented Pain management: pain level controlled Vital Signs Assessment: post-procedure vital signs reviewed and stable Respiratory status: spontaneous breathing, nonlabored ventilation and respiratory function stable Cardiovascular status: blood pressure returned to baseline and stable Postop Assessment: no apparent nausea or vomiting Anesthetic complications: no   No complications documented.  Last Vitals:  Vitals:   03/30/21 1507 03/30/21 1521  BP: 131/82 129/90  Pulse: 61 61  Resp: 20 12  Temp: (!) 36.2 C   SpO2: 100% 100%    Last Pain:  Vitals:   03/30/21 1521  TempSrc:   PainSc: 0-No pain    LLE Motor Response: Purposeful movement (03/30/21 1521) LLE Sensation: Full sensation (03/30/21 1521) RLE Motor Response: Purposeful movement (03/30/21 1521) RLE Sensation: Full sensation (03/30/21 1521)      Seleta Rhymes. Tamrah Victorino

## 2021-03-30 NOTE — Brief Op Note (Signed)
03/30/2021  2:48 PM  PATIENT:  Christian [name]  75 y.o. male  PRE-OPERATIVE DIAGNOSIS:  left L2-3 herniated disc with extrusion over posterior left L3 vertebral body  POST-OPERATIVE DIAGNOSIS:  left L2-3 herniated disc with extrusion over posterior left L3 vertebral body  PROCEDURE:  Procedure(s): LEFT L3 HEMILAMINECTOMY WITH EXCISION OF DISC HERNIATION (N/A)  SURGEON:  Surgeon(s) and Role:    * Jessy Oto, MD - Primary  PHYSICIAN ASSISTANT: Benjiman Core, PA-C  ANESTHESIA:   local and general , Dr. Glennon Mac  EBL: 100  BLOOD ADMINISTERED:none  DRAINS: none   LOCAL MEDICATIONS USED:  MARCAINE 0.5% 1:1 EXPAREL 1.3%   Amount: 20 ml  SPECIMEN:  No Specimen  DISPOSITION OF SPECIMEN:  N/A  COUNTS:  YES  TOURNIQUET:  * No tourniquets in log *  DICTATION: .Dragon Dictation  PLAN OF CARE: Admit for overnight observation  PATIENT DISPOSITION:  PACU - hemodynamically stable.   Delay start of Pharmacological VTE agent (>24hrs) due to surgical blood loss or risk of bleeding: yes

## 2021-03-30 NOTE — Op Note (Signed)
03/30/2021  2:52 PM  PATIENT:  Christian [name]  75 y.o. male  MRN: [id number]  OPERATIVE REPORT  PRE-OPERATIVE DIAGNOSIS:  left L2-3 herniated disc with extrusion over posterior left L3 vertebral body  POST-OPERATIVE DIAGNOSIS:  left L2-3 herniated disc with extrusion over posterior left L3 vertebral body  PROCEDURE:  Procedure(s): LEFT L3 HEMILAMINECTOMY WITH EXCISION OF DISC HERNIATION    SURGEON:  Kerrin Champagne, MD     ASSISTANT:  Zonia Kief, PA-C  (Present throughout the entire procedure and necessary for completion of procedure in a timely manner)     ANESTHESIA:  General,    COMPLICATIONS:  None.     FINDINGS: Left sided herniated disc L2-3 with impression on the left L3 nerve root entry point in to the neuroforamen the extruded fragments tethering the L3 nerve over the medial inferior L3 pedicle.   PROCEDURE:The patient was met in the holding area, and the appropriate Left Lumbar level L2-3 identified and marked with "x" and my initials.The patient was then transported to OR and was placed under general anesthesia without difficulty. The patient received appropriate preoperative antibiotic prophylaxis. The patient after intubation atraumatically was transferred to the operating room table, prone position, Wilson frame, sliding OR table. All pressure points were well padded. The arms in 90-90 well-padded at the elbows. Standard prep with CHG solution lower dorsal spine to the mid sacral segment. Draped in the usual manner clear Vi-Drape was used. Time-out procedure was called and correct. 2x 18-gauge spinal needle was then inserted at the expected L2-3 level. Cross table lateral radiograph used to identify the spinal needles positions. The lower needle was at the lower aspect of the lamina of L3. The upper needle over lying the L2 lamina.  Skin inferior to this was then infiltrated with marcaine half percent with 1:1 exparel 1.3% total of 10 cc used. An incision approximately an  inch inch and a half in length was then made through skin and subcutaneous layers in line with the left side of the expected midline just superior to the spinal needle entry point. An incision made into the left lumbosacral fascia approximately an inch and one half in length .  Cobb elevator was then introduced into the incision site and used to carefully form subperiosteal dissection of the paralumbar muscles off of the posterior lamina of the expected L2-3 level. The depth measured off of the Cobb at about 50 mm and 50 mm retractors then placed on the scaffolding for the [facility], The equipment and guided down to and docking on the posterior aspect of the lamina at the expected L2-3 level.Cross table radiograph dwas identified the retractors at the appropriate level L2-3 with a Kocher clamp at the interspinous area between L2 and L3.. The operating room microscope sterilely draped brought into the field. Under the operating room microscope, the L2-3 interspace carefully debrided the small amount of muscle attachment here and high-speed bur used to drill the medial aspect of the inferior articular process of L2 approximately 20%. 2 mm Kerrison then used to enter the spinal canal over the superior aspect of the L3 lamina carefully using the Kerrison to debris the attachment as a curet. Foraminotomy was then performed over the left L3 nerve root. The medial 10% superior articular process of L3 and then resected using 2 mm Kerrison. This allowed for identification of the thecal sac. Penfield 4 was then used to carefully mobilize the thecal sac medially and the L3 nerve root identified within the lateral  recess flattened over the posterior aspect of the protruded disc. Carefully the lateral aspect of the L3 nerve root was identified and a Penfield 4 was used to mobilize the nerve medially such that the protruded disc was visible with microscope. Using a Penfield 4 for retraction and a 15 blade scalpel was used to  incise the posterior longitudinal ligament within the lateral recess on the left side longitudinally. Disc material was removed using micropituitary rongeurs and nerve hook nerve root and then more easily able to be mobilized medially and retracted using a Derricho retractor. Further foraminotomies was performed over the L3 nerve root the nerve root was noted to be X. without further compression. The nerve root able to be retracted along the medial aspect of the L3 pedicle and disc material found to be subligamentous at this level was further resected current pituitary rongeurs.  Ligamentum flavum was further debrided superiorly to the level L2-3  inferiorly and medially was performed. With this then the disc space at L2-3 was easily visualized and entry into the disc at the sided disc herniation was possible using a Penfield 4 intraoperative Lateral radiograph was used to identify the L2-3 disc with the Penfield 4 In place just below the disc space. Micropituitary was used to further debride this material superficially from the posterior aspect of the intervertebral disc is posterior lateral aspect of the disc. Small amount of further disc material was found subligamentous extending laterally from the disc this was removed using micropituitary rongeurs into the left L3 neuroforamen additionally epstein  currettes were used to decompress the left L2 neuroforamen. Upbiting currettes were used to further remove reflected portions of the reflected portions of the medial L2-3 facet and portions of the superior portion of the L3 superior articular  Facet. Ligamentum flavum was debrided and lateral recess along the medial aspect L2-3 facet no further decompression was necessary. Ball tip nerve probe was then able to carefully palpate the neuroforamen for L2 and L3 finding these to be well decompressed. Bleeding was then controlled using thrombin-soaked Gelfoam small cottonoids. Small amount of bleeding within the soft  tissue mass the laminotomy area was controlled using bipolar electrocautery. Irrigation was carried out using copious amounts of irrigant solution. All Gelfoam were then removed. No significant active bleeding present at the time of removal. All instruments sponge counts were correct traction system was then carefully removed carefully rotating retractors with this withdrawal and only bipolar electrocautery of any small bleeders. Lumbodorsal fascia was then carefully approximated with interrupted 0 Vicryl sutures, UR 6 needle deep subcutaneous layers were approximated with interrupted 0 Vicryl sutures on UR 6 the appear subcutaneous layers approximated with interrupted 2-0 Vicryl sutures and the skin closed with a running subcutaneous stitch of 4-0 Vicryl. Dermabond was applied allowed to dry and then Mepilex bandage applied. Patient was then carefully returned to supine position on a stretcher, reactivated and extubated. He was then returned to recovery room in satisfactory condition. Benjiman Core PA-C perform the duties of assistant surgeon during this case. he was present from the beginning of the case to the end of the case assisting in transfer the patient from his stretcher to the OR table and back to the stretcher at the end of the case. Assisted in careful retraction and suction of the laminectomy site delicate neural structures operating under the operating room microscope. he performed closure of the incision from the fascia to the skin applying the dressing.     Basil Dess  03/30/2021, 2:52  PM

## 2021-03-30 NOTE — H&P (Signed)
PREOPERATIVE H&P  Chief Complaint: left L2-3 herniated disc with extrusion over posterior left L3 vertebral body  HPI: Christian [name] is a 75 y.o. male who presents for preoperative history and physical with a diagnosis of left L2-3 herniated disc with extrusion over posterior left L3 vertebral body. Symptoms are rated as moderate to severe, and have been worsening.  This is significantly impairing activities of daily living.  He has elected for surgical management.  DATA:  Lumbar radiculopathy. Low back pain radiating to the left hip and leg for 63 month 75 year old male with a 4 month history of back pain left lower lumbar with radiation into the left anterior thigh to the left knee. He is experiencing worsening pain with standing and walking. No bowel or bladder difficulty. Seen last week and evaluated, recommended Attempted ESI via Transforaminal approach but this does not seem to be helping.  Pain improves with sitting and stooping. Underwent ESI s last one one week ago Thursday 3/31 with no relief. The MRI of the lumbar spine demonstrates a left L2-3 HNP with extrusion of disc over the left posterior L3 vertebral body casing left L3 nerve compression with extension of the disc herniation into the left L3 neuroforamen. He has a history of CAD, sees [name] who has been mainly concerned about his HTN and obesity. He has since lost 80 lbs and no longer requires antihypertensive agents. He has CAD that is not reconstructable disease. His EF was 60-65 percent on stress testing.     Past Medical History:  Diagnosis Date  . Arthritis    "joints; shoulders; left elbow; right thumb" (05/06/2014)  . Atrial tachycardia ([place])   . Breathing problem   . Cataracts, bilateral    immature  . Chest pain   . Constipation    takes Colace and Metamucil daily  . Coronary artery disease 02/2011   cath 11/2016 showing 20% RCA, 50% OM, 35% mid LCx, 70% D1, 65% mid LAD, 70% distal LAD 80% on medical  management  . Diastolic dysfunction   . GERD (gastroesophageal reflux disease)   . Heart murmur   . Hemorrhoids   . History of bronchitis   . Hypercholesterolemia    takes Crestor daily  . Hypertension   . Joint pain   . Joint pain   . Nocturia   . Orthostatic hypotension   . Palpitations   . Premature atrial contractions   . PVC's (premature ventricular contractions)   . Restless leg syndrome   . Rheumatoid arthritis ([place])   . Sciatica   . Sinus bradycardia    Past Surgical History:  Procedure Laterality Date  . ACHILLES TENDON REPAIR Left    "& fixed a spur"  . CARDIAC CATHETERIZATION  2012  . CARDIAC CATHETERIZATION N/A 12/06/2016   Procedure: Left Heart Cath and Coronary Angiography;  Surgeon: Belva Crome, MD;  Location: [facility];  Service: Cardiovascular;  Laterality: N/A;  . COLONOSCOPY    . FLEXIBLE SIGMOIDOSCOPY  X 2  . HAND SURGERY Left    "thumb; cleaned out arthritis"  . HEEL SPUR EXCISION Left   . KNEE ARTHROSCOPY Right   . SHOULDER SURGERY Right X 2   "cleaned out spurs"  . TOE FUSION Right    great toe; plates and screws  . TONSILLECTOMY  ~ 1950  . TOTAL HIP ARTHROPLASTY Right 09/22/2015   Procedure: TOTAL HIP ARTHROPLASTY;  Surgeon: Garald Balding, MD;  Location: [facility];  Service: Orthopedics;  Laterality: Right;  . TOTAL KNEE ARTHROPLASTY Right 05/06/2014  . TOTAL KNEE ARTHROPLASTY Right 05/06/2014   Procedure: RIGHT TOTAL KNEE ARTHROPLASTY;  Surgeon: Garald Balding, MD;  Location: [facility];  Service: Orthopedics;  Laterality: Right;  . TOTAL KNEE ARTHROPLASTY Left 02/05/2019   Procedure: LEFT TOTAL KNEE ARTHROPLASTY;  Surgeon: Garald Balding, MD;  Location: [facility];  Service: Orthopedics;  Laterality: Left;   Social History   Socioeconomic History  . Marital status: Married    Spouse name: Junious Dresser  . Number of children: Not on file  . Years of education: Not on file  . Highest education level: Not on file  Occupational History  .  Occupation: Retired  Tobacco Use  . Smoking status: Former Smoker    Packs/day: 2.00    Years: 10.00    Pack years: 20.00    Types: Cigarettes    Quit date: 1981    Years since quitting: 41.2  . Smokeless tobacco: Never Used  . Tobacco comment: quit smoking in 1981  Vaping Use  . Vaping Use: Never used  Substance and Sexual Activity  . Alcohol use: Yes    Comment: "drink a beer sometimes once/month"  . Drug use: No  . Sexual activity: Never  Other Topics Concern  . Not on file  Social History Narrative  . Not on file   Social Determinants of Health   Financial Resource Strain: Not on file  Food Insecurity: Not on file  Transportation Needs: Not on file  Physical Activity: Not on file  Stress: Not on file  Social Connections: Not on file   Family History  Problem Relation Age of Onset  . CVA Mother   . Stroke Mother   . CVA Father   . Heart disease Father   . High blood pressure Father   . High Cholesterol Father   . Stroke Father    Allergies  Allergen Reactions  . Atorvastatin Calcium [Atorvastatin] Other (See Comments)    Myalgia  . Simvastatin Other (See Comments)    Myalgia  . Pravastatin Other (See Comments)    Leg Cramps   Prior to Admission medications   Medication Sig Start Date End Date Taking? Authorizing Provider  acetaminophen (TYLENOL) 500 MG tablet Take 1,000 mg by mouth every 8 (eight) hours as needed for moderate pain.   Yes [provider]  aspirin 81 MG tablet Take 81 mg by mouth daily.   Yes [provider]  cholecalciferol (VITAMIN D3) 25 MCG (1000 UNIT) tablet Take 1,000 Units by mouth daily.   Yes [provider]  docusate sodium (COLACE) 100 MG capsule Take 300 mg by mouth daily.   Yes [provider]  furosemide (LASIX) 20 MG tablet TAKE 1 TABLET BY MOUTH EVERY MON, WED, FRI Patient taking differently: Take 20 mg by mouth See admin instructions. TAKE 1 TABLET BY MOUTH EVERY MON, WED, FRI 11/16/20   Yes Turner, Traci R, MD  isosorbide mononitrate (IMDUR) 60 MG 24 hr tablet TAKE 1 TABLET BY MOUTH EVERY DAY Patient taking differently: Take 60 mg by mouth daily. 03/01/21  Yes Turner, Traci R, MD  rosuvastatin (CRESTOR) 20 MG tablet TAKE 1 TABLET BY MOUTH EVERY DAY Patient taking differently: Take by mouth daily. 03/01/21  Yes Sueanne Margarita, MD  COVID-19 mRNA vaccine, Pfizer, 30 MCG/0.3ML injection INJECT AS DIRECTED 09/28/20 09/28/21  Carlyle Basques, MD  traMADol (ULTRAM) 50 MG tablet Take 1 tablet (50 mg total) by mouth every 12 (twelve)  hours as needed. Patient not taking: Reported on 03/29/2021 01/15/21   Garald Balding, MD  traMADol (ULTRAM) 50 MG tablet Take 1 tablet (50 mg total) by mouth every 12 (twelve) hours as needed. Patient not taking: Reported on 03/29/2021 01/15/21   Garald Balding, MD     Positive ROS: All other systems have been reviewed and were otherwise negative with the exception of those mentioned in the HPI and as above.  Physical Exam: General: Alert, no acute distress Cardiovascular: No pedal edema Respiratory: No cyanosis, no use of accessory musculature GI: No organomegaly, abdomen is soft and non-tender Skin: No lesions in the area of chief complaint Neurologic: Sensation intact distally Psychiatric: Patient is competent for consent with normal mood and affect Lymphatic: No axillary or cervical lymphadenopathy  MUSCULOSKELETAL: Left Quadriceps strength 4/5 Reverse SLR(femoral stretch test)positive left side. Ambulates with limp left side. Left foot DF is 5-/5  DATA:  Lumbar radiculopathy. Low back pain radiating to the left hip and leg for 2 months.  EXAM: MRI LUMBAR SPINE WITHOUT CONTRAST  TECHNIQUE: Multiplanar, multisequence MR imaging of the lumbar spine was performed. No intravenous contrast was administered.  COMPARISON:  Lumbar spine radiographs 12/16/2020  FINDINGS: Segmentation:  Standard.  Alignment:  Normal.  Vertebrae: Mild  edema in the region of the posterior pedicle/pars interarticularis on the right at L5, potentially reactive as a discrete fracture is not identified. Preserved vertebral body heights. No suspicious marrow lesion.  Conus medullaris and cauda equina: Conus extends to the T12-L1 level. Conus and cauda equina appear normal.  Paraspinal and other soft tissues: Partially visualized renal cysts including a 5.5 cm cyst on the right and numerous smaller cysts bilaterally.  Disc levels:  Disc desiccation throughout the lumbar spine. Moderate disc space narrowing at L5-S1.  T12-L1: Mild facet and ligamentum flavum hypertrophy without disc herniation or stenosis.  L1-2: Minimal disc bulging and mild facet and ligamentum flavum hypertrophy without stenosis.  L2-3: Circumferential disc bulging, a small left paracentral and subarticular disc extrusion with migration to the inferior L3 vertebral body level, and moderate facet and ligamentum flavum hypertrophy result in mild spinal stenosis and mild right and moderate left lateral recess stenosis with the disc extrusion likely resulting in left L3 nerve root impingement. Patent neural foramina.  L3-4: Mild disc bulging, small right central disc protrusion, and moderate facet and ligamentum flavum hypertrophy without significant stenosis.  L4-5: Minimal disc bulging, a small left foraminal disc protrusion, and mild-to-moderate facet and ligamentum flavum hypertrophy result in mild left neural foraminal stenosis without spinal stenosis.  L5-S1: Mild disc bulging and a shallow central disc protrusion with annular fissure without stenosis.  IMPRESSION: 1. Disc extrusion at L2-3 resulting in left lateral recess stenosis and left L3 nerve root impingement. 2. Mild left neural foraminal stenosis at L4-5.   Electronically Signed   By: Logan Bores M.D.   On: 01/25/2021 15:00   .     Assessment: left L2-3 herniated  disc with extrusion over posterior left L3 vertebral body  Plan: Plan for Procedure(s): LEFT L3 HEMILAMINECTOMY WITH EXCISION OF DISC HERNIATION  The risks benefits and alternatives were discussed with the patient including but not limited to the risks of nonoperative treatment, versus surgical intervention including infection, bleeding, nerve injury,  blood clots, cardiopulmonary complications, morbidity, mortality, among others, and they were willing to proceed.   Basil Dess, MD Cell [phone] Office [phone] 03/30/2021 6:07 AM

## 2021-03-31 ENCOUNTER — Encounter (HOSPITAL_COMMUNITY): Payer: Self-pay | Admitting: Specialist

## 2021-03-31 DIAGNOSIS — M5116 Intervertebral disc disorders with radiculopathy, lumbar region: Secondary | ICD-10-CM | POA: Diagnosis not present

## 2021-03-31 DIAGNOSIS — I10 Essential (primary) hypertension: Secondary | ICD-10-CM | POA: Diagnosis not present

## 2021-03-31 DIAGNOSIS — Z96641 Presence of right artificial hip joint: Secondary | ICD-10-CM | POA: Diagnosis not present

## 2021-03-31 DIAGNOSIS — Z20822 Contact with and (suspected) exposure to covid-19: Secondary | ICD-10-CM | POA: Diagnosis not present

## 2021-03-31 DIAGNOSIS — I2510 Atherosclerotic heart disease of native coronary artery without angina pectoris: Secondary | ICD-10-CM | POA: Diagnosis not present

## 2021-03-31 DIAGNOSIS — Z96653 Presence of artificial knee joint, bilateral: Secondary | ICD-10-CM | POA: Diagnosis not present

## 2021-03-31 MED ORDER — HYDROCODONE-ACETAMINOPHEN 7.5-325 MG PO TABS: 1.0000 | ORAL_TABLET | ORAL | 0 refills | Status: AC | PRN

## 2021-03-31 MED ORDER — DOCUSATE SODIUM 100 MG PO CAPS: 100.0000 mg | ORAL_CAPSULE | Freq: Two times a day (BID) | ORAL | 0 refills | Status: DC

## 2021-03-31 MED ORDER — METHOCARBAMOL 500 MG PO TABS: 500.0000 mg | ORAL_TABLET | Freq: Four times a day (QID) | ORAL | 1 refills | Status: DC | PRN

## 2021-03-31 NOTE — Progress Notes (Signed)
     Subjective: 1 Day Post-Op Procedure(s) (LRB): LEFT L3 HEMILAMINECTOMY WITH EXCISION OF DISC HERNIATION (N/A) Awake, alert and oriented x 4. Walking in hallway, voiding with out difficulty. Tolerating pos nourishment and narcotic pain relievers.   Patient reports pain as moderate.    Objective:   VITALS:  Temp:  [97.1 F (36.2 C)-98.4 F (36.9 C)] 97.6 F (36.4 C) (04/06 0741) Pulse Rate:  [53-66] 64 (04/06 0741) Resp:  [10-20] 18 (04/06 0741) BP: (98-147)/(63-90) 98/68 (04/06 0741) SpO2:  [97 %-100 %] 99 % (04/06 0741) Weight:  [90.7 kg] 90.7 kg (04/05 1028)  Neurologically intact ABD soft Neurovascular intact Sensation intact distally Intact pulses distally Dorsiflexion/Plantar flexion intact Incision: dressing C/D/I and scant drainage   LABS Recent Labs    03/30/21 1058  HGB 14.1  WBC 6.0  PLT 140*   Recent Labs    03/30/21 1058  NA 137  K 3.8  CL 109  CO2 25  BUN 18  CREATININE 0.89  GLUCOSE 94   No results for input(s): LABPT, INR in the last 72 hours.   Assessment/Plan: 1 Day Post-Op Procedure(s) (LRB): LEFT L3 HEMILAMINECTOMY WITH EXCISION OF DISC HERNIATION (N/A)  Advance diet Up with therapy Discharge home with home health  Basil Dess 03/31/2021, 8:39 AMPatient ID: Christian [name], male   DOB: [birth year], 75 y.o.   MRN: [id number]

## 2021-03-31 NOTE — Evaluation (Signed)
Physical Therapy Evaluation Patient Details Name: Christian [name] MRN: [id number] DOB: [birth year] Today's Date: 03/31/2021   History of Present Illness  Pt is a 75 y/o male who presents s/p L2-3 herniated disc s/p laminectomy/decompression on 03/30/2021. PMH significant for bradycardia, RA, PVC's, premature atrial contractions, orthostatic hypotension, HTN, heart murmur, CAD, atrial tachycardia, B THR, R THR.  Clinical Impression  Pt admitted with above diagnosis. At the time of PT eval, pt was able to demonstrate transfers and ambulation with up to min guard assist and no AD required for support. Pt was educated on precautions, positioning recommendations, appropriate activity progression, and car transfer. Pt currently with functional limitations due to the deficits listed below (see PT Problem List). Pt will benefit from skilled PT to increase their independence and safety with mobility to allow discharge to the venue listed below.      Follow Up Recommendations No PT follow up;Supervision for mobility/OOB    Equipment Recommendations  None recommended by PT    Recommendations for Other Services       Precautions / Restrictions Precautions Precautions: Fall;Back Precaution Booklet Issued: Yes (comment) Precaution Comments: Reviewed handout and pt was cued for precautions during functional mobility. Required Braces or Orthoses:  (No brace needed order) Restrictions Weight Bearing Restrictions: No      Mobility  Bed Mobility Overal bed mobility: Modified Independent Bed Mobility: Rolling;Sidelying to Sit;Sit to Sidelying           General bed mobility comments: No assist required. HOB flat and rails lowered to simulate home environment. VC's throughout for propr log roll technique.    Transfers Overall transfer level: Needs assistance Equipment used: None Transfers: Sit to/from Stand Sit to Stand: Supervision;Min guard         General transfer comment: Min guard  progressing to supervision by end of session. Unsteadiness noted but no physical assist required.  Ambulation/Gait Ambulation/Gait assistance: Min guard;Supervision Gait Distance (Feet): 300 Feet Assistive device: None Gait Pattern/deviations: Step-through pattern;Decreased stride length;Trunk flexed Gait velocity: Decreased Gait velocity interpretation: 1.31 - 2.62 ft/sec, indicative of limited community ambulator General Gait Details: VC's for improved posture and general safety. Pt ambulating well with mild lateral deviations/drifting but no overt LOB.  Stairs Stairs: Yes Stairs assistance: Min guard Stair Management: One rail Left;Step to pattern;Forwards;Alternating pattern Number of Stairs: 5 General stair comments: alternating step pattern ascending and step-to pattern to descend.  Wheelchair Mobility    Modified Rankin (Stroke Patients Only)       Balance Overall balance assessment: Mild deficits observed, not formally tested                                           Pertinent Vitals/Pain Pain Assessment: Faces Faces Pain Scale: Hurts little more Pain Location: incision site Pain Descriptors / Indicators: Operative site guarding Pain Intervention(s): Limited activity within patient's tolerance;Monitored during session;Repositioned    Home Living Family/patient expects to be discharged to:: Private residence Living Arrangements: Spouse/significant other Available Help at Discharge: Family;Available 24 hours/day Type of Home: House Home Access: Stairs to enter Entrance Stairs-Rails: Right;Left;Can reach both Entrance Stairs-Number of Steps: 3 Home Layout: Two level;Able to live on main level with bedroom/bathroom Home Equipment: Gilford Rile - 2 wheels;Crutches;Shower seat;Wheelchair - manual      Prior Function Level of Independence: Independent         Comments: Active, walking for exercise about  an hour a day     Hand Dominance    Dominant Hand: Right    Extremity/Trunk Assessment   Upper Extremity Assessment Upper Extremity Assessment: Defer to OT evaluation    Lower Extremity Assessment Lower Extremity Assessment: Overall WFL for tasks assessed (mild weakness noted)    Cervical / Trunk Assessment Cervical / Trunk Assessment: Other exceptions Cervical / Trunk Exceptions: s/p surgery  Communication   Communication: No difficulties  Cognition Arousal/Alertness: Awake/alert Behavior During Therapy: WFL for tasks assessed/performed Overall Cognitive Status: Within Functional Limits for tasks assessed                                        General Comments      Exercises     Assessment/Plan    PT Assessment Patient needs continued PT services  PT Problem List Decreased strength;Decreased activity tolerance;Decreased balance;Decreased mobility;Decreased knowledge of use of DME;Decreased safety awareness;Decreased knowledge of precautions;Pain       PT Treatment Interventions DME instruction;Gait training;Stair training;Functional mobility training;Therapeutic activities;Therapeutic exercise;Neuromuscular re-education;Patient/family education    PT Goals (Current goals can be found in the Care Plan section)  Acute Rehab PT Goals Patient Stated Goal: Home today PT Goal Formulation: With patient/family Time For Goal Achievement: 04/07/21 Potential to Achieve Goals: Good    Frequency Min 5X/week   Barriers to discharge        Co-evaluation               AM-PAC PT "6 Clicks" Mobility  Outcome Measure Help needed turning from your back to your side while in a flat bed without using bedrails?: None Help needed moving from lying on your back to sitting on the side of a flat bed without using bedrails?: None Help needed moving to and from a bed to a chair (including a wheelchair)?: A Little Help needed standing up from a chair using your arms (e.g., wheelchair or bedside  chair)?: A Little Help needed to walk in hospital room?: A Little Help needed climbing 3-5 steps with a railing? : A Little 6 Click Score: 20    End of Session Equipment Utilized During Treatment: Gait belt Activity Tolerance: Patient tolerated treatment well Patient left: in bed;with call bell/phone within reach;with family/visitor present Nurse Communication: Mobility status PT Visit Diagnosis: Unsteadiness on feet (R26.81);Pain Pain - part of body:  (back)    Time: 2549-8264 PT Time Calculation (min) (ACUTE ONLY): 31 min   Charges:   PT Evaluation $PT Eval Low Complexity: 1 Low PT Treatments $Gait Training: 8-22 mins        Rolinda Roan, PT, DPT Acute Rehabilitation Services Pager: [phone] Office: [phone]   Thelma Comp 03/31/2021, 9:36 AM

## 2021-03-31 NOTE — Evaluation (Signed)
Occupational Therapy Evaluation Patient Details Name: Christian [name] MRN: [id number] DOB: 04-Aug-1946 Today's Date: 03/31/2021    History of Present Illness Pt is a 75 y/o male who presents s/p L2-3 herniated disc s/p laminectomy/decompression on 03/30/2021. PMH significant for bradycardia, RA, PVC's, premature atrial contractions, orthostatic hypotension, HTN, heart murmur, CAD, atrial tachycardia, B THR, R THR.   Clinical Impression   PTA patient independent and driving. Admitted for above and limited by problem list below, including pain, precautions.  He was educated on precautions, ADL compensatory techniques, recommendations, AE/DME and mobility progression.  He currently requires up to min assist for LB ADLs, supervision for UB ADLs and transfers/in room mobility without AD.  He will have support of his spouse at dc for LB bathing/dressing as needed (or plans to use AE as pt has used these in the past and if he cannot find them, plans to purchase again). Based on performance today, no further OT needs have been identified and OT will sign off.  Thank you for this referral!     Follow Up Recommendations  No OT follow up;Supervision - Intermittent    Equipment Recommendations  None recommended by OT    Recommendations for Other Services       Precautions / Restrictions Precautions Precautions: Fall;Back Precaution Booklet Issued: Yes (comment) Precaution Comments: Reviewed handout and pt was cued for precautions during ADLs Required Braces or Orthoses:  (No brace needed order) Restrictions Weight Bearing Restrictions: No      Mobility Bed Mobility Overal bed mobility: Modified Independent Bed Mobility: Rolling;Sidelying to Sit;Sit to Sidelying           General bed mobility comments: No assist required. good recall and log roll technique    Transfers Overall transfer level: Needs assistance Equipment used: None Transfers: Sit to/from Stand Sit to Stand: Supervision          General transfer comment: supervision for safety, good posture and technique    Balance Overall balance assessment: Mild deficits observed, not formally tested                                         ADL either performed or assessed with clinical judgement   ADL Overall ADL's : Needs assistance/impaired     Grooming: Supervision/safety;Standing       Lower Body Bathing: Minimal assistance;Sit to/from stand;Cueing for compensatory techniques;Cueing for back precautions;With adaptive equipment Lower Body Bathing Details (indicate cue type and reason): reviewed compensatory techniques, use of long sponge/assist from spouse as needed sitting for safety Upper Body Dressing : Set up;Sitting   Lower Body Dressing: Minimal assistance;Sit to/from stand;Cueing for back precautions;Cueing for compensatory techniques Lower Body Dressing Details (indicate cue type and reason): reviewed use of AE for LB dressing or compensatory technqiues, spouse plans to assist as needed but today requires assist for socks/shoes Toilet Transfer: Supervision/safety;Ambulation Toilet Transfer Details (indicate cue type and reason): simulated in room   Toileting - Clothing Manipulation Details (indicate cue type and reason): verbally discussed techniques to avoid twisting Tub/ Shower Transfer: Walk-in shower;Supervision/safety;Ambulation;Tub bench Tub/Shower Transfer Details (indicate cue type and reason): simulated in room Functional mobility during ADLs: Supervision/safety General ADL Comments: pt and spouse educated on back precautions, ADL compensatory techniques, DME/AE, and recommendations     Vision         Perception     Praxis      Pertinent  Vitals/Pain Pain Assessment: Faces Faces Pain Scale: Hurts little more Pain Location: incision site Pain Descriptors / Indicators: Operative site guarding Pain Intervention(s): Limited activity within patient's tolerance;Monitored  during session;Repositioned     Hand Dominance Right   Extremity/Trunk Assessment Upper Extremity Assessment Upper Extremity Assessment: Overall WFL for tasks assessed   Lower Extremity Assessment Lower Extremity Assessment: Defer to PT evaluation   Cervical / Trunk Assessment Cervical / Trunk Assessment: Other exceptions Cervical / Trunk Exceptions: s/p surgery   Communication Communication Communication: No difficulties   Cognition Arousal/Alertness: Awake/alert Behavior During Therapy: WFL for tasks assessed/performed Overall Cognitive Status: Within Functional Limits for tasks assessed                                     General Comments       Exercises     Shoulder Instructions      Home Living Family/patient expects to be discharged to:: Private residence Living Arrangements: Spouse/significant other Available Help at Discharge: Family;Available 24 hours/day Type of Home: House Home Access: Stairs to enter [employer] of Steps: 3 Entrance Stairs-Rails: Right;Left;Can reach both Home Layout: Two level;Able to live on main level with bedroom/bathroom Alternate Level Stairs-Number of Steps: flight   Bathroom Shower/Tub: Hospital doctor Toilet: Handicapped height     Home Equipment: Environmental consultant - 2 wheels;Crutches;Wheelchair - manual;Tub bench;Toilet riser          Prior Functioning/Environment Level of Independence: Independent        Comments: Active, walking for exercise about an hour a day        OT Problem List: Decreased activity tolerance;Pain;Decreased knowledge of precautions;Decreased knowledge of use of DME or AE      OT Treatment/Interventions:      OT Goals(Current goals can be found in the care plan section) Acute Rehab OT Goals Patient Stated Goal: Home today OT Goal Formulation: With patient  OT Frequency:     Barriers to D/C:            Co-evaluation              AM-PAC OT "6  Clicks" Daily Activity     Outcome Measure Help from another person eating meals?: None Help from another person taking care of personal grooming?: A Little Help from another person toileting, which includes using toliet, bedpan, or urinal?: A Little Help from another person bathing (including washing, rinsing, drying)?: A Little Help from another person to put on and taking off regular upper body clothing?: A Little Help from another person to put on and taking off regular lower body clothing?: A Little 6 Click Score: 19   End of Session Nurse Communication: Mobility status  Activity Tolerance: Patient tolerated treatment well Patient left: in bed;with call bell/phone within reach;with family/visitor present  OT Visit Diagnosis: Other abnormalities of gait and mobility (R26.89);Pain Pain - part of body:  (back)                Time: 7425-9563 OT Time Calculation (min): 17 min Charges:  OT General Charges $OT Visit: 1 Visit OT Evaluation $OT Eval Low Complexity: 1 Low  Jolaine Artist, OT Acute Rehabilitation Services Pager [phone] Office [phone]   Delight Stare 03/31/2021, 11:04 AM

## 2021-03-31 NOTE — Progress Notes (Signed)
Patient was wheeled down by volunteer for discharge to home; in no acute pain nor distress; all belongings taken along by his wife; discharge instructions given to patient and his wife and both verbalized understanding.

## 2021-04-05 ENCOUNTER — Telehealth (INDEPENDENT_AMBULATORY_CARE_PROVIDER_SITE_OTHER): Payer: Medicare Other | Admitting: Bariatrics

## 2021-04-05 DIAGNOSIS — Z6839 Body mass index (BMI) 39.0-39.9, adult: Secondary | ICD-10-CM | POA: Diagnosis not present

## 2021-04-05 DIAGNOSIS — E7849 Other hyperlipidemia: Secondary | ICD-10-CM | POA: Diagnosis not present

## 2021-04-05 DIAGNOSIS — I10 Essential (primary) hypertension: Secondary | ICD-10-CM

## 2021-04-07 ENCOUNTER — Encounter (INDEPENDENT_AMBULATORY_CARE_PROVIDER_SITE_OTHER): Payer: Self-pay | Admitting: Bariatrics

## 2021-04-07 NOTE — Progress Notes (Signed)
TeleHealth Visit:  Due to the COVID-19 pandemic, this visit was completed with telemedicine (audio/video) technology to reduce patient and provider exposure as well as to preserve personal protective equipment.   Christian [name] has verbally consented to this TeleHealth visit. The patient is located at home, the provider is located at the [facility] office. The participants in this visit include the listed provider and patient. The visit was conducted today via MyChart video.  Chief Complaint: OBESITY Sukhdeep is here to discuss his progress with his obesity treatment plan along with follow-up of his obesity related diagnoses. Nosson is on the Category 1 Plan +100 calories and states he is following his eating plan approximately 95% of the time. Glendon states he is walking for 20 minutes 3 times per week.  Today's visit was #: 13 Starting weight: 278 lbs Starting date: 06/24/2020  Interim History: Esker had back surgery and needed a video visit.  He is doing well and had PT at the hospital.  He has been careful with his calories and is getting protein.  Subjective:   1. Other hyperlipidemia Obryan has hyperlipidemia and has been trying to improve his cholesterol levels with intensive lifestyle modification including a low saturated fat diet, exercise and weight loss. He denies any chest pain, claudication or myalgias.  Taking Crestor.  Lab Results  Component Value Date   ALT 19 03/30/2021   AST 15 03/30/2021   ALKPHOS 42 03/30/2021   BILITOT 0.5 03/30/2021   Lab Results  Component Value Date   CHOL 111 10/05/2020   HDL 46 10/05/2020   LDLCALC 52 10/05/2020   TRIG 55 10/05/2020   CHOLHDL 2.4 06/15/2020   2. Primary hypertension Stable.  Review: taking medications as instructed, no medication side effects noted, no chest pain on exertion, no dyspnea on exertion, no swelling of ankles.   BP Readings from Last 3 Encounters:  03/31/21 98/68  03/29/21 98/67   03/24/21 138/88   Assessment/Plan:   1. Other hyperlipidemia Cardiovascular risk and specific lipid/LDL goals reviewed.  We discussed several lifestyle modifications today and Jontue will continue to work on diet, exercise and weight loss efforts. Orders and follow up as documented in patient record.  Continue Crestor.  Counseling Intensive lifestyle modifications are the first line treatment for this issue. . Dietary changes: Increase soluble fiber. Decrease simple carbohydrates. . Exercise changes: Moderate to vigorous-intensity aerobic activity 150 minutes per week if tolerated. . Lipid-lowering medications: see documented in medical record.  2. Primary hypertension Olivier is working on healthy weight loss and exercise to improve blood pressure control. We will watch for signs of hypotension as he continues his lifestyle modifications.  Monitor blood pressures at home.  3. Obesity, current BMI 28  Zander is currently in the action stage of change. As such, his goal is to continue with weight loss efforts. He has agreed to the Category 1 Plan +100 calories.   He will work on remaining adherent to the plan, mindful eating, and continuing to walk.  Exercise goals: As is.  Continue to walk 3 times per week.  Behavioral modification strategies: increasing lean protein intake, decreasing simple carbohydrates, increasing vegetables, increasing water intake, decreasing eating out, no skipping meals, meal planning and cooking strategies, keeping healthy foods in the home and planning for success.  Eain has agreed to follow-up with our clinic in 2-3 weeks. He was informed of the importance of frequent follow-up visits to maximize his success with intensive lifestyle modifications for his  multiple health conditions.  Objective:   VITALS: Per patient if applicable, see vitals. GENERAL: Alert and in no acute distress. CARDIOPULMONARY: No increased WOB. Speaking in clear sentences.   PSYCH: Pleasant and cooperative. Speech normal rate and rhythm. Affect is appropriate. Insight and judgement are appropriate. Attention is focused, linear, and appropriate.  NEURO: Oriented as arrived to appointment on time with no prompting.   Lab Results  Component Value Date   CREATININE 0.89 03/30/2021   BUN 18 03/30/2021   NA 137 03/30/2021   K 3.8 03/30/2021   CL 109 03/30/2021   CO2 25 03/30/2021   Lab Results  Component Value Date   ALT 19 03/30/2021   AST 15 03/30/2021   ALKPHOS 42 03/30/2021   BILITOT 0.5 03/30/2021   Lab Results  Component Value Date   HGBA1C 5.4 06/24/2020   Lab Results  Component Value Date   INSULIN 4.4 01/26/2021   INSULIN 6.8 10/05/2020   INSULIN 12.1 06/24/2020   Lab Results  Component Value Date   TSH 3.670 06/24/2020   Lab Results  Component Value Date   CHOL 111 10/05/2020   HDL 46 10/05/2020   LDLCALC 52 10/05/2020   TRIG 55 10/05/2020   CHOLHDL 2.4 06/15/2020   Lab Results  Component Value Date   WBC 6.0 03/30/2021   HGB 14.1 03/30/2021   HCT 41.3 03/30/2021   MCV 99.5 03/30/2021   PLT 140 (L) 03/30/2021   Attestation Statements:   Reviewed by clinician on day of visit: allergies, medications, problem list, medical history, surgical history, family history, social history, and previous encounter notes.  I, Water quality scientist, CMA, am acting as Location manager for [employer], DO  I have reviewed the above documentation for accuracy and completeness, and I agree with the above. Jearld Lesch, DO

## 2021-04-14 ENCOUNTER — Encounter: Payer: Self-pay | Admitting: Surgery

## 2021-04-14 ENCOUNTER — Ambulatory Visit (INDEPENDENT_AMBULATORY_CARE_PROVIDER_SITE_OTHER): Payer: Medicare Other | Admitting: Surgery

## 2021-04-14 VITALS — BP 135/87 | HR 61 | Ht 70.0 in | Wt 200.0 lb

## 2021-04-14 DIAGNOSIS — Z9889 Other specified postprocedural states: Secondary | ICD-10-CM

## 2021-04-14 NOTE — Progress Notes (Signed)
Post-Op Visit Note   Patient: Christian [name]           Date of Birth: 11-13-1946           MRN: [id number] Visit Date: 04/14/2021 PCP: Alroy Dust, L.Marlou Sa, MD   Assessment & Plan:  Chief Complaint:  Chief Complaint  Patient presents with  . Lower Back - Routine Post Op  Stated for a white male who is 2 weeks status post left L3 hemilaminectomy and microdiscectomy returns.  States that he is doing very well.  Preop left leg pain is gone.  He is wanting to know when he can drive and use his riding lawn more.  States that he is already walking up to 2 miles. Visit Diagnoses:  1. History of lumbar laminectomy for spinal cord decompression     Plan: Patient doing well.  I advised him to avoid bending, twisting, heavy lifting.  No driving.  Also advised him that I do not want him using his riding lawn more.  Discussed the risk of rerupture and need for repeat surgery.  He voices understanding.  Follow-up in 4 weeks with Dr. Louanne Skye for recheck.  Advised patient also to gradually increase his walking distances and not to be too aggressive too quickly.  Follow-Up Instructions: Return in about 4 weeks (around 05/12/2021) for with dr Louanne Skye for 6 week postop appointment .   Orders:  No orders of the defined types were placed in this encounter.  No orders of the defined types were placed in this encounter.   Imaging: No results found.  PMFS History: Patient Active Problem List   Diagnosis Date Noted  . Herniation of lumbar intervertebral disc with radiculopathy 03/30/2021    Class: Acute  . Status post lumbar laminectomy 03/30/2021  . Restless legs 02/15/2021  . Hypertensive retinopathy 02/15/2021  . Constipation 02/15/2021  . Chronic diastolic heart failure ([place]) 01/26/2021  . Other forms of angina pectoris ([place]) 01/26/2021  . Low back pain 12/16/2020  . Primary osteoarthritis of left knee 02/05/2019  . History of left knee replacement 02/05/2019  . PVC's (premature ventricular  contractions)   . Dyspnea 12/06/2016  . Osteoarthritis of right hip 09/22/2015  . S/P total hip arthroplasty 09/22/2015  . Obesity 05/08/2014  . Osteoarthritis of knee 05/08/2014  . S/P total knee replacement using cement 05/06/2014  . Knee pain 04/08/2014  . Preoperative clearance 04/08/2014  . Coronary artery disease   . Diastolic dysfunction   . Hypertension   . Orthostatic hypotension   . Hypercholesterolemia    Past Medical History:  Diagnosis Date  . Arthritis    "joints; shoulders; left elbow; right thumb" (05/06/2014)  . Atrial tachycardia ([place])   . Breathing problem   . Cataracts, bilateral    immature  . Chest pain   . Constipation    takes Colace and Metamucil daily  . Coronary artery disease 02/2011   cath 11/2016 showing 20% RCA, 50% OM, 35% mid LCx, 70% D1, 65% mid LAD, 70% distal LAD 80% on medical management  . Diastolic dysfunction   . GERD (gastroesophageal reflux disease)   . Heart murmur   . Hemorrhoids   . History of bronchitis   . Hypercholesterolemia    takes Crestor daily  . Hypertension   . Joint pain   . Joint pain   . Nocturia   . Orthostatic hypotension   . Palpitations   . Premature atrial contractions   . PVC's (premature ventricular contractions)   .  Restless leg syndrome   . Rheumatoid arthritis ([place])   . Sciatica   . Sinus bradycardia     Family History  Problem Relation Age of Onset  . CVA Mother   . Stroke Mother   . CVA Father   . Heart disease Father   . High blood pressure Father   . High Cholesterol Father   . Stroke Father     Past Surgical History:  Procedure Laterality Date  . ACHILLES TENDON REPAIR Left    "& fixed a spur"  . CARDIAC CATHETERIZATION  2012  . CARDIAC CATHETERIZATION N/A 12/06/2016   Procedure: Left Heart Cath and Coronary Angiography;  Surgeon: Belva Crome, MD;  Location: [facility];  Service: Cardiovascular;  Laterality: N/A;  . COLONOSCOPY    . FLEXIBLE SIGMOIDOSCOPY  X 2  . HAND  SURGERY Left    "thumb; cleaned out arthritis"  . HEEL SPUR EXCISION Left   . KNEE ARTHROSCOPY Right   . LUMBAR LAMINECTOMY/DECOMPRESSION MICRODISCECTOMY N/A 03/30/2021   Procedure: LEFT L3 HEMILAMINECTOMY WITH EXCISION OF DISC HERNIATION;  Surgeon: Jessy Oto, MD;  Location: [facility];  Service: Orthopedics;  Laterality: N/A;  . SHOULDER SURGERY Right X 2   "cleaned out spurs"  . TOE FUSION Right    great toe; plates and screws  . TONSILLECTOMY  ~ 1950  . TOTAL HIP ARTHROPLASTY Right 09/22/2015   Procedure: TOTAL HIP ARTHROPLASTY;  Surgeon: Garald Balding, MD;  Location: [facility];  Service: Orthopedics;  Laterality: Right;  . TOTAL KNEE ARTHROPLASTY Right 05/06/2014  . TOTAL KNEE ARTHROPLASTY Right 05/06/2014   Procedure: RIGHT TOTAL KNEE ARTHROPLASTY;  Surgeon: Garald Balding, MD;  Location: [facility];  Service: Orthopedics;  Laterality: Right;  . TOTAL KNEE ARTHROPLASTY Left 02/05/2019   Procedure: LEFT TOTAL KNEE ARTHROPLASTY;  Surgeon: Garald Balding, MD;  Location: [facility];  Service: Orthopedics;  Laterality: Left;   Social History   Occupational History  . Occupation: Retired  Tobacco Use  . Smoking status: Former Smoker    Packs/day: 2.00    Years: 10.00    Pack years: 20.00    Types: Cigarettes    Quit date: 1981    Years since quitting: 41.3  . Smokeless tobacco: Never Used  . Tobacco comment: quit smoking in 1981  Vaping Use  . Vaping Use: Never used  Substance and Sexual Activity  . Alcohol use: Yes    Comment: "drink a beer sometimes once/month"  . Drug use: No  . Sexual activity: Never   Exam Very pleasant white male alert and oriented in no acute stress.  Today staples removed and Steri-Strips applied.  Incision healing well without signs of infection.  He is neurologically intact.

## 2021-04-15 NOTE — Discharge Summary (Signed)
Patient ID: Christian [name] MRN: [id number] DOB/AGE: 74/11/1946 74 y.o.  Admit date: 03/30/2021 Discharge date: 03/31/2021 Admission Diagnoses:  Principal Problem:   Herniation of lumbar intervertebral disc with radiculopathy Active Problems:   Status post lumbar laminectomy   Discharge Diagnoses:  Principal Problem:   Herniation of lumbar intervertebral disc with radiculopathy Active Problems:   Status post lumbar laminectomy  status post Procedure(s): LEFT L3 HEMILAMINECTOMY WITH EXCISION OF DISC HERNIATION  Past Medical History:  Diagnosis Date  . Arthritis    "joints; shoulders; left elbow; right thumb" (05/06/2014)  . Atrial tachycardia ([place])   . Breathing problem   . Cataracts, bilateral    immature  . Chest pain   . Constipation    takes Colace and Metamucil daily  . Coronary artery disease 02/2011   cath 11/2016 showing 20% RCA, 50% OM, 35% mid LCx, 70% D1, 65% mid LAD, 70% distal LAD 80% on medical management  . Diastolic dysfunction   . GERD (gastroesophageal reflux disease)   . Heart murmur   . Hemorrhoids   . History of bronchitis   . Hypercholesterolemia    takes Crestor daily  . Hypertension   . Joint pain   . Joint pain   . Nocturia   . Orthostatic hypotension   . Palpitations   . Premature atrial contractions   . PVC's (premature ventricular contractions)   . Restless leg syndrome   . Rheumatoid arthritis ([place])   . Sciatica   . Sinus bradycardia     Surgeries: Procedure(s): LEFT L3 HEMILAMINECTOMY WITH EXCISION OF DISC HERNIATION on 03/30/2021   Consultants:   Discharged Condition: Improved  Hospital Course: Christian [name] is an 75 y.o. male who was admitted 03/30/2021 for operative treatment of Herniation of lumbar intervertebral disc with radiculopathy. Patient failed conservative treatments (please see the history and physical for the specifics) and had severe unremitting pain that affects sleep, daily activities and work/hobbies. After  pre-op clearance, the patient was taken to the operating room on 03/30/2021 and underwent  Procedure(s): LEFT L3 HEMILAMINECTOMY WITH EXCISION OF DISC HERNIATION.    Patient was given perioperative antibiotics:  Anti-infectives (From admission, onward)   Start     Dose/Rate Route Frequency Ordered Stop   03/30/21 2200  ceFAZolin (ANCEF) IVPB 2g/100 mL premix        2 g 200 mL/hr over 30 Minutes Intravenous Every 8 hours 03/30/21 1633 03/31/21 0525   03/30/21 1100  ceFAZolin (ANCEF) IVPB 2g/100 mL premix        2 g 200 mL/hr over 30 Minutes Intravenous On call to O.R. 03/30/21 1058 03/30/21 1346       Patient was given sequential compression devices and early ambulation to prevent DVT.   Patient benefited maximally from hospital stay and there were no complications. At the time of discharge, the patient was urinating/moving their bowels without difficulty, tolerating a regular diet, pain is controlled with oral pain medications and they have been cleared by PT/OT.   Recent vital signs: No data found.   Recent laboratory studies: No results for input(s): WBC, HGB, HCT, PLT, NA, K, CL, CO2, BUN, CREATININE, GLUCOSE, INR, CALCIUM in the last 72 hours.  Invalid input(s): PT, 2   Discharge Medications:   Allergies as of 03/31/2021      Reactions   Atorvastatin Calcium [atorvastatin] Other (See Comments)   Myalgia   Simvastatin Other (See Comments)   Myalgia   Pravastatin Other (See Comments)   Leg Cramps  Medication List    STOP taking these medications   traMADol 50 MG tablet Commonly known as: ULTRAM     TAKE these medications   acetaminophen 500 MG tablet Commonly known as: TYLENOL Take 1,000 mg by mouth every 8 (eight) hours as needed for moderate pain.   aspirin 81 MG tablet Take 81 mg by mouth daily.   cholecalciferol 25 MCG (1000 UNIT) tablet Commonly known as: VITAMIN D3 Take 1,000 Units by mouth daily.   docusate sodium 100 MG capsule Commonly known as:  COLACE Take 300 mg by mouth daily. What changed: Another medication with the same name was added. Make sure you understand how and when to take each.   docusate sodium 100 MG capsule Commonly known as: COLACE Take 1 capsule (100 mg total) by mouth 2 (two) times daily. What changed: You were already taking a medication with the same name, and this prescription was added. Make sure you understand how and when to take each.   furosemide 20 MG tablet Commonly known as: LASIX TAKE 1 TABLET BY MOUTH EVERY MON, WED, FRI What changed:   how much to take  how to take this  when to take this   isosorbide mononitrate 60 MG 24 hr tablet Commonly known as: IMDUR TAKE 1 TABLET BY MOUTH EVERY DAY   methocarbamol 500 MG tablet Commonly known as: ROBAXIN Take 1 tablet (500 mg total) by mouth every 6 (six) hours as needed for muscle spasms.   Pfizer-BioNTech COVID-19 Vacc 30 MCG/0.3ML injection Generic drug: COVID-19 mRNA vaccine (Pfizer) INJECT AS DIRECTED   rosuvastatin 20 MG tablet Commonly known as: CRESTOR TAKE 1 TABLET BY MOUTH EVERY DAY What changed: how much to take     ASK your doctor about these medications   HYDROcodone-acetaminophen 7.5-325 MG tablet Commonly known as: NORCO Take 1 tablet by mouth every 4 (four) hours as needed for up to 7 days for moderate pain. Ask about: Should I take this medication?       Diagnostic Studies: Epidural Steroid injection  Result Date: 03/24/2021 Magnus Sinning, MD     03/24/2021  1:56 PM Lumbosacral Transforaminal Epidural Steroid Injection - Sub-Pedicular Approach with Fluoroscopic Guidance Patient: Christian [name]     Date of Birth: [birth year] MRN: [id number] PCP: Alroy Dust, L.Marlou Sa, MD     Visit Date: 03/24/2021  Universal Protocol:   Date/Time: 03/24/2021 Consent Given By: the patient Position: PRONE Additional Comments: Vital signs were monitored before and after the procedure. Patient was prepped and draped in the usual sterile  fashion. The correct patient, procedure, and site was verified. Injection Procedure Details: Procedure diagnoses: Lumbar radiculopathy [M54.16]  Meds Administered: Meds ordered this encounter Medications . methylPREDNISolone acetate (DEPO-MEDROL) injection 80 mg Laterality: Left Location/Site: L3-L4 Needle:5.0 in., 22 ga.  Short bevel or Quincke spinal needle Needle Placement: Transforaminal Findings:   -Comments: Excellent flow of contrast along the nerve, nerve root and into the epidural space. Procedure Details: After squaring off the end-plates to get a true AP view, the C-arm was positioned so that an oblique view of the foramen as noted above was visualized. The target area is just inferior to the "nose of the scotty dog" or sub pedicular. The soft tissues overlying this structure were infiltrated with 2-3 ml. of 1% Lidocaine without Epinephrine. The spinal needle was inserted toward the target using a "trajectory" view along the fluoroscope beam.  Under AP and lateral visualization, the needle was advanced so it did not puncture dura and was  located close the 6 O'Clock position of the pedical in AP tracterory. Biplanar projections were used to confirm position. Aspiration was confirmed to be negative for CSF and/or blood. A 1-2 ml. volume of Isovue-250 was injected and flow of contrast was noted at each level. Radiographs were obtained for documentation purposes. After attaining the desired flow of contrast documented above, a 0.5 to 1.0 ml test dose of 0.25% Marcaine was injected into each respective transforaminal space.  The patient was observed for 90 seconds post injection.  After no sensory deficits were reported, and normal lower extremity motor function was noted,   the above injectate was administered so that equal amounts of the injectate were placed at each foramen (level) into the transforaminal epidural space. Additional Comments: The patient tolerated the procedure well Dressing: 2 x 2 sterile  gauze and Band-Aid  Post-procedure details: Patient was observed during the procedure. Post-procedure instructions were reviewed. Patient left the clinic in stable condition.   DG Lumbar Spine 2-3 Views  Result Date: 03/30/2021 CLINICAL DATA:  L3 hemilaminectomy EXAM: LUMBAR SPINE - 2-3 VIEW COMPARISON:  None. FINDINGS: 1st cross-table lateral view of the lumbar spine demonstrates posterior needles directed at the L2 and L3 vertebral bodies. Second image demonstrates posterior surgical instrument directed at the L3 vertebral body. IMPRESSION: Intraoperative localization as above. Electronically Signed   By: Rolm Baptise M.D.   On: 03/30/2021 17:51   XR C-ARM NO REPORT  Result Date: 03/24/2021 Please see Notes tab for imaging impression.   Discharge Instructions    Call MD / Call 911   Complete by: As directed    If you experience chest pain or shortness of breath, CALL 911 and be transported to the hospital emergency room.  If you develope a fever above 101 F, pus (white drainage) or increased drainage or redness at the wound, or calf pain, call your surgeon's office.   Constipation Prevention   Complete by: As directed    Drink plenty of fluids.  Prune juice may be helpful.  You may use a stool softener, such as Colace (over the counter) 100 mg twice a day.  Use MiraLax (over the counter) for constipation as needed.   Diet - low sodium heart healthy   Complete by: As directed    Discharge instructions   Complete by: As directed    No lifting greater than 10 lbs. Avoid bending, stooping and twisting. Walk in house for first week them may start to get out slowly increasing distance up to one mile by 3 weeks post op. Keep incision dry for 3 days, may use tegaderm or similar water impervious dressing.   Driving restrictions   Complete by: As directed    No driving for 6 weeks   Increase activity slowly as tolerated   Complete by: As directed    Lifting restrictions   Complete by: As  directed    No lifting for 12 weeks       Follow-up Information    Jessy Oto, MD In 2 weeks.   Specialty: Orthopedic Surgery Why: For wound re-check Contact information: [address] [place] [address] [phone]               Discharge Plan:  discharge to home  Disposition:     Signed: Benjiman Core  04/15/2021, 3:40 PM

## 2021-04-16 ENCOUNTER — Other Ambulatory Visit: Payer: Self-pay | Admitting: Specialist

## 2021-04-26 ENCOUNTER — Ambulatory Visit (INDEPENDENT_AMBULATORY_CARE_PROVIDER_SITE_OTHER): Payer: Medicare Other | Admitting: Bariatrics

## 2021-04-26 ENCOUNTER — Encounter (INDEPENDENT_AMBULATORY_CARE_PROVIDER_SITE_OTHER): Payer: Self-pay | Admitting: Bariatrics

## 2021-04-26 ENCOUNTER — Other Ambulatory Visit: Payer: Self-pay

## 2021-04-26 VITALS — BP 127/88 | HR 59 | Temp 97.6°F | Ht 70.0 in | Wt 199.0 lb

## 2021-04-26 DIAGNOSIS — I10 Essential (primary) hypertension: Secondary | ICD-10-CM

## 2021-04-26 DIAGNOSIS — Z6839 Body mass index (BMI) 39.0-39.9, adult: Secondary | ICD-10-CM | POA: Diagnosis not present

## 2021-04-26 DIAGNOSIS — E7849 Other hyperlipidemia: Secondary | ICD-10-CM

## 2021-04-27 ENCOUNTER — Encounter (INDEPENDENT_AMBULATORY_CARE_PROVIDER_SITE_OTHER): Payer: Self-pay | Admitting: Bariatrics

## 2021-04-27 ENCOUNTER — Other Ambulatory Visit: Payer: Self-pay | Admitting: Cardiology

## 2021-04-27 DIAGNOSIS — I10 Essential (primary) hypertension: Secondary | ICD-10-CM

## 2021-04-27 NOTE — Progress Notes (Signed)
Chief Complaint:   OBESITY Christian [name] is here to discuss his progress with his obesity treatment plan along with follow-up of his obesity related diagnoses. Christian [name] is on the Category 1 Plan + 200 and states he is following his eating plan approximately 95% of the time. Christian [name] states he is walking 30 minutes 7 times per week.  Today's visit was #: 14 Starting weight: 278 lbs Starting date: 06/24/2020 Today's weight: 199 lbs Today's date: 04/26/2021 Total lbs lost to date: 79 Total lbs lost since last in-office visit: 0  Interim History: Christian [name]'s weight remains the same. He had a left L3 Hemilaminectomy with excision of disc herniation on 03/30/2021.  Subjective:   1. Other hyperlipidemia Christian [name] is taking Crestor.  2. Essential hypertension Christian [name]'s BP is essentially controlled. His diastolic is slightly elevated today. He denies pain.   BP Readings from Last 3 Encounters:  04/26/21 127/88  04/14/21 135/87  03/31/21 98/68    Assessment/Plan:   1. Other hyperlipidemia Cardiovascular risk and specific lipid/LDL goals reviewed.  We discussed several lifestyle modifications today and Christian [name] will continue to work on diet, exercise and weight loss efforts. Orders and follow up as documented in patient record. Continue Crestor.  Counseling Intensive lifestyle modifications are the first line treatment for this issue. . Dietary changes: Increase soluble fiber. Decrease simple carbohydrates. . Exercise changes: Moderate to vigorous-intensity aerobic activity 150 minutes per week if tolerated. . Lipid-lowering medications: see documented in medical record.  2. Essential hypertension Christian [name] is working on healthy weight loss and exercise to improve blood pressure control. We will watch for signs of hypotension as he continues his lifestyle modifications. Continue current treatment plan.  3. Obesity, current BMI 28 Christian [name] is currently in the action stage of change. As such, his  goal is to continue with weight loss efforts. He has agreed to the Category 2 Plan + 100 calories.   Meal plan Mindful eating Go to category 2 and 300 calories for snack. Exercise goals: Walk a mile a day.  Behavioral modification strategies: increasing lean protein intake, decreasing simple carbohydrates, increasing vegetables, increasing water intake, decreasing eating out, no skipping meals, meal planning and cooking strategies, keeping healthy foods in the home and planning for success.  Christian [name] has agreed to follow-up with our clinic in 2-3 weeks. He was informed of the importance of frequent follow-up visits to maximize his success with intensive lifestyle modifications for his multiple health conditions.   Objective:   Blood pressure 127/88, pulse (!) 59, temperature 97.6 F (36.4 C), height 5\' 10"  (1.778 m), weight 199 lb (90.3 kg), SpO2 96 %. Body mass index is 28.55 kg/m.  General: Cooperative, alert, well developed, in no acute distress. HEENT: Conjunctivae and lids unremarkable. Cardiovascular: Regular rhythm.  Lungs: Normal work of breathing. Neurologic: No focal deficits.   Lab Results  Component Value Date   CREATININE 0.89 03/30/2021   BUN 18 03/30/2021   NA 137 03/30/2021   K 3.8 03/30/2021   CL 109 03/30/2021   CO2 25 03/30/2021   Lab Results  Component Value Date   ALT 19 03/30/2021   AST 15 03/30/2021   ALKPHOS 42 03/30/2021   BILITOT 0.5 03/30/2021   Lab Results  Component Value Date   HGBA1C 5.4 06/24/2020   Lab Results  Component Value Date   INSULIN 4.4 01/26/2021   INSULIN 6.8 10/05/2020   INSULIN 12.1 06/24/2020   Lab Results  Component Value Date   TSH 3.670 06/24/2020  Lab Results  Component Value Date   CHOL 111 10/05/2020   HDL 46 10/05/2020   LDLCALC 52 10/05/2020   TRIG 55 10/05/2020   CHOLHDL 2.4 06/15/2020   Lab Results  Component Value Date   WBC 6.0 03/30/2021   HGB 14.1 03/30/2021   HCT 41.3 03/30/2021   MCV 99.5  03/30/2021   PLT 140 (L) 03/30/2021   No results found for: IRON, TIBC, FERRITIN  Obesity Behavioral Intervention:   Approximately 15 minutes were spent on the discussion below.  ASK: We discussed the diagnosis of obesity with Christian [name] today and Christian [name] agreed to give [name] permission to discuss obesity behavioral modification therapy today.  ASSESS: Christian [name] has the diagnosis of obesity and his BMI today is 28.6. Christian [name] is in the action stage of change.   ADVISE: Christian [name] was educated on the multiple health risks of obesity as well as the benefit of weight loss to improve his health. He was advised of the need for long term treatment and the importance of lifestyle modifications to improve his current health and to decrease his risk of future health problems.  AGREE: Multiple dietary modification options and treatment options were discussed and Christian [name] agreed to follow the recommendations documented in the above note.  ARRANGE: Christian [name] was educated on the importance of frequent visits to treat obesity as outlined per CMS and USPSTF guidelines and agreed to schedule his next follow up appointment today.  Attestation Statements:   Reviewed by clinician on day of visit: allergies, medications, problem list, medical history, surgical history, family history, social history, and previous encounter notes.  Coral Ceo, am acting as Location manager for [employer], DO.  I have reviewed the above documentation for accuracy and completeness, and I agree with the above. Jearld Lesch, DO

## 2021-04-28 ENCOUNTER — Ambulatory Visit: Payer: Medicare Other | Attending: Internal Medicine

## 2021-04-28 DIAGNOSIS — Z23 Encounter for immunization: Secondary | ICD-10-CM

## 2021-04-28 NOTE — Progress Notes (Signed)
   Covid-19 Vaccination Clinic  Name:  Christian [name]    MRN: [id number] DOB: 1946/03/08  04/28/2021  [name] was observed post Covid-19 immunization for 15 minutes without incident. He was provided with Vaccine Information Sheet and instruction to access the V-Safe system.   [name] was instructed to call 911 with any severe reactions post vaccine: [name] Kitchen Difficulty breathing  . Swelling of face and throat  . A fast heartbeat  . A bad rash all over body  . Dizziness and weakness   Immunizations Administered    Name Date Dose VIS Date Route   PFIZER Comrnaty(Gray TOP) Covid-19 Vaccine 04/28/2021 12:28 PM 0.3 mL 12/03/2020 Intramuscular   Manufacturer: [employer], [employer]   Lot: [id number]   NDC: [phone]

## 2021-05-04 ENCOUNTER — Other Ambulatory Visit (HOSPITAL_BASED_OUTPATIENT_CLINIC_OR_DEPARTMENT_OTHER): Payer: Self-pay

## 2021-05-04 MED ORDER — PFIZER-BIONT COVID-19 VAC-TRIS 30 MCG/0.3ML IM SUSP
INTRAMUSCULAR | 0 refills | Status: DC
  Filled 2021-05-04: qty 0.3, 1d supply, fill #0

## 2021-05-05 ENCOUNTER — Telehealth: Payer: Self-pay | Admitting: Specialist

## 2021-05-05 NOTE — Telephone Encounter (Signed)
Pt has came down with COVID so he will not be able to make his appt on 05/12/2021. I rescheduled him for 06/10/2021 because that is the first available. He wants to know if Dr.Nitka could see him sooner? He wanted it to be rescheduled like a week out. Cb [phone]

## 2021-05-05 NOTE — Telephone Encounter (Signed)
Added to the cancellation list °

## 2021-05-11 ENCOUNTER — Ambulatory Visit (INDEPENDENT_AMBULATORY_CARE_PROVIDER_SITE_OTHER): Payer: Medicare Other | Admitting: Bariatrics

## 2021-05-12 ENCOUNTER — Ambulatory Visit: Payer: Medicare Other | Admitting: Specialist

## 2021-05-19 ENCOUNTER — Other Ambulatory Visit: Payer: Self-pay

## 2021-05-19 ENCOUNTER — Ambulatory Visit (INDEPENDENT_AMBULATORY_CARE_PROVIDER_SITE_OTHER): Payer: Medicare Other | Admitting: Bariatrics

## 2021-05-19 VITALS — BP 145/82 | HR 59 | Temp 97.9°F | Ht 70.0 in | Wt 202.0 lb

## 2021-05-19 DIAGNOSIS — Z6839 Body mass index (BMI) 39.0-39.9, adult: Secondary | ICD-10-CM

## 2021-05-19 DIAGNOSIS — E7849 Other hyperlipidemia: Secondary | ICD-10-CM

## 2021-05-19 DIAGNOSIS — I10 Essential (primary) hypertension: Secondary | ICD-10-CM | POA: Diagnosis not present

## 2021-05-20 ENCOUNTER — Encounter (INDEPENDENT_AMBULATORY_CARE_PROVIDER_SITE_OTHER): Payer: Self-pay | Admitting: Bariatrics

## 2021-05-25 ENCOUNTER — Encounter (INDEPENDENT_AMBULATORY_CARE_PROVIDER_SITE_OTHER): Payer: Self-pay | Admitting: Bariatrics

## 2021-05-25 NOTE — Progress Notes (Signed)
Chief Complaint:   OBESITY Christian [name] is here to discuss his progress with his obesity treatment plan along with follow-up of his obesity related diagnoses. Christian [name] is on the Category 2 Plan + 100 calories and states he is following his eating plan approximately 95% of the time. Christian [name] states he is walking for 55 minutes 6 times per week.  Today's visit was #: 15 Starting weight: 278 lbs Starting date: 06/24/2020 Today's weight: 202 lbs Today's date: 05/19/2021 Total lbs lost to date: 76 Total lbs lost since last in-office visit: 0  Interim History: Christian [name] is up 3 lbs from his last visit., but according to our bioimpedance scale he is up almost 5 lbs in water weight.  Subjective:   1. Essential hypertension Christian [name] takes Imdur and Lasix. He notes increased fluid retention.  2. Other hyperlipidemia Christian [name] is taking Crestor.   Assessment/Plan:   1. Essential hypertension Christian [name] will continue his medications, and will continue working on healthy weight loss and exercise, and no added salt to improve blood pressure control. We will watch for signs of hypotension as he continues his lifestyle modifications.  2. Other hyperlipidemia Cardiovascular risk and specific lipid/LDL goals reviewed. We discussed several lifestyle modifications today. Christian [name] will continue his medications, and will continue to work on diet, exercise and weight loss efforts. Orders and follow up as documented in patient record.   Counseling Intensive lifestyle modifications are the first line treatment for this issue. . Dietary changes: Increase soluble fiber. Decrease simple carbohydrates. . Exercise changes: Moderate to vigorous-intensity aerobic activity 150 minutes per week if tolerated. . Lipid-lowering medications: see documented in medical record.  3. Obesity, current BMI 29 Christian [name] is currently in the action stage of change. As such, his goal is to continue with weight loss efforts. He has agreed  to the Category 1 Plan + 200 calories.   Exercise goals: As is.  Behavioral modification strategies: increasing lean protein intake, decreasing simple carbohydrates, increasing vegetables, increasing water intake, decreasing eating out, no skipping meals, meal planning and cooking strategies, keeping healthy foods in the home and planning for success.  Christian [name] has agreed to follow-up with our clinic in 3 to 4 weeks. He was informed of the importance of frequent follow-up visits to maximize his success with intensive lifestyle modifications for his multiple health conditions.   Objective:   Blood pressure (!) 145/82, pulse (!) 59, temperature 97.9 F (36.6 C), height 5\' 10"  (1.778 m), weight 202 lb (91.6 kg), SpO2 96 %. Body mass index is 28.98 kg/m.  General: Cooperative, alert, well developed, in no acute distress. HEENT: Conjunctivae and lids unremarkable. Cardiovascular: Regular rhythm.  Lungs: Normal work of breathing. Neurologic: No focal deficits.   Lab Results  Component Value Date   CREATININE 0.89 03/30/2021   BUN 18 03/30/2021   NA 137 03/30/2021   K 3.8 03/30/2021   CL 109 03/30/2021   CO2 25 03/30/2021   Lab Results  Component Value Date   ALT 19 03/30/2021   AST 15 03/30/2021   ALKPHOS 42 03/30/2021   BILITOT 0.5 03/30/2021   Lab Results  Component Value Date   HGBA1C 5.4 06/24/2020   Lab Results  Component Value Date   INSULIN 4.4 01/26/2021   INSULIN 6.8 10/05/2020   INSULIN 12.1 06/24/2020   Lab Results  Component Value Date   TSH 3.670 06/24/2020   Lab Results  Component Value Date   CHOL 111 10/05/2020   HDL 46 10/05/2020   Christian [name]  52 10/05/2020   TRIG 55 10/05/2020   CHOLHDL 2.4 06/15/2020   Lab Results  Component Value Date   WBC 6.0 03/30/2021   HGB 14.1 03/30/2021   HCT 41.3 03/30/2021   MCV 99.5 03/30/2021   PLT 140 (L) 03/30/2021   No results found for: IRON, TIBC, FERRITIN  Obesity Behavioral Intervention:   Approximately  15 minutes were spent on the discussion below.  ASK: We discussed the diagnosis of obesity with Christian [name] today and Christian [name] agreed to give [name] permission to discuss obesity behavioral modification therapy today.  ASSESS: Christian [name] has the diagnosis of obesity and his BMI today is 28.98. Christian [name] is in the action stage of change.   ADVISE: Christian [name] was educated on the multiple health risks of obesity as well as the benefit of weight loss to improve his health. He was advised of the need for long term treatment and the importance of lifestyle modifications to improve his current health and to decrease his risk of future health problems.  AGREE: Multiple dietary modification options and treatment options were discussed and Mena agreed to follow the recommendations documented in the above note.  ARRANGE: Christian [name] was educated on the importance of frequent visits to treat obesity as outlined per CMS and USPSTF guidelines and agreed to schedule his next follow up appointment today.  Attestation Statements:   Reviewed by clinician on day of visit: allergies, medications, problem list, medical history, surgical history, family history, social history, and previous encounter notes.   Wilhemena Durie, am acting as Location manager for [employer], DO.  I have reviewed the above documentation for accuracy and completeness, and I agree with the above. Jearld Lesch, DO

## 2021-05-29 ENCOUNTER — Other Ambulatory Visit: Payer: Self-pay | Admitting: Cardiology

## 2021-05-29 DIAGNOSIS — E7800 Pure hypercholesterolemia, unspecified: Secondary | ICD-10-CM

## 2021-06-10 ENCOUNTER — Other Ambulatory Visit: Payer: Self-pay

## 2021-06-10 ENCOUNTER — Encounter: Payer: Self-pay | Admitting: Specialist

## 2021-06-10 ENCOUNTER — Ambulatory Visit (INDEPENDENT_AMBULATORY_CARE_PROVIDER_SITE_OTHER): Payer: Medicare Other | Admitting: Specialist

## 2021-06-10 VITALS — BP 122/74 | HR 66 | Ht 70.0 in | Wt 200.0 lb

## 2021-06-10 DIAGNOSIS — M5116 Intervertebral disc disorders with radiculopathy, lumbar region: Secondary | ICD-10-CM

## 2021-06-10 DIAGNOSIS — Z9889 Other specified postprocedural states: Secondary | ICD-10-CM

## 2021-06-10 NOTE — Progress Notes (Signed)
Post-Op Visit Note   Patient: Christian [name]           Date of Birth: [birth year]           MRN: [id number] Visit Date: 06/10/2021 PCP: Alroy Dust, L.Marlou Sa, MD   Assessment & Plan: 10.5 weeks post op left L3 hemilaminectomy for HNP  Chief Complaint:  Chief Complaint  Patient presents with   Lower Back - Routine Post Op    Had left L3 Hemilaminectomy with excision of Disc Herniation on 03/30/21  No complaints, walking 2 mi every AM, going to pool, No B or B difficulty. Incision is healed. SLR negative. Motor is normal . Visit Diagnoses: No diagnosis found.  Plan: Avoid frequent bending and stooping  No lifting greater than 10 lbs. May use ice or moist heat for pain. Weight loss is of benefit. Best medication for lumbar disc disease is arthritis medications like motrin, celebrex and naprosyn. Exercise is important to improve your indurance and does allow people to function better inspite of back pain.    Follow-Up Instructions: No follow-ups on file.   Orders:  No orders of the defined types were placed in this encounter.  No orders of the defined types were placed in this encounter.   Imaging: No results found.  PMFS History: Patient Active Problem List   Diagnosis Date Noted   Herniation of lumbar intervertebral disc with radiculopathy 03/30/2021    Priority: High    Class: Acute   Status post lumbar laminectomy 03/30/2021   Restless legs 02/15/2021   Hypertensive retinopathy 02/15/2021   Constipation 02/15/2021   Chronic diastolic heart failure ([place]) 01/26/2021   Other forms of angina pectoris ([place]) 01/26/2021   Low back pain 12/16/2020   Primary osteoarthritis of left knee 02/05/2019   History of left knee replacement 02/05/2019   PVC's (premature ventricular contractions)    Dyspnea 12/06/2016   Osteoarthritis of right hip 09/22/2015   S/P total hip arthroplasty 09/22/2015   Obesity 05/08/2014   Osteoarthritis of knee 05/08/2014   S/P total knee  replacement using cement 05/06/2014   Knee pain 04/08/2014   Preoperative clearance 04/08/2014   Coronary artery disease    Diastolic dysfunction    Hypertension    Orthostatic hypotension    Hypercholesterolemia    Past Medical History:  Diagnosis Date   Arthritis    "joints; shoulders; left elbow; right thumb" (05/06/2014)   Atrial tachycardia (HCC)    Breathing problem    Cataracts, bilateral    immature   Chest pain    Constipation    takes Colace and Metamucil daily   Coronary artery disease 02/2011   cath 11/2016 showing 20% RCA, 50% OM, 35% mid LCx, 70% D1, 65% mid LAD, 70% distal LAD 80% on medical management   Diastolic dysfunction    GERD (gastroesophageal reflux disease)    Heart murmur    Hemorrhoids    History of bronchitis    Hypercholesterolemia    takes Crestor daily   Hypertension    Joint pain    Joint pain    Nocturia    Orthostatic hypotension    Palpitations    Premature atrial contractions    PVC's (premature ventricular contractions)    Restless leg syndrome    Rheumatoid arthritis (HCC)    Sciatica    Sinus bradycardia     Family History  Problem Relation Age of Onset   CVA Mother    Stroke Mother    CVA Father  Heart disease Father    High blood pressure Father    High Cholesterol Father    Stroke Father     Past Surgical History:  Procedure Laterality Date   ACHILLES TENDON REPAIR Left    "& fixed a spur"   CARDIAC CATHETERIZATION  2012   CARDIAC CATHETERIZATION N/A 12/06/2016   Procedure: Left Heart Cath and Coronary Angiography;  Surgeon: Belva Crome, MD;  Location: [facility];  Service: Cardiovascular;  Laterality: N/A;   COLONOSCOPY     FLEXIBLE SIGMOIDOSCOPY  X 2   HAND SURGERY Left    "thumb; cleaned out arthritis"   HEEL SPUR EXCISION Left    KNEE ARTHROSCOPY Right    LUMBAR LAMINECTOMY/DECOMPRESSION MICRODISCECTOMY N/A 03/30/2021   Procedure: LEFT L3 HEMILAMINECTOMY WITH EXCISION OF DISC HERNIATION;  Surgeon:  Jessy Oto, MD;  Location: [facility];  Service: Orthopedics;  Laterality: N/A;   SHOULDER SURGERY Right X 2   "cleaned out spurs"   TOE FUSION Right    great toe; plates and screws   TONSILLECTOMY  ~ [place] Right 09/22/2015   Procedure: TOTAL HIP ARTHROPLASTY;  Surgeon: Garald Balding, MD;  Location: [facility];  Service: Orthopedics;  Laterality: Right;   TOTAL KNEE ARTHROPLASTY Right 05/06/2014   TOTAL KNEE ARTHROPLASTY Right 05/06/2014   Procedure: RIGHT TOTAL KNEE ARTHROPLASTY;  Surgeon: Garald Balding, MD;  Location: [facility];  Service: Orthopedics;  Laterality: Right;   TOTAL KNEE ARTHROPLASTY Left 02/05/2019   Procedure: LEFT TOTAL KNEE ARTHROPLASTY;  Surgeon: Garald Balding, MD;  Location: [facility];  Service: Orthopedics;  Laterality: Left;   Social History   Occupational History   Occupation: Retired  Tobacco Use   Smoking status: Former    Packs/day: 2.00    Years: 10.00    Pack years: 20.00    Types: Cigarettes    Quit date: 1981    Years since quitting: 41.4   Smokeless tobacco: Never   Tobacco comments:    quit smoking in 1981  Vaping Use   Vaping Use: Never used  Substance and Sexual Activity   Alcohol use: Yes    Comment: "drink a beer sometimes once/month"   Drug use: No   Sexual activity: Never

## 2021-06-14 ENCOUNTER — Ambulatory Visit (INDEPENDENT_AMBULATORY_CARE_PROVIDER_SITE_OTHER): Payer: Medicare Other | Admitting: Bariatrics

## 2021-06-20 IMAGING — MR MR LUMBAR SPINE W/O CM
4 of 5 series · 27 of 48 positions shown · non-contrast
Comparison: Lumbar spine radiographs 12/16/2020

CLINICAL DATA: Lumbar radiculopathy. Low back pain radiating to the
left hip and leg for 2 months.

EXAM:
MRI LUMBAR SPINE WITHOUT CONTRAST
TECHNIQUE: Multiplanar, multisequence MR imaging of the lumbar spine was
performed. No intravenous contrast was administered.

[Series 3: T2 · sagittal · 4.0mm · 1.09mm/px · 6 of 17 slices shown (1 of 2)]
[im 1/17]
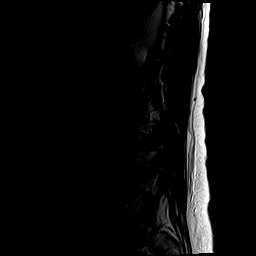
[im 4/17]
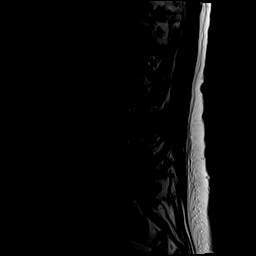
[im 7/17]
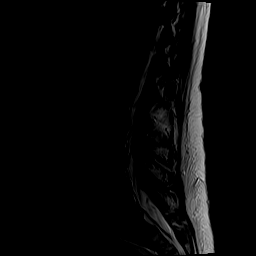
[im 10/17]
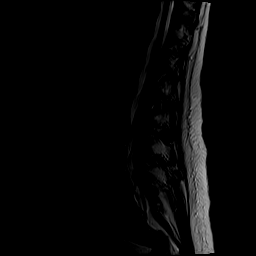
[im 13/17]
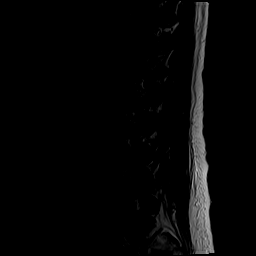
[im 17/17]
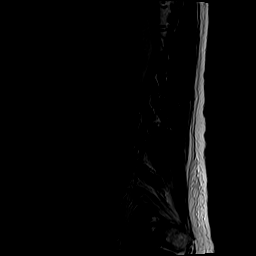

[Series 5: T1 · sagittal · 4.0mm · 1.09mm/px · 6 of 17 slices shown (1 of 2)]
[im 1/17]
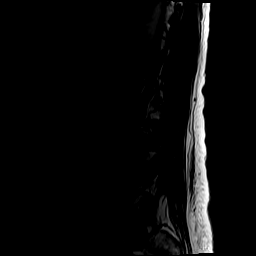
[im 4/17]
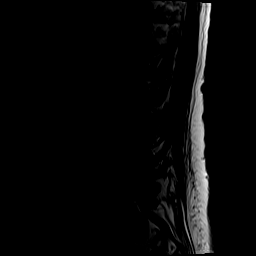
[im 7/17]
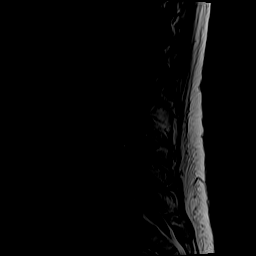
[im 10/17]
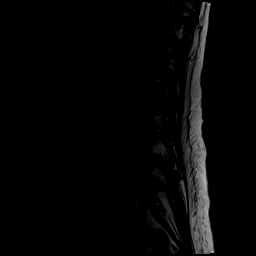
[im 13/17]
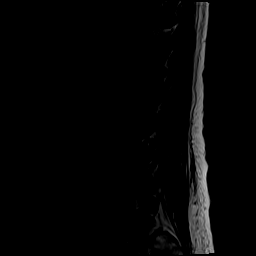
[im 17/17]
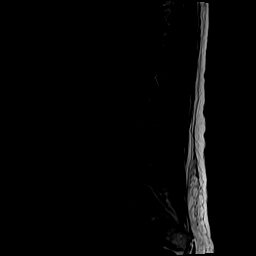

[Series 6: T2 · axial · 4.0mm · 0.39mm/px · z∈[-92,+130]mm · 9 of 45 slices shown (2 of 2)]
[im 1/45]
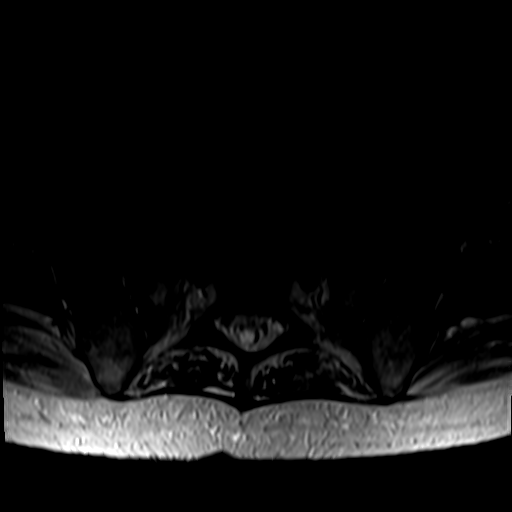
[im 7/45]
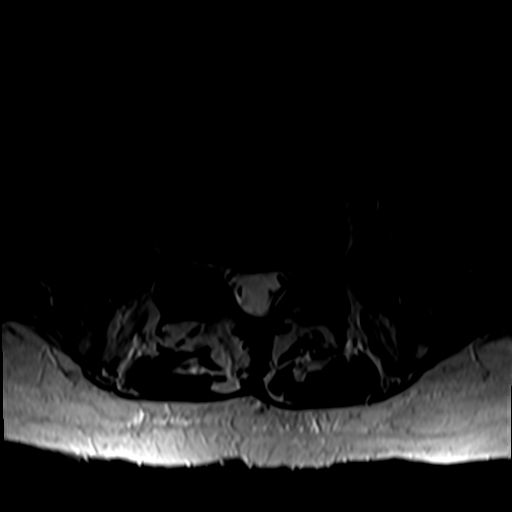
[im 13/45]
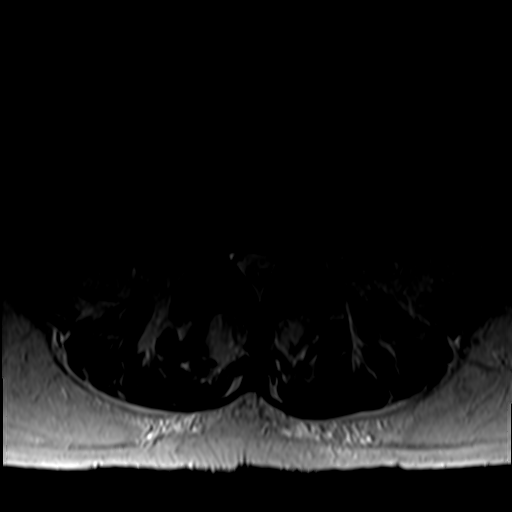
[im 19/45]
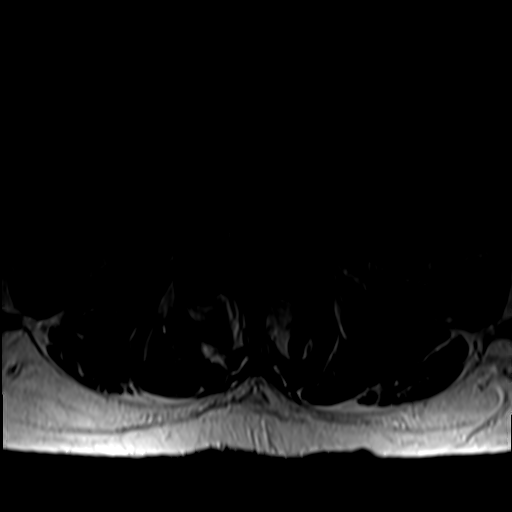
[im 23/45]
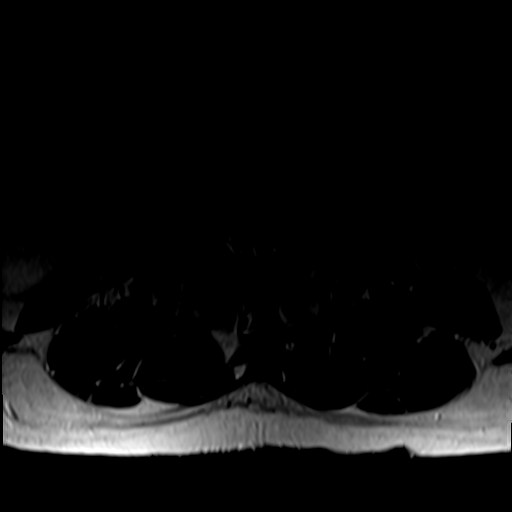
[im 26/45]
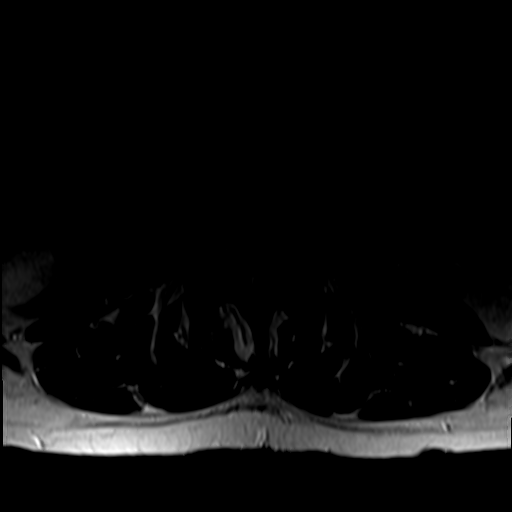
[im 32/45]
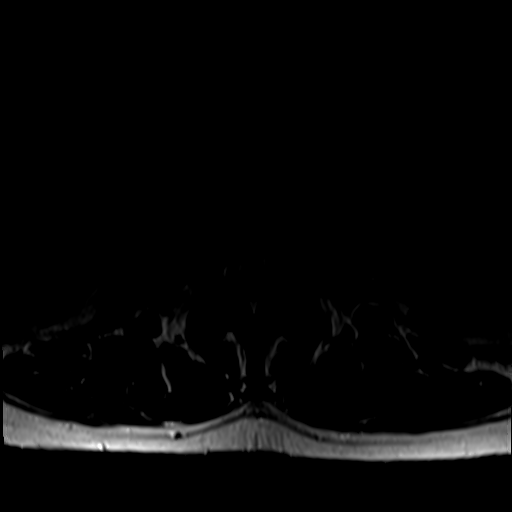
[im 38/45]
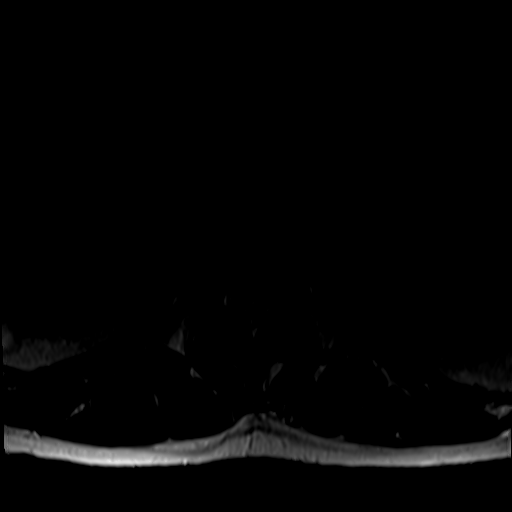
[im 45/45]
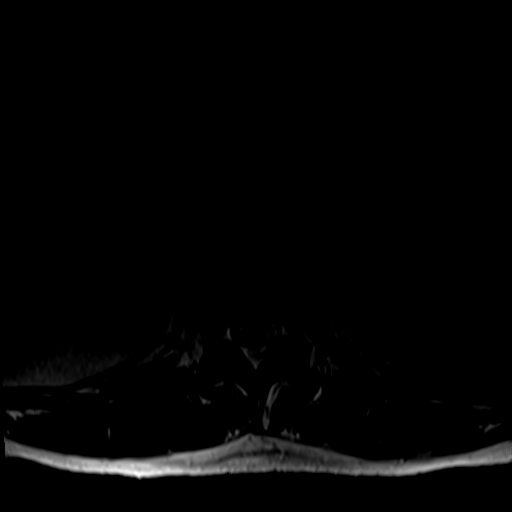

[Series 7: T1 · axial · 4.0mm · 0.39mm/px · z∈[-92,+96]mm · 6 of 45 slices shown (2 of 2)]
[im 1/45]
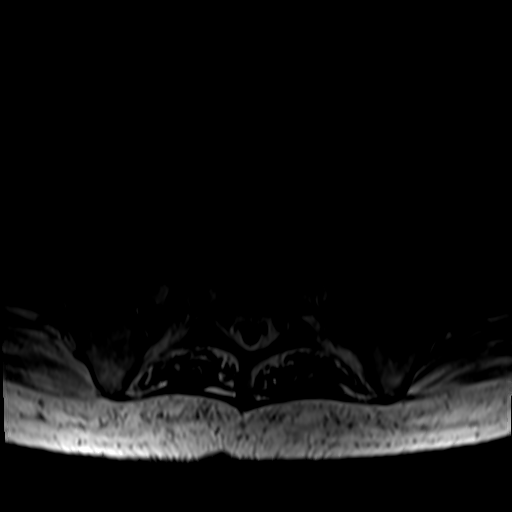
[im 7/45]
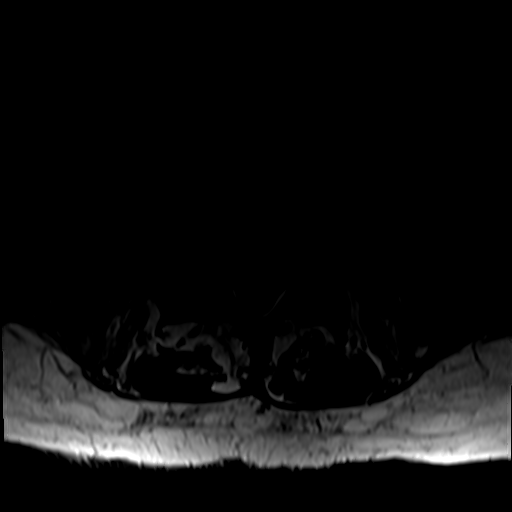
[im 13/45]
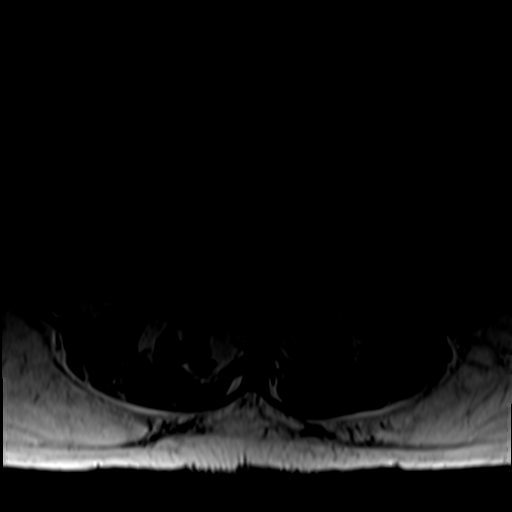
[im 19/45]
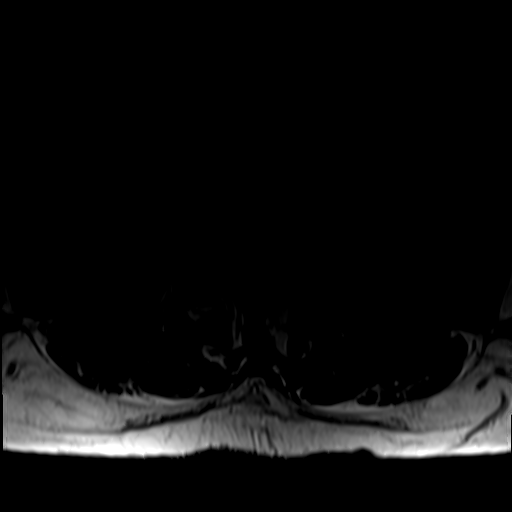
[im 23/45]
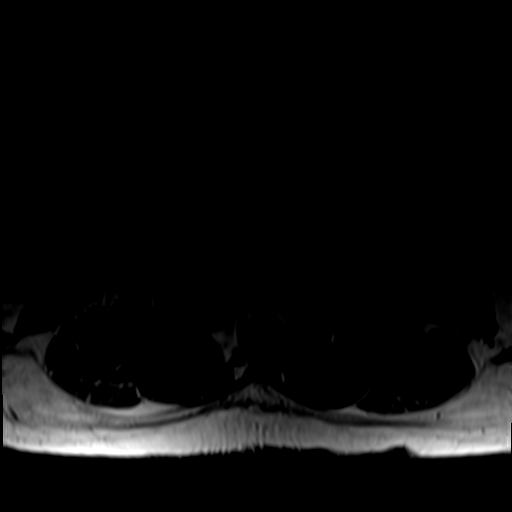
[im 38/45]
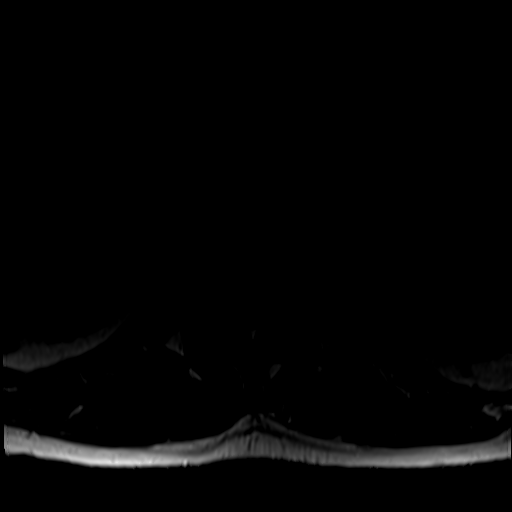

[27 of 48 positions shown; findings below may reference images not displayed]

FINDINGS: Segmentation:  Standard.

Alignment:  Normal.

Vertebrae: Mild edema in the region of the posterior pedicle/pars
interarticularis on the right at L5, potentially reactive as a
discrete fracture is not identified. Preserved vertebral body
heights. No suspicious marrow lesion.

Conus medullaris and cauda equina: Conus extends to the T12-L1
level. Conus and cauda equina appear normal.

Paraspinal and other soft tissues: Partially visualized renal cysts
including a 5.5 cm cyst on the right and numerous smaller cysts
bilaterally.

Disc levels:

Disc desiccation throughout the lumbar spine. Moderate disc space
narrowing at L5-S1.

T12-L1: Mild facet and ligamentum flavum hypertrophy without disc
herniation or stenosis.

L1-2: Minimal disc bulging and mild facet and ligamentum flavum
hypertrophy without stenosis.

L2-3: Circumferential disc bulging, a small left paracentral and
subarticular disc extrusion with migration to the inferior L3
vertebral body level, and moderate facet and ligamentum flavum
hypertrophy result in mild spinal stenosis and mild right and
moderate left lateral recess stenosis with the disc extrusion likely
resulting in left L3 nerve root impingement. Patent neural foramina.

L3-4: Mild disc bulging, small right central disc protrusion, and
moderate facet and ligamentum flavum hypertrophy without significant
stenosis.

L4-5: Minimal disc bulging, a small left foraminal disc protrusion,
and mild-to-moderate facet and ligamentum flavum hypertrophy result
in mild left neural foraminal stenosis without spinal stenosis.

L5-S1: Mild disc bulging and a shallow central disc protrusion with
annular fissure without stenosis.
IMPRESSION: 1. Disc extrusion at L2-3 resulting in left lateral recess stenosis
and left L3 nerve root impingement.
2. Mild left neural foraminal stenosis at L4-5.

## 2021-07-01 ENCOUNTER — Other Ambulatory Visit: Payer: Self-pay

## 2021-07-01 ENCOUNTER — Ambulatory Visit (INDEPENDENT_AMBULATORY_CARE_PROVIDER_SITE_OTHER): Payer: Medicare Other | Admitting: Bariatrics

## 2021-07-01 ENCOUNTER — Encounter (INDEPENDENT_AMBULATORY_CARE_PROVIDER_SITE_OTHER): Payer: Self-pay | Admitting: Bariatrics

## 2021-07-01 VITALS — BP 128/82 | HR 62 | Temp 97.9°F | Ht 70.0 in | Wt 198.0 lb

## 2021-07-01 DIAGNOSIS — I10 Essential (primary) hypertension: Secondary | ICD-10-CM | POA: Diagnosis not present

## 2021-07-01 DIAGNOSIS — Z6839 Body mass index (BMI) 39.0-39.9, adult: Secondary | ICD-10-CM

## 2021-07-01 DIAGNOSIS — E7849 Other hyperlipidemia: Secondary | ICD-10-CM | POA: Diagnosis not present

## 2021-07-02 ENCOUNTER — Encounter: Payer: Self-pay | Admitting: Cardiology

## 2021-07-02 ENCOUNTER — Ambulatory Visit (INDEPENDENT_AMBULATORY_CARE_PROVIDER_SITE_OTHER): Payer: Medicare Other | Admitting: Cardiology

## 2021-07-02 VITALS — BP 106/80 | HR 63 | Ht 71.0 in | Wt 202.8 lb

## 2021-07-02 DIAGNOSIS — I2510 Atherosclerotic heart disease of native coronary artery without angina pectoris: Secondary | ICD-10-CM

## 2021-07-02 DIAGNOSIS — R001 Bradycardia, unspecified: Secondary | ICD-10-CM | POA: Diagnosis not present

## 2021-07-02 DIAGNOSIS — E7800 Pure hypercholesterolemia, unspecified: Secondary | ICD-10-CM

## 2021-07-02 DIAGNOSIS — I951 Orthostatic hypotension: Secondary | ICD-10-CM | POA: Diagnosis not present

## 2021-07-02 DIAGNOSIS — I493 Ventricular premature depolarization: Secondary | ICD-10-CM | POA: Diagnosis not present

## 2021-07-02 DIAGNOSIS — I10 Essential (primary) hypertension: Secondary | ICD-10-CM

## 2021-07-02 MED ORDER — ISOSORBIDE MONONITRATE ER 60 MG PO TB24: 60.0000 mg | ORAL_TABLET | Freq: Every day | ORAL | 3 refills | Status: DC

## 2021-07-02 MED ORDER — ROSUVASTATIN CALCIUM 20 MG PO TABS: 20.0000 mg | ORAL_TABLET | Freq: Every day | ORAL | 3 refills | Status: DC

## 2021-07-02 NOTE — Patient Instructions (Signed)

## 2021-07-02 NOTE — Progress Notes (Signed)
Date:  07/02/2021   ID:  Christian [name], DOB Mar 14, 1946, MRN [id number]  PCP:  Aurea Graff.Marlou Sa, MD  Cardiologist:  Fransico Him, MD  Electrophysiologist:  None   Evaluation Performed:  Follow-Up Visit  Chief Complaint:  CAD, HTN, orthostatic hypotension, HLD  History of Present Illness:    Christian [name] is a 75 y.o. male with CAD by cath 11/2016, HTN also with orthostatic hypertension, hyperlipidemia, arthritis, cataracts, HLD, PVCs, sciatica who presents for follow-up of blood pressure.   To recap prior history, he had a prior heart cath in 2017 showing mild-moderate LAD, circumflex and RCA disease without high grade obstruction. LVEDP was elevated, EF was 50%. Medical therapy was recommended. Nuclear stress test in 2019 for atypical CP was normal.  PVCs were noted during the study and f/u Holter was done 12/2016 showing NSR with frequent PACs, nonsustained atrial tachycardia up to 6-9 beats, and frequent PVCs (PVC load was 2.8%). He had issues with recurrent CP in 09/2018. Stress test was normal. Echo showed EF 60-65%, grade 1 DD, mild AI, mild LAE/RAE.   Repeat heart monitor showed sinus bradycardia, NSR to sinus tach, average HR 64 bpm, range 46->125bpm, frequent PVCs, load 5.5%, occasional PACs and nonsustained atrial tachycardia. The 46bpm was only during sleeping hours.   He is here today for followup and is doing well. He has lost over 80lbs intentionally and looks great!  He denies any chest pain or pressure, SOB, DOE, PND, orthopnea, LE edema, dizziness (unless he stands up too fast), palpitations or syncope. He is compliant with his meds and is tolerating meds with no SE.     Past Medical History:  Diagnosis Date   Arthritis    "joints; shoulders; left elbow; right thumb" (05/06/2014)   Atrial tachycardia (HCC)    Breathing problem    Cataracts, bilateral    immature   Chest pain    Constipation    takes Colace and Metamucil daily   Coronary artery disease 02/2011    cath 11/2016 showing 20% RCA, 50% OM, 35% mid LCx, 70% D1, 65% mid LAD, 70% distal LAD 80% on medical management   Diastolic dysfunction    GERD (gastroesophageal reflux disease)    Heart murmur    Hemorrhoids    History of bronchitis    Hypercholesterolemia    takes Crestor daily   Hypertension    Joint pain    Joint pain    Nocturia    Orthostatic hypotension    Palpitations    Premature atrial contractions    PVC's (premature ventricular contractions)    Restless leg syndrome    Rheumatoid arthritis ([place])    Sciatica    Sinus bradycardia    Past Surgical History:  Procedure Laterality Date   ACHILLES TENDON REPAIR Left    "& fixed a spur"   CARDIAC CATHETERIZATION  2012   CARDIAC CATHETERIZATION N/A 12/06/2016   Procedure: Left Heart Cath and Coronary Angiography;  Surgeon: Belva Crome, MD;  Location: [facility];  Service: Cardiovascular;  Laterality: N/A;   COLONOSCOPY     FLEXIBLE SIGMOIDOSCOPY  X 2   HAND SURGERY Left    "thumb; cleaned out arthritis"   HEEL SPUR EXCISION Left    KNEE ARTHROSCOPY Right    LUMBAR LAMINECTOMY/DECOMPRESSION MICRODISCECTOMY N/A 03/30/2021   Procedure: LEFT L3 HEMILAMINECTOMY WITH EXCISION OF DISC HERNIATION;  Surgeon: Jessy Oto, MD;  Location: [facility];  Service: Orthopedics;  Laterality: N/A;   SHOULDER  SURGERY Right X 2   "cleaned out spurs"   TOE FUSION Right    great toe; plates and screws   TONSILLECTOMY  ~ [place] Right 09/22/2015   Procedure: TOTAL HIP ARTHROPLASTY;  Surgeon: Garald Balding, MD;  Location: [facility];  Service: Orthopedics;  Laterality: Right;   TOTAL KNEE ARTHROPLASTY Right 05/06/2014   TOTAL KNEE ARTHROPLASTY Right 05/06/2014   Procedure: RIGHT TOTAL KNEE ARTHROPLASTY;  Surgeon: Garald Balding, MD;  Location: [facility];  Service: Orthopedics;  Laterality: Right;   TOTAL KNEE ARTHROPLASTY Left 02/05/2019   Procedure: LEFT TOTAL KNEE ARTHROPLASTY;  Surgeon: Garald Balding, MD;   Location: [facility];  Service: Orthopedics;  Laterality: Left;     Current Meds  Medication Sig   acetaminophen (TYLENOL) 500 MG tablet Take 1,000 mg by mouth every 8 (eight) hours as needed for moderate pain.   aspirin 81 MG tablet Take 81 mg by mouth daily.   cholecalciferol (VITAMIN D3) 25 MCG (1000 UNIT) tablet Take 1,000 Units by mouth daily.   docusate sodium (COLACE) 100 MG capsule Take 100 mg by mouth 3 (three) times daily.   furosemide (LASIX) 20 MG tablet TAKE 1 TABLET BY MOUTH EVERY MON, WED, FRI   isosorbide mononitrate (IMDUR) 60 MG 24 hr tablet TAKE 1 TABLET BY MOUTH EVERY DAY   rosuvastatin (CRESTOR) 20 MG tablet TAKE 1 TABLET BY MOUTH EVERY DAY     Allergies:   Atorvastatin calcium [atorvastatin], Simvastatin, and Pravastatin   Social History   Tobacco Use   Smoking status: Former    Packs/day: 2.00    Years: 10.00    Pack years: 20.00    Types: Cigarettes    Quit date: 1981    Years since quitting: 41.5   Smokeless tobacco: Never   Tobacco comments:    quit smoking in 1981  Vaping Use   Vaping Use: Never used  Substance Use Topics   Alcohol use: Yes    Comment: "drink a beer sometimes once/month"   Drug use: No     Family Hx: The patient's family history includes CVA in his father and mother; Heart disease in his father; High Cholesterol in his father; High blood pressure in his father; Stroke in his father and mother.  ROS:   Please see the history of present illness.    All other systems reviewed and are negative.   Prior CV studies:    Most recent pertinent cardiac studies are outlined and summarized above. Reports included below if pertinent.  2D echo 09/2018 - Left ventricle: The cavity size was normal. Wall thickness was    normal. Systolic function was normal. The estimated ejection    fraction was in the range of 60% to 65%. Wall motion was normal;    there were no regional wall motion abnormalities. Doppler    parameters are consistent with  abnormal left ventricular    relaxation (grade 1 diastolic dysfunction).  - Aortic valve: Mildly to moderately calcified annulus. Mildly    thickened leaflets. There was mild regurgitation.  - Left atrium: The atrium was mildly dilated.  - Right atrium: The atrium was mildly dilated.     Labs/Other Tests and Data Reviewed:    EKG:  NSR with no ST changes  Recent Labs: 03/30/2021: ALT 19; BUN 18; Creatinine, Ser 0.89; Hemoglobin 14.1; Platelets 140; Potassium 3.8; Sodium 137   Recent Lipid Panel Lab Results  Component Value Date/Time   CHOL 111  10/05/2020 09:21 AM   TRIG 55 10/05/2020 09:21 AM   HDL 46 10/05/2020 09:21 AM   CHOLHDL 2.4 06/15/2020 10:48 AM   CHOLHDL 2.9 04/27/2016 08:26 AM   LDLCALC 52 10/05/2020 09:21 AM    Wt Readings from Last 3 Encounters:  07/02/21 202 lb 12.8 oz (92 kg)  07/01/21 198 lb (89.8 kg)  06/10/21 200 lb (90.7 kg)     Objective:    Vital Signs:  BP 106/80   Pulse 63   Ht 5\' 11"  (1.803 m)   Wt 202 lb 12.8 oz (92 kg)   SpO2 95%   BMI 28.28 kg/m    GEN: Well nourished, well developed in no acute distress HEENT: Normal NECK: No JVD; No carotid bruits LYMPHATICS: No lymphadenopathy CARDIAC:RRR, no murmurs, rubs, gallops RESPIRATORY:  Clear to auscultation without rales, wheezing or rhonchi  ABDOMEN: Soft, non-tender, non-distended MUSCULOSKELETAL:  No edema; No deformity  SKIN: Warm and dry NEUROLOGIC:  Alert and oriented x 3 PSYCHIATRIC:  Normal affect   ASSESSMENT & PLAN:    1.  HTN -Bp is adequately controlled on exam today -His BP normalized after losing 80lbs and is now off BP meds  2.  ASCAD -cath in 2017 showing mild-moderate LAD, circumflex and RCA disease without high grade obstruction. -he has not had any anginal sx since I saw him last -continue ASA 81mg  daily, Imdur 60mg  daily, statin  3.  PVCs -last event monitor showed PVC load of 5.5% -he occasionally has a skipped heart beat but it does not bother him  4.   Bradycardia  -felt to be pseudobradycardia with PVCs  not being picked up on home monitor.  -His average HR by event monitor was 64bpm and 63bpm today -He is asymptomatic  5.  HLD -LDL goal < 70 -I have personally reviewed and interpreted outside labs performed by patient's PCP which showed LDL 52, HDL 46 in Oct 2021 and ALT 19 in April 2022  -Continue prescription drug management with Crestor 20mg  daily   6.  Orthostatic hypotension -this is stable with do recent dizziness unless he stands up too fast -encouraged him to stay hydrated when out in the heat  Medication Adjustments/Labs and Tests Ordered: Current medicines are reviewed at length with the patient today.  Concerns regarding medicines are outlined above.   Follow Up: 1 year  Signed, Fransico Him, MD  07/02/2021 9:15 AM    [facility]

## 2021-07-02 NOTE — Addendum Note (Signed)
Addended by: Antonieta Iba on: 07/02/2021 09:22 AM   Modules accepted: Orders

## 2021-07-07 ENCOUNTER — Encounter (INDEPENDENT_AMBULATORY_CARE_PROVIDER_SITE_OTHER): Payer: Self-pay | Admitting: Bariatrics

## 2021-07-07 DIAGNOSIS — K602 Anal fissure, unspecified: Secondary | ICD-10-CM | POA: Insufficient documentation

## 2021-07-07 NOTE — Progress Notes (Signed)
Chief Complaint:   OBESITY Christian [name] is here to discuss his progress with his obesity treatment plan along with follow-up of his obesity related diagnoses. Christian [name] is on the Category 1 Plan + 200 calories and states he is following his eating plan approximately 95% of the time. Christian [name] states he is walking and swimming 50-60 minutes 6-7 times per week.  Today's visit was #: 80 Starting weight: 278 lbs Starting date: 06/24/2020 Today's weight: 198 lbs Today's date: 07/01/2021 Total lbs lost to date: 80 Total lbs lost since last in-office visit: 4  Interim History: Christian [name] is down 4 lbs since his last visit. He has stabilized.  Subjective:   1. Essential hypertension BP controlled.  BP Readings from Last 3 Encounters:  07/02/21 106/80  07/01/21 128/82  06/10/21 122/74   2. Other hyperlipidemia Christian [name] is taking Crestor.  Lab Results  Component Value Date   ALT 19 03/30/2021   AST 15 03/30/2021   ALKPHOS 42 03/30/2021   BILITOT 0.5 03/30/2021   Lab Results  Component Value Date   CHOL 111 10/05/2020   HDL 46 10/05/2020   LDLCALC 52 10/05/2020   TRIG 55 10/05/2020   CHOLHDL 2.4 06/15/2020   Assessment/Plan:   1. Essential hypertension Christian [name] is working on healthy weight loss and exercise to improve blood pressure control. We will watch for signs of hypotension as he continues his lifestyle modifications. Continue to follow plan. No added salt.  2. Other hyperlipidemia Cardiovascular risk and specific lipid/LDL goals reviewed.  We discussed several lifestyle modifications today and Christian [name] will continue to work on diet, exercise and weight loss efforts. Orders and follow up as documented in patient record. Continue current treatment plan.  Counseling Intensive lifestyle modifications are the first line treatment for this issue. Dietary changes: Increase soluble fiber. Decrease simple carbohydrates. Exercise changes: Moderate to vigorous-intensity aerobic activity  150 minutes per week if tolerated. Lipid-lowering medications: see documented in medical record.  3. Obesity, current BMI 28.4  Christian [name] is currently in the action stage of change. As such, his goal is to continue with weight loss efforts. He has agreed to the Category 1 Plan + 200 calories.   Will continue to adhere closely to the plan. Keep protein levels high. Will continue to stabilize weight.  Exercise goals:  As is  Behavioral modification strategies: increasing lean protein intake, decreasing simple carbohydrates, increasing vegetables, increasing water intake, decreasing eating out, no skipping meals, meal planning and cooking strategies, keeping healthy foods in the home, and planning for success.  Christian [name] has agreed to follow-up with our clinic in 3-4 weeks. He was informed of the importance of frequent follow-up visits to maximize his success with intensive lifestyle modifications for his multiple health conditions.   Objective:   Blood pressure 128/82, pulse 62, temperature 97.9 F (36.6 C), height 5\' 10"  (1.778 m), weight 198 lb (89.8 kg), SpO2 97 %. Body mass index is 28.41 kg/m.  General: Cooperative, alert, well developed, in no acute distress. HEENT: Conjunctivae and lids unremarkable. Cardiovascular: Regular rhythm.  Lungs: Normal work of breathing. Neurologic: No focal deficits.   Lab Results  Component Value Date   CREATININE 0.89 03/30/2021   BUN 18 03/30/2021   NA 137 03/30/2021   K 3.8 03/30/2021   CL 109 03/30/2021   CO2 25 03/30/2021   Lab Results  Component Value Date   ALT 19 03/30/2021   AST 15 03/30/2021   ALKPHOS 42 03/30/2021   BILITOT 0.5 03/30/2021  Lab Results  Component Value Date   HGBA1C 5.4 06/24/2020   Lab Results  Component Value Date   INSULIN 4.4 01/26/2021   INSULIN 6.8 10/05/2020   INSULIN 12.1 06/24/2020   Lab Results  Component Value Date   TSH 3.670 06/24/2020   Lab Results  Component Value Date   CHOL 111  10/05/2020   HDL 46 10/05/2020   LDLCALC 52 10/05/2020   TRIG 55 10/05/2020   CHOLHDL 2.4 06/15/2020   Lab Results  Component Value Date   VD25OH 36.6 01/26/2021   VD25OH 55.0 10/05/2020   VD25OH 43.2 06/24/2020   Lab Results  Component Value Date   WBC 6.0 03/30/2021   HGB 14.1 03/30/2021   HCT 41.3 03/30/2021   MCV 99.5 03/30/2021   PLT 140 (L) 03/30/2021   No results found for: IRON, TIBC, FERRITIN  Obesity Behavioral Intervention:   Approximately 15 minutes were spent on the discussion below.  ASK: We discussed the diagnosis of obesity with Christian [name] today and Christian [name] agreed to give [name] permission to discuss obesity behavioral modification therapy today.  ASSESS: Christian [name] has the diagnosis of obesity and his BMI today is 28.4. Christian [name] is in the action stage of change.   ADVISE: Christian [name] was educated on the multiple health risks of obesity as well as the benefit of weight loss to improve his health. He was advised of the need for long term treatment and the importance of lifestyle modifications to improve his current health and to decrease his risk of future health problems.  AGREE: Multiple dietary modification options and treatment options were discussed and Christian [name] agreed to follow the recommendations documented in the above note.  ARRANGE: Christian [name] was educated on the importance of frequent visits to treat obesity as outlined per CMS and USPSTF guidelines and agreed to schedule his next follow up appointment today.  Attestation Statements:   Reviewed by clinician on day of visit: allergies, medications, problem list, medical history, surgical history, family history, social history, and previous encounter notes.  Coral Ceo, CMA, am acting as Location manager for [employer], DO.  I have reviewed the above documentation for accuracy and completeness, and I agree with the above. Jearld Lesch, DO

## 2021-07-18 ENCOUNTER — Other Ambulatory Visit: Payer: Self-pay | Admitting: Cardiology

## 2021-07-22 ENCOUNTER — Encounter (INDEPENDENT_AMBULATORY_CARE_PROVIDER_SITE_OTHER): Payer: Self-pay | Admitting: Bariatrics

## 2021-07-22 ENCOUNTER — Other Ambulatory Visit: Payer: Self-pay

## 2021-07-22 ENCOUNTER — Ambulatory Visit (INDEPENDENT_AMBULATORY_CARE_PROVIDER_SITE_OTHER): Payer: Medicare Other | Admitting: Bariatrics

## 2021-07-22 VITALS — BP 125/71 | HR 51 | Temp 97.6°F | Ht 71.0 in | Wt 197.0 lb

## 2021-07-22 DIAGNOSIS — I10 Essential (primary) hypertension: Secondary | ICD-10-CM | POA: Diagnosis not present

## 2021-07-22 DIAGNOSIS — E7849 Other hyperlipidemia: Secondary | ICD-10-CM

## 2021-07-22 DIAGNOSIS — Z6839 Body mass index (BMI) 39.0-39.9, adult: Secondary | ICD-10-CM

## 2021-07-26 NOTE — Progress Notes (Signed)
Chief Complaint:   OBESITY Derrall is here to discuss his progress with his obesity treatment plan along with follow-up of his obesity related diagnoses. Kareen is on the Category 1 Plan plus 100 calories and states he is following his eating plan approximately 90% of the time. Kyley states he is swimming for 60 minutes 7 times per week.  Today's visit was #: 19 Starting weight: 278 lbs Starting date: 06/24/2020 Today's weight: 197 lbs Today's date: 07/22/2021 Total lbs lost to date: 81 lbs Total lbs lost since last in-office visit: 1 lb  Interim History: Leevon is down 1 lb since his last visit.  Subjective:   1. Other hyperlipidemia Sirmichael is taking Crestor.  2. Essential hypertension Kyian is taking Imdur. His hypertension is well controlled.  Assessment/Plan:   1. Other hyperlipidemia Barret will continue taking Crestor.Cardiovascular risk and specific lipid/LDL goals reviewed.  We discussed several lifestyle modifications today and Hjalmar will continue to work on diet, exercise and weight loss efforts. Orders and follow up as documented in patient record.   Counseling Intensive lifestyle modifications are the first line treatment for this issue. Dietary changes: Increase soluble fiber. Decrease simple carbohydrates. Exercise changes: Moderate to vigorous-intensity aerobic activity 150 minutes per week if tolerated. Lipid-lowering medications: see documented in medical record.   2. Essential hypertension Jet will continue taking his medication. He is working on healthy weight loss and exercise to improve blood pressure control. We will watch for signs of hypotension as he continues his lifestyle modifications.   3. Obesity, current BMI 27.4 Jacorey is currently in the action stage of change. As such, his goal is to continue with weight loss efforts. He has agreed to the Category 1 Plan plus 100 calories.  Exercise goals:  Cassie will continue swimming  daily.  Behavioral modification strategies: increasing lean protein intake, decreasing simple carbohydrates, increasing vegetables, increasing water intake, decreasing eating out, no skipping meals, meal planning and cooking strategies, keeping healthy foods in the home, and planning for success.  Onis has agreed to follow-up with our clinic in 4 weeks. He was informed of the importance of frequent follow-up visits to maximize his success with intensive lifestyle modifications for his multiple health conditions.   Objective:   Blood pressure 125/71, pulse (!) 51, temperature 97.6 F (36.4 C), height '5\' 11"'$  (1.803 m), weight 197 lb (89.4 kg), SpO2 98 %. Body mass index is 27.48 kg/m.  General: Cooperative, alert, well developed, in no acute distress. HEENT: Conjunctivae and lids unremarkable. Cardiovascular: Regular rhythm.  Lungs: Normal work of breathing. Neurologic: No focal deficits.   Lab Results  Component Value Date   CREATININE 0.89 03/30/2021   BUN 18 03/30/2021   NA 137 03/30/2021   K 3.8 03/30/2021   CL 109 03/30/2021   CO2 25 03/30/2021   Lab Results  Component Value Date   ALT 19 03/30/2021   AST 15 03/30/2021   ALKPHOS 42 03/30/2021   BILITOT 0.5 03/30/2021   Lab Results  Component Value Date   HGBA1C 5.4 06/24/2020   Lab Results  Component Value Date   INSULIN 4.4 01/26/2021   INSULIN 6.8 10/05/2020   INSULIN 12.1 06/24/2020   Lab Results  Component Value Date   TSH 3.670 06/24/2020   Lab Results  Component Value Date   CHOL 111 10/05/2020   HDL 46 10/05/2020   LDLCALC 52 10/05/2020   TRIG 55 10/05/2020   CHOLHDL 2.4 06/15/2020   Lab Results  Component Value  Date   VD25OH 36.6 01/26/2021   VD25OH 55.0 10/05/2020   VD25OH 43.2 06/24/2020   Lab Results  Component Value Date   WBC 6.0 03/30/2021   HGB 14.1 03/30/2021   HCT 41.3 03/30/2021   MCV 99.5 03/30/2021   PLT 140 (L) 03/30/2021   No results found for: IRON, TIBC,  FERRITIN  Obesity Behavioral Intervention:   Approximately 15 minutes were spent on the discussion below.  ASK: We discussed the diagnosis of obesity with [name] today and Deja agreed to give [name] permission to discuss obesity behavioral modification therapy today.  ASSESS: Haoran has the diagnosis of obesity and his BMI today is 27.5. Joseramon is in the action stage of change.   ADVISE: Maykel was educated on the multiple health risks of obesity as well as the benefit of weight loss to improve his health. He was advised of the need for long term treatment and the importance of lifestyle modifications to improve his current health and to decrease his risk of future health problems.  AGREE: Multiple dietary modification options and treatment options were discussed and Devaughn agreed to follow the recommendations documented in the above note.  ARRANGE: Simba was educated on the importance of frequent visits to treat obesity as outlined per CMS and USPSTF guidelines and agreed to schedule his next follow up appointment today.  Attestation Statements:   Reviewed by clinician on day of visit: allergies, medications, problem list, medical history, surgical history, family history, social history, and previous encounter notes.  I, Lizbeth Bark, RMA, am acting as Location manager for [employer], DO.   I have reviewed the above documentation for accuracy and completeness, and I agree with the above. Jearld Lesch, DO

## 2021-07-27 ENCOUNTER — Encounter (INDEPENDENT_AMBULATORY_CARE_PROVIDER_SITE_OTHER): Payer: Self-pay | Admitting: Bariatrics

## 2021-08-03 DIAGNOSIS — H26493 Other secondary cataract, bilateral: Secondary | ICD-10-CM | POA: Diagnosis not present

## 2021-08-03 DIAGNOSIS — H40013 Open angle with borderline findings, low risk, bilateral: Secondary | ICD-10-CM | POA: Diagnosis not present

## 2021-08-03 DIAGNOSIS — H35033 Hypertensive retinopathy, bilateral: Secondary | ICD-10-CM | POA: Diagnosis not present

## 2021-08-03 DIAGNOSIS — Z961 Presence of intraocular lens: Secondary | ICD-10-CM | POA: Diagnosis not present

## 2021-08-19 ENCOUNTER — Ambulatory Visit (INDEPENDENT_AMBULATORY_CARE_PROVIDER_SITE_OTHER): Payer: Medicare Other | Admitting: Bariatrics

## 2021-08-26 DIAGNOSIS — K602 Anal fissure, unspecified: Secondary | ICD-10-CM | POA: Diagnosis not present

## 2021-08-31 ENCOUNTER — Ambulatory Visit (INDEPENDENT_AMBULATORY_CARE_PROVIDER_SITE_OTHER): Payer: Medicare Other | Admitting: Bariatrics

## 2021-08-31 ENCOUNTER — Encounter (INDEPENDENT_AMBULATORY_CARE_PROVIDER_SITE_OTHER): Payer: Self-pay | Admitting: Bariatrics

## 2021-08-31 ENCOUNTER — Other Ambulatory Visit: Payer: Self-pay

## 2021-08-31 VITALS — BP 131/81 | HR 52 | Temp 97.6°F | Ht 71.0 in | Wt 198.0 lb

## 2021-08-31 DIAGNOSIS — Z6839 Body mass index (BMI) 39.0-39.9, adult: Secondary | ICD-10-CM | POA: Diagnosis not present

## 2021-08-31 DIAGNOSIS — E7849 Other hyperlipidemia: Secondary | ICD-10-CM | POA: Diagnosis not present

## 2021-08-31 DIAGNOSIS — I10 Essential (primary) hypertension: Secondary | ICD-10-CM | POA: Diagnosis not present

## 2021-09-01 NOTE — Progress Notes (Signed)
Chief Complaint:   OBESITY Christian [name] is here to discuss his progress with his obesity treatment plan along with follow-up of his obesity related diagnoses. Christian [name] is on the Category 1 Plan + 100 calories and states he is following his eating plan approximately 95% of the time. Christian [name] states he is swimming for 60 minutes 7 times per week and walking for 50 minutes 6 times per week.  Today's visit was #: 18 Starting weight: 278 lbs Starting date: 06/24/2020 Today's weight: 198 lbs Today's date: 08/31/2021 Total lbs lost to date: 80 lbs Total lbs lost since last in-office visit: 0  Interim History: Christian [name] is up 1 lb and has been maintaining his weight nicely. The bioimpedance scale shows that he is up about 1 lb of water. He is not usually hungry.  Subjective:   1. Essential hypertension Christian [name] is currently taking Lasix and Imdur. His hypertension is controlled.  2. Other hyperlipidemia Christian [name] is currently taking Crestor.  Assessment/Plan:   1. Essential hypertension Christian [name] will continue his medication. He is working on healthy weight loss and exercise to improve blood pressure control. We will watch for signs of hypotension as he continues his lifestyle modifications.  2. Other hyperlipidemia Cardiovascular risk and specific lipid/LDL goals reviewed.  We discussed several lifestyle modifications today and Christian [name] will continue to work on diet, exercise and weight loss efforts. Christian [name] will continue taking Crestor. Orders and follow up as documented in patient record.   Counseling Intensive lifestyle modifications are the first line treatment for this issue. Dietary changes: Increase soluble fiber. Decrease simple carbohydrates. Exercise changes: Moderate to vigorous-intensity aerobic activity 150 minutes per week if tolerated. Lipid-lowering medications: see documented in medical record.   3. Obesity, current BMI 27.7 Christian [name] is currently in the action stage of change.  As such, his goal is to continue with weight loss efforts. He has agreed to the Category 1 Plan + 100 calories.   Christian [name] will continue meal planning. He will continue to adhere closely to the plan. He will keep protein level high.  Exercise goals:  As is.  Behavioral modification strategies: increasing lean protein intake, decreasing simple carbohydrates, increasing vegetables, increasing water intake, decreasing eating out, no skipping meals, meal planning and cooking strategies, keeping healthy foods in the home, and planning for success.  Christian [name] has agreed to follow-up with our clinic in 3-4 weeks(fasting). He was informed of the importance of frequent follow-up visits to maximize his success with intensive lifestyle modifications for his multiple health conditions.   Objective:   Blood pressure 131/81, pulse (!) 52, temperature 97.6 F (36.4 C), height '5\' 11"'$  (1.803 m), weight 198 lb (89.8 kg), SpO2 97 %. Body mass index is 27.62 kg/m.  General: Cooperative, alert, well developed, in no acute distress. HEENT: Conjunctivae and lids unremarkable. Cardiovascular: Regular rhythm.  Lungs: Normal work of breathing. Neurologic: No focal deficits.   Lab Results  Component Value Date   CREATININE 0.89 03/30/2021   BUN 18 03/30/2021   NA 137 03/30/2021   K 3.8 03/30/2021   CL 109 03/30/2021   CO2 25 03/30/2021   Lab Results  Component Value Date   ALT 19 03/30/2021   AST 15 03/30/2021   ALKPHOS 42 03/30/2021   BILITOT 0.5 03/30/2021   Lab Results  Component Value Date   HGBA1C 5.4 06/24/2020   Lab Results  Component Value Date   INSULIN 4.4 01/26/2021   INSULIN 6.8 10/05/2020   INSULIN 12.1 06/24/2020   Lab  Results  Component Value Date   TSH 3.670 06/24/2020   Lab Results  Component Value Date   CHOL 111 10/05/2020   HDL 46 10/05/2020   LDLCALC 52 10/05/2020   TRIG 55 10/05/2020   CHOLHDL 2.4 06/15/2020   Lab Results  Component Value Date   VD25OH 36.6  01/26/2021   VD25OH 55.0 10/05/2020   VD25OH 43.2 06/24/2020   Lab Results  Component Value Date   WBC 6.0 03/30/2021   HGB 14.1 03/30/2021   HCT 41.3 03/30/2021   MCV 99.5 03/30/2021   PLT 140 (L) 03/30/2021   No results found for: IRON, TIBC, FERRITIN  Obesity Behavioral Intervention:   Approximately 15 minutes were spent on the discussion below.  ASK: We discussed the diagnosis of obesity with Christian [name] today and Christian [name] agreed to give [name] permission to discuss obesity behavioral modification therapy today.  ASSESS: Christian [name] has the diagnosis of obesity and his BMI today is 27.7. Christian [name] is in the action stage of change.   ADVISE: Christian [name] was educated on the multiple health risks of obesity as well as the benefit of weight loss to improve his health. He was advised of the need for long term treatment and the importance of lifestyle modifications to improve his current health and to decrease his risk of future health problems.  AGREE: Multiple dietary modification options and treatment options were discussed and Christian [name] agreed to follow the recommendations documented in the above note.  ARRANGE: Christian [name] was educated on the importance of frequent visits to treat obesity as outlined per CMS and USPSTF guidelines and agreed to schedule his next follow up appointment today.  Attestation Statements:   Reviewed by clinician on day of visit: allergies, medications, problem list, medical history, surgical history, family history, social history, and previous encounter notes.  I, Lizbeth Bark, RMA, am acting as Location manager for [employer], DO.   I have reviewed the above documentation for accuracy and completeness, and I agree with the above. Jearld Lesch, DO

## 2021-09-07 DIAGNOSIS — H26492 Other secondary cataract, left eye: Secondary | ICD-10-CM | POA: Diagnosis not present

## 2021-09-11 DIAGNOSIS — Z1152 Encounter for screening for COVID-19: Secondary | ICD-10-CM | POA: Diagnosis not present

## 2021-09-28 ENCOUNTER — Ambulatory Visit (INDEPENDENT_AMBULATORY_CARE_PROVIDER_SITE_OTHER): Payer: Medicare Other | Admitting: Bariatrics

## 2021-09-28 ENCOUNTER — Other Ambulatory Visit: Payer: Self-pay

## 2021-09-28 ENCOUNTER — Encounter (INDEPENDENT_AMBULATORY_CARE_PROVIDER_SITE_OTHER): Payer: Self-pay | Admitting: Bariatrics

## 2021-09-28 VITALS — BP 146/88 | HR 52 | Temp 97.7°F | Ht 71.0 in | Wt 197.0 lb

## 2021-09-28 DIAGNOSIS — I10 Essential (primary) hypertension: Secondary | ICD-10-CM | POA: Diagnosis not present

## 2021-09-28 DIAGNOSIS — E559 Vitamin D deficiency, unspecified: Secondary | ICD-10-CM | POA: Diagnosis not present

## 2021-09-28 DIAGNOSIS — Z6839 Body mass index (BMI) 39.0-39.9, adult: Secondary | ICD-10-CM

## 2021-09-28 DIAGNOSIS — E8881 Metabolic syndrome: Secondary | ICD-10-CM

## 2021-09-28 DIAGNOSIS — R7309 Other abnormal glucose: Secondary | ICD-10-CM | POA: Diagnosis not present

## 2021-09-28 NOTE — Progress Notes (Signed)
Chief Complaint:   OBESITY Christian [name] is here to discuss his progress with his obesity treatment plan along with follow-up of his obesity related diagnoses. Christian [name] is on the Category 1 Plan plus 100 calories and states he is following his eating plan approximately 90% of the time. Christian [name] states he is swimming for 60 minutes 7 times per week.  Today's visit was #: 43 Starting weight: 278 lbs Starting date: 06/24/2020 Today's weight: 197 lbs Today's date: 09/28/2021 Total lbs lost to date: 81 lbs Total lbs lost since last in-office visit: 1 lb  Interim History: Christian [name] is down 1 lb since his last visit and is maintaining his current weight well.   Subjective:   1. Essential hypertension Christian [name] is currently taking Imdur. His blood pressure is reasonably well controlled.   2. Vitamin D insufficiency Christian [name] is taking OTC Vitamin D currently.   3. Insulin resistance Christian [name] is currently not on medications. We reviewed his last labs.  Assessment/Plan:   1. Essential hypertension Christian [name] is working on healthy weight loss and exercise to improve blood pressure control. Christian [name] will continue medications. We will check labs today. We will watch for signs of hypotension as he continues his lifestyle modifications.  - Lipid Panel With LDL/HDL Ratio - Comprehensive metabolic panel  2. Vitamin D insufficiency Low Vitamin D level contributes to fatigue and are associated with obesity, breast, and colon cancer. Christian [name] agrees to continue to take OTC Vitamin D3 1,000 IU daily and he will follow-up for routine testing of Vitamin D, at least 2-3 times per year to avoid over-replacement.  - VITAMIN D 25 Hydroxy (Vit-D Deficiency, Fractures)  3. Insulin resistance Christian [name] will continue to work on weight loss, exercise, and decreasing simple carbohydrates to help decrease the risk of diabetes. We will check labs today. Christian [name] agreed to follow-up with Christian [name] as directed to closely monitor his  progress.  - Hemoglobin A1c - Insulin, random - Lipid Panel With LDL/HDL Ratio - Comprehensive metabolic panel  4. Obesity, current BMI 27.5 Christian [name] is currently in the action stage of change. As such, his goal is to continue with weight loss efforts. He has agreed to the Category 1 Plan plus 100 calories.   Christian [name] will keep protein high. He will stay closely adherent to the plan (80-90%). He will shoot for the high protein.   Exercise goals:  Christian [name] will continue to swim.  Behavioral modification strategies: increasing lean protein intake, decreasing simple carbohydrates, increasing vegetables, increasing water intake, decreasing eating out, no skipping meals, meal planning and cooking strategies, keeping healthy foods in the home, and planning for success.  Christian [name] has agreed to follow-up with our clinic in 4 weeks. He was informed of the importance of frequent follow-up visits to maximize his success with intensive lifestyle modifications for his multiple health conditions.   Christian [name] was informed we would discuss his lab results at his next visit unless there is a critical issue that needs to be addressed sooner. Christian [name] agreed to keep his next visit at the agreed upon time to discuss these results.  Objective:   Blood pressure (!) 146/88, pulse (!) 52, temperature 97.7 F (36.5 C), height 5\' 11"  (1.803 m), weight 197 lb (89.4 kg), SpO2 97 %. Body mass index is 27.48 kg/m.  General: Cooperative, alert, well developed, in no acute distress. HEENT: Conjunctivae and lids unremarkable. Cardiovascular: Regular rhythm.  Lungs: Normal work of breathing. Neurologic: No focal deficits.   Lab Results  Component Value Date  CREATININE 0.89 03/30/2021   BUN 18 03/30/2021   NA 137 03/30/2021   K 3.8 03/30/2021   CL 109 03/30/2021   CO2 25 03/30/2021   Lab Results  Component Value Date   ALT 19 03/30/2021   AST 15 03/30/2021   ALKPHOS 42 03/30/2021   BILITOT 0.5 03/30/2021    Lab Results  Component Value Date   HGBA1C 5.4 06/24/2020   Lab Results  Component Value Date   INSULIN 4.4 01/26/2021   INSULIN 6.8 10/05/2020   INSULIN 12.1 06/24/2020   Lab Results  Component Value Date   TSH 3.670 06/24/2020   Lab Results  Component Value Date   CHOL 111 10/05/2020   HDL 46 10/05/2020   LDLCALC 52 10/05/2020   TRIG 55 10/05/2020   CHOLHDL 2.4 06/15/2020   Lab Results  Component Value Date   VD25OH 36.6 01/26/2021   VD25OH 55.0 10/05/2020   VD25OH 43.2 06/24/2020   Lab Results  Component Value Date   WBC 6.0 03/30/2021   HGB 14.1 03/30/2021   HCT 41.3 03/30/2021   MCV 99.5 03/30/2021   PLT 140 (L) 03/30/2021   No results found for: IRON, TIBC, FERRITIN  Attestation Statements:   Reviewed by clinician on day of visit: allergies, medications, problem list, medical history, surgical history, family history, social history, and previous encounter notes.  I, Lizbeth Bark, RMA, am acting as Location manager for [employer], DO.   I have reviewed the above documentation for accuracy and completeness, and I agree with the above. Jearld Lesch, DO

## 2021-09-29 LAB — VITAMIN D 25 HYDROXY (VIT D DEFICIENCY, FRACTURES): Vit D, 25-Hydroxy: 58.9 ng/mL (ref 30.0–100.0)

## 2021-09-29 LAB — COMPREHENSIVE METABOLIC PANEL
ALT: 14 IU/L (ref 0–44)
AST: 19 IU/L (ref 0–40)
Albumin/Globulin Ratio: 1.7 (ref 1.2–2.2)
Albumin: 4 g/dL (ref 3.7–4.7)
Alkaline Phosphatase: 81 IU/L (ref 44–121)
BUN/Creatinine Ratio: 11 (ref 10–24)
BUN: 11 mg/dL (ref 8–27)
Bilirubin Total: 0.7 mg/dL (ref 0.0–1.2)
CO2: 25 mmol/L (ref 20–29)
Calcium: 9.3 mg/dL (ref 8.6–10.2)
Chloride: 102 mmol/L (ref 96–106)
Creatinine, Ser: 0.97 mg/dL (ref 0.76–1.27)
Globulin, Total: 2.4 g/dL (ref 1.5–4.5)
Glucose: 85 mg/dL (ref 70–99)
Potassium: 5 mmol/L (ref 3.5–5.2)
Sodium: 141 mmol/L (ref 134–144)
Total Protein: 6.4 g/dL (ref 6.0–8.5)
eGFR: 82 mL/min/{1.73_m2} (ref >59)

## 2021-09-29 LAB — LIPID PANEL WITH LDL/HDL RATIO
Cholesterol, Total: 117 mg/dL (ref 100–199)
HDL: 56 mg/dL (ref >39)
LDL Chol Calc (NIH): 49 mg/dL (ref 0–99)
LDL/HDL Ratio: 0.9 ratio (ref 0.0–3.6)
Triglycerides: 51 mg/dL (ref 0–149)
VLDL Cholesterol Cal: 12 mg/dL (ref 5–40)

## 2021-09-29 LAB — HEMOGLOBIN A1C
Est. average glucose Bld gHb Est-mCnc: 105 mg/dL
Hgb A1c MFr Bld: 5.3 % (ref 4.8–5.6)

## 2021-09-29 LAB — INSULIN, RANDOM: INSULIN: 5.1 u[IU]/mL (ref 2.6–24.9)

## 2021-10-12 DIAGNOSIS — Z1152 Encounter for screening for COVID-19: Secondary | ICD-10-CM | POA: Diagnosis not present

## 2021-10-13 ENCOUNTER — Ambulatory Visit: Payer: Medicare Other | Attending: Internal Medicine

## 2021-10-13 ENCOUNTER — Other Ambulatory Visit (HOSPITAL_BASED_OUTPATIENT_CLINIC_OR_DEPARTMENT_OTHER): Payer: Self-pay

## 2021-10-13 DIAGNOSIS — Z23 Encounter for immunization: Secondary | ICD-10-CM

## 2021-10-13 MED ORDER — INFLUENZA VAC A&B SA ADJ QUAD 0.5 ML IM PRSY
PREFILLED_SYRINGE | INTRAMUSCULAR | 0 refills | Status: DC
Start: 2021-10-13 — End: 2022-08-23
  Filled 2021-10-13: qty 0.5, 1d supply, fill #0

## 2021-10-13 NOTE — Progress Notes (Signed)
   Covid-19 Vaccination Clinic  Name:  SAM OVERBECK    MRN: [id number] DOB: 09-09-1946  10/13/2021  Mr. Brink was observed post Covid-19 immunization for 15 minutes without incident. He was provided with Vaccine Information Sheet and instruction to access the V-Safe system.   Mr. Mitchum was instructed to call 911 with any severe reactions post vaccine: Difficulty breathing  Swelling of face and throat  A fast heartbeat  A bad rash all over body  Dizziness and weakness

## 2021-10-26 ENCOUNTER — Encounter (INDEPENDENT_AMBULATORY_CARE_PROVIDER_SITE_OTHER): Payer: Self-pay | Admitting: Bariatrics

## 2021-10-26 ENCOUNTER — Other Ambulatory Visit: Payer: Self-pay

## 2021-10-26 ENCOUNTER — Ambulatory Visit (INDEPENDENT_AMBULATORY_CARE_PROVIDER_SITE_OTHER): Payer: Medicare Other | Admitting: Bariatrics

## 2021-10-26 VITALS — BP 126/81 | HR 58 | Temp 97.7°F | Ht 71.0 in | Wt 198.0 lb

## 2021-10-26 DIAGNOSIS — I10 Essential (primary) hypertension: Secondary | ICD-10-CM | POA: Diagnosis not present

## 2021-10-26 DIAGNOSIS — Z6839 Body mass index (BMI) 39.0-39.9, adult: Secondary | ICD-10-CM | POA: Diagnosis not present

## 2021-10-26 DIAGNOSIS — E7849 Other hyperlipidemia: Secondary | ICD-10-CM

## 2021-10-26 NOTE — Progress Notes (Signed)
Chief Complaint:   OBESITY Christian [name] is here to discuss his progress with his obesity treatment plan along with follow-up of his obesity related diagnoses. Christian [name] is on the Category 1 Plan and states he is following his eating plan approximately 90% of the time. Christian [name] states he is walking for 2 hours 6 times per week.  Today's visit was #: 20 Starting weight: 278 lbs Starting date: 06/24/2020 Today's weight: 198 lbs Today's date: 10/26/2021 Total lbs lost to date: 80 lbs Total lbs lost since last in-office visit: 0  Interim History: Christian [name] is up 1 lb since his last visit. He is walking 10,000 steps.   Subjective:   1. Primary hypertension Christian [name] is currently taking Imdur. His blood pressure is controlled.   2. Other hyperlipidemia Christian [name] is taking Crestor currently.   Assessment/Plan:   1. Primary hypertension Christian [name] will continue his medications. He is working on healthy weight loss and exercise to improve blood pressure control. We will watch for signs of hypotension as he continues his lifestyle modifications.  2. Other hyperlipidemia Cardiovascular risk and specific lipid/LDL goals reviewed.  We discussed several lifestyle modifications today and Christian [name] will continue to work on diet, exercise and weight loss efforts. Christian [name] will continue Crestor. Orders and follow up as documented in patient record.   Counseling Intensive lifestyle modifications are the first line treatment for this issue. Dietary changes: Increase soluble fiber. Decrease simple carbohydrates. Exercise changes: Moderate to vigorous-intensity aerobic activity 150 minutes per week if tolerated. Lipid-lowering medications: see documented in medical record.   3. Obesity BMI today 82 Christian [name] is currently in the action stage of change. As such, his goal is to continue with weight loss efforts. He has agreed to the Category 1 Plan.   Christian [name] will continue meal planning and intentional eating.  Recipes was given out today.   Exercise goals: Christian [name] will continue walking and will continue with the winter. 10,000 steps.   Behavioral modification strategies: increasing lean protein intake, decreasing simple carbohydrates, increasing vegetables, increasing water intake, decreasing eating out, no skipping meals, meal planning and cooking strategies, keeping healthy foods in the home, and planning for success.  Christian [name] has agreed to follow-up with our clinic in 6 weeks. He was informed of the importance of frequent follow-up visits to maximize his success with intensive lifestyle modifications for his multiple health conditions.   Objective:   Blood pressure 126/81, pulse (!) 58, temperature 97.7 F (36.5 C), height 5\' 11"  (1.803 m), weight 198 lb (89.8 kg), SpO2 98 %. Body mass index is 27.62 kg/m.  General: Cooperative, alert, well developed, in no acute distress. HEENT: Conjunctivae and lids unremarkable. Cardiovascular: Regular rhythm.  Lungs: Normal work of breathing. Neurologic: No focal deficits.   Lab Results  Component Value Date   CREATININE 0.97 09/28/2021   BUN 11 09/28/2021   NA 141 09/28/2021   K 5.0 09/28/2021   CL 102 09/28/2021   CO2 25 09/28/2021   Lab Results  Component Value Date   ALT 14 09/28/2021   AST 19 09/28/2021   ALKPHOS 81 09/28/2021   BILITOT 0.7 09/28/2021   Lab Results  Component Value Date   HGBA1C 5.3 09/28/2021   HGBA1C 5.4 06/24/2020   Lab Results  Component Value Date   INSULIN 5.1 09/28/2021   INSULIN 4.4 01/26/2021   INSULIN 6.8 10/05/2020   INSULIN 12.1 06/24/2020   Lab Results  Component Value Date   TSH 3.670 06/24/2020   Lab Results  Component  Value Date   CHOL 117 09/28/2021   HDL 56 09/28/2021   LDLCALC 49 09/28/2021   TRIG 51 09/28/2021   CHOLHDL 2.4 06/15/2020   Lab Results  Component Value Date   VD25OH 58.9 09/28/2021   VD25OH 36.6 01/26/2021   VD25OH 55.0 10/05/2020   Lab Results  Component Value  Date   WBC 6.0 03/30/2021   HGB 14.1 03/30/2021   HCT 41.3 03/30/2021   MCV 99.5 03/30/2021   PLT 140 (L) 03/30/2021   No results found for: IRON, TIBC, FERRITIN  Obesity Behavioral Intervention:   Approximately 15 minutes were spent on the discussion below.  ASK: We discussed the diagnosis of obesity with Christian [name] today and Christian [name] agreed to give [name] permission to discuss obesity behavioral modification therapy today.  ASSESS: Christian [name] has the diagnosis of obesity and his BMI today is 27.6. Christian [name] is in the action stage of change.   ADVISE: Christian [name] was educated on the multiple health risks of obesity as well as the benefit of weight loss to improve his health. He was advised of the need for long term treatment and the importance of lifestyle modifications to improve his current health and to decrease his risk of future health problems.  AGREE: Multiple dietary modification options and treatment options were discussed and Christian [name] agreed to follow the recommendations documented in the above note.  ARRANGE: Christian [name] was educated on the importance of frequent visits to treat obesity as outlined per CMS and USPSTF guidelines and agreed to schedule his next follow up appointment today.  Attestation Statements:   Reviewed by clinician on day of visit: allergies, medications, problem list, medical history, surgical history, family history, social history, and previous encounter notes.  I, Lizbeth Bark, RMA, am acting as Location manager for [employer], DO.   I have reviewed the above documentation for accuracy and completeness, and I agree with the above. Jearld Lesch, DO

## 2021-10-27 ENCOUNTER — Encounter (INDEPENDENT_AMBULATORY_CARE_PROVIDER_SITE_OTHER): Payer: Self-pay | Admitting: Bariatrics

## 2021-11-04 DIAGNOSIS — L84 Corns and callosities: Secondary | ICD-10-CM | POA: Diagnosis not present

## 2021-11-04 DIAGNOSIS — Q828 Other specified congenital malformations of skin: Secondary | ICD-10-CM | POA: Diagnosis not present

## 2021-11-09 ENCOUNTER — Other Ambulatory Visit (HOSPITAL_BASED_OUTPATIENT_CLINIC_OR_DEPARTMENT_OTHER): Payer: Self-pay

## 2021-11-09 MED ORDER — PFIZER COVID-19 VAC BIVALENT 30 MCG/0.3ML IM SUSP
INTRAMUSCULAR | 0 refills | Status: DC
  Filled 2021-11-09: qty 0.3, 1d supply, fill #0

## 2021-11-24 DIAGNOSIS — Z20828 Contact with and (suspected) exposure to other viral communicable diseases: Secondary | ICD-10-CM | POA: Diagnosis not present

## 2021-11-24 DIAGNOSIS — Z0000 Encounter for general adult medical examination without abnormal findings: Secondary | ICD-10-CM | POA: Diagnosis not present

## 2021-11-24 DIAGNOSIS — M545 Low back pain, unspecified: Secondary | ICD-10-CM | POA: Diagnosis not present

## 2021-12-13 ENCOUNTER — Ambulatory Visit (INDEPENDENT_AMBULATORY_CARE_PROVIDER_SITE_OTHER): Payer: Medicare Other | Admitting: Bariatrics

## 2021-12-13 ENCOUNTER — Other Ambulatory Visit: Payer: Self-pay

## 2021-12-13 ENCOUNTER — Encounter (INDEPENDENT_AMBULATORY_CARE_PROVIDER_SITE_OTHER): Payer: Self-pay | Admitting: Bariatrics

## 2021-12-13 VITALS — BP 109/69 | HR 64 | Temp 97.7°F | Ht 71.0 in | Wt 199.0 lb

## 2021-12-13 DIAGNOSIS — E8881 Metabolic syndrome: Secondary | ICD-10-CM

## 2021-12-13 DIAGNOSIS — E7849 Other hyperlipidemia: Secondary | ICD-10-CM | POA: Diagnosis not present

## 2021-12-13 NOTE — Progress Notes (Signed)
Chief Complaint:   OBESITY Christian [name] is here to discuss his progress with his obesity treatment plan along with follow-up of his obesity related diagnoses. Christian [name] is on the Category 1 Plan plus 100 calories and states he is following his eating plan approximately 90% of the time. Christian [name] states he is walking for 90 minutes 7 times per week.  Today's visit was #: 21 Starting weight: 278 lbs Starting date: 06/24/2020 Today's weight: 199 lbs Today's date: 12/13/2021 Total lbs lost to date: 79 lbs Total lbs lost since last in-office visit: 0  Interim History: Christian [name] is up 1 lb, but had done exceptionally well and is maintaining nicely.   Subjective:   1. Insulin resistance Christian [name] is currently not on medications.   2. Other hyperlipidemia Christian [name] is taking Crestor currently.   Assessment/Plan:   1. Insulin resistance Christian [name] will continue to work on weight loss, exercise, and decreasing simple carbohydrates to help decrease the risk of diabetes. He will kept calories within meal plan range. Christian [name] agreed to follow-up with Christian [name] as directed to closely monitor his progress.  2. Other hyperlipidemia Cardiovascular risk and specific lipid/LDL goals reviewed.  We discussed several lifestyle modifications today and Christian [name] will continue to work on diet, exercise and weight loss efforts. Christian [name] will continue taking his medications. Orders and follow up as documented in patient record.   Counseling Intensive lifestyle modifications are the first line treatment for this issue. Dietary changes: Increase soluble fiber. Decrease simple carbohydrates. Exercise changes: Moderate to vigorous-intensity aerobic activity 150 minutes per week if tolerated. Lipid-lowering medications: see documented in medical record.  3. Obesity, current BMI 27.8 Legion is currently in the action stage of change. As such, his goal is to continue with weight loss efforts. He has agreed to the Category 1 Plan.    Christian [name] will continue meal planning and he will continue intentional eating. He will keep calories low and variety high with meal planning   Exercise goals:  As is. Christian [name] will add weight training on with walking.  Behavioral modification strategies: increasing lean protein intake, decreasing simple carbohydrates, increasing vegetables, increasing water intake, decreasing eating out, no skipping meals, meal planning and cooking strategies, keeping healthy foods in the home, and planning for success.  Christian [name] has agreed to follow-up with our clinic in 4-6 weeks. He was informed of the importance of frequent follow-up visits to maximize his success with intensive lifestyle modifications for his multiple health conditions.   Objective:   Blood pressure 109/69, pulse 64, temperature 97.7 F (36.5 C), height 5\' 11"  (1.803 m), weight 199 lb (90.3 kg), SpO2 98 %. Body mass index is 27.75 kg/m.  General: Cooperative, alert, well developed, in no acute distress. HEENT: Conjunctivae and lids unremarkable. Cardiovascular: Regular rhythm.  Lungs: Normal work of breathing. Neurologic: No focal deficits.   Lab Results  Component Value Date   CREATININE 0.97 09/28/2021   BUN 11 09/28/2021   NA 141 09/28/2021   K 5.0 09/28/2021   CL 102 09/28/2021   CO2 25 09/28/2021   Lab Results  Component Value Date   ALT 14 09/28/2021   AST 19 09/28/2021   ALKPHOS 81 09/28/2021   BILITOT 0.7 09/28/2021   Lab Results  Component Value Date   HGBA1C 5.3 09/28/2021   HGBA1C 5.4 06/24/2020   Lab Results  Component Value Date   INSULIN 5.1 09/28/2021   INSULIN 4.4 01/26/2021   INSULIN 6.8 10/05/2020   INSULIN 12.1 06/24/2020   Lab Results  Component Value Date   TSH 3.670 06/24/2020   Lab Results  Component Value Date   CHOL 117 09/28/2021   HDL 56 09/28/2021   LDLCALC 49 09/28/2021   TRIG 51 09/28/2021   CHOLHDL 2.4 06/15/2020   Lab Results  Component Value Date   VD25OH 58.9  09/28/2021   VD25OH 36.6 01/26/2021   VD25OH 55.0 10/05/2020   Lab Results  Component Value Date   WBC 6.0 03/30/2021   HGB 14.1 03/30/2021   HCT 41.3 03/30/2021   MCV 99.5 03/30/2021   PLT 140 (L) 03/30/2021   No results found for: IRON, TIBC, FERRITIN  Attestation Statements:   Reviewed by clinician on day of visit: allergies, medications, problem list, medical history, surgical history, family history, social history, and previous encounter notes.  I, Lizbeth Bark, RMA, am acting as Location manager for [employer], DO.  I have reviewed the above documentation for accuracy and completeness, and I agree with the above. Jearld Lesch, DO

## 2021-12-14 ENCOUNTER — Encounter (INDEPENDENT_AMBULATORY_CARE_PROVIDER_SITE_OTHER): Payer: Self-pay | Admitting: Bariatrics

## 2022-01-10 ENCOUNTER — Ambulatory Visit (INDEPENDENT_AMBULATORY_CARE_PROVIDER_SITE_OTHER): Payer: Medicare Other | Admitting: Bariatrics

## 2022-01-12 ENCOUNTER — Encounter (INDEPENDENT_AMBULATORY_CARE_PROVIDER_SITE_OTHER): Payer: Self-pay | Admitting: Bariatrics

## 2022-01-12 ENCOUNTER — Ambulatory Visit (INDEPENDENT_AMBULATORY_CARE_PROVIDER_SITE_OTHER): Payer: Medicare Other | Admitting: Bariatrics

## 2022-01-12 ENCOUNTER — Other Ambulatory Visit: Payer: Self-pay

## 2022-01-12 VITALS — BP 137/87 | HR 58 | Temp 97.7°F | Ht 71.0 in | Wt 202.0 lb

## 2022-01-12 DIAGNOSIS — I10 Essential (primary) hypertension: Secondary | ICD-10-CM | POA: Diagnosis not present

## 2022-01-12 DIAGNOSIS — E8881 Metabolic syndrome: Secondary | ICD-10-CM | POA: Diagnosis not present

## 2022-01-12 DIAGNOSIS — Z6828 Body mass index (BMI) 28.0-28.9, adult: Secondary | ICD-10-CM | POA: Diagnosis not present

## 2022-01-12 DIAGNOSIS — Z6839 Body mass index (BMI) 39.0-39.9, adult: Secondary | ICD-10-CM

## 2022-01-13 NOTE — Progress Notes (Signed)
Chief Complaint:   OBESITY Christian [name] is here to discuss his progress with his obesity treatment plan along with follow-up of his obesity related diagnoses. Christian [name] is on the Category 1 Plan plus 100 calories and states he is following his eating plan approximately 95% of the time. Christian [name] states he is walking for 90 minutes 6 times per week.  Today's visit was #: 22 Starting weight: 278 lbs Starting date: 06/24/2020 Today's weight: 202 lbs Today's date: 01/12/2022 Total lbs lost to date: 76 lbs Total lbs lost since last in-office visit: 0  Interim History: Christian [name] is up 3 lbs since his last visit before the holidays. Per the bioimpedance his total body water is up 1.6 lbs and muscle mass is up almost 3 lbs.   Subjective:   1. Insulin resistance Christian [name] is not on medications currently.  2. Essential hypertension Christian [name]'s blood pressure is controlled. His last blood pressure was 109/69.  Assessment/Plan:   1. Insulin resistance Christian [name] will continue to work on weight loss, exercise, and decreasing simple carbohydrates to help decrease the risk of diabetes. He will keep protein and healthy fats high. Christian [name] agreed to follow-up with [name] as directed to closely monitor his progress.  2. Essential hypertension Christian [name] will continue taking his medications. He is working on healthy weight loss and exercise to improve blood pressure control. We will watch for signs of hypotension as he continues his lifestyle modifications.  3. Obesity, current BMI 28.3 Christian [name] is currently in the action stage of change. As such, his goal is to continue with weight loss efforts. He has agreed to the Category 1 Plan plus 100 calories.   Christian [name] will continue meal planning and she will continue intentional eating. She will keep water and protein high.   Exercise goals:  As is.  Behavioral modification strategies: increasing lean protein intake, decreasing simple carbohydrates, increasing vegetables,  increasing water intake, decreasing eating out, no skipping meals, meal planning and cooking strategies, keeping healthy foods in the home, and planning for success.  Christian [name] has agreed to follow-up with our clinic in 4-6 weeks. He was informed of the importance of frequent follow-up visits to maximize his success with intensive lifestyle modifications for his multiple health conditions.   Objective:   Blood pressure 137/87, pulse (!) 58, temperature 97.7 F (36.5 C), height 5\' 11"  (1.803 m), weight 202 lb (91.6 kg), SpO2 99 %. Body mass index is 28.17 kg/m.  General: Cooperative, alert, well developed, in no acute distress. HEENT: Conjunctivae and lids unremarkable. Cardiovascular: Regular rhythm.  Lungs: Normal work of breathing. Neurologic: No focal deficits.   Lab Results  Component Value Date   CREATININE 0.97 09/28/2021   BUN 11 09/28/2021   NA 141 09/28/2021   K 5.0 09/28/2021   CL 102 09/28/2021   CO2 25 09/28/2021   Lab Results  Component Value Date   ALT 14 09/28/2021   AST 19 09/28/2021   ALKPHOS 81 09/28/2021   BILITOT 0.7 09/28/2021   Lab Results  Component Value Date   HGBA1C 5.3 09/28/2021   HGBA1C 5.4 06/24/2020   Lab Results  Component Value Date   INSULIN 5.1 09/28/2021   INSULIN 4.4 01/26/2021   INSULIN 6.8 10/05/2020   INSULIN 12.1 06/24/2020   Lab Results  Component Value Date   TSH 3.670 06/24/2020   Lab Results  Component Value Date   CHOL 117 09/28/2021   HDL 56 09/28/2021   LDLCALC 49 09/28/2021   TRIG 51 09/28/2021  CHOLHDL 2.4 06/15/2020   Lab Results  Component Value Date   VD25OH 58.9 09/28/2021   VD25OH 36.6 01/26/2021   VD25OH 55.0 10/05/2020   Lab Results  Component Value Date   WBC 6.0 03/30/2021   HGB 14.1 03/30/2021   HCT 41.3 03/30/2021   MCV 99.5 03/30/2021   PLT 140 (L) 03/30/2021   No results found for: IRON, TIBC, FERRITIN  Attestation Statements:   Reviewed by clinician on day of visit: allergies,  medications, problem list, medical history, surgical history, family history, social history, and previous encounter notes.  I, Lizbeth Bark, RMA, am acting as Location manager for [employer], DO.  I have reviewed the above documentation for accuracy and completeness, and I agree with the above. Jearld Lesch, DO

## 2022-01-17 ENCOUNTER — Encounter (INDEPENDENT_AMBULATORY_CARE_PROVIDER_SITE_OTHER): Payer: Self-pay | Admitting: Bariatrics

## 2022-02-07 ENCOUNTER — Ambulatory Visit (INDEPENDENT_AMBULATORY_CARE_PROVIDER_SITE_OTHER): Payer: Medicare Other | Admitting: Bariatrics

## 2022-02-11 DIAGNOSIS — Z20822 Contact with and (suspected) exposure to covid-19: Secondary | ICD-10-CM | POA: Diagnosis not present

## 2022-02-21 ENCOUNTER — Ambulatory Visit (INDEPENDENT_AMBULATORY_CARE_PROVIDER_SITE_OTHER): Payer: Medicare Other | Admitting: Bariatrics

## 2022-02-21 ENCOUNTER — Other Ambulatory Visit: Payer: Self-pay

## 2022-02-21 ENCOUNTER — Encounter (INDEPENDENT_AMBULATORY_CARE_PROVIDER_SITE_OTHER): Payer: Self-pay | Admitting: Bariatrics

## 2022-02-21 VITALS — BP 135/86 | HR 58 | Temp 97.5°F | Ht 71.0 in | Wt 202.0 lb

## 2022-02-21 DIAGNOSIS — Z6839 Body mass index (BMI) 39.0-39.9, adult: Secondary | ICD-10-CM

## 2022-02-21 DIAGNOSIS — E669 Obesity, unspecified: Secondary | ICD-10-CM

## 2022-02-21 DIAGNOSIS — Z6828 Body mass index (BMI) 28.0-28.9, adult: Secondary | ICD-10-CM

## 2022-02-21 DIAGNOSIS — I10 Essential (primary) hypertension: Secondary | ICD-10-CM | POA: Diagnosis not present

## 2022-02-21 DIAGNOSIS — E7849 Other hyperlipidemia: Secondary | ICD-10-CM

## 2022-02-21 NOTE — Progress Notes (Signed)
Chief Complaint:   OBESITY Christian [name] is here to discuss his progress with his obesity treatment plan along with follow-up of his obesity related diagnoses. Christian [name] is on the Category 1 Plan plus 100 calories and states he is following his eating plan approximately 95% of the time. Christian [name] states he is walking for 90 minutes 6-7 times per week.  Today's visit was #: 23 Starting weight: 278 lbs Starting date: 06/24/2020 Today's weight: 202 lbs Today's date: 02/21/2022 Total lbs lost to date: 76 lbs Total lbs lost since last in-office visit: 0  Interim History: Christian [name]'s weight remains the same. He is at his goal and doing well. It has not been hard to maintain.   Subjective:   1. Essential hypertension Christian [name]'s blood pressure is controlled. His last blood pressure was 137/87.  2. Other hyperlipidemia Christian [name] is taking Crestor currently.  Assessment/Plan:   1. Essential hypertension Christian [name] will continue taking his mediation. He is working on healthy weight loss and exercise to improve blood pressure control. We will watch for signs of hypotension as he continues his lifestyle modifications.  2. Other hyperlipidemia Cardiovascular risk and specific lipid/LDL goals reviewed.  Christian [name] will continue taking Crestor. We discussed several lifestyle modifications today and Christian [name] will continue to work on diet, exercise and weight loss efforts. Orders and follow up as documented in patient record.   Counseling Intensive lifestyle modifications are the first line treatment for this issue. Dietary changes: Increase soluble fiber. Decrease simple carbohydrates. Exercise changes: Moderate to vigorous-intensity aerobic activity 150 minutes per week if tolerated. Lipid-lowering medications: see documented in medical record.  3. Obesity, current BMI 28.3 Christian [name] is currently in the action stage of change. As such, his goal is to continue with weight loss efforts. He has agreed to the  Category 1 Plan plus 100 calories.   Christian [name] will adhere closely to the plan 80-90%. He will continue meal planning.  Exercise goals:  As is. Christian [name] will add weight training to his routine.   Behavioral modification strategies: increasing lean protein intake, decreasing simple carbohydrates, increasing vegetables, increasing water intake, decreasing eating out, no skipping meals, meal planning and cooking strategies, keeping healthy foods in the home, and planning for success.  Christian [name] has agreed to follow-up with our clinic in 6 weeks. He was informed of the importance of frequent follow-up visits to maximize his success with intensive lifestyle modifications for his multiple health conditions.   Objective:   Blood pressure 135/86, pulse (!) 58, temperature (!) 97.5 F (36.4 C), height 5\' 11"  (1.803 m), weight 202 lb (91.6 kg), SpO2 98 %. Body mass index is 28.17 kg/m.  General: Cooperative, alert, well developed, in no acute distress. HEENT: Conjunctivae and lids unremarkable. Cardiovascular: Regular rhythm.  Lungs: Normal work of breathing. Neurologic: No focal deficits.   Lab Results  Component Value Date   CREATININE 0.97 09/28/2021   BUN 11 09/28/2021   NA 141 09/28/2021   K 5.0 09/28/2021   CL 102 09/28/2021   CO2 25 09/28/2021   Lab Results  Component Value Date   ALT 14 09/28/2021   AST 19 09/28/2021   ALKPHOS 81 09/28/2021   BILITOT 0.7 09/28/2021   Lab Results  Component Value Date   HGBA1C 5.3 09/28/2021   HGBA1C 5.4 06/24/2020   Lab Results  Component Value Date   INSULIN 5.1 09/28/2021   INSULIN 4.4 01/26/2021   INSULIN 6.8 10/05/2020   INSULIN 12.1 06/24/2020   Lab Results  Component Value Date  TSH 3.670 06/24/2020   Lab Results  Component Value Date   CHOL 117 09/28/2021   HDL 56 09/28/2021   LDLCALC 49 09/28/2021   TRIG 51 09/28/2021   CHOLHDL 2.4 06/15/2020   Lab Results  Component Value Date   VD25OH 58.9 09/28/2021   VD25OH 36.6  01/26/2021   VD25OH 55.0 10/05/2020   Lab Results  Component Value Date   WBC 6.0 03/30/2021   HGB 14.1 03/30/2021   HCT 41.3 03/30/2021   MCV 99.5 03/30/2021   PLT 140 (L) 03/30/2021   No results found for: IRON, TIBC, FERRITIN  Attestation Statements:   Reviewed by clinician on day of visit: allergies, medications, problem list, medical history, surgical history, family history, social history, and previous encounter notes.  I, Christian [name], RMA, am acting as Location manager for [employer], DO.  I have reviewed the above documentation for accuracy and completeness, and I agree with the above. Christian Lesch, DO

## 2022-02-22 DIAGNOSIS — L821 Other seborrheic keratosis: Secondary | ICD-10-CM | POA: Diagnosis not present

## 2022-02-22 DIAGNOSIS — L918 Other hypertrophic disorders of the skin: Secondary | ICD-10-CM | POA: Diagnosis not present

## 2022-02-22 DIAGNOSIS — Z85828 Personal history of other malignant neoplasm of skin: Secondary | ICD-10-CM | POA: Diagnosis not present

## 2022-02-22 DIAGNOSIS — D225 Melanocytic nevi of trunk: Secondary | ICD-10-CM | POA: Diagnosis not present

## 2022-02-22 DIAGNOSIS — L578 Other skin changes due to chronic exposure to nonionizing radiation: Secondary | ICD-10-CM | POA: Diagnosis not present

## 2022-02-22 DIAGNOSIS — L570 Actinic keratosis: Secondary | ICD-10-CM | POA: Diagnosis not present

## 2022-02-22 DIAGNOSIS — D485 Neoplasm of uncertain behavior of skin: Secondary | ICD-10-CM | POA: Diagnosis not present

## 2022-04-04 ENCOUNTER — Encounter (INDEPENDENT_AMBULATORY_CARE_PROVIDER_SITE_OTHER): Payer: Self-pay | Admitting: Bariatrics

## 2022-04-04 ENCOUNTER — Ambulatory Visit (INDEPENDENT_AMBULATORY_CARE_PROVIDER_SITE_OTHER): Payer: Medicare Other | Admitting: Bariatrics

## 2022-04-04 VITALS — BP 135/88 | HR 66 | Temp 97.9°F | Ht 71.0 in | Wt 206.0 lb

## 2022-04-04 DIAGNOSIS — E8881 Metabolic syndrome: Secondary | ICD-10-CM

## 2022-04-04 DIAGNOSIS — E7849 Other hyperlipidemia: Secondary | ICD-10-CM | POA: Diagnosis not present

## 2022-04-04 DIAGNOSIS — Z8639 Personal history of other endocrine, nutritional and metabolic disease: Secondary | ICD-10-CM | POA: Diagnosis not present

## 2022-04-04 DIAGNOSIS — Z6831 Body mass index (BMI) 31.0-31.9, adult: Secondary | ICD-10-CM | POA: Diagnosis not present

## 2022-04-04 DIAGNOSIS — E669 Obesity, unspecified: Secondary | ICD-10-CM | POA: Diagnosis not present

## 2022-04-05 NOTE — Progress Notes (Signed)
? ? ? ?Chief Complaint:  ? ?OBESITY ?Christian [name] is here to discuss his progress with his obesity treatment plan along with follow-up of his obesity related diagnoses. Christian [name] is on the Category 1 Plan plus 100 calories and states he is following his eating plan approximately 90-95% of the time. Christian [name] states he is walkig for 60 minutes 6 times per week. ? ?Today's visit was #: 24 ?Starting weight: 278 lbs ?Starting date: 06/24/2020 ?Today's weight: 206 lbs ?Today's date: 04/04/2022 ?Total lbs lost to date: 72 lbs ?Total lbs lost since last in-office visit: 0 ? ?Interim History: Christian [name] is up 4 lbs but has done exceptionally well overall. He has not had a water pill and salty food.  ? ?Subjective:  ? ?1. Other hyperlipidemia ?Christian [name] is currently taking Crestor.  ? ?2. Insulin resistance ?Christian [name] is not on medications currently.  ? ?Assessment/Plan:  ? ?1. Other hyperlipidemia ?Cardiovascular risk and specific lipid/LDL goals reviewed.  Christian [name] will continue Crestor. We discussed several lifestyle modifications today and Christian [name] will continue to work on diet, exercise and weight loss efforts. Orders and follow up as documented in patient record.  ? ?Counseling ?Intensive lifestyle modifications are the first line treatment for this issue. ?Dietary changes: Increase soluble fiber. Decrease simple carbohydrates. ?Exercise changes: Moderate to vigorous-intensity aerobic activity 150 minutes per week if tolerated. ?Lipid-lowering medications: see documented in medical record. ? ?2. Insulin resistance ?Christian [name] will minimize his sweets and starches. He will continue to work on weight loss, exercise, and decreasing simple carbohydrates to help decrease the risk of diabetes. Christian [name] agreed to follow-up with [name] as directed to closely monitor his progress. ? ?3. Obesity, current BMI 31.8 ?Christian [name] is currently in the action stage of change. As such, his goal is to continue with weight loss efforts. He has agreed to the Category 1  Plan plus 100 calories.   ? ?Christian [name] will continue meal planning. He is now back on his Lasix. He will have no added salt.  ? ?Exercise goals:  As is. Christian [name] will start back swimming next week.  ? ?Behavioral modification strategies: increasing lean protein intake, decreasing simple carbohydrates, increasing vegetables, increasing water intake, decreasing eating out, no skipping meals, meal planning and cooking strategies, keeping healthy foods in the home, and planning for success. ? ?Christian [name] has agreed to follow-up with our clinic in 4 weeks with nurse practitioner and 8 weeks with myself. He was informed of the importance of frequent follow-up visits to maximize his success with intensive lifestyle modifications for his multiple health conditions.  ? ?Objective:  ? ?Blood pressure 135/88, pulse 66, temperature 97.9 ?F (36.6 ?C), height '5\' 11"'$  (1.803 m), weight 206 lb (93.4 kg), SpO2 96 %. ?Body mass index is 28.73 kg/m?. ? ?General: Cooperative, alert, well developed, in no acute distress. ?HEENT: Conjunctivae and lids unremarkable. ?Cardiovascular: Regular rhythm.  ?Lungs: Normal work of breathing. ?Neurologic: No focal deficits.  ? ?Lab Results  ?Component Value Date  ? CREATININE 0.97 09/28/2021  ? BUN 11 09/28/2021  ? NA 141 09/28/2021  ? K 5.0 09/28/2021  ? CL 102 09/28/2021  ? CO2 25 09/28/2021  ? ?Lab Results  ?Component Value Date  ? ALT 14 09/28/2021  ? AST 19 09/28/2021  ? ALKPHOS 81 09/28/2021  ? BILITOT 0.7 09/28/2021  ? ?Lab Results  ?Component Value Date  ? HGBA1C 5.3 09/28/2021  ? HGBA1C 5.4 06/24/2020  ? ?Lab Results  ?Component Value Date  ? INSULIN 5.1 09/28/2021  ? INSULIN 4.4 01/26/2021  ?  INSULIN 6.8 10/05/2020  ? INSULIN 12.1 06/24/2020  ? ?Lab Results  ?Component Value Date  ? TSH 3.670 06/24/2020  ? ?Lab Results  ?Component Value Date  ? CHOL 117 09/28/2021  ? HDL 56 09/28/2021  ? LDLCALC 49 09/28/2021  ? TRIG 51 09/28/2021  ? CHOLHDL 2.4 06/15/2020  ? ?Lab Results  ?Component Value Date   ? VD25OH 58.9 09/28/2021  ? VD25OH 36.6 01/26/2021  ? VD25OH 55.0 10/05/2020  ? ?Lab Results  ?Component Value Date  ? WBC 6.0 03/30/2021  ? HGB 14.1 03/30/2021  ? HCT 41.3 03/30/2021  ? MCV 99.5 03/30/2021  ? PLT 140 (L) 03/30/2021  ? ?No results found for: IRON, TIBC, FERRITIN ? ?Attestation Statements:  ? ?Reviewed by clinician on day of visit: allergies, medications, problem list, medical history, surgical history, family history, social history, and previous encounter notes. ? ?I, Lizbeth Bark, RMA, am acting as transcriptionist for [employer], DO. ? ?I have reviewed the above documentation for accuracy and completeness, and I agree with the above. Jearld Lesch, DO ? ?

## 2022-04-06 DIAGNOSIS — Z8639 Personal history of other endocrine, nutritional and metabolic disease: Secondary | ICD-10-CM | POA: Insufficient documentation

## 2022-04-06 DIAGNOSIS — L821 Other seborrheic keratosis: Secondary | ICD-10-CM | POA: Insufficient documentation

## 2022-04-06 DIAGNOSIS — D225 Melanocytic nevi of trunk: Secondary | ICD-10-CM | POA: Insufficient documentation

## 2022-04-06 DIAGNOSIS — Z85828 Personal history of other malignant neoplasm of skin: Secondary | ICD-10-CM | POA: Insufficient documentation

## 2022-04-11 DIAGNOSIS — Z20822 Contact with and (suspected) exposure to covid-19: Secondary | ICD-10-CM | POA: Diagnosis not present

## 2022-05-03 ENCOUNTER — Ambulatory Visit (INDEPENDENT_AMBULATORY_CARE_PROVIDER_SITE_OTHER): Payer: Medicare Other | Admitting: Physician Assistant

## 2022-05-10 DIAGNOSIS — K429 Umbilical hernia without obstruction or gangrene: Secondary | ICD-10-CM | POA: Diagnosis not present

## 2022-05-11 ENCOUNTER — Ambulatory Visit (INDEPENDENT_AMBULATORY_CARE_PROVIDER_SITE_OTHER): Payer: Medicare Other | Admitting: Family Medicine

## 2022-05-11 ENCOUNTER — Encounter (INDEPENDENT_AMBULATORY_CARE_PROVIDER_SITE_OTHER): Payer: Self-pay | Admitting: Family Medicine

## 2022-05-11 VITALS — BP 131/81 | HR 82 | Temp 97.9°F | Ht 71.0 in | Wt 203.0 lb

## 2022-05-11 DIAGNOSIS — Z6828 Body mass index (BMI) 28.0-28.9, adult: Secondary | ICD-10-CM | POA: Diagnosis not present

## 2022-05-11 DIAGNOSIS — E669 Obesity, unspecified: Secondary | ICD-10-CM | POA: Diagnosis not present

## 2022-05-11 DIAGNOSIS — M6284 Sarcopenia: Secondary | ICD-10-CM

## 2022-05-25 NOTE — Progress Notes (Signed)
Chief Complaint:   OBESITY Chevez is here to discuss his progress with his obesity treatment plan along with follow-up of his obesity related diagnoses. Brandt is on the Category 1 Plan + 100 calories and states he is following his eating plan approximately 95% of the time. Geron states he is walking 2 miles 7 times per week.    Today's visit was #: 25 Starting weight: 278 lbs Starting date: 06/24/2020 Today's weight: 203 lbs Today's date: 05/11/2022 Total lbs lost to date: 75 Total lbs lost since last in-office visit: 3  Interim History: Vaishnav continues to do well with weight loss. He is exercising most days, but his muscle mass is below goal.   Subjective:   1. Sarcopenia Keo's muscle mass is below goal. He is exercising and trying to eat adequate protein but he is not doing much strengthening.   Assessment/Plan:   1. Sarcopenia Juniper was encouraged to add ankle weights while walking or use pool weights while swimming to increase muscle mass. He will continue to meet his protein goals.   2. Obesity, Current BMI 28.3 Taiden is currently in the action stage of change. As such, his goal is to continue with weight loss efforts. He has agreed to the Category 1 Plan.   We will recheck fasting labs at his next visit.  Exercise goals: As is, but add strengthening exercise.  Behavioral modification strategies: increasing lean protein intake.  Keilon has agreed to follow-up with our clinic in 3 weeks. He was informed of the importance of frequent follow-up visits to maximize his success with intensive lifestyle modifications for his multiple health conditions.   Objective:   Blood pressure 131/81, pulse 82, temperature 97.9 F (36.6 C), height '5\' 11"'$  (1.803 m), weight 203 lb (92.1 kg), SpO2 97 %. Body mass index is 28.31 kg/m.  General: Cooperative, alert, well developed, in no acute distress. HEENT: Conjunctivae and lids unremarkable. Cardiovascular: Regular  rhythm.  Lungs: Normal work of breathing. Neurologic: No focal deficits.   Lab Results  Component Value Date   CREATININE 0.97 09/28/2021   BUN 11 09/28/2021   NA 141 09/28/2021   K 5.0 09/28/2021   CL 102 09/28/2021   CO2 25 09/28/2021   Lab Results  Component Value Date   ALT 14 09/28/2021   AST 19 09/28/2021   ALKPHOS 81 09/28/2021   BILITOT 0.7 09/28/2021   Lab Results  Component Value Date   HGBA1C 5.3 09/28/2021   HGBA1C 5.4 06/24/2020   Lab Results  Component Value Date   INSULIN 5.1 09/28/2021   INSULIN 4.4 01/26/2021   INSULIN 6.8 10/05/2020   INSULIN 12.1 06/24/2020   Lab Results  Component Value Date   TSH 3.670 06/24/2020   Lab Results  Component Value Date   CHOL 117 09/28/2021   HDL 56 09/28/2021   LDLCALC 49 09/28/2021   TRIG 51 09/28/2021   CHOLHDL 2.4 06/15/2020   Lab Results  Component Value Date   VD25OH 58.9 09/28/2021   VD25OH 36.6 01/26/2021   VD25OH 55.0 10/05/2020   Lab Results  Component Value Date   WBC 6.0 03/30/2021   HGB 14.1 03/30/2021   HCT 41.3 03/30/2021   MCV 99.5 03/30/2021   PLT 140 (L) 03/30/2021   No results found for: IRON, TIBC, FERRITIN  Attestation Statements:   Reviewed by clinician on day of visit: allergies, medications, problem list, medical history, surgical history, family history, social history, and previous encounter notes.  Time spent  on visit including pre-visit chart review and post-visit care and charting was 22 minutes.   I, Trixie Dredge, am acting as transcriptionist for Dennard Nip, MD.  I have reviewed the above documentation for accuracy and completeness, and I agree with the above. -  Dennard Nip, MD

## 2022-05-30 ENCOUNTER — Ambulatory Visit (INDEPENDENT_AMBULATORY_CARE_PROVIDER_SITE_OTHER): Payer: Medicare Other | Admitting: Bariatrics

## 2022-05-30 DIAGNOSIS — K429 Umbilical hernia without obstruction or gangrene: Secondary | ICD-10-CM | POA: Diagnosis not present

## 2022-06-01 ENCOUNTER — Encounter (INDEPENDENT_AMBULATORY_CARE_PROVIDER_SITE_OTHER): Payer: Self-pay | Admitting: Bariatrics

## 2022-06-06 ENCOUNTER — Ambulatory Visit (INDEPENDENT_AMBULATORY_CARE_PROVIDER_SITE_OTHER): Payer: Medicare Other | Admitting: Bariatrics

## 2022-06-06 ENCOUNTER — Telehealth: Payer: Self-pay | Admitting: Cardiology

## 2022-06-06 ENCOUNTER — Ambulatory Visit (INDEPENDENT_AMBULATORY_CARE_PROVIDER_SITE_OTHER): Payer: Medicare Other | Admitting: Nurse Practitioner

## 2022-06-06 ENCOUNTER — Encounter: Payer: Self-pay | Admitting: Nurse Practitioner

## 2022-06-06 ENCOUNTER — Encounter (INDEPENDENT_AMBULATORY_CARE_PROVIDER_SITE_OTHER): Payer: Self-pay | Admitting: Bariatrics

## 2022-06-06 ENCOUNTER — Telehealth: Payer: Self-pay | Admitting: *Deleted

## 2022-06-06 VITALS — BP 134/78 | HR 62 | Temp 97.9°F | Ht 71.0 in | Wt 204.0 lb

## 2022-06-06 DIAGNOSIS — I5032 Chronic diastolic (congestive) heart failure: Secondary | ICD-10-CM

## 2022-06-06 DIAGNOSIS — Z6828 Body mass index (BMI) 28.0-28.9, adult: Secondary | ICD-10-CM | POA: Diagnosis not present

## 2022-06-06 DIAGNOSIS — I10 Essential (primary) hypertension: Secondary | ICD-10-CM

## 2022-06-06 DIAGNOSIS — E669 Obesity, unspecified: Secondary | ICD-10-CM | POA: Diagnosis not present

## 2022-06-06 DIAGNOSIS — Z01810 Encounter for preprocedural cardiovascular examination: Secondary | ICD-10-CM

## 2022-06-06 DIAGNOSIS — I110 Hypertensive heart disease with heart failure: Secondary | ICD-10-CM

## 2022-06-06 NOTE — Progress Notes (Signed)
Virtual Visit via Telephone Note   Because of Christian [name]'s co-morbid illnesses, he is at least at moderate risk for complications without adequate follow up.  This format is felt to be most appropriate for this patient at this time.  The patient did not have access to video technology/had technical difficulties with video requiring transitioning to audio format only (telephone).  All issues noted in this document were discussed and addressed.  No physical exam could be performed with this format.  Please refer to the patient's chart for his consent to telehealth for [facility].  Evaluation Performed:  Preoperative cardiovascular risk assessment _____________   Date:  06/06/2022   Patient ID:  Christian [name], DOB 1946/12/22, MRN [id number] Patient Location:  Home Provider location:   Office  Primary Care Provider:  Alroy Dust, L.Marlou Sa, MD Primary Cardiologist:  Fransico Him, MD  Chief Complaint / Patient Profile   76 y.o. y/o male with a h/o hypertension as well as orthostatic hypotension, mild to moderate nonobstructive CAD, hyperlipidemia, PVCs who is pending laparoscopic umbilical hernia repair with mesh and presents today for telephonic preoperative cardiovascular risk assessment.  Past Medical History    Past Medical History:  Diagnosis Date   Arthritis    "joints; shoulders; left elbow; right thumb" (05/06/2014)   Atrial tachycardia (HCC)    Breathing problem    Cataracts, bilateral    immature   Chest pain    Constipation    takes Colace and Metamucil daily   Coronary artery disease 02/2011   cath 11/2016 showing 20% RCA, 50% OM, 35% mid LCx, 70% D1, 65% mid LAD, 70% distal LAD 80% on medical management   Diastolic dysfunction    GERD (gastroesophageal reflux disease)    Heart murmur    Hemorrhoids    History of bronchitis    Hypercholesterolemia    takes Crestor daily   Hypertension    Joint pain    Joint pain    Nocturia    Orthostatic hypotension     Palpitations    Premature atrial contractions    PVC's (premature ventricular contractions)    Restless leg syndrome    Rheumatoid arthritis ([place])    Sciatica    Sinus bradycardia    Past Surgical History:  Procedure Laterality Date   ACHILLES TENDON REPAIR Left    "& fixed a spur"   CARDIAC CATHETERIZATION  2012   CARDIAC CATHETERIZATION N/A 12/06/2016   Procedure: Left Heart Cath and Coronary Angiography;  Surgeon: Belva Crome, MD;  Location: [place] [facility];  Service: Cardiovascular;  Laterality: N/A;   COLONOSCOPY     FLEXIBLE SIGMOIDOSCOPY  X 2   HAND SURGERY Left    "thumb; cleaned out arthritis"   HEEL SPUR EXCISION Left    KNEE ARTHROSCOPY Right    LUMBAR LAMINECTOMY/DECOMPRESSION MICRODISCECTOMY N/A 03/30/2021   Procedure: LEFT L3 HEMILAMINECTOMY WITH EXCISION OF DISC HERNIATION;  Surgeon: Jessy Oto, MD;  Location: [place];  Service: Orthopedics;  Laterality: N/A;   SHOULDER SURGERY Right X 2   "cleaned out spurs"   TOE FUSION Right    great toe; plates and screws   TONSILLECTOMY  ~ [place] Right 09/22/2015   Procedure: TOTAL HIP ARTHROPLASTY;  Surgeon: Garald Balding, MD;  Location: [place];  Service: Orthopedics;  Laterality: Right;   TOTAL KNEE ARTHROPLASTY Right 05/06/2014   TOTAL KNEE ARTHROPLASTY Right 05/06/2014   Procedure: RIGHT TOTAL KNEE ARTHROPLASTY;  Surgeon: Collier Salina  Sharlotte Alamo, MD;  Location: [facility];  Service: Orthopedics;  Laterality: Right;   TOTAL KNEE ARTHROPLASTY Left 02/05/2019   Procedure: LEFT TOTAL KNEE ARTHROPLASTY;  Surgeon: Garald Balding, MD;  Location: [place];  Service: Orthopedics;  Laterality: Left;    Allergies  Allergies  Allergen Reactions   Atorvastatin Calcium [Atorvastatin] Other (See Comments)    Myalgia   Simvastatin Other (See Comments)    Myalgia   Pravastatin Other (See Comments)    Leg Cramps    History of Present Illness    Christian [name] is a 76 y.o. male who presents via audio/video  conferencing for a telehealth visit today.  Pt was last seen in cardiology clinic on 07/02/2021 by [name].  At that time Christian [name] was doing well.  The patient is now pending procedure as outlined above. Since his last visit, he reports he is walking 2 miles daily and swims for approximately 1 hour per day.  He denies chest pain, shortness of breath, lower extremity edema, fatigue, palpitations, melena, hematuria, hemoptysis, diaphoresis, weakness, presyncope, syncope, orthopnea, and PND.   Home Medications    Prior to Admission medications   Medication Sig Start Date End Date Taking? Authorizing Provider  acetaminophen (TYLENOL) 500 MG tablet Take 1,000 mg by mouth every 8 (eight) hours as needed for moderate pain.    [provider]  aspirin 81 MG tablet Take 81 mg by mouth daily.    [provider]  cholecalciferol (VITAMIN D3) 25 MCG (1000 UNIT) tablet Take 1,000 Units by mouth daily.    [provider]  COVID-19 mRNA bivalent vaccine, Pfizer, (PFIZER COVID-19 VAC BIVALENT) injection Inject into the muscle. 10/13/21   Carlyle Basques, MD  docusate sodium (COLACE) 100 MG capsule Take 100 mg by mouth 3 (three) times daily.    [provider]  furosemide (LASIX) 20 MG tablet TAKE 1 TABLET BY MOUTH EVERY MON, WED, FRI 07/19/21   Sueanne Margarita, MD  influenza vaccine adjuvanted (FLUAD) 0.5 ML injection Inject into the muscle. 10/13/21   Carlyle Basques, MD  isosorbide mononitrate (IMDUR) 60 MG 24 hr tablet Take 1 tablet (60 mg total) by mouth daily. 07/02/21   Sueanne Margarita, MD  rosuvastatin (CRESTOR) 20 MG tablet Take 1 tablet (20 mg total) by mouth daily. 07/02/21   Sueanne Margarita, MD    Physical Exam    Vital Signs:  Christian [name] does not have vital signs available for review today.  Given telephonic nature of communication, physical exam is limited. AAOx3. NAD. Normal affect.  Speech and respirations are unlabored.  Accessory Clinical  Findings    None  Assessment & Plan    1.  Preoperative Cardiovascular Risk Assessment: He is doing well from a cardiac perspective and may proceed to surgery without further testing. According to the Revised Cardiac Risk Index (RCRI), his Perioperative Risk of Major Cardiac Event is (%): 0.4. His Functional Capacity in METs is: 7.59 according to the Duke Activity Status Index (DASI).   A copy of this note will be routed to requesting surgeon.  Time:   Today, I have spent 10 minutes with the patient with telehealth technology discussing medical history, symptoms, and management plan.     Emmaline Life, NP-C    06/06/2022, 1:23 PM [place] [address], [address] Office [phone] Fax [phone]

## 2022-06-06 NOTE — Telephone Encounter (Signed)
Surgery is scheduled for tomorrow. He has not been seen since 06/2021. I am willing to do a preop call today but if there are any concerns, we will have to bring him in the office.  Emmaline Life, NP-C    06/06/2022, 11:12 AM [place] [address] Office [phone] Fax [phone]

## 2022-06-06 NOTE — Telephone Encounter (Signed)
I s/w the pt and stated our office was just made aware today that he has a procedure tomorrow 06/07/22 with Dr. Rosendo Gros. I informed the pt that he is going to need an appt for pre op clearance, normally we would have him come in the office since we saw him last 06/2021. I stated the pre op provider said to add him onto the afternoon tele pre op appt for pre op assessment. Per the pre op provider if it is felt the pt still needs to be seen in the office we will schedule that appt. Med rec and consent are done.

## 2022-06-06 NOTE — Telephone Encounter (Signed)
I s/w the pt and stated our office was just made aware today that he has a procedure tomorrow 06/07/22 with Dr. Rosendo Gros. I informed the pt that he is going to need an appt for pre op clearance, normally we would have him come in the office since we saw him last 06/2021. I stated the pre op provider said to add him onto the afternoon tele pre op appt for pre op assessment. Per the pre op provider if it is felt the pt still needs to be seen in the office we will schedule that appt. Med rec and consent are done.    Patient Consent for Virtual Visit        Christian [name] has provided verbal consent on 06/06/2022 for a virtual visit (video or telephone).   CONSENT FOR VIRTUAL VISIT FOR:  Christian [name]  By participating in this virtual visit I agree to the following:  I hereby voluntarily request, consent and authorize [facility] and its employed or contracted physicians, physician assistants, nurse practitioners or other licensed health care professionals (the Practitioner), to provide me with telemedicine health care services (the "Services") as deemed necessary by the treating Practitioner. I acknowledge and consent to receive the Services by the Practitioner via telemedicine. I understand that the telemedicine visit will involve communicating with the Practitioner through live audiovisual communication technology and the disclosure of certain medical information by electronic transmission. I acknowledge that I have been given the opportunity to request an in-person assessment or other available alternative prior to the telemedicine visit and am voluntarily participating in the telemedicine visit.  I understand that I have the right to withhold or withdraw my consent to the use of telemedicine in the course of my care at any time, without affecting my right to future care or treatment, and that the Practitioner or I may terminate the telemedicine visit at any time. I understand that I have the  right to inspect all information obtained and/or recorded in the course of the telemedicine visit and may receive copies of available information for a reasonable fee.  I understand that some of the potential risks of receiving the Services via telemedicine include:  Delay or interruption in medical evaluation due to technological equipment failure or disruption; Information transmitted may not be sufficient (e.g. poor resolution of images) to allow for appropriate medical decision making by the Practitioner; and/or  In rare instances, security protocols could fail, causing a breach of personal health information.  Furthermore, I acknowledge that it is my responsibility to provide information about my medical history, conditions and care that is complete and accurate to the best of my ability. I acknowledge that Practitioner's advice, recommendations, and/or decision may be based on factors not within their control, such as incomplete or inaccurate data provided by me or distortions of diagnostic images or specimens that may result from electronic transmissions. I understand that the practice of medicine is not an exact science and that Practitioner makes no warranties or guarantees regarding treatment outcomes. I acknowledge that a copy of this consent can be made available to me via my patient portal ([facility]), or I can request a printed copy by calling the office of [facility].    I understand that my insurance will be billed for this visit.   I have read or had this consent read to me. I understand the contents of this consent, which adequately explains the benefits and risks of the Services being provided via telemedicine.  I have been provided ample opportunity to ask questions regarding this consent and the Services and have had my questions answered to my satisfaction. I give my informed consent for the services to be provided through the use of telemedicine in my medical  care

## 2022-06-06 NOTE — Telephone Encounter (Signed)
   Pre-operative Risk Assessment    Patient Name: Christian [name]  DOB: 1946-11-16 MRN: [id number]      Request for Surgical Clearance    Procedure:   Laparoscopic umbilical hernia repair  with mesh  Date of Surgery:  Clearance 06/07/22                                 Surgeon:  Dr. Barry Brunner Group or Practice Name:  [facility] Phone number:  [phone] Fax number:  [phone]   Type of Clearance Requested:   - Medical  They said telephone clearance should be enough.  If telephone clearance is done surgical center needs to be called.     Type of Anesthesia:  General    Additional requests/questions:      Marquita Palms   06/06/2022, 9:32 AM

## 2022-06-07 DIAGNOSIS — K429 Umbilical hernia without obstruction or gangrene: Secondary | ICD-10-CM | POA: Diagnosis not present

## 2022-06-07 NOTE — Progress Notes (Unsigned)
Chief Complaint:   OBESITY Christian [name] is here to discuss his progress with his obesity treatment plan along with follow-up of his obesity related diagnoses. Christian [name] is on the Category 1 Plan and states he is following his eating plan approximately 95% of the time. Christian [name] states he is walking and swimming for 60 minutes 6 times per week.  Today's visit was #: 36 Starting weight: 278 lbs Starting date: 06/24/2020 Today's weight: 204 lbs Today's date: 06/06/2022 Total lbs lost to date: 74 lbs Total lbs lost since last in-office visit: 0  Interim History: Christian [name] is up 1 lb but has stabilized well. His bioimpedance shows his water is up about 2 lbs. He occasionally eat extra carbohydrates.   Subjective:   1. Chronic diastolic heart failure (HCC) Christian [name] is taking Lasix, Imdur, and Crestor currently.   2. Primary hypertension Christian [name]'s blood pressure is controlled. His blood pressure today was 134/78. See medication list.   Assessment/Plan:   1. Chronic diastolic heart failure (HCC) Christian [name] will continue his medications. He will follow up with his cardiologist. Disease process and medications reviewed with an emphasis on salt restriction, regular exercise, and regular weight monitoring.    2. Primary hypertension Christian [name]'s primary care physician will continue to follow. He will have no added salt. He is working on healthy weight loss and exercise to improve blood pressure control. We will watch for signs of hypotension as he continues his lifestyle modifications.  3. Obesity, Current BMI 28.5 Christian [name] is currently in the action stage of change. As such, his goal is to continue with weight loss efforts. He has agreed to the Category 1 Plan.   Christian [name] will continue to adhere closely to the plan,. Eating Out Guide was provided today.   Exercise goals:  As is. Christian [name] will exercise, do weight training, and walk the dog.    Behavioral modification strategies: increasing lean protein  intake, decreasing simple carbohydrates, increasing vegetables, increasing water intake, decreasing eating out, no skipping meals, meal planning and cooking strategies, keeping healthy foods in the home, and planning for success.  Christian [name] has agreed to follow-up with our clinic in 4-6 weeks. He was informed of the importance of frequent follow-up visits to maximize his success with intensive lifestyle modifications for his multiple health conditions.   Objective:   Blood pressure 134/78, pulse 62, temperature 97.9 F (36.6 C), height '5\' 11"'$  (1.803 m), weight 204 lb (92.5 kg), SpO2 97 %. Body mass index is 28.45 kg/m.  General: Cooperative, alert, well developed, in no acute distress. HEENT: Conjunctivae and lids unremarkable. Cardiovascular: Regular rhythm.  Lungs: Normal work of breathing. Neurologic: No focal deficits.   Lab Results  Component Value Date   CREATININE 0.97 09/28/2021   BUN 11 09/28/2021   NA 141 09/28/2021   K 5.0 09/28/2021   CL 102 09/28/2021   CO2 25 09/28/2021   Lab Results  Component Value Date   ALT 14 09/28/2021   AST 19 09/28/2021   ALKPHOS 81 09/28/2021   BILITOT 0.7 09/28/2021   Lab Results  Component Value Date   HGBA1C 5.3 09/28/2021   HGBA1C 5.4 06/24/2020   Lab Results  Component Value Date   INSULIN 5.1 09/28/2021   INSULIN 4.4 01/26/2021   INSULIN 6.8 10/05/2020   INSULIN 12.1 06/24/2020   Lab Results  Component Value Date   TSH 3.670 06/24/2020   Lab Results  Component Value Date   CHOL 117 09/28/2021   HDL 56 09/28/2021   [place]  49 09/28/2021   TRIG 51 09/28/2021   CHOLHDL 2.4 06/15/2020   Lab Results  Component Value Date   VD25OH 58.9 09/28/2021   VD25OH 36.6 01/26/2021   VD25OH 55.0 10/05/2020   Lab Results  Component Value Date   WBC 6.0 03/30/2021   HGB 14.1 03/30/2021   HCT 41.3 03/30/2021   MCV 99.5 03/30/2021   PLT 140 (L) 03/30/2021   No results found for: "IRON", "TIBC", "FERRITIN"  Attestation  Statements:   Reviewed by clinician on day of visit: allergies, medications, problem list, medical history, surgical history, family history, social history, and previous encounter notes.  I, Lizbeth Bark, RMA, am acting as Location manager for [employer], DO.  I have reviewed the above documentation for accuracy and completeness, and I agree with the above. Jearld Lesch, DO

## 2022-06-08 ENCOUNTER — Encounter (INDEPENDENT_AMBULATORY_CARE_PROVIDER_SITE_OTHER): Payer: Self-pay | Admitting: Bariatrics

## 2022-07-18 ENCOUNTER — Ambulatory Visit (INDEPENDENT_AMBULATORY_CARE_PROVIDER_SITE_OTHER): Payer: Medicare Other | Admitting: Bariatrics

## 2022-07-18 ENCOUNTER — Encounter (INDEPENDENT_AMBULATORY_CARE_PROVIDER_SITE_OTHER): Payer: Self-pay | Admitting: Bariatrics

## 2022-07-18 VITALS — BP 132/82 | HR 57 | Temp 97.5°F | Ht 71.0 in | Wt 208.0 lb

## 2022-07-18 DIAGNOSIS — Z6829 Body mass index (BMI) 29.0-29.9, adult: Secondary | ICD-10-CM

## 2022-07-18 DIAGNOSIS — R7309 Other abnormal glucose: Secondary | ICD-10-CM | POA: Diagnosis not present

## 2022-07-18 DIAGNOSIS — E559 Vitamin D deficiency, unspecified: Secondary | ICD-10-CM | POA: Diagnosis not present

## 2022-07-18 DIAGNOSIS — E669 Obesity, unspecified: Secondary | ICD-10-CM

## 2022-07-18 DIAGNOSIS — E7849 Other hyperlipidemia: Secondary | ICD-10-CM

## 2022-07-18 DIAGNOSIS — E8881 Metabolic syndrome: Secondary | ICD-10-CM | POA: Diagnosis not present

## 2022-07-18 DIAGNOSIS — I10 Essential (primary) hypertension: Secondary | ICD-10-CM | POA: Diagnosis not present

## 2022-07-19 LAB — HEMOGLOBIN A1C
Est. average glucose Bld gHb Est-mCnc: 100 mg/dL
Hgb A1c MFr Bld: 5.1 % (ref 4.8–5.6)

## 2022-07-19 LAB — COMPREHENSIVE METABOLIC PANEL
ALT: 10 IU/L (ref 0–44)
AST: 16 IU/L (ref 0–40)
Albumin/Globulin Ratio: 1.6 (ref 1.2–2.2)
Albumin: 3.9 g/dL (ref 3.8–4.8)
Alkaline Phosphatase: 67 IU/L (ref 44–121)
BUN/Creatinine Ratio: 11 (ref 10–24)
BUN: 12 mg/dL (ref 8–27)
Bilirubin Total: 0.6 mg/dL (ref 0.0–1.2)
CO2: 25 mmol/L (ref 20–29)
Calcium: 9.1 mg/dL (ref 8.6–10.2)
Chloride: 104 mmol/L (ref 96–106)
Creatinine, Ser: 1.05 mg/dL (ref 0.76–1.27)
Globulin, Total: 2.5 g/dL (ref 1.5–4.5)
Glucose: 77 mg/dL (ref 70–99)
Potassium: 4.8 mmol/L (ref 3.5–5.2)
Sodium: 142 mmol/L (ref 134–144)
Total Protein: 6.4 g/dL (ref 6.0–8.5)
eGFR: 74 mL/min/{1.73_m2} (ref >59)

## 2022-07-19 LAB — LIPID PANEL WITH LDL/HDL RATIO
Cholesterol, Total: 122 mg/dL (ref 100–199)
HDL: 62 mg/dL (ref >39)
LDL Chol Calc (NIH): 48 mg/dL (ref 0–99)
LDL/HDL Ratio: 0.8 ratio (ref 0.0–3.6)
Triglycerides: 53 mg/dL (ref 0–149)
VLDL Cholesterol Cal: 12 mg/dL (ref 5–40)

## 2022-07-19 LAB — VITAMIN D 25 HYDROXY (VIT D DEFICIENCY, FRACTURES): Vit D, 25-Hydroxy: 59.4 ng/mL (ref 30.0–100.0)

## 2022-07-19 LAB — INSULIN, RANDOM: INSULIN: 5.4 u[IU]/mL (ref 2.6–24.9)

## 2022-07-25 NOTE — Progress Notes (Unsigned)
Chief Complaint:   OBESITY Christian [name] is here to discuss his progress with his obesity treatment plan along with follow-up of his obesity related diagnoses. Christian [name] is on the Category 1 Plan and states he is following his eating plan approximately 90% of the time. Christian [name] states he is walking for 60 minutes 6 times per week.  Today's visit was #: 40 Starting weight: 278 lbs Starting date: 06/24/2020 Today's weight: 208 lbs Today's date: 07/18/2022 Total lbs lost to date: 70 Total lbs lost since last in-office visit: 0  Interim History: Dream had bilateral hernia surgery since his last visit.  He had to recover from surgery.  He is getting adequate protein and water in.  Subjective:   1. Other hyperlipidemia Christian [name] is taking Crestor.  2. Primary hypertension Christian [name]'s blood pressure is controlled.  3. Vitamin D deficiency Christian [name] is taking vitamin D.  4. Insulin resistance Christian [name] is not on medications currently.  Assessment/Plan:   1. Other hyperlipidemia We will check labs today, Christian [name] will continue his medications and we will follow-up at his next office visit.  - Lipid Panel With LDL/HDL Ratio - Comprehensive metabolic panel  2. Primary hypertension We will check labs today, Christian [name] will continue his medications and we will follow-up at his next office visit.  - Comprehensive metabolic panel  3. Vitamin D deficiency We will check labs today, Christian [name] will continue his medications and we will follow-up at his next office visit.  - VITAMIN D 25 Hydroxy (Vit-D Deficiency, Fractures)  4. Insulin resistance We will check labs today, and we will follow-up at Christian [name] next office visit.  - Hemoglobin A1c - Insulin, random  5. Obesity, Current BMI 29.0 Christian [name] is currently in the action stage of change. As such, his goal is to continue with weight loss efforts. He has agreed to the Category 1 Plan.   He will follow the plan at least 80-90% of the  time.  Exercise goals: No exercise has been prescribed at this time. He has not been able to walk due to having surgery.  Behavioral modification strategies: increasing lean protein intake, decreasing simple carbohydrates, increasing vegetables, increasing water intake, decreasing eating out, no skipping meals, meal planning and cooking strategies, keeping healthy foods in the home, and planning for success.  Christian [name] has agreed to follow-up with our clinic in 4 to 5 weeks. He was informed of the importance of frequent follow-up visits to maximize his success with intensive lifestyle modifications for his multiple health conditions.   Christian [name] was informed we would discuss his lab results at his next visit unless there is a critical issue that needs to be addressed sooner. Christian [name] agreed to keep his next visit at the agreed upon time to discuss these results.  Objective:   Blood pressure 132/82, pulse (!) 57, temperature (!) 97.5 F (36.4 C), height '5\' 11"'$  (1.803 m), weight 208 lb (94.3 kg), SpO2 98 %. Body mass index is 29.01 kg/m.  General: Cooperative, alert, well developed, in no acute distress. HEENT: Conjunctivae and lids unremarkable. Cardiovascular: Regular rhythm.  Lungs: Normal work of breathing. Neurologic: No focal deficits.   Lab Results  Component Value Date   CREATININE 1.05 07/18/2022   BUN 12 07/18/2022   NA 142 07/18/2022   K 4.8 07/18/2022   CL 104 07/18/2022   CO2 25 07/18/2022   Lab Results  Component Value Date   ALT 10 07/18/2022   AST 16 07/18/2022   ALKPHOS 67 07/18/2022   BILITOT 0.6  07/18/2022   Lab Results  Component Value Date   HGBA1C 5.1 07/18/2022   HGBA1C 5.3 09/28/2021   HGBA1C 5.4 06/24/2020   Lab Results  Component Value Date   INSULIN 5.4 07/18/2022   INSULIN 5.1 09/28/2021   INSULIN 4.4 01/26/2021   INSULIN 6.8 10/05/2020   INSULIN 12.1 06/24/2020   Lab Results  Component Value Date   TSH 3.670 06/24/2020   Lab Results   Component Value Date   CHOL 122 07/18/2022   HDL 62 07/18/2022   LDLCALC 48 07/18/2022   TRIG 53 07/18/2022   CHOLHDL 2.4 06/15/2020   Lab Results  Component Value Date   VD25OH 59.4 07/18/2022   VD25OH 58.9 09/28/2021   VD25OH 36.6 01/26/2021   Lab Results  Component Value Date   WBC 6.0 03/30/2021   HGB 14.1 03/30/2021   HCT 41.3 03/30/2021   MCV 99.5 03/30/2021   PLT 140 (L) 03/30/2021   No results found for: "IRON", "TIBC", "FERRITIN"  Attestation Statements:   Reviewed by clinician on day of visit: allergies, medications, problem list, medical history, surgical history, family history, social history, and previous encounter notes.   Wilhemena Durie, am acting as Location manager for [employer], DO.  I have reviewed the above documentation for accuracy and completeness, and I agree with the above. Jearld Lesch, DO

## 2022-07-26 ENCOUNTER — Encounter (INDEPENDENT_AMBULATORY_CARE_PROVIDER_SITE_OTHER): Payer: Self-pay | Admitting: Bariatrics

## 2022-07-26 DIAGNOSIS — Z9889 Other specified postprocedural states: Secondary | ICD-10-CM | POA: Diagnosis not present

## 2022-07-26 DIAGNOSIS — K429 Umbilical hernia without obstruction or gangrene: Secondary | ICD-10-CM | POA: Diagnosis not present

## 2022-07-26 DIAGNOSIS — Z8719 Personal history of other diseases of the digestive system: Secondary | ICD-10-CM | POA: Diagnosis not present

## 2022-07-27 ENCOUNTER — Other Ambulatory Visit: Payer: Self-pay | Admitting: Cardiology

## 2022-07-27 DIAGNOSIS — E7800 Pure hypercholesterolemia, unspecified: Secondary | ICD-10-CM

## 2022-08-03 ENCOUNTER — Other Ambulatory Visit: Payer: Self-pay | Admitting: Cardiology

## 2022-08-03 ENCOUNTER — Encounter (INDEPENDENT_AMBULATORY_CARE_PROVIDER_SITE_OTHER): Payer: Self-pay

## 2022-08-03 DIAGNOSIS — I10 Essential (primary) hypertension: Secondary | ICD-10-CM

## 2022-08-09 DIAGNOSIS — H40013 Open angle with borderline findings, low risk, bilateral: Secondary | ICD-10-CM | POA: Diagnosis not present

## 2022-08-09 DIAGNOSIS — Z961 Presence of intraocular lens: Secondary | ICD-10-CM | POA: Diagnosis not present

## 2022-08-09 DIAGNOSIS — H04123 Dry eye syndrome of bilateral lacrimal glands: Secondary | ICD-10-CM | POA: Diagnosis not present

## 2022-08-09 DIAGNOSIS — H26493 Other secondary cataract, bilateral: Secondary | ICD-10-CM | POA: Diagnosis not present

## 2022-08-11 ENCOUNTER — Telehealth: Payer: Self-pay | Admitting: *Deleted

## 2022-08-11 NOTE — Telephone Encounter (Signed)
   Name: Christian [name]  DOB: 12/04/1946  MRN: [id number]  Primary Cardiologist: Fransico Him, MD  Chart reviewed as part of pre-operative protocol coverage. The patient has an upcoming visit scheduled with Dr. Heron Nay on 08/24/2022 at which time clearance can be addressed in case there are any issues that would impact surgical recommendations.  Colonoscopy is not scheduled until 10/27/2022 as below. I added preop FYI to appointment note so that provider is aware to address at time of outpatient visit.  Per office protocol the cardiology provider should forward their finalized clearance decision and recommendations regarding antiplatelet therapy to the requesting party below.    I will route this message as FYI to requesting party and remove this message from the preop box as separate preop APP input not needed at this time.   Please call with any questions.  Lenna Sciara, NP  08/11/2022, 11:50 AM

## 2022-08-11 NOTE — Telephone Encounter (Signed)
   Pre-operative Risk Assessment    Patient Name: Christian [name]  DOB: [birth year] MRN: [id number]      Request for Surgical Clearance    Procedure:   COLONSCOPY  Date of Surgery:  Clearance 10/27/22                                 Surgeon:  DR. Wilford Corner Surgeon's Group or Practice Name:  [facility] Phone number:  [phone] Fax number:  [phone]   Type of Clearance Requested:   - Medical  - Pharmacy:  Hold Aspirin NOT INDICATED   Type of Anesthesia:   PROPOFOL   Additional requests/questions:    Astrid Divine   08/11/2022, 11:27 AM

## 2022-08-16 ENCOUNTER — Encounter (INDEPENDENT_AMBULATORY_CARE_PROVIDER_SITE_OTHER): Payer: Self-pay | Admitting: Bariatrics

## 2022-08-16 ENCOUNTER — Ambulatory Visit (INDEPENDENT_AMBULATORY_CARE_PROVIDER_SITE_OTHER): Payer: Medicare Other | Admitting: Bariatrics

## 2022-08-16 VITALS — BP 123/79 | HR 54 | Temp 97.6°F | Ht 71.0 in | Wt 209.0 lb

## 2022-08-16 DIAGNOSIS — Z6829 Body mass index (BMI) 29.0-29.9, adult: Secondary | ICD-10-CM | POA: Diagnosis not present

## 2022-08-16 DIAGNOSIS — E669 Obesity, unspecified: Secondary | ICD-10-CM | POA: Diagnosis not present

## 2022-08-16 DIAGNOSIS — M6284 Sarcopenia: Secondary | ICD-10-CM

## 2022-08-16 DIAGNOSIS — E559 Vitamin D deficiency, unspecified: Secondary | ICD-10-CM | POA: Diagnosis not present

## 2022-08-19 ENCOUNTER — Other Ambulatory Visit: Payer: Self-pay | Admitting: Cardiology

## 2022-08-19 DIAGNOSIS — E7800 Pure hypercholesterolemia, unspecified: Secondary | ICD-10-CM

## 2022-08-22 NOTE — Progress Notes (Unsigned)
Chief Complaint:   OBESITY Sender is here to discuss his progress with his obesity treatment plan along with follow-up of his obesity related diagnoses. Christian [name] is on the Category 1 Plan and states he is following his eating plan approximately 90% of the time. Elmer states he is walking and swimming for 60 minutes 6 times per week.  Today's visit was #: 28 Starting weight: 278 lbs Starting date: 06/24/2020 Today's weight: 209 lbs Today's date: 08/16/22 Total lbs lost to date: 69 lbs Total lbs lost since last in-office visit: +1  Interim History: He is up 1 pound since his last visit.  He is trying to get 4 pounds down.  Subjective:   1. Vitamin D deficiency Taking a total of vitamin D 1000 IU daily.  2. Sarcopenia Muscle mass increased per bioimpedance scale.  Assessment/Plan:   1. Vitamin D deficiency Continue vitamin D.  2. Sarcopenia 1.  Walking. 2.  Will do arm exercises 3 times per week.  3. Obesity, Current BMI 29.2 1.  Keep water and protein high. 2.  Decrease carbohydrates.  Firman is currently in the action stage of change. As such, his goal is to continue with weight loss efforts. He has agreed to the Category 1 Plan.   Exercise goals: Walking and swimming 6 days/week.  Get back to his previous walking speed.  Behavioral modification strategies: increasing lean protein intake, decreasing simple carbohydrates, increasing vegetables, increasing water intake, decreasing eating out, no skipping meals, meal planning and cooking strategies, keeping healthy foods in the home, and planning for success.  Priest has agreed to follow-up with our clinic in 4 weeks. He was informed of the importance of frequent follow-up visits to maximize his success with intensive lifestyle modifications for his multiple health conditions.   Objective:   Blood pressure 123/79, pulse (!) 54, temperature 97.6 F (36.4 C), height '5\' 11"'$  (1.803 m), weight 209 lb (94.8 kg), SpO2 97  %. Body mass index is 29.15 kg/m.  General: Cooperative, alert, well developed, in no acute distress. HEENT: Conjunctivae and lids unremarkable. Cardiovascular: Regular rhythm.  Lungs: Normal work of breathing. Neurologic: No focal deficits.   Lab Results  Component Value Date   CREATININE 1.05 07/18/2022   BUN 12 07/18/2022   NA 142 07/18/2022   K 4.8 07/18/2022   CL 104 07/18/2022   CO2 25 07/18/2022   Lab Results  Component Value Date   ALT 10 07/18/2022   AST 16 07/18/2022   ALKPHOS 67 07/18/2022   BILITOT 0.6 07/18/2022   Lab Results  Component Value Date   HGBA1C 5.1 07/18/2022   HGBA1C 5.3 09/28/2021   HGBA1C 5.4 06/24/2020   Lab Results  Component Value Date   INSULIN 5.4 07/18/2022   INSULIN 5.1 09/28/2021   INSULIN 4.4 01/26/2021   INSULIN 6.8 10/05/2020   INSULIN 12.1 06/24/2020   Lab Results  Component Value Date   TSH 3.670 06/24/2020   Lab Results  Component Value Date   CHOL 122 07/18/2022   HDL 62 07/18/2022   LDLCALC 48 07/18/2022   TRIG 53 07/18/2022   CHOLHDL 2.4 06/15/2020   Lab Results  Component Value Date   VD25OH 59.4 07/18/2022   VD25OH 58.9 09/28/2021   VD25OH 36.6 01/26/2021   Lab Results  Component Value Date   WBC 6.0 03/30/2021   HGB 14.1 03/30/2021   HCT 41.3 03/30/2021   MCV 99.5 03/30/2021   PLT 140 (L) 03/30/2021   No results found  for: "IRON", "TIBC", "FERRITIN"   Attestation Statements:   Reviewed by clinician on day of visit: allergies, medications, problem list, medical history, surgical history, family history, social history, and previous encounter notes.  I, Dawn Whitmire, FNP-C, am acting as transcriptionist for Dr. Jearld Lesch.  I have reviewed the above documentation for accuracy and completeness, and I agree with the above. Jearld Lesch, DO

## 2022-08-23 ENCOUNTER — Encounter (INDEPENDENT_AMBULATORY_CARE_PROVIDER_SITE_OTHER): Payer: Self-pay | Admitting: Bariatrics

## 2022-08-24 ENCOUNTER — Ambulatory Visit: Payer: Medicare Other | Attending: Cardiology | Admitting: Cardiology

## 2022-08-24 ENCOUNTER — Encounter: Payer: Self-pay | Admitting: Cardiology

## 2022-08-24 VITALS — BP 136/80 | HR 52 | Ht 71.0 in | Wt 214.2 lb

## 2022-08-24 DIAGNOSIS — I10 Essential (primary) hypertension: Secondary | ICD-10-CM | POA: Insufficient documentation

## 2022-08-24 DIAGNOSIS — I951 Orthostatic hypotension: Secondary | ICD-10-CM

## 2022-08-24 DIAGNOSIS — I493 Ventricular premature depolarization: Secondary | ICD-10-CM | POA: Diagnosis not present

## 2022-08-24 DIAGNOSIS — I2510 Atherosclerotic heart disease of native coronary artery without angina pectoris: Secondary | ICD-10-CM

## 2022-08-24 DIAGNOSIS — R001 Bradycardia, unspecified: Secondary | ICD-10-CM | POA: Diagnosis not present

## 2022-08-24 DIAGNOSIS — E7800 Pure hypercholesterolemia, unspecified: Secondary | ICD-10-CM | POA: Insufficient documentation

## 2022-08-24 NOTE — Progress Notes (Signed)
Date:  08/24/2022   ID:  TAOS TAPP, DOB 02/03/1946, MRN [id number]  PCP:  Aurea Graff.Marlou Sa, MD  Cardiologist:  Fransico Him, MD  Electrophysiologist:  None   Evaluation Performed:  Follow-Up Visit  Chief Complaint:  CAD, HTN, orthostatic hypotension, HLD  History of Present Illness:    Christian [name] is a 76 y.o. male with CAD by cath 11/2016, HTN also with orthostatic hypertension, hyperlipidemia, arthritis, cataracts, HLD, PVCs, sciatica who presents for follow-up of blood pressure.   To recap prior history, he had a prior heart cath in 2017 showing mild-moderate LAD, circumflex and RCA disease without high grade obstruction. LVEDP was elevated, EF was 50%. Medical therapy was recommended. Nuclear stress test in 2019 for atypical CP was normal.  PVCs were noted during the study and f/u Holter was done 12/2016 showing NSR with frequent PACs, nonsustained atrial tachycardia up to 6-9 beats, and frequent PVCs (PVC load was 2.8%). He had issues with recurrent CP in 09/2018. Stress test was normal. Echo showed EF 60-65%, grade 1 DD, mild AI, mild LAE/RAE.   Repeat heart monitor showed sinus bradycardia, NSR to sinus tach, average HR 64 bpm, range 46->125bpm, frequent PVCs, load 5.5%, occasional PACs and nonsustained atrial tachycardia. The 46bpm was only during sleeping hours.   he is here today for followup and is doing well.  He denies any chest pain or pressure, SOB, DOE, PND, orthopnea, LE edema, dizziness, palpitations or syncope. He is compliant with his meds and is tolerating meds with no SE.    Past Medical History:  Diagnosis Date   Arthritis    "joints; shoulders; left elbow; right thumb" (05/06/2014)   Atrial tachycardia (HCC)    Breathing problem    Cataracts, bilateral    immature   Chest pain    Constipation    takes Colace and Metamucil daily   Coronary artery disease 02/2011   cath 11/2016 showing 20% RCA, 50% OM, 35% mid LCx, 70% D1, 65% mid LAD, 70% distal  LAD 80% on medical management   Diastolic dysfunction    GERD (gastroesophageal reflux disease)    Heart murmur    Hemorrhoids    History of bronchitis    Hypercholesterolemia    takes Crestor daily   Hypertension    Joint pain    Joint pain    Nocturia    Orthostatic hypotension    Palpitations    Premature atrial contractions    PVC's (premature ventricular contractions)    Restless leg syndrome    Rheumatoid arthritis ([place])    Sciatica    Sinus bradycardia    Past Surgical History:  Procedure Laterality Date   ACHILLES TENDON REPAIR Left    "& fixed a spur"   CARDIAC CATHETERIZATION  2012   CARDIAC CATHETERIZATION N/A 12/06/2016   Procedure: Left Heart Cath and Coronary Angiography;  Surgeon: Belva Crome, MD;  Location: [facility];  Service: Cardiovascular;  Laterality: N/A;   COLONOSCOPY     FLEXIBLE SIGMOIDOSCOPY  X 2   HAND SURGERY Left    "thumb; cleaned out arthritis"   HEEL SPUR EXCISION Left    KNEE ARTHROSCOPY Right    LUMBAR LAMINECTOMY/DECOMPRESSION MICRODISCECTOMY N/A 03/30/2021   Procedure: LEFT L3 HEMILAMINECTOMY WITH EXCISION OF DISC HERNIATION;  Surgeon: Jessy Oto, MD;  Location: [facility];  Service: Orthopedics;  Laterality: N/A;   SHOULDER SURGERY Right X 2   "cleaned out spurs"   TOE FUSION Right  great toe; plates and screws   TONSILLECTOMY  ~ [place] Right 09/22/2015   Procedure: TOTAL HIP ARTHROPLASTY;  Surgeon: Garald Balding, MD;  Location: [facility];  Service: Orthopedics;  Laterality: Right;   TOTAL KNEE ARTHROPLASTY Right 05/06/2014   TOTAL KNEE ARTHROPLASTY Right 05/06/2014   Procedure: RIGHT TOTAL KNEE ARTHROPLASTY;  Surgeon: Garald Balding, MD;  Location: [facility];  Service: Orthopedics;  Laterality: Right;   TOTAL KNEE ARTHROPLASTY Left 02/05/2019   Procedure: LEFT TOTAL KNEE ARTHROPLASTY;  Surgeon: Garald Balding, MD;  Location: [facility];  Service: Orthopedics;  Laterality: Left;     Current Meds   Medication Sig   acetaminophen (TYLENOL) 500 MG tablet Take 1,000 mg by mouth every 8 (eight) hours as needed for moderate pain.   aspirin 81 MG tablet Take 81 mg by mouth daily.   cholecalciferol (VITAMIN D3) 25 MCG (1000 UNIT) tablet Take 1,000 Units by mouth daily.   docusate sodium (COLACE) 100 MG capsule Take 100 mg by mouth 3 (three) times daily.   furosemide (LASIX) 20 MG tablet TAKE 1 TABLET BY MOUTH EVERY MON, WED, FRI   isosorbide mononitrate (IMDUR) 60 MG 24 hr tablet TAKE 1 TABLET BY MOUTH EVERY DAY   rosuvastatin (CRESTOR) 20 MG tablet TAKE 1 TABLET BY MOUTH EVERY DAY     Allergies:   Atorvastatin calcium [atorvastatin], Simvastatin, and Pravastatin   Social History   Tobacco Use   Smoking status: Former    Packs/day: 2.00    Years: 10.00    Total pack years: 20.00    Types: Cigarettes    Quit date: 10    Years since quitting: 42.6   Smokeless tobacco: Never   Tobacco comments:    quit smoking in 1981  Vaping Use   Vaping Use: Never used  Substance Use Topics   Alcohol use: Yes    Comment: "drink a beer sometimes once/month"   Drug use: No     Family Hx: The patient's family history includes CVA in his father and mother; Heart disease in his father; High Cholesterol in his father; High blood pressure in his father; Stroke in his father and mother.  ROS:   Please see the history of present illness.    All other systems reviewed and are negative.   Prior CV studies:    Most recent pertinent cardiac studies are outlined and summarized above. Reports included below if pertinent.  2D echo 09/2018 - Left ventricle: The cavity size was normal. Wall thickness was    normal. Systolic function was normal. The estimated ejection    fraction was in the range of 60% to 65%. Wall motion was normal;    there were no regional wall motion abnormalities. Doppler    parameters are consistent with abnormal left ventricular    relaxation (grade 1 diastolic dysfunction).   - Aortic valve: Mildly to moderately calcified annulus. Mildly    thickened leaflets. There was mild regurgitation.  - Left atrium: The atrium was mildly dilated.  - Right atrium: The atrium was mildly dilated.     Labs/Other Tests and Data Reviewed:    EKG: EKG was performed in the office today and demonstrates sinus bradycardia at 52bpm with no ST changes  Recent Labs: 07/18/2022: ALT 10; BUN 12; Creatinine, Ser 1.05; Potassium 4.8; Sodium 142   Recent Lipid Panel Lab Results  Component Value Date/Time   CHOL 122 07/18/2022 10:12 AM   TRIG 53 07/18/2022 10:12  AM   HDL 62 07/18/2022 10:12 AM   CHOLHDL 2.4 06/15/2020 10:48 AM   CHOLHDL 2.9 04/27/2016 08:26 AM   LDLCALC 48 07/18/2022 10:12 AM    Wt Readings from Last 3 Encounters:  08/24/22 214 lb 3.2 oz (97.2 kg)  08/16/22 209 lb (94.8 kg)  07/18/22 208 lb (94.3 kg)     Objective:    Vital Signs:  BP 136/80   Pulse (!) 52   Ht '5\' 11"'$  (1.803 m)   Wt 214 lb 3.2 oz (97.2 kg)   SpO2 97%   BMI 29.87 kg/m    GEN: Well nourished, well developed in no acute distress HEENT: Normal NECK: No JVD; No carotid bruits LYMPHATICS: No lymphadenopathy CARDIAC:RRR, no murmurs, rubs, gallops RESPIRATORY:  Clear to auscultation without rales, wheezing or rhonchi  ABDOMEN: Soft, non-tender, non-distended MUSCULOSKELETAL:  No edema; No deformity  SKIN: Warm and dry NEUROLOGIC:  Alert and oriented x 3 PSYCHIATRIC:  Normal affect  ASSESSMENT & PLAN:    1.  HTN -BP is adequate controlled on exam today -He has not quired any antihypertensive medication -he has been on Lasix for BP control when he was overweight but has lost 85lbs and has no edema so I told him to stop the Lasix and follow his BP closely and let me know if his BP goes up  2.  ASCAD -cath in 2017 showing mild-moderate LAD, circumflex and RCA disease without high grade obstruction. -He is any anginal symptoms -Continue prescription drug management with aspirin 81 mg  daily and Imdur 60 mg daily as well as statin therapy with as needed refills  3.  PVCs -last event monitor showed PVC load of 5.5% -He occasionally has some skipped heartbeats but these are fairly asymptomatic  4.  Bradycardia  -HR 52bpm today and asymptomatic  5.  HLD -LDL goal < 70 -I have personally reviewed and interpreted outside labs performed by patient's PCP which showed LDL 48 and HDL 62 on 07/18/2022 -Continue prescription drug management with Crestor 20 mg daily with as needed refills  6.  Orthostatic hypotension -this is stable with no recent dizziness unless he stands up too fast -encouraged him to stay hydrated when out in the heat  Medication Adjustments/Labs and Tests Ordered: Current medicines are reviewed at length with the patient today.  Concerns regarding medicines are outlined above.   Follow Up: 1 year  Signed, Fransico Him, MD  08/24/2022 9:25 AM    [place]

## 2022-08-24 NOTE — Addendum Note (Signed)
Addended by: Antonieta Iba on: 08/24/2022 09:33 AM   Modules accepted: Orders

## 2022-08-24 NOTE — Patient Instructions (Signed)
Medication Instructions:  Your physician has recommended you make the following change in your medication: 1) STOP taking Lasix (furosemide)  *If you need a refill on your cardiac medications before your next appointment, please call your pharmacy*  Follow-Up: At [facility], you and your health needs are our priority.  As part of our continuing mission to provide you with exceptional heart care, we have created designated Provider Care Teams.  These Care Teams include your primary Cardiologist (physician) and Advanced Practice Providers (APPs -  Physician Assistants and Nurse Practitioners) who all work together to provide you with the care you need, when you need it.   Your next appointment:   1 year(s)  The format for your next appointment:   In Person  Provider:   Fransico Him, MD    Important Information About Sugar

## 2022-08-25 ENCOUNTER — Other Ambulatory Visit: Payer: Self-pay | Admitting: Cardiology

## 2022-08-25 DIAGNOSIS — I10 Essential (primary) hypertension: Secondary | ICD-10-CM

## 2022-08-29 ENCOUNTER — Encounter: Payer: Self-pay | Admitting: Cardiology

## 2022-08-29 ENCOUNTER — Other Ambulatory Visit: Payer: Self-pay | Admitting: Cardiology

## 2022-09-07 NOTE — Telephone Encounter (Signed)
   Name: Christian [name]  DOB: 02-10-1946  MRN: [id number]   Primary Cardiologist: Fransico Him, MD  Chart reviewed as part of pre-operative protocol coverage. Patient was contacted 09/07/2022 in reference to pre-operative risk assessment for pending surgery as outlined be/low.  Christian [name] was last seen on 08/24/2022 by [name].  Since that day, Christian [name] has done well from a cardiac standpoint. He is able to complete >4 METS. Therefore, based on ACC/AHA guidelines, the patient would be at acceptable risk for the planned procedure without further cardiovascular testing.   Per office protocol, if necessary, patient may hold aspirin for 5 to 7 days prior to procedure.  Please resume aspirin as soon as possible postprocedure, the discretion of the surgeon.  The patient was advised that if he develops new symptoms prior to surgery to contact our office to arrange for a follow-up visit, and he verbalized understanding.  I will route this recommendation to the requesting party via Epic fax function and remove from pre-op pool. Please call with questions.  Lenna Sciara, NP 09/07/2022, 11:21 AM /

## 2022-09-19 ENCOUNTER — Other Ambulatory Visit (HOSPITAL_BASED_OUTPATIENT_CLINIC_OR_DEPARTMENT_OTHER): Payer: Self-pay

## 2022-09-19 ENCOUNTER — Encounter: Payer: Self-pay | Admitting: Bariatrics

## 2022-09-19 ENCOUNTER — Ambulatory Visit (INDEPENDENT_AMBULATORY_CARE_PROVIDER_SITE_OTHER): Payer: Medicare Other | Admitting: Bariatrics

## 2022-09-19 VITALS — BP 149/88 | HR 59 | Temp 97.7°F | Ht 71.0 in | Wt 210.0 lb

## 2022-09-19 DIAGNOSIS — E88819 Insulin resistance, unspecified: Secondary | ICD-10-CM | POA: Insufficient documentation

## 2022-09-19 DIAGNOSIS — Z6829 Body mass index (BMI) 29.0-29.9, adult: Secondary | ICD-10-CM

## 2022-09-19 DIAGNOSIS — E669 Obesity, unspecified: Secondary | ICD-10-CM | POA: Diagnosis not present

## 2022-09-19 DIAGNOSIS — R632 Polyphagia: Secondary | ICD-10-CM | POA: Diagnosis not present

## 2022-09-19 DIAGNOSIS — E8881 Metabolic syndrome: Secondary | ICD-10-CM

## 2022-09-19 DIAGNOSIS — I10 Essential (primary) hypertension: Secondary | ICD-10-CM

## 2022-09-19 MED ORDER — FLUAD QUADRIVALENT 0.5 ML IM PRSY
PREFILLED_SYRINGE | INTRAMUSCULAR | 0 refills | Status: DC
  Filled 2022-09-19: qty 0.5, 1d supply, fill #0

## 2022-09-20 NOTE — Progress Notes (Signed)
Chief Complaint:   OBESITY Christian [name] is here to discuss his progress with his obesity treatment plan along with follow-up of his obesity related diagnoses. Kevork is on the Category 1 Plan and states he is following his eating plan approximately 90% of the time. Elster states he is walking for 60 minutes 6 times per week.  Today's visit was #: 84 Starting weight: 278 lbs Starting date: 06/24/2020 Today's weight: 210 lbs Today's date: 09/19/22 Total lbs lost to date: 19 Total lbs lost since last in-office visit: +1  Interim History: He is up 1 pound since his last visit but maintaining well.  His "heart doctor" took him off the Lasix.  He is up 1 pound of water per the bioimpedance.  Subjective:   1. Polyphagia Notes hunger after exercise.  2. Insulin resistance No medications.  3. Primary hypertension Stopped Lasix.  Blood pressure up slightly.  Assessment/Plan:   1. Polyphagia 1.  Have 1 protein shake after exercise (Core Power, Fair Life, Premier).  2. Insulin resistance 1.  Decrease carbohydrates (sweets and starches).  3. Primary hypertension 1.  Will watch his salt, no added salt. 2.  Keep exercising 3. Will balance his water intake.   4. Obesity, Current BMI 29.3 1.  Will wear his support hose in the fall. 2.  Will keep water intake to no more than 64 ounces per day. 3.  Increase protein  Daegen is currently in the action stage of change. As such, his goal is to continue with weight loss efforts. He has agreed to the Category 1 Plan.   Exercise goals: Walking 45 to 50 minutes 6 times per week.  Behavioral modification strategies: increasing lean protein intake, decreasing simple carbohydrates, increasing vegetables, increasing water intake, decreasing eating out, no skipping meals, meal planning and cooking strategies, keeping healthy foods in the home, and planning for success.  Dmitry has agreed to follow-up with our clinic in 4-6 weeks. He was  informed of the importance of frequent follow-up visits to maximize his success with intensive lifestyle modifications for his multiple health conditions.    Objective:   Blood pressure (!) 149/88, pulse (!) 59, temperature 97.7 F (36.5 C), height '5\' 11"'$  (1.803 m), weight 210 lb (95.3 kg), SpO2 98 %. Body mass index is 29.29 kg/m.  General: Cooperative, alert, well developed, in no acute distress. HEENT: Conjunctivae and lids unremarkable. Cardiovascular: Regular rhythm.  Lungs: Normal work of breathing. Neurologic: No focal deficits.   Lab Results  Component Value Date   CREATININE 1.05 07/18/2022   BUN 12 07/18/2022   NA 142 07/18/2022   K 4.8 07/18/2022   CL 104 07/18/2022   CO2 25 07/18/2022   Lab Results  Component Value Date   ALT 10 07/18/2022   AST 16 07/18/2022   ALKPHOS 67 07/18/2022   BILITOT 0.6 07/18/2022   Lab Results  Component Value Date   HGBA1C 5.1 07/18/2022   HGBA1C 5.3 09/28/2021   HGBA1C 5.4 06/24/2020   Lab Results  Component Value Date   INSULIN 5.4 07/18/2022   INSULIN 5.1 09/28/2021   INSULIN 4.4 01/26/2021   INSULIN 6.8 10/05/2020   INSULIN 12.1 06/24/2020   Lab Results  Component Value Date   TSH 3.670 06/24/2020   Lab Results  Component Value Date   CHOL 122 07/18/2022   HDL 62 07/18/2022   LDLCALC 48 07/18/2022   TRIG 53 07/18/2022   CHOLHDL 2.4 06/15/2020   Lab Results  Component Value Date  VD25OH 59.4 07/18/2022   VD25OH 58.9 09/28/2021   VD25OH 36.6 01/26/2021   Lab Results  Component Value Date   WBC 6.0 03/30/2021   HGB 14.1 03/30/2021   HCT 41.3 03/30/2021   MCV 99.5 03/30/2021   PLT 140 (L) 03/30/2021   No results found for: "IRON", "TIBC", "FERRITIN"  Attestation Statements:   Reviewed by clinician on day of visit: allergies, medications, problem list, medical history, surgical history, family history, social history, and previous encounter notes.  I, Dawn Whitmire, FNP-C, am acting as  transcriptionist for Dr. Jearld Lesch.  I have reviewed the above documentation for accuracy and completeness, and I agree with the above. Jearld Lesch, DO

## 2022-10-13 ENCOUNTER — Other Ambulatory Visit (HOSPITAL_BASED_OUTPATIENT_CLINIC_OR_DEPARTMENT_OTHER): Payer: Self-pay

## 2022-10-13 MED ORDER — COMIRNATY 30 MCG/0.3ML IM SUSY
PREFILLED_SYRINGE | INTRAMUSCULAR | 0 refills | Status: DC
  Filled 2022-10-13: qty 0.3, 1d supply, fill #0

## 2022-10-27 DIAGNOSIS — D124 Benign neoplasm of descending colon: Secondary | ICD-10-CM | POA: Diagnosis not present

## 2022-10-27 DIAGNOSIS — K5730 Diverticulosis of large intestine without perforation or abscess without bleeding: Secondary | ICD-10-CM | POA: Diagnosis not present

## 2022-10-27 DIAGNOSIS — Z1211 Encounter for screening for malignant neoplasm of colon: Secondary | ICD-10-CM | POA: Diagnosis not present

## 2022-10-27 DIAGNOSIS — D120 Benign neoplasm of cecum: Secondary | ICD-10-CM | POA: Diagnosis not present

## 2022-10-27 DIAGNOSIS — K648 Other hemorrhoids: Secondary | ICD-10-CM | POA: Diagnosis not present

## 2022-10-31 ENCOUNTER — Ambulatory Visit: Payer: Medicare Other | Admitting: Bariatrics

## 2022-10-31 DIAGNOSIS — D120 Benign neoplasm of cecum: Secondary | ICD-10-CM | POA: Diagnosis not present

## 2022-10-31 DIAGNOSIS — D124 Benign neoplasm of descending colon: Secondary | ICD-10-CM | POA: Diagnosis not present

## 2022-11-02 ENCOUNTER — Ambulatory Visit (INDEPENDENT_AMBULATORY_CARE_PROVIDER_SITE_OTHER): Payer: Medicare Other | Admitting: Bariatrics

## 2022-11-02 ENCOUNTER — Encounter: Payer: Self-pay | Admitting: Bariatrics

## 2022-11-02 VITALS — BP 101/64 | HR 54 | Temp 97.0°F | Ht 71.0 in | Wt 211.0 lb

## 2022-11-02 DIAGNOSIS — M6284 Sarcopenia: Secondary | ICD-10-CM | POA: Diagnosis not present

## 2022-11-02 DIAGNOSIS — Z6829 Body mass index (BMI) 29.0-29.9, adult: Secondary | ICD-10-CM

## 2022-11-02 DIAGNOSIS — E669 Obesity, unspecified: Secondary | ICD-10-CM | POA: Diagnosis not present

## 2022-11-02 DIAGNOSIS — I10 Essential (primary) hypertension: Secondary | ICD-10-CM | POA: Insufficient documentation

## 2022-11-14 ENCOUNTER — Encounter: Payer: Self-pay | Admitting: Bariatrics

## 2022-11-14 NOTE — Progress Notes (Signed)
Chief Complaint:   OBESITY Yazir is here to discuss his progress with his obesity treatment plan along with follow-up of his obesity related diagnoses. Laren is on the Category 1 Plan and states he is following his eating plan approximately 90% of the time. Taequan states he is walking 3 miles 6 times per week.    Today's visit was #: 49 Starting weight: 278 lbs Starting date: 06/24/2020 Today's weight: 211 lbs Today's date: 11/02/2022 Total lbs lost to date: 4 Total lbs lost since last in-office visit: 0  Interim History: Forbes is up 1 lb since his last visit. He is adhering closely to the plan. He is doing well with his protein and water intake.   Subjective:   1. Essential hypertension Khaled's blood pressure is well controlled.   2. Sarcopenia Lonzy is stable and he is working on increasing his muscle mass.   Assessment/Plan:   1. Essential hypertension Anzel will continue his medications as directed.   2. Sarcopenia Haynes Bast will continue working on Charity fundraiser.   3. Obesity, Current BMI 29.5 Lowery is currently in the action stage of change. As such, his goal is to continue with weight loss efforts. He has agreed to the Category 1 Plan + 100 calories.   Wesson will continue to adhere to the plan 85-95%. Increase fiber (fiber handout was given).   Exercise goals: As is.   Behavioral modification strategies: increasing lean protein intake, decreasing simple carbohydrates, increasing vegetables, increasing water intake, decreasing eating out, no skipping meals, meal planning and cooking strategies, keeping healthy foods in the home, and planning for success.  Loi has agreed to follow-up with our clinic in 4 to 6 weeks. He was informed of the importance of frequent follow-up visits to maximize his success with intensive lifestyle modifications for his multiple health conditions.   Objective:   Blood pressure 101/64, pulse (!) 54, temperature  (!) 97 F (36.1 C), height '5\' 11"'$  (1.803 m), weight 211 lb (95.7 kg), SpO2 98 %. Body mass index is 29.43 kg/m.  General: Cooperative, alert, well developed, in no acute distress. HEENT: Conjunctivae and lids unremarkable. Cardiovascular: Regular rhythm.  Lungs: Normal work of breathing. Neurologic: No focal deficits.   Lab Results  Component Value Date   CREATININE 1.05 07/18/2022   BUN 12 07/18/2022   NA 142 07/18/2022   K 4.8 07/18/2022   CL 104 07/18/2022   CO2 25 07/18/2022   Lab Results  Component Value Date   ALT 10 07/18/2022   AST 16 07/18/2022   ALKPHOS 67 07/18/2022   BILITOT 0.6 07/18/2022   Lab Results  Component Value Date   HGBA1C 5.1 07/18/2022   HGBA1C 5.3 09/28/2021   HGBA1C 5.4 06/24/2020   Lab Results  Component Value Date   INSULIN 5.4 07/18/2022   INSULIN 5.1 09/28/2021   INSULIN 4.4 01/26/2021   INSULIN 6.8 10/05/2020   INSULIN 12.1 06/24/2020   Lab Results  Component Value Date   TSH 3.670 06/24/2020   Lab Results  Component Value Date   CHOL 122 07/18/2022   HDL 62 07/18/2022   LDLCALC 48 07/18/2022   TRIG 53 07/18/2022   CHOLHDL 2.4 06/15/2020   Lab Results  Component Value Date   VD25OH 59.4 07/18/2022   VD25OH 58.9 09/28/2021   VD25OH 36.6 01/26/2021   Lab Results  Component Value Date   WBC 6.0 03/30/2021   HGB 14.1 03/30/2021   HCT 41.3 03/30/2021   MCV 99.5 03/30/2021  PLT 140 (L) 03/30/2021   No results found for: "IRON", "TIBC", "FERRITIN"  Attestation Statements:   Reviewed by clinician on day of visit: allergies, medications, problem list, medical history, surgical history, family history, social history, and previous encounter notes.   Wilhemena Durie, am acting as Location manager for [employer], DO.  I have reviewed the above documentation for accuracy and completeness, and I agree with the above. Jearld Lesch, DO

## 2022-12-02 DIAGNOSIS — H35033 Hypertensive retinopathy, bilateral: Secondary | ICD-10-CM | POA: Diagnosis not present

## 2022-12-02 DIAGNOSIS — M25512 Pain in left shoulder: Secondary | ICD-10-CM | POA: Diagnosis not present

## 2022-12-02 DIAGNOSIS — I2510 Atherosclerotic heart disease of native coronary artery without angina pectoris: Secondary | ICD-10-CM | POA: Diagnosis not present

## 2022-12-02 DIAGNOSIS — I10 Essential (primary) hypertension: Secondary | ICD-10-CM | POA: Diagnosis not present

## 2022-12-02 DIAGNOSIS — Z125 Encounter for screening for malignant neoplasm of prostate: Secondary | ICD-10-CM | POA: Diagnosis not present

## 2022-12-02 DIAGNOSIS — Z1211 Encounter for screening for malignant neoplasm of colon: Secondary | ICD-10-CM | POA: Diagnosis not present

## 2022-12-02 DIAGNOSIS — Z0000 Encounter for general adult medical examination without abnormal findings: Secondary | ICD-10-CM | POA: Diagnosis not present

## 2022-12-07 ENCOUNTER — Other Ambulatory Visit (HOSPITAL_BASED_OUTPATIENT_CLINIC_OR_DEPARTMENT_OTHER): Payer: Self-pay

## 2022-12-07 MED ORDER — AREXVY 120 MCG/0.5ML IM SUSR
INTRAMUSCULAR | 0 refills | Status: DC
  Filled 2022-12-07: qty 1, 1d supply, fill #0

## 2022-12-12 DIAGNOSIS — M25512 Pain in left shoulder: Secondary | ICD-10-CM | POA: Diagnosis not present

## 2022-12-12 DIAGNOSIS — M19012 Primary osteoarthritis, left shoulder: Secondary | ICD-10-CM | POA: Diagnosis not present

## 2022-12-13 ENCOUNTER — Encounter: Payer: Self-pay | Admitting: Bariatrics

## 2022-12-13 ENCOUNTER — Ambulatory Visit (INDEPENDENT_AMBULATORY_CARE_PROVIDER_SITE_OTHER): Payer: Medicare Other | Admitting: Bariatrics

## 2022-12-13 VITALS — BP 131/80 | HR 55 | Temp 97.6°F | Ht 71.0 in | Wt 214.0 lb

## 2022-12-13 DIAGNOSIS — E88819 Insulin resistance, unspecified: Secondary | ICD-10-CM

## 2022-12-13 DIAGNOSIS — Z6839 Body mass index (BMI) 39.0-39.9, adult: Secondary | ICD-10-CM

## 2022-12-13 DIAGNOSIS — I10 Essential (primary) hypertension: Secondary | ICD-10-CM | POA: Diagnosis not present

## 2022-12-13 DIAGNOSIS — E669 Obesity, unspecified: Secondary | ICD-10-CM

## 2022-12-13 DIAGNOSIS — Z6830 Body mass index (BMI) 30.0-30.9, adult: Secondary | ICD-10-CM

## 2022-12-29 NOTE — Patient Instructions (Signed)

## 2023-01-03 ENCOUNTER — Encounter: Payer: Self-pay | Admitting: Bariatrics

## 2023-01-03 NOTE — Progress Notes (Signed)
Chief Complaint:   OBESITY Christian [name] is here to discuss his progress with his obesity treatment plan along with follow-up of his obesity related diagnoses. Christian [name] is on the Category 1 Plan and states he is following his eating plan approximately 90% of the time. Christian [name] states he is walking for 60 minutes 6 times per week.  Today's visit was #: 23 Starting weight: 278 lbs Starting date: 06/24/2020 Today's weight: 214 lbs Today's date: 12/13/2022 Total lbs lost to date: 64 Total lbs lost since last in-office visit: 0  Interim History: Christian [name] is up 3 pounds since his last visit, but his provider took him off his Lasix since he was last seen.  He is up approximately 2 pounds in water weight per our bioimpedance scale.  Subjective:   1. Insulin resistance Christian [name] is not on medications currently.  2. Primary hypertension Christian [name]'s blood pressure is well-controlled.  Assessment/Plan:   1. Insulin resistance Christian [name] will work on keeping all carbohydrates low (sugar and starches)  2. Primary hypertension Christian [name] will continue his medications.  If he retains more than 3 pounds of fluid he is to call his PCP.  He is to work on eliminating salt and increase his water intake to 64 ounces, and keep walking.  He will wear his compression socks.  3. Obesity, Current BMI 30.0 Christian [name] is currently in the action stage of change. As such, his goal is to continue with weight loss efforts. He has agreed to the Category 1 Plan.   Meal planning was discussed.  He will work on eliminating added salt and will stay away from the sweets.  (He will be going back to [place] in January)  Exercise goals: As is.   Behavioral modification strategies: increasing lean protein intake, decreasing simple carbohydrates, increasing vegetables, increasing water intake, decreasing eating out, no skipping meals, meal planning and cooking strategies, keeping healthy foods in the home, and planning for  success.  Christian [name] has agreed to follow-up with our clinic in 6 to 8 weeks. He was informed of the importance of frequent follow-up visits to maximize his success with intensive lifestyle modifications for his multiple health conditions.   Objective:   Blood pressure 131/80, pulse (!) 55, temperature 97.6 F (36.4 C), height '5\' 11"'$  (1.803 m), weight 214 lb (97.1 kg), SpO2 97 %. Body mass index is 29.85 kg/m.  General: Cooperative, alert, well developed, in no acute distress. HEENT: Conjunctivae and lids unremarkable. Cardiovascular: Regular rhythm.  Lungs: Normal work of breathing. Neurologic: No focal deficits.   Lab Results  Component Value Date   CREATININE 1.05 07/18/2022   BUN 12 07/18/2022   NA 142 07/18/2022   K 4.8 07/18/2022   CL 104 07/18/2022   CO2 25 07/18/2022   Lab Results  Component Value Date   ALT 10 07/18/2022   AST 16 07/18/2022   ALKPHOS 67 07/18/2022   BILITOT 0.6 07/18/2022   Lab Results  Component Value Date   HGBA1C 5.1 07/18/2022   HGBA1C 5.3 09/28/2021   HGBA1C 5.4 06/24/2020   Lab Results  Component Value Date   INSULIN 5.4 07/18/2022   INSULIN 5.1 09/28/2021   INSULIN 4.4 01/26/2021   INSULIN 6.8 10/05/2020   INSULIN 12.1 06/24/2020   Lab Results  Component Value Date   TSH 3.670 06/24/2020   Lab Results  Component Value Date   CHOL 122 07/18/2022   HDL 62 07/18/2022   LDLCALC 48 07/18/2022   TRIG 53 07/18/2022   CHOLHDL 2.4  06/15/2020   Lab Results  Component Value Date   VD25OH 59.4 07/18/2022   VD25OH 58.9 09/28/2021   VD25OH 36.6 01/26/2021   Lab Results  Component Value Date   WBC 6.0 03/30/2021   HGB 14.1 03/30/2021   HCT 41.3 03/30/2021   MCV 99.5 03/30/2021   PLT 140 (L) 03/30/2021   No results found for: "IRON", "TIBC", "FERRITIN"  Attestation Statements:   Reviewed by clinician on day of visit: allergies, medications, problem list, medical history, surgical history, family history, social history, and  previous encounter notes.   Wilhemena Durie, am acting as Location manager for [employer], DO.  I have reviewed the above documentation for accuracy and completeness, and I agree with the above. Jearld Lesch, DO

## 2023-01-05 ENCOUNTER — Encounter: Payer: Self-pay | Admitting: Cardiology

## 2023-01-05 DIAGNOSIS — I10 Essential (primary) hypertension: Secondary | ICD-10-CM

## 2023-01-05 MED ORDER — LOSARTAN POTASSIUM 25 MG PO TABS: 25.0000 mg | ORAL_TABLET | Freq: Every day | ORAL | 6 refills | Status: DC

## 2023-01-05 NOTE — Telephone Encounter (Signed)
Christian Margarita, MD      Start Losartan '25mg'$  daily and check BP twice daily for a week and call with results and get a BMET in 1 week

## 2023-01-12 ENCOUNTER — Ambulatory Visit: Payer: Medicare Other | Attending: Cardiology

## 2023-01-12 ENCOUNTER — Encounter: Payer: Self-pay | Admitting: Cardiology

## 2023-01-12 DIAGNOSIS — I10 Essential (primary) hypertension: Secondary | ICD-10-CM

## 2023-01-12 DIAGNOSIS — I2510 Atherosclerotic heart disease of native coronary artery without angina pectoris: Secondary | ICD-10-CM

## 2023-01-12 LAB — BASIC METABOLIC PANEL
BUN/Creatinine Ratio: 12 (ref 10–24)
BUN: 14 mg/dL (ref 8–27)
CO2: 22 mmol/L (ref 20–29)
Calcium: 8.8 mg/dL (ref 8.6–10.2)
Chloride: 104 mmol/L (ref 96–106)
Creatinine, Ser: 1.13 mg/dL (ref 0.76–1.27)
Glucose: 100 mg/dL — ABNORMAL HIGH (ref 70–99)
Potassium: 4.8 mmol/L (ref 3.5–5.2)
Sodium: 137 mmol/L (ref 134–144)
eGFR: 68 mL/min/{1.73_m2} (ref >59)

## 2023-01-18 MED ORDER — LOSARTAN POTASSIUM 50 MG PO TABS: 50.0000 mg | ORAL_TABLET | Freq: Every day | ORAL | 3 refills | Status: DC

## 2023-01-24 ENCOUNTER — Encounter: Payer: Self-pay | Admitting: Cardiology

## 2023-01-24 DIAGNOSIS — I10 Essential (primary) hypertension: Secondary | ICD-10-CM

## 2023-01-24 NOTE — Telephone Encounter (Signed)
Patient stated his blood pressure is still elevated and he still has dyspnea.

## 2023-01-25 ENCOUNTER — Telehealth: Payer: Self-pay

## 2023-01-25 MED ORDER — IRBESARTAN 300 MG PO TABS: 300.0000 mg | ORAL_TABLET | Freq: Every day | ORAL | 3 refills | Status: DC

## 2023-01-25 NOTE — Telephone Encounter (Signed)
Several Mychart messages from patient, called to clarify plan.   Patient was told to start Losartan 25 mg daily on 1/11. On 1/18, BMET was normal. On 1/24, his losartan was increased to to 50 mg daily. On 1/25, a BMET was scheduled for Feb 1. On 1/30, losartan was stopped and Avapro was started.  Today, 01/25/23, he reports he has been SOB on exertion and has had to decrease his daily walks because of it. He states that he has recently lost 80 lbs with the Healthy weight and wellness program and he is surprised he is having SOB and what he thinks is swelling in his ankles. He states that "I can barely do anything without having to stop and rest." He reports he was taken off of lasix earlier this year. Scheduled patient with DOD for 01/26/23.

## 2023-01-26 ENCOUNTER — Encounter: Payer: Self-pay | Admitting: Internal Medicine

## 2023-01-26 ENCOUNTER — Ambulatory Visit: Payer: Medicare Other | Attending: Cardiology

## 2023-01-26 ENCOUNTER — Encounter: Payer: Self-pay | Admitting: Cardiology

## 2023-01-26 ENCOUNTER — Ambulatory Visit (INDEPENDENT_AMBULATORY_CARE_PROVIDER_SITE_OTHER): Payer: Medicare Other | Admitting: Internal Medicine

## 2023-01-26 ENCOUNTER — Telehealth: Payer: Self-pay

## 2023-01-26 VITALS — BP 156/108 | HR 137 | Ht 71.0 in | Wt 237.0 lb

## 2023-01-26 DIAGNOSIS — I2510 Atherosclerotic heart disease of native coronary artery without angina pectoris: Secondary | ICD-10-CM

## 2023-01-26 DIAGNOSIS — E7800 Pure hypercholesterolemia, unspecified: Secondary | ICD-10-CM | POA: Diagnosis not present

## 2023-01-26 DIAGNOSIS — I4891 Unspecified atrial fibrillation: Secondary | ICD-10-CM

## 2023-01-26 DIAGNOSIS — I10 Essential (primary) hypertension: Secondary | ICD-10-CM

## 2023-01-26 DIAGNOSIS — Z79899 Other long term (current) drug therapy: Secondary | ICD-10-CM | POA: Diagnosis not present

## 2023-01-26 MED ORDER — METOPROLOL TARTRATE 50 MG PO TABS: 50.0000 mg | ORAL_TABLET | Freq: Three times a day (TID) | ORAL | 3 refills | Status: DC

## 2023-01-26 MED ORDER — IRBESARTAN 150 MG PO TABS: 150.0000 mg | ORAL_TABLET | Freq: Every day | ORAL | 3 refills | Status: DC

## 2023-01-26 MED ORDER — METOPROLOL TARTRATE 50 MG PO TABS: 50.0000 mg | ORAL_TABLET | Freq: Two times a day (BID) | ORAL | 3 refills | Status: DC

## 2023-01-26 MED ORDER — RIVAROXABAN 20 MG PO TABS: 20.0000 mg | ORAL_TABLET | Freq: Every day | ORAL | 3 refills | Status: DC

## 2023-01-26 NOTE — Patient Instructions (Addendum)
Medication Instructions:  Stop Aspirin Start Xarelto 20 mg a day... no missed doses Decrease Avapro to 150 mg a day Take metoprolol 50 mg two times a day   *If you need a refill on your cardiac medications before your next appointment, please call your pharmacy*   Lab Work: TSH, CBC, CMET, PRO BNP  If you have labs (blood work) drawn today and your tests are completely normal, you will receive your results only by: [place] (if you have MyChart) OR A paper copy in the mail If you have any lab test that is abnormal or we need to change your treatment, we will call you to review the results.   Testing/Procedures:  Follow-Up: IN 2 WEEKS WITH DR TURNER  At [facility], you and your health needs are our priority.  As part of our continuing mission to provide you with exceptional heart care, we have created designated Provider Care Teams.  These Care Teams include your primary Cardiologist (physician) and Advanced Practice Providers (APPs -  Physician Assistants and Nurse Practitioners) who all work together to provide you with the care you need, when you need it.  We recommend signing up for the patient portal called "MyChart".  Sign up information is provided on this After Visit Summary.  MyChart is used to connect with patients for Virtual Visits (Telemedicine).  Patients are able to view lab/test results, encounter notes, upcoming appointments, etc.  Non-urgent messages can be sent to your provider as well.   To learn more about what you can do with MyChart, go to [email or web address].

## 2023-01-26 NOTE — Telephone Encounter (Signed)
My Chart message sent to the pt and to triage to see if someone can call him tomorrow to go over the added information.

## 2023-01-26 NOTE — Telephone Encounter (Addendum)
LM for the pt to alert him that Dr Harrington Challenger prefers he takes the metoprolol 50 mg BID not TID due to Bradycardia in the past.   I set up and sent to his My Chart via a letter his Cardioversion date and time and his appt changes.  Cardioversion 02/24/23 Dr Radford Pax 02/23/23  Cancelled labs for 02/01/23 Cancelled [name] NP 02/20/23.   NEED TO TELL THE PT IF HIS HR DOES NOT COME DOWN WITHIN THE 4 WEEKS HE NEEDS TO LET us KNOW FOR POSSIBLE TEE/ CARDIOVERSION.

## 2023-01-26 NOTE — Progress Notes (Signed)
Date:  01/26/2023   ID:  Christian [name], DOB November 11, 1946, MRN [id number]  PCP:  Ramiro Harvest, PA-C  Cardiologist:  Fransico Him, MD  Electrophysiologist:  None   Evaluation Performed:  Follow-Up Visit  Chief Complaint:  CAD, HTN, orthostatic hypotension, HLD  History of Present Illness:    Christian [name] is a 77 y.o. male with CAD by cath 11/2016(mild/moderate; elevated LVEDP; myoview 2021 normal), PACs, PVCs  HTN, orthostatic hypertension, hyperlipidemia, arthritis, cataracts, sciatica who presents for follow-up of blood pressure.   Pt follows with  Christian [name]   last seen in Aug     He called in starting on 01/05/23 stating that his BP was elevated for a few wks  Some dizzines   Initially started on losartan. Advanced then switched to Avapro  The pt comes in today for evaluation.    He says in early Jan he wa in [place].   First day walked without problem   The next day he felt his heart racing   Felt bad  Legs almost gave out    Continue to have this racing sensation  Some dizziness  No syncope    came back to [place] on 01/03/23 Since then has had some SOB   Has had minor CP that he thought was indigestion.    Can'Christian get comfortable at night  Problems sleeping    Some swelling in elgs   Feeling weak  Past Medical History:  Diagnosis Date   Arthritis    "joints; shoulders; left elbow; right thumb" (05/06/2014)   Atrial tachycardia    Breathing problem    Cataracts, bilateral    immature   Chest pain    Constipation    takes Colace and Metamucil daily   Coronary artery disease 02/2011   cath 11/2016 showing 20% RCA, 50% OM, 35% mid LCx, 70% D1, 65% mid LAD, 70% distal LAD 80% on medical management   Diastolic dysfunction    GERD (gastroesophageal reflux disease)    Heart murmur    Hemorrhoids    History of bronchitis    Hypercholesterolemia    takes Crestor daily   Hypertension    Joint pain    Joint pain    Nocturia    Orthostatic hypotension    Palpitations     Premature atrial contractions    PVC's (premature ventricular contractions)    Restless leg syndrome    Rheumatoid arthritis ([place])    Sciatica    Sinus bradycardia    Past Surgical History:  Procedure Laterality Date   ACHILLES TENDON REPAIR Left    "& fixed a spur"   CARDIAC CATHETERIZATION  2012   CARDIAC CATHETERIZATION N/A 12/06/2016   Procedure: Left Heart Cath and Coronary Angiography;  Surgeon: Belva Crome, MD;  Location: [facility] [place] [facility];  Service: Cardiovascular;  Laterality: N/A;   COLONOSCOPY     FLEXIBLE SIGMOIDOSCOPY  X 2   HAND SURGERY Left    "thumb; cleaned out arthritis"   HEEL SPUR EXCISION Left    KNEE ARTHROSCOPY Right    LUMBAR LAMINECTOMY/DECOMPRESSION MICRODISCECTOMY N/A 03/30/2021   Procedure: LEFT L3 HEMILAMINECTOMY WITH EXCISION OF DISC HERNIATION;  Surgeon: Jessy Oto, MD;  Location: [facility];  Service: Orthopedics;  Laterality: N/A;   SHOULDER SURGERY Right X 2   "cleaned out spurs"   TOE FUSION Right    great toe; plates and screws   TONSILLECTOMY  ~ [place] Right 09/22/2015  Procedure: TOTAL HIP ARTHROPLASTY;  Surgeon: Garald Balding, MD;  Location: [facility];  Service: Orthopedics;  Laterality: Right;   TOTAL KNEE ARTHROPLASTY Right 05/06/2014   TOTAL KNEE ARTHROPLASTY Right 05/06/2014   Procedure: RIGHT TOTAL KNEE ARTHROPLASTY;  Surgeon: Garald Balding, MD;  Location: [facility];  Service: Orthopedics;  Laterality: Right;   TOTAL KNEE ARTHROPLASTY Left 02/05/2019   Procedure: LEFT TOTAL KNEE ARTHROPLASTY;  Surgeon: Garald Balding, MD;  Location: [facility];  Service: Orthopedics;  Laterality: Left;     Current Meds  Medication Sig   acetaminophen (TYLENOL) 500 MG tablet Take 1,000 mg by mouth every 8 (eight) hours as needed for moderate pain.   aspirin 81 MG tablet Take 81 mg by mouth daily.   cholecalciferol (VITAMIN D3) 25 MCG (1000 UNIT) tablet Take 1,000 Units by mouth daily.   COVID-19 mRNA vaccine 2023-2024 (COMIRNATY)  syringe Inject into the muscle.   docusate sodium (COLACE) 100 MG capsule Take 100 mg by mouth 3 (three) times daily.   influenza vaccine adjuvanted (FLUAD QUADRIVALENT) 0.5 ML injection Inject into the muscle.   irbesartan (AVAPRO) 300 MG tablet Take 1 tablet (300 mg total) by mouth daily.   isosorbide mononitrate (IMDUR) 60 MG 24 hr tablet TAKE 1 TABLET BY MOUTH EVERY DAY   rosuvastatin (CRESTOR) 20 MG tablet TAKE 1 TABLET BY MOUTH EVERY DAY   RSV vaccine recomb adjuvanted (AREXVY) 120 MCG/0.5ML injection Inject into the muscle.     Allergies:   Atorvastatin calcium [atorvastatin], Simvastatin, and Pravastatin   Social History   Tobacco Use   Smoking status: Former    Packs/day: 2.00    Years: 10.00    Total pack years: 20.00    Types: Cigarettes    Quit date: 7    Years since quitting: 43.1   Smokeless tobacco: Never   Tobacco comments:    quit smoking in 1981  Vaping Use   Vaping Use: Never used  Substance Use Topics   Alcohol use: Yes    Comment: "drink a beer sometimes once/month"   Drug use: No     Family Hx: The patient's family history includes CVA in his father and mother; Heart disease in his father; High Cholesterol in his father; High blood pressure in his father; Stroke in his father and mother.  ROS:   Please see the history of present illness.    All other systems reviewed and are negative.   Prior CV studies:    Most recent pertinent cardiac studies are outlined and summarized above. Reports included below if pertinent.  2D echo 09/2018 - Left ventricle: The cavity size was normal. Wall thickness was    normal. Systolic function was normal. The estimated ejection    fraction was in the range of 60% to 65%. Wall motion was normal;    there were no regional wall motion abnormalities. Doppler    parameters are consistent with abnormal left ventricular    relaxation (grade 1 diastolic dysfunction).  - Aortic valve: Mildly to moderately calcified  annulus. Mildly    thickened leaflets. There was mild regurgitation.  - Left atrium: The atrium was mildly dilated.  - Right atrium: The atrium was mildly dilated.     Labs/Other Tests and Data Reviewed:    CXK:GYJE   137 bpm  PVCs    Recent Labs: 07/18/2022: ALT 10 01/12/2023: BUN 14; Creatinine, Ser 1.13; Potassium 4.8; Sodium 137   Recent Lipid Panel Lab Results  Component Value Date/Time  CHOL 122 07/18/2022 10:12 AM   TRIG 53 07/18/2022 10:12 AM   HDL 62 07/18/2022 10:12 AM   CHOLHDL 2.4 06/15/2020 10:48 AM   CHOLHDL 2.9 04/27/2016 08:26 AM   LDLCALC 48 07/18/2022 10:12 AM    Wt Readings from Last 3 Encounters:  01/26/23 237 lb (107.5 kg)  12/13/22 214 lb (97.1 kg)  11/02/22 211 lb (95.7 kg)     Objective:    Vital Signs:  BP (!) 156/108   Pulse (!) 137   Ht '5\' 11"'$  (1.803 m)   Wt 237 lb (107.5 kg)   SpO2 95%   BMI 33.05 kg/m   BP on my check 154/100   GEN: Well nourished, well developed in no acute distress HEENT: Normal NECK: No JVD; No carotid bruit LYMPHATICS: No lymphadenopathy CARDIACIrreg irreg  No S3  No significant murmurs   RESPIRATORY:  Clear to auscultation without rales ABDOMEN: Soft, non-tender, non-distended MUSCULOSKELETAL:  tr to 1+ edema; No deformity  SKIN: Warm and dry NEUROLOGIC:  Alert and oriented x 3 PSYCHIATRIC:  Normal affect  ASSESSMENT & PLAN:    1  Afib   New  CHADSVASC score is 4   Start Xarelto   Check TSH    Cut back on ARB   Start metoprolol 50 bid IF rates better  anticoagulate for 4 wks and plan cardioversion If rates stay high then plan TEE with possible DCCV Note he has had bradycardia in past  need to follow closely  May not tolerate  1.  HTN -BP remains elevated   Changes as noted  Cut ba k on Avapro to 150  Add metoprolol 50 tid  Follow   2.  ASCAD -cath in 2017 showing mild-moderate LAD, circumflex and RCA disease without high grade obstruction. -I am not convinced of active angina   Follow    3.   PVCs -last event monitor showed PVC load of 5.5% -Follow    4.  Bradycardia  -Asymptoamtic SB in past   Follow as add b blocker   5.  HLD -I have personally reviewed and interpreted outside labs performed by patient's PCP which showed LDL 48 and HDL 62 on 07/18/2022 -Continue prescription drug management with Crestor 20 mg daily with as needed refills  6.  Orthostatic hypotension BP high now  Follow     Medication Adjustments/Labs and Tests Ordered: Current medicines are reviewed at length with the patient today.  Concerns regarding medicines are outlined above.   Follow Up: Few wks   Will notify Dr Radford Pax   Signed, Dorris Carnes, MD  01/26/2023 2:54 PM    [place]

## 2023-01-27 LAB — COMPREHENSIVE METABOLIC PANEL
ALT: 15 IU/L (ref 0–44)
AST: 23 IU/L (ref 0–40)
Albumin/Globulin Ratio: 1.8 (ref 1.2–2.2)
Albumin: 3.9 g/dL (ref 3.8–4.8)
Alkaline Phosphatase: 73 IU/L (ref 44–121)
BUN/Creatinine Ratio: 17 (ref 10–24)
BUN: 18 mg/dL (ref 8–27)
Bilirubin Total: 0.5 mg/dL (ref 0.0–1.2)
CO2: 21 mmol/L (ref 20–29)
Calcium: 9 mg/dL (ref 8.6–10.2)
Chloride: 104 mmol/L (ref 96–106)
Creatinine, Ser: 1.08 mg/dL (ref 0.76–1.27)
Globulin, Total: 2.2 g/dL (ref 1.5–4.5)
Glucose: 79 mg/dL (ref 70–99)
Potassium: 5.1 mmol/L (ref 3.5–5.2)
Sodium: 140 mmol/L (ref 134–144)
Total Protein: 6.1 g/dL (ref 6.0–8.5)
eGFR: 72 mL/min/{1.73_m2} (ref >59)

## 2023-01-27 LAB — CBC
Hematocrit: 42.4 % (ref 37.5–51.0)
Hemoglobin: 14.3 g/dL (ref 13.0–17.7)
MCH: 33.7 pg — ABNORMAL HIGH (ref 26.6–33.0)
MCHC: 33.7 g/dL (ref 31.5–35.7)
MCV: 100 fL — ABNORMAL HIGH (ref 79–97)
Platelets: 206 10*3/uL (ref 150–450)
RBC: 4.24 x10E6/uL (ref 4.14–5.80)
RDW: 12.4 % (ref 11.6–15.4)
WBC: 6.4 10*3/uL (ref 3.4–10.8)

## 2023-01-27 LAB — TSH: TSH: 4.49 u[IU]/mL (ref 0.450–4.500)

## 2023-01-27 LAB — PRO B NATRIURETIC PEPTIDE: NT-Pro BNP: 1694 pg/mL — ABNORMAL HIGH (ref 0–486)

## 2023-01-27 NOTE — Telephone Encounter (Signed)
The patient has been notified of the result and verbalized understanding.  All questions (if any) were answered. Gershon Crane, LPN 03/01/5050 0:71 AM

## 2023-01-30 ENCOUNTER — Telehealth: Payer: Self-pay

## 2023-01-30 ENCOUNTER — Encounter: Payer: Self-pay | Admitting: Cardiology

## 2023-01-30 MED ORDER — POTASSIUM CHLORIDE ER 10 MEQ PO TBCR: EXTENDED_RELEASE_TABLET | ORAL | 3 refills | Status: DC

## 2023-01-30 MED ORDER — FUROSEMIDE 40 MG PO TABS: ORAL_TABLET | ORAL | 3 refills | Status: DC

## 2023-01-30 NOTE — Telephone Encounter (Signed)
-----   Message from Dorris Carnes V, MD sent at 01/29/2023  4:47 PM EST ----- Thyrod function is normal CBC is OK Electrolytes and kidney function normal Liver function normal  Fluid is up  He is in afib  Recomm: 1.  Lasix 40 mg daily for 3 days with 10 KCL       Then every other day Lasix 40  with 10 KCL.     Check BMET and BNP in 10 day 2.  See how rates are    If still high then We can move faster to do TEE/ Cardioversoin

## 2023-01-30 NOTE — Telephone Encounter (Signed)
Pt advised his lab results and he reports that his HR has been up and down but more often running in the 100's and his BP has been elevated... he is tired and does not feel at his best per him.   I went over the option of TEE/ Cardioversion with him per Dr Alan Ripper lab note and he would like to proceed but only if it can come be done significantly sooner.   I will call Endo and then follow back up with him.   He will start the Lasix today.

## 2023-01-30 NOTE — Telephone Encounter (Signed)
Pt to have TEE Cardioversion on 02/14/23 with Dr Johney Frame to arrive 7 am.... pt to see [name] PA on 02/10/23 and to have repeat labs on this date.

## 2023-01-30 NOTE — Telephone Encounter (Signed)
See phone note

## 2023-01-31 ENCOUNTER — Encounter: Payer: Self-pay | Admitting: Cardiology

## 2023-01-31 ENCOUNTER — Ambulatory Visit (INDEPENDENT_AMBULATORY_CARE_PROVIDER_SITE_OTHER): Payer: Medicare Other | Admitting: Bariatrics

## 2023-01-31 ENCOUNTER — Encounter: Payer: Self-pay | Admitting: Bariatrics

## 2023-01-31 VITALS — BP 135/100 | HR 65 | Temp 97.8°F | Ht 71.0 in | Wt 230.0 lb

## 2023-01-31 DIAGNOSIS — R609 Edema, unspecified: Secondary | ICD-10-CM | POA: Diagnosis not present

## 2023-01-31 DIAGNOSIS — Z6832 Body mass index (BMI) 32.0-32.9, adult: Secondary | ICD-10-CM | POA: Diagnosis not present

## 2023-01-31 DIAGNOSIS — Z6831 Body mass index (BMI) 31.0-31.9, adult: Secondary | ICD-10-CM | POA: Insufficient documentation

## 2023-01-31 DIAGNOSIS — I110 Hypertensive heart disease with heart failure: Secondary | ICD-10-CM

## 2023-01-31 DIAGNOSIS — E669 Obesity, unspecified: Secondary | ICD-10-CM

## 2023-01-31 DIAGNOSIS — I10 Essential (primary) hypertension: Secondary | ICD-10-CM

## 2023-01-31 DIAGNOSIS — I5032 Chronic diastolic (congestive) heart failure: Secondary | ICD-10-CM

## 2023-02-01 ENCOUNTER — Other Ambulatory Visit: Payer: Medicare Other

## 2023-02-01 ENCOUNTER — Other Ambulatory Visit: Payer: Self-pay

## 2023-02-01 DIAGNOSIS — Z79899 Other long term (current) drug therapy: Secondary | ICD-10-CM

## 2023-02-09 NOTE — H&P (View-Only) (Signed)
Office Visit    Patient Name: Christian [name] Date of Encounter: 02/10/2023  Primary Care Provider:  Ramiro Harvest, PA-C Primary Cardiologist:  Fransico Him, MD Primary Electrophysiologist: None  Chief Complaint    Christian [name] is a 77 y.o. male with PMH of CAD (mild to moderate by [facility] 2017), HTN, HLD, orthostatic HTN, PAF (on Eliquis), PVCs who presents today for follow-up of blood pressure and atrial fibrillation.  Past Medical History    Past Medical History:  Diagnosis Date   Arthritis    "joints; shoulders; left elbow; right thumb" (05/06/2014)   Atrial tachycardia    Breathing problem    Cataracts, bilateral    immature   Chest pain    Constipation    takes Colace and Metamucil daily   Coronary artery disease 02/2011   cath 11/2016 showing 20% RCA, 50% OM, 35% mid LCx, 70% D1, 65% mid LAD, 70% distal LAD 80% on medical management   Diastolic dysfunction    GERD (gastroesophageal reflux disease)    Heart murmur    Hemorrhoids    History of bronchitis    Hypercholesterolemia    takes Crestor daily   Hypertension    Joint pain    Joint pain    Nocturia    Orthostatic hypotension    Palpitations    Premature atrial contractions    PVC's (premature ventricular contractions)    Restless leg syndrome    Rheumatoid arthritis ([place])    Sciatica    Sinus bradycardia    Past Surgical History:  Procedure Laterality Date   ACHILLES TENDON REPAIR Left    "& fixed a spur"   CARDIAC CATHETERIZATION  2012   CARDIAC CATHETERIZATION N/A 12/06/2016   Procedure: Left Heart Cath and Coronary Angiography;  Surgeon: Belva Crome, MD;  Location: [place] [facility];  Service: Cardiovascular;  Laterality: N/A;   COLONOSCOPY     FLEXIBLE SIGMOIDOSCOPY  X 2   HAND SURGERY Left    "thumb; cleaned out arthritis"   HEEL SPUR EXCISION Left    KNEE ARTHROSCOPY Right    LUMBAR LAMINECTOMY/DECOMPRESSION MICRODISCECTOMY N/A 03/30/2021   Procedure: LEFT L3 HEMILAMINECTOMY  WITH EXCISION OF DISC HERNIATION;  Surgeon: Jessy Oto, MD;  Location: [place];  Service: Orthopedics;  Laterality: N/A;   SHOULDER SURGERY Right X 2   "cleaned out spurs"   TOE FUSION Right    great toe; plates and screws   TONSILLECTOMY  ~ [place] Right 09/22/2015   Procedure: TOTAL HIP ARTHROPLASTY;  Surgeon: Garald Balding, MD;  Location: [place];  Service: Orthopedics;  Laterality: Right;   TOTAL KNEE ARTHROPLASTY Right 05/06/2014   TOTAL KNEE ARTHROPLASTY Right 05/06/2014   Procedure: RIGHT TOTAL KNEE ARTHROPLASTY;  Surgeon: Garald Balding, MD;  Location: [place];  Service: Orthopedics;  Laterality: Right;   TOTAL KNEE ARTHROPLASTY Left 02/05/2019   Procedure: LEFT TOTAL KNEE ARTHROPLASTY;  Surgeon: Garald Balding, MD;  Location: [place];  Service: Orthopedics;  Laterality: Left;    Allergies  Allergies  Allergen Reactions   Atorvastatin Calcium [Atorvastatin] Other (See Comments)    Myalgia   Simvastatin Other (See Comments)    Myalgia   Pravastatin Other (See Comments)    Leg Cramps    History of Present Illness    Christian [name]  is a 77 year old male with the above mention past medical history who presents today for follow-up of atrial  fibrillation.  Christian [name] was recently seen in 2019 by [name] for complaint of hypotension and dizziness.  Blood pressure medications were adjusted and patient was recommended to wear compression stockings.  Stress Myoview was also completed and revealed normal test with no ischemia.  2D echo was also completed showing EF of 60 to 65% with no RWMA, grade 1 DD with calcified AV are and mild AI and mild BAE.  He was seen in 05/2020 for follow-up and reported doing well with occasional stabbing pains in the chest lasting very few seconds.  Repeat event monitor shows sinus bradycardia with sinus tach and heart rate of 64 bpm and nocturnal bradycardia at 46 bpm.  He was most recently seen by [name] on 01/26/2023 for  follow-up of blood pressure.  During visit patient reported episodes of heart racing and presyncope with some shortness of breath.  EKG was completed showing AF with RVR and rate of 137 bpm.  He was started on Xarelto ARB was decreased with metoprolol 50 mg twice daily started.  Plan was initiated to DCCV in 4 weeks if rate improves with metoprolol.  If patient's rate did not improve with metoprolol we would pursue TEE with possible DCCV.  Christian [name] presents today for follow-up with his wife.  Since last being seen in the office patient reports that he is still experiencing palpitations with tiredness and shortness of breath.  EKG was obtained and patient is still in atrial fibrillation with rate of 110 bpm.  He reports full compliance with his current medication regimen and denies any adverse reactions.  He is euvolemic on exam today reports some occasional chest pain that lasted approximately 30 seconds and relieved spontaneously.  During his visit we discussed the pathophysiology of atrial fibrillation and also covered the process of undergoing TEE and DCCV.  He had all questions answered to his satisfaction.  Further discussed the importance of primary prevention and decreasing triggers for atrial fibrillation such as increased stress, caffeine, and alcohol.  Patient denies chest pain, palpitations, dyspnea, PND, orthopnea, nausea, vomiting, dizziness, syncope, edema, weight gain, or early satiety.  Home Medications    Current Outpatient Medications  Medication Sig Dispense Refill   acetaminophen (TYLENOL) 500 MG tablet Take 1,000 mg by mouth every 8 (eight) hours as needed for moderate pain.     cholecalciferol (VITAMIN D3) 25 MCG (1000 UNIT) tablet Take 1,000 Units by mouth daily.     docusate sodium (COLACE) 100 MG capsule Take 100 mg by mouth 3 (three) times daily.     furosemide (LASIX) 40 MG tablet Take one tablet (40 mg) by mouth every day for three days only then take one tablet every other  day thereafter. (Patient taking differently: Take 40 mg by mouth every other day. Take one tablet (40 mg) by mouth every other day) 90 tablet 3   irbesartan (AVAPRO) 150 MG tablet Take 1 tablet (150 mg total) by mouth daily. 90 tablet 3   potassium chloride (KLOR-CON) 10 MEQ tablet Take one tablet (10 meq) by mouth every day for three days only then take one tablet every other day thereafter. 90 tablet 3   rivaroxaban (XARELTO) 20 MG TABS tablet Take 1 tablet (20 mg total) by mouth daily with supper. 90 tablet 3   rosuvastatin (CRESTOR) 20 MG tablet TAKE 1 TABLET BY MOUTH EVERY DAY 90 tablet 3   RSV vaccine recomb adjuvanted (AREXVY) 120 MCG/0.5ML injection Inject into the muscle. 1 each 0   isosorbide  mononitrate (IMDUR) 60 MG 24 hr tablet Take 1 tablet (60 mg total) by mouth daily. Can take an additional half tab as needed for chest pain 45 tablet 1   metoprolol tartrate (LOPRESSOR) 50 MG tablet Take 2 tabs in the morning and 1 tab in the evening 90 tablet 0   No current facility-administered medications for this visit.     Review of Systems  Please see the history of present illness.    (+) Chest discomfort, shortness of breath (+) Tiredness  All other systems reviewed and are otherwise negative except as noted above.  Physical Exam    Wt Readings from Last 3 Encounters:  02/10/23 224 lb 6.4 oz (101.8 kg)  01/31/23 230 lb (104.3 kg)  01/26/23 237 lb (107.5 kg)   VS: Vitals:   02/10/23 0841  BP: 136/84  Pulse: (!) 110  SpO2: 97%  ,Body mass index is 31.3 kg/m.  Constitutional:      Appearance: Healthy appearance. Not in distress.  Neck:     Vascular: JVD normal.  Pulmonary:     Effort: Pulmonary effort is normal.     Breath sounds: No wheezing. No rales. Diminished in the bases Cardiovascular:     Irregularly irregular. Normal S1. Normal S2.      Murmurs: There is no murmur.  Edema:    Peripheral edema absent.  Abdominal:     Palpations: Abdomen is soft non tender.  There is no hepatomegaly.  Skin:    General: Skin is warm and dry.  Neurological:     General: No focal deficit present.     Mental Status: Alert and oriented to person, place and time.     Cranial Nerves: Cranial nerves are intact.  EKG/LABS/Other Studies Reviewed    ECG personally reviewed by me today -atrial fibrillation with occasional PVCs and rate of 110 bpm with no acute changes consistent with previous EKG.  Risk Assessment/Calculations:    CHA2DS2-VASc Score = 4   This indicates a 4.8% annual risk of stroke. The patient's score is based upon: CHF History: 1 HTN History: 1 Diabetes History: 0 Stroke History: 0 Vascular Disease History: 0 Age Score: 2 Gender Score: 0           Lab Results  Component Value Date   WBC 6.4 01/26/2023   HGB 14.3 01/26/2023   HCT 42.4 01/26/2023   MCV 100 (H) 01/26/2023   PLT 206 01/26/2023   Lab Results  Component Value Date   CREATININE 1.08 01/26/2023   BUN 18 01/26/2023   NA 140 01/26/2023   K 5.1 01/26/2023   CL 104 01/26/2023   CO2 21 01/26/2023   Lab Results  Component Value Date   ALT 15 01/26/2023   AST 23 01/26/2023   ALKPHOS 73 01/26/2023   BILITOT 0.5 01/26/2023   Lab Results  Component Value Date   CHOL 122 07/18/2022   HDL 62 07/18/2022   LDLCALC 48 07/18/2022   TRIG 53 07/18/2022   CHOLHDL 2.4 06/15/2020    Lab Results  Component Value Date   HGBA1C 5.1 07/18/2022    Assessment & Plan    1.  Paroxysmal atrial fibrillation: -New onset AF with RVR and patient started on metoprolol 50 mg twice daily and Xarelto 20 mg daily -Today patient reports that he is still experiencing tiredness and shortness of breath with occasional chest discomfort. -Patient will undergo TEE with possible DCCV and will have BMET checked today -We discussed the importance of abstaining  from triggers for atrial fibrillation such as caffeine, alcohol, emotional stress. -Metoprolol was increased to 100 mg morning and 50 mg in  the evening for additional rate control -CHA2DS2-VASc Score = 4 [CHF History: 1, HTN History: 1, Diabetes History: 0, Stroke History: 0, Vascular Disease History: 0, Age Score: 2, Gender Score: 0].  Therefore, the patient's annual risk of stroke is 4.8 %.       2.  Coronary artery disease: -LHC performed 2017 with mild to moderate CAD -Today patient reports occasional chest discomfort that recovers spontaneously. -He was advised to take an additional 30 mg of Imdur for chest discomfort -Continue current GDMT with metoprolol 100 mg in the a.m. and 50 mg in p.m., Crestor 20 mg -I Advised him to go to the ED if chest pain increases and is not relieved with as needed Imdur.  3.  Essential hypertension: -Patient's blood pressure today is 136/84 -Continue Avapro 150 mg daily and Lopressor as noted above -Patient was advised to continue to check blood pressures with increased dose of metoprolol for rate control.  4.  Hyperlipidemia: -Patient's last LDL was 48 on 06/2022 -Continue Crestor 20 mg daily      Disposition: Follow-up with Fransico Him, MD or APP in 2 weeks Shared Decision Making/Informed Consent The risks [stroke, cardiac arrhythmias rarely resulting in the need for a temporary or permanent pacemaker, skin irritation or burns, esophageal damage, perforation (1:10,000 risk), bleeding, pharyngeal hematoma as well as other potential complications associated with conscious sedation including aspiration, arrhythmia, respiratory failure and death], benefits (treatment guidance, restoration of normal sinus rhythm, diagnostic support) and alternatives of a transesophageal echocardiogram guided cardioversion were discussed in detail with Mr. Seeliger and he is willing to proceed.   Medication Adjustments/Labs and Tests Ordered: Current medicines are reviewed at length with the patient today.  Concerns regarding medicines are outlined above.   Signed, Mable Fill, Marissa Nestle, NP 02/10/2023, 10:02  AM [facility]  Note:  This document was prepared using Dragon voice recognition software and may include unintentional dictation errors.

## 2023-02-09 NOTE — Progress Notes (Signed)
Office Visit    Patient Name: Christian [name] Date of Encounter: 02/10/2023  Primary Care Provider:  Ramiro Harvest, PA-C Primary Cardiologist:  Fransico Him, MD Primary Electrophysiologist: None  Chief Complaint    Christian [name] is a 77 y.o. male with PMH of CAD (mild to moderate by [facility] 2017), HTN, HLD, orthostatic HTN, PAF (on Eliquis), PVCs who presents today for follow-up of blood pressure and atrial fibrillation.  Past Medical History    Past Medical History:  Diagnosis Date   Arthritis    "joints; shoulders; left elbow; right thumb" (05/06/2014)   Atrial tachycardia    Breathing problem    Cataracts, bilateral    immature   Chest pain    Constipation    takes Colace and Metamucil daily   Coronary artery disease 02/2011   cath 11/2016 showing 20% RCA, 50% OM, 35% mid LCx, 70% D1, 65% mid LAD, 70% distal LAD 80% on medical management   Diastolic dysfunction    GERD (gastroesophageal reflux disease)    Heart murmur    Hemorrhoids    History of bronchitis    Hypercholesterolemia    takes Crestor daily   Hypertension    Joint pain    Joint pain    Nocturia    Orthostatic hypotension    Palpitations    Premature atrial contractions    PVC's (premature ventricular contractions)    Restless leg syndrome    Rheumatoid arthritis ([place])    Sciatica    Sinus bradycardia    Past Surgical History:  Procedure Laterality Date   ACHILLES TENDON REPAIR Left    "& fixed a spur"   CARDIAC CATHETERIZATION  2012   CARDIAC CATHETERIZATION N/A 12/06/2016   Procedure: Left Heart Cath and Coronary Angiography;  Surgeon: Belva Crome, MD;  Location: [place] [facility];  Service: Cardiovascular;  Laterality: N/A;   COLONOSCOPY     FLEXIBLE SIGMOIDOSCOPY  X 2   HAND SURGERY Left    "thumb; cleaned out arthritis"   HEEL SPUR EXCISION Left    KNEE ARTHROSCOPY Right    LUMBAR LAMINECTOMY/DECOMPRESSION MICRODISCECTOMY N/A 03/30/2021   Procedure: LEFT L3 HEMILAMINECTOMY  WITH EXCISION OF DISC HERNIATION;  Surgeon: Jessy Oto, MD;  Location: [place];  Service: Orthopedics;  Laterality: N/A;   SHOULDER SURGERY Right X 2   "cleaned out spurs"   TOE FUSION Right    great toe; plates and screws   TONSILLECTOMY  ~ [place] Right 09/22/2015   Procedure: TOTAL HIP ARTHROPLASTY;  Surgeon: Garald Balding, MD;  Location: [place];  Service: Orthopedics;  Laterality: Right;   TOTAL KNEE ARTHROPLASTY Right 05/06/2014   TOTAL KNEE ARTHROPLASTY Right 05/06/2014   Procedure: RIGHT TOTAL KNEE ARTHROPLASTY;  Surgeon: Garald Balding, MD;  Location: [place];  Service: Orthopedics;  Laterality: Right;   TOTAL KNEE ARTHROPLASTY Left 02/05/2019   Procedure: LEFT TOTAL KNEE ARTHROPLASTY;  Surgeon: Garald Balding, MD;  Location: [place];  Service: Orthopedics;  Laterality: Left;    Allergies  Allergies  Allergen Reactions   Atorvastatin Calcium [Atorvastatin] Other (See Comments)    Myalgia   Simvastatin Other (See Comments)    Myalgia   Pravastatin Other (See Comments)    Leg Cramps    History of Present Illness    Christian [name]  is a 77 year old male with the above mention past medical history who presents today for follow-up of atrial  fibrillation.  Christian [name] was recently seen in 2019 by [name] for complaint of hypotension and dizziness.  Blood pressure medications were adjusted and patient was recommended to wear compression stockings.  Stress Myoview was also completed and revealed normal test with no ischemia.  2D echo was also completed showing EF of 60 to 65% with no RWMA, grade 1 DD with calcified AV are and mild AI and mild BAE.  He was seen in 05/2020 for follow-up and reported doing well with occasional stabbing pains in the chest lasting very few seconds.  Repeat event monitor shows sinus bradycardia with sinus tach and heart rate of 64 bpm and nocturnal bradycardia at 46 bpm.  He was most recently seen by Dr. Harrington [name] on 01/26/2023 for  follow-up of blood pressure.  During visit patient reported episodes of heart racing and presyncope with some shortness of breath.  EKG was completed showing AF with RVR and rate of 137 bpm.  He was started on Xarelto ARB was decreased with metoprolol 50 mg twice daily started.  Plan was initiated to DCCV in 4 weeks if rate improves with metoprolol.  If patient's rate did not improve with metoprolol we would pursue TEE with possible DCCV.  Christian [name] presents today for follow-up with his wife.  Since last being seen in the office patient reports that he is still experiencing palpitations with tiredness and shortness of breath.  EKG was obtained and patient is still in atrial fibrillation with rate of 110 bpm.  He reports full compliance with his current medication regimen and denies any adverse reactions.  He is euvolemic on exam today reports some occasional chest pain that lasted approximately 30 seconds and relieved spontaneously.  During his visit we discussed the pathophysiology of atrial fibrillation and also covered the process of undergoing TEE and DCCV.  He had all questions answered to his satisfaction.  Further discussed the importance of primary prevention and decreasing triggers for atrial fibrillation such as increased stress, caffeine, and alcohol.  Patient denies chest pain, palpitations, dyspnea, PND, orthopnea, nausea, vomiting, dizziness, syncope, edema, weight gain, or early satiety.  Home Medications    Current Outpatient Medications  Medication Sig Dispense Refill   acetaminophen (TYLENOL) 500 MG tablet Take 1,000 mg by mouth every 8 (eight) hours as needed for moderate pain.     cholecalciferol (VITAMIN D3) 25 MCG (1000 UNIT) tablet Take 1,000 Units by mouth daily.     docusate sodium (COLACE) 100 MG capsule Take 100 mg by mouth 3 (three) times daily.     furosemide (LASIX) 40 MG tablet Take one tablet (40 mg) by mouth every day for three days only then take one tablet every other  day thereafter. (Patient taking differently: Take 40 mg by mouth every other day. Take one tablet (40 mg) by mouth every other day) 90 tablet 3   irbesartan (AVAPRO) 150 MG tablet Take 1 tablet (150 mg total) by mouth daily. 90 tablet 3   potassium chloride (KLOR-CON) 10 MEQ tablet Take one tablet (10 meq) by mouth every day for three days only then take one tablet every other day thereafter. 90 tablet 3   rivaroxaban (XARELTO) 20 MG TABS tablet Take 1 tablet (20 mg total) by mouth daily with supper. 90 tablet 3   rosuvastatin (CRESTOR) 20 MG tablet TAKE 1 TABLET BY MOUTH EVERY DAY 90 tablet 3   RSV vaccine recomb adjuvanted (AREXVY) 120 MCG/0.5ML injection Inject into the muscle. 1 each 0   isosorbide  mononitrate (IMDUR) 60 MG 24 hr tablet Take 1 tablet (60 mg total) by mouth daily. Can take an additional half tab as needed for chest pain 45 tablet 1   metoprolol tartrate (LOPRESSOR) 50 MG tablet Take 2 tabs in the morning and 1 tab in the evening 90 tablet 0   No current facility-administered medications for this visit.     Review of Systems  Please see the history of present illness.    (+) Chest discomfort, shortness of breath (+) Tiredness  All other systems reviewed and are otherwise negative except as noted above.  Physical Exam    Wt Readings from Last 3 Encounters:  02/10/23 224 lb 6.4 oz (101.8 kg)  01/31/23 230 lb (104.3 kg)  01/26/23 237 lb (107.5 kg)   VS: Vitals:   02/10/23 0841  BP: 136/84  Pulse: (!) 110  SpO2: 97%  ,Body mass index is 31.3 kg/m.  Constitutional:      Appearance: Healthy appearance. Not in distress.  Neck:     Vascular: JVD normal.  Pulmonary:     Effort: Pulmonary effort is normal.     Breath sounds: No wheezing. No rales. Diminished in the bases Cardiovascular:     Irregularly irregular. Normal S1. Normal S2.      Murmurs: There is no murmur.  Edema:    Peripheral edema absent.  Abdominal:     Palpations: Abdomen is soft non tender.  There is no hepatomegaly.  Skin:    General: Skin is warm and dry.  Neurological:     General: No focal deficit present.     Mental Status: Alert and oriented to person, place and time.     Cranial Nerves: Cranial nerves are intact.  EKG/LABS/Other Studies Reviewed    ECG personally reviewed by me today -atrial fibrillation with occasional PVCs and rate of 110 bpm with no acute changes consistent with previous EKG.  Risk Assessment/Calculations:    CHA2DS2-VASc Score = 4   This indicates a 4.8% annual risk of stroke. The patient's score is based upon: CHF History: 1 HTN History: 1 Diabetes History: 0 Stroke History: 0 Vascular Disease History: 0 Age Score: 2 Gender Score: 0           Lab Results  Component Value Date   WBC 6.4 01/26/2023   HGB 14.3 01/26/2023   HCT 42.4 01/26/2023   MCV 100 (H) 01/26/2023   PLT 206 01/26/2023   Lab Results  Component Value Date   CREATININE 1.08 01/26/2023   BUN 18 01/26/2023   NA 140 01/26/2023   K 5.1 01/26/2023   CL 104 01/26/2023   CO2 21 01/26/2023   Lab Results  Component Value Date   ALT 15 01/26/2023   AST 23 01/26/2023   ALKPHOS 73 01/26/2023   BILITOT 0.5 01/26/2023   Lab Results  Component Value Date   CHOL 122 07/18/2022   HDL 62 07/18/2022   LDLCALC 48 07/18/2022   TRIG 53 07/18/2022   CHOLHDL 2.4 06/15/2020    Lab Results  Component Value Date   HGBA1C 5.1 07/18/2022    Assessment & Plan    1.  Paroxysmal atrial fibrillation: -New onset AF with RVR and patient started on metoprolol 50 mg twice daily and Xarelto 20 mg daily -Today patient reports that he is still experiencing tiredness and shortness of breath with occasional chest discomfort. -Patient will undergo TEE with possible DCCV and will have BMET checked today -We discussed the importance of abstaining  from triggers for atrial fibrillation such as caffeine, alcohol, emotional stress. -Metoprolol was increased to 100 mg morning and 50 mg in  the evening for additional rate control -CHA2DS2-VASc Score = 4 [CHF History: 1, HTN History: 1, Diabetes History: 0, Stroke History: 0, Vascular Disease History: 0, Age Score: 2, Gender Score: 0].  Therefore, the patient's annual risk of stroke is 4.8 %.       2.  Coronary artery disease: -LHC performed 2017 with mild to moderate CAD -Today patient reports occasional chest discomfort that recovers spontaneously. -He was advised to take an additional 30 mg of Imdur for chest discomfort -Continue current GDMT with metoprolol 100 mg in the a.m. and 50 mg in p.m., Crestor 20 mg -I Advised him to go to the ED if chest pain increases and is not relieved with as needed Imdur.  3.  Essential hypertension: -Patient's blood pressure today is 136/84 -Continue Avapro 150 mg daily and Lopressor as noted above -Patient was advised to continue to check blood pressures with increased dose of metoprolol for rate control.  4.  Hyperlipidemia: -Patient's last LDL was 48 on 06/2022 -Continue Crestor 20 mg daily      Disposition: Follow-up with Fransico Him, MD or APP in 2 weeks Shared Decision Making/Informed Consent The risks [stroke, cardiac arrhythmias rarely resulting in the need for a temporary or permanent pacemaker, skin irritation or burns, esophageal damage, perforation (1:10,000 risk), bleeding, pharyngeal hematoma as well as other potential complications associated with conscious sedation including aspiration, arrhythmia, respiratory failure and death], benefits (treatment guidance, restoration of normal sinus rhythm, diagnostic support) and alternatives of a transesophageal echocardiogram guided cardioversion were discussed in detail with Christian [name] and he is willing to proceed.   Medication Adjustments/Labs and Tests Ordered: Current medicines are reviewed at length with the patient today.  Concerns regarding medicines are outlined above.   Signed, Mable Fill, Marissa Nestle, NP 02/10/2023, 10:02  AM [facility]  Note:  This document was prepared using Dragon voice recognition software and may include unintentional dictation errors.

## 2023-02-10 ENCOUNTER — Encounter: Payer: Self-pay | Admitting: Nurse Practitioner

## 2023-02-10 ENCOUNTER — Ambulatory Visit: Payer: Medicare Other | Admitting: Physician Assistant

## 2023-02-10 ENCOUNTER — Ambulatory Visit: Payer: Medicare Other

## 2023-02-10 ENCOUNTER — Ambulatory Visit: Payer: Medicare Other | Admitting: Cardiology

## 2023-02-10 ENCOUNTER — Ambulatory Visit: Payer: Medicare Other | Attending: Nurse Practitioner | Admitting: Nurse Practitioner

## 2023-02-10 VITALS — BP 136/84 | HR 110 | Ht 71.0 in | Wt 224.4 lb

## 2023-02-10 DIAGNOSIS — I10 Essential (primary) hypertension: Secondary | ICD-10-CM

## 2023-02-10 DIAGNOSIS — I4891 Unspecified atrial fibrillation: Secondary | ICD-10-CM | POA: Diagnosis not present

## 2023-02-10 DIAGNOSIS — Z79899 Other long term (current) drug therapy: Secondary | ICD-10-CM | POA: Insufficient documentation

## 2023-02-10 DIAGNOSIS — E7800 Pure hypercholesterolemia, unspecified: Secondary | ICD-10-CM | POA: Diagnosis not present

## 2023-02-10 DIAGNOSIS — I2510 Atherosclerotic heart disease of native coronary artery without angina pectoris: Secondary | ICD-10-CM | POA: Diagnosis not present

## 2023-02-10 MED ORDER — ISOSORBIDE MONONITRATE ER 60 MG PO TB24: 60.0000 mg | ORAL_TABLET | Freq: Every day | ORAL | 1 refills | Status: DC

## 2023-02-10 MED ORDER — METOPROLOL TARTRATE 50 MG PO TABS: ORAL_TABLET | ORAL | 0 refills | Status: DC

## 2023-02-10 NOTE — Patient Instructions (Addendum)
Medication Instructions:  CHANGE Imdur you can take an additional 37m tablet as needed for chest pain INCREASE Metoprolol to 1019min the morning and 5031mn the evening *If you need a refill on your cardiac medications before your next appointment, please call your pharmacy*   Lab Work: Today-BMET   Testing/Procedures: None ordered   Follow-Up: [facility] are our [facility].  As part of our continuing mission to provide you with exceptional heart care, we have created designated Provider Care Teams.  These Care Teams include your primary Cardiologist (physician) and Advanced Practice Providers (APPs -  Physician Assistants and Nurse Practitioners) who all work together to provide you with the care you need, when you need it.  We recommend signing up for the patient portal called "MyChart".  Sign up information is provided on this After Visit Summary.  MyChart is used to connect with patients for Virtual Visits (Telemedicine).  Patients are able to view lab/test results, encounter notes, upcoming appointments, etc.  Non-urgent messages can be sent to your provider as well.   To learn more about what you can do with MyChart, go to [email or web address]  Your next appointment:   2 week(s)  Provider:   ErnAmbrose PancoastP       Other Instructions      Dear Christian Harnessou are scheduled for a Cardioversion on Tuesday, February 20 with Dr. HeaGwyndolyn KaufmanPlease arrive [facility] [id number] 7:00 AM.    DIET:  Nothing to eat or drink after midnight except a sip of water with medications (see medication instructions below)  MEDICATION INSTRUCTIONS: Continue taking your anticoagulant (blood thinner): Rivaroxaban (Xarelto).  You will need to continue this after your procedure until you are told by your provider that it is safe to stop.    LABS:   BMET  TODAY  FYI:  For your safety, and to allow us [name] monitor your vital signs accurately during the surgery/procedure we request: If you have artificial nails, gel coating, SNS etc, please have those removed prior to your surgery/procedure. Not having the nail coverings /polish removed may result in cancellation or delay of your surgery/procedure.  You must have a responsible person to drive you home and stay in the waiting area during your procedure. Failure to do so could result in cancellation.  Bring your insurance cards.  *Special Note: Every effort is made to have your procedure done on time. Occasionally there are emergencies that occur at the hospital that may cause delays. Please be patient if a delay does occur.

## 2023-02-11 LAB — BASIC METABOLIC PANEL
BUN/Creatinine Ratio: 17 (ref 10–24)
BUN: 18 mg/dL (ref 8–27)
CO2: 23 mmol/L (ref 20–29)
Calcium: 9.1 mg/dL (ref 8.6–10.2)
Chloride: 102 mmol/L (ref 96–106)
Creatinine, Ser: 1.06 mg/dL (ref 0.76–1.27)
Glucose: 85 mg/dL (ref 70–99)
Potassium: 4.7 mmol/L (ref 3.5–5.2)
Sodium: 139 mmol/L (ref 134–144)
eGFR: 73 mL/min/{1.73_m2} (ref >59)

## 2023-02-13 NOTE — Progress Notes (Unsigned)
Chief Complaint:   OBESITY Christian [name] is here to discuss his progress with his obesity treatment plan along with follow-up of his obesity related diagnoses. [name] is on the Category 1 Plan and states he is following his eating plan approximately 95% of the time. Derelle states he is not currently exercising.  Today's visit was #: 56 Starting weight: 278 lbs Starting date: 06/24/2020 Today's weight: 230 lbs Today's date: 01/31/2023 Total lbs lost to date: 48 Total lbs lost since last in-office visit: +16  Interim History: Christian [name] is up 16 lbs of weight. His water weight is up 16 lbs, per the bioimpedance. He has Afib and is working with his PCP. He was taken off his diuretic, but resumed.   Subjective:   1. Retention of fluid [name] is taking Lasix. Right > Left (trace, 1+ edema)  2. Chronic diastolic heart failure (HCC) Weight is up 16 lbs since his last visit. Pt is unable to see cardiologist. He resumed Lasix yesterday.  [name] saw [name] was put on Lopressor, low heart rate, diagnosed Afib. Scheduled for TEE and shock on 02/14/2023. Pt is not checking his weight at home. He was put on Xarelto and taken off aspirin. SOB started in [place] the 1st of January (SOB is about the dame as usual). Weight was 237 lbs at the cardiologist several weeks ago.  3. Essential hypertension BP 135/100 Christian [name] is taking Lasix, Avapro, Imdur, metoprolol, and Xarelto.  Assessment/Plan:   1. Retention of fluid Continue Lasix Will weigh at home daily and call PCP often.  2. Chronic diastolic heart failure (HCC) 1+ edema extremities R>L Heart irregular Lungs are clear  3. Essential hypertension Continue medications. No added salt.  4. Generalized obesity 5. BMI 32.0-32.9,adult Will follow up with PCP if needed. Will not stop the Lasix. Avoid dehydration.  Christian [name] is currently in the action stage of change. As such, his goal is to continue with weight loss efforts. He has  agreed to the Category 1 Plan.   Exercise goals:  As is  Behavioral modification strategies: increasing lean protein intake, decreasing simple carbohydrates, increasing vegetables, increasing water intake, decreasing eating out, no skipping meals, meal planning and cooking strategies, keeping healthy foods in the home, and planning for success.  Christian [name] has agreed to follow-up with our clinic in 2 weeks. He was informed of the importance of frequent follow-up visits to maximize his success with intensive lifestyle modifications for his multiple health conditions.   Objective:   Blood pressure (!) 135/100, pulse 65, temperature 97.8 F (36.6 C), height 5' 11"$  (1.803 m), weight 230 lb (104.3 kg), SpO2 98 %. Body mass index is 32.08 kg/m.  General: Cooperative, alert, well developed, in no acute distress. HEENT: Conjunctivae and lids unremarkable. Cardiovascular: Regular rhythm.  Lungs: Normal work of breathing. Neurologic: No focal deficits.   Lab Results  Component Value Date   CREATININE 1.06 02/10/2023   BUN 18 02/10/2023   NA 139 02/10/2023   K 4.7 02/10/2023   CL 102 02/10/2023   CO2 23 02/10/2023   Lab Results  Component Value Date   ALT 15 01/26/2023   AST 23 01/26/2023   ALKPHOS 73 01/26/2023   BILITOT 0.5 01/26/2023   Lab Results  Component Value Date   HGBA1C 5.1 07/18/2022   HGBA1C 5.3 09/28/2021   HGBA1C 5.4 06/24/2020   Lab Results  Component Value Date   INSULIN 5.4 07/18/2022   INSULIN 5.1 09/28/2021   INSULIN 4.4 01/26/2021  INSULIN 6.8 10/05/2020   INSULIN 12.1 06/24/2020   Lab Results  Component Value Date   TSH 4.490 01/26/2023   Lab Results  Component Value Date   CHOL 122 07/18/2022   HDL 62 07/18/2022   LDLCALC 48 07/18/2022   TRIG 53 07/18/2022   CHOLHDL 2.4 06/15/2020   Lab Results  Component Value Date   VD25OH 59.4 07/18/2022   VD25OH 58.9 09/28/2021   VD25OH 36.6 01/26/2021   Lab Results  Component Value Date   WBC 6.4  01/26/2023   HGB 14.3 01/26/2023   HCT 42.4 01/26/2023   MCV 100 (H) 01/26/2023   PLT 206 01/26/2023    Attestation Statements:   Reviewed by clinician on day of visit: allergies, medications, problem list, medical history, surgical history, family history, social history, and previous encounter notes.  I, Kathlene November, BS, CMA, am acting as transcriptionist for [employer], DO.  I have reviewed the above documentation for accuracy and completeness, and I agree with the above. Jearld Lesch, DO

## 2023-02-14 ENCOUNTER — Ambulatory Visit (HOSPITAL_BASED_OUTPATIENT_CLINIC_OR_DEPARTMENT_OTHER): Payer: Medicare Other | Admitting: Anesthesiology

## 2023-02-14 ENCOUNTER — Ambulatory Visit (HOSPITAL_BASED_OUTPATIENT_CLINIC_OR_DEPARTMENT_OTHER)
Admission: RE | Admit: 2023-02-14 | Discharge: 2023-02-14 | Disposition: A | Discharge status: Home / Self Care | Payer: Medicare Other | Source: Ambulatory Visit | Attending: Internal Medicine | Admitting: Internal Medicine

## 2023-02-14 ENCOUNTER — Other Ambulatory Visit: Payer: Self-pay

## 2023-02-14 ENCOUNTER — Telehealth: Payer: Self-pay

## 2023-02-14 ENCOUNTER — Ambulatory Visit (HOSPITAL_COMMUNITY)
Admission: RE | Admit: 2023-02-14 | Discharge: 2023-02-14 | Disposition: A | Discharge status: Home / Self Care | Payer: Medicare Other | Attending: Cardiology | Admitting: Cardiology

## 2023-02-14 ENCOUNTER — Ambulatory Visit (HOSPITAL_COMMUNITY): Payer: Medicare Other | Admitting: Anesthesiology

## 2023-02-14 ENCOUNTER — Encounter: Payer: Self-pay | Admitting: Bariatrics

## 2023-02-14 ENCOUNTER — Encounter (HOSPITAL_COMMUNITY)
Admission: RE | Disposition: A | Discharge status: Home / Self Care | Payer: Self-pay | Source: Home / Self Care | Attending: Cardiology

## 2023-02-14 DIAGNOSIS — E785 Hyperlipidemia, unspecified: Secondary | ICD-10-CM | POA: Insufficient documentation

## 2023-02-14 DIAGNOSIS — I4891 Unspecified atrial fibrillation: Secondary | ICD-10-CM

## 2023-02-14 DIAGNOSIS — I083 Combined rheumatic disorders of mitral, aortic and tricuspid valves: Secondary | ICD-10-CM | POA: Diagnosis not present

## 2023-02-14 DIAGNOSIS — I2510 Atherosclerotic heart disease of native coronary artery without angina pectoris: Secondary | ICD-10-CM

## 2023-02-14 DIAGNOSIS — Z7901 Long term (current) use of anticoagulants: Secondary | ICD-10-CM | POA: Insufficient documentation

## 2023-02-14 DIAGNOSIS — I080 Rheumatic disorders of both mitral and aortic valves: Secondary | ICD-10-CM | POA: Insufficient documentation

## 2023-02-14 DIAGNOSIS — Z79899 Other long term (current) drug therapy: Secondary | ICD-10-CM | POA: Diagnosis not present

## 2023-02-14 DIAGNOSIS — I480 Paroxysmal atrial fibrillation: Secondary | ICD-10-CM | POA: Diagnosis not present

## 2023-02-14 DIAGNOSIS — I4819 Other persistent atrial fibrillation: Secondary | ICD-10-CM | POA: Diagnosis not present

## 2023-02-14 DIAGNOSIS — I10 Essential (primary) hypertension: Secondary | ICD-10-CM | POA: Diagnosis not present

## 2023-02-14 DIAGNOSIS — Z87891 Personal history of nicotine dependence: Secondary | ICD-10-CM | POA: Diagnosis not present

## 2023-02-14 DIAGNOSIS — I351 Nonrheumatic aortic (valve) insufficiency: Secondary | ICD-10-CM | POA: Diagnosis not present

## 2023-02-14 DIAGNOSIS — I340 Nonrheumatic mitral (valve) insufficiency: Secondary | ICD-10-CM

## 2023-02-14 HISTORY — PX: TEE WITHOUT CARDIOVERSION: SHX5443

## 2023-02-14 HISTORY — PX: CARDIOVERSION: SHX1299

## 2023-02-14 LAB — ECHO TEE

## 2023-02-14 SURGERY — ECHOCARDIOGRAM, TRANSESOPHAGEAL
Anesthesia: General

## 2023-02-14 MED ORDER — BUTAMBEN-TETRACAINE-BENZOCAINE 2-2-14 % EX AERO
INHALATION_SPRAY | CUTANEOUS | Status: DC | PRN
  Administered 2023-02-14: 1 via TOPICAL

## 2023-02-14 MED ORDER — PROPOFOL 10 MG/ML IV BOLUS
INTRAVENOUS | Status: DC | PRN
  Administered 2023-02-14: 20 mg via INTRAVENOUS

## 2023-02-14 MED ORDER — LIDOCAINE 2% (20 MG/ML) 5 ML SYRINGE
INTRAMUSCULAR | Status: DC | PRN
  Administered 2023-02-14: 30 mg via INTRAVENOUS

## 2023-02-14 MED ORDER — SODIUM CHLORIDE 0.9 % IV SOLN: INTRAVENOUS | Status: DC

## 2023-02-14 MED ORDER — PROPOFOL 500 MG/50ML IV EMUL
INTRAVENOUS | Status: DC | PRN
  Administered 2023-02-14: 100 ug/kg/min via INTRAVENOUS

## 2023-02-14 NOTE — Anesthesia Preprocedure Evaluation (Signed)
Anesthesia Evaluation  Patient identified by MRN, date of birth, ID band Patient awake    Reviewed: Allergy & Precautions, H&P , NPO status , Patient's Chart, lab work & pertinent test results  Airway Mallampati: II  TM Distance: >3 FB Neck ROM: Full    Dental no notable dental hx.    Pulmonary neg pulmonary ROS, former smoker   Pulmonary exam normal breath sounds clear to auscultation       Cardiovascular hypertension, + CAD  Normal cardiovascular exam+ dysrhythmias Atrial Fibrillation  Rhythm:Irregular Rate:Normal     Neuro/Psych negative neurological ROS  negative psych ROS   GI/Hepatic negative GI ROS, Neg liver ROS,,,  Endo/Other  negative endocrine ROS    Renal/GU negative Renal ROS  negative genitourinary   Musculoskeletal  (+) Arthritis , Rheumatoid disorders,    Abdominal   Peds negative pediatric ROS (+)  Hematology negative hematology ROS (+)   Anesthesia Other Findings   Reproductive/Obstetrics negative OB ROS                             Anesthesia Physical Anesthesia Plan  ASA: 3  Anesthesia Plan: General   Post-op Pain Management: Minimal or no pain anticipated   Induction: Intravenous  PONV Risk Score and Plan: 2 and Treatment may vary due to age or medical condition  Airway Management Planned: Mask  Additional Equipment:   Intra-op Plan:   Post-operative Plan:   Informed Consent: I have reviewed the patients History and Physical, chart, labs and discussed the procedure including the risks, benefits and alternatives for the proposed anesthesia with the patient or authorized representative who has indicated his/her understanding and acceptance.     Dental advisory given  Plan Discussed with: CRNA and Surgeon  Anesthesia Plan Comments:        Anesthesia Quick Evaluation

## 2023-02-14 NOTE — Telephone Encounter (Signed)
-----   Message from Sueanne Margarita, MD sent at 02/11/2023  9:01 AM EST ----- Please let patient know that labs were normal.  Continue current medical therapy.

## 2023-02-14 NOTE — CV Procedure (Signed)
     Transesophageal Echocardiogram Note  DEMECO YAGI [id number] 02-24-1946  Procedure: Transesophageal Echocardiogram Indications: AFib  Procedure Details Consent: Obtained Time Out: Verified patient identification, verified procedure, site/side was marked, verified correct patient position, special equipment/implants available, Radiology Safety Procedures followed,  medications/allergies/relevent history reviewed, required imaging and test results available.  Performed  Medications: Propofol: 298m  Left Ventrical:  LVEF 60-65%  Mitral Valve: Normal structure. Mild to moderate MR  Aortic Valve: Tricuspid, mild AR  Tricuspid Valve: Normal structure. Trivial TR  Pulmonic Valve: Normal structure. No PI  Left Atrium/ Left atrial appendage: Mild LAE. No evidence of LAA thrombus  Atrial septum: No shunting seen  Aorta: Mild plaquing   Complications: No apparent complications Patient did tolerate procedure well.  Following the TEE, the patient underwent successful DCCV with 150J with return to sinus bradycardia.   HFreada Bergeron MD 02/14/2023, 9:00 AM

## 2023-02-14 NOTE — Transfer of Care (Signed)
Immediate Anesthesia Transfer of Care Note  Patient: Christian [name]  Procedure(s) Performed: TRANSESOPHAGEAL ECHOCARDIOGRAM (TEE) CARDIOVERSION  Patient Location: Endoscopy Unit  Anesthesia Type:MAC  Level of Consciousness: drowsy  Airway & Oxygen Therapy: Patient Spontanous Breathing and Patient connected to nasal cannula oxygen  Post-op Assessment: Report given to RN and Post -op Vital signs reviewed and stable  Post vital signs: Reviewed and stable  Last Vitals:  Vitals Value Taken Time  BP    Temp    Pulse 49 02/14/23 0909  Resp 15 02/14/23 0909  SpO2 96 % 02/14/23 0909  Vitals shown include unvalidated device data.  Last Pain:  Vitals:   02/14/23 0716  TempSrc: Temporal  PainSc: 0-No pain         Complications: No notable events documented.

## 2023-02-14 NOTE — Telephone Encounter (Signed)
Results reviewed with patient who verbalizes understanding of normal results and to continue current medications.

## 2023-02-14 NOTE — Anesthesia Postprocedure Evaluation (Signed)
Anesthesia Post Note  Patient: Christian [name]  Procedure(s) Performed: TRANSESOPHAGEAL ECHOCARDIOGRAM (TEE) CARDIOVERSION     Patient location during evaluation: PACU Anesthesia Type: General Level of consciousness: awake and alert Pain management: pain level controlled Vital Signs Assessment: post-procedure vital signs reviewed and stable Respiratory status: spontaneous breathing, nonlabored ventilation, respiratory function stable and patient connected to nasal cannula oxygen Cardiovascular status: blood pressure returned to baseline and stable Postop Assessment: no apparent nausea or vomiting Anesthetic complications: no  No notable events documented.  Last Vitals:  Vitals:   02/14/23 0913 02/14/23 0920  BP: 91/74 99/82  Pulse: (!) 50 (!) 50  Resp: 19 20  Temp:    SpO2: 95% 94%    Last Pain:  Vitals:   02/14/23 0920  TempSrc:   PainSc: 0-No pain                 Kenlyn Lose S

## 2023-02-14 NOTE — Progress Notes (Signed)
  Echocardiogram Echocardiogram Transesophageal has been performed.  Christian [name] 02/14/2023, 9:12 AM

## 2023-02-14 NOTE — Anesthesia Procedure Notes (Signed)
Procedure Name: MAC Date/Time: 02/14/2023 8:47 AM  Performed by: Eligha Bridegroom, CRNAPre-anesthesia Checklist: Patient identified, Emergency Drugs available, Suction available, Patient being monitored and Timeout performed Patient Re-evaluated:Patient Re-evaluated prior to induction Oxygen Delivery Method: Nasal cannula Preoxygenation: Pre-oxygenation with 100% oxygen Induction Type: IV induction

## 2023-02-14 NOTE — CV Procedure (Signed)
Procedure: Electrical Cardioversion Indications:  Atrial Fibrillation  Procedure Details:  Consent: Risks of procedure as well as the alternatives and risks of each were explained to the (patient/caregiver).  Consent for procedure obtained.  Time Out: Verified patient identification, verified procedure, site/side was marked, verified correct patient position, special equipment/implants available, medications/allergies/relevent history reviewed, required imaging and test results available. PERFORMED.  Patient placed on cardiac monitor, pulse oximetry, supplemental oxygen as necessary.  Sedation given:  Propofol: 298m Pacer pads placed anterior and posterior chest.  Cardioverted 1 time(s).  Cardioversion with synchronized biphasic 150J shock.  Evaluation: Findings: Post procedure EKG shows:  Sinus bradycardia Complications: None Patient did tolerate procedure well.  Time Spent Directly with the Patient:  491mutes   Christian [name]/20/2024, 9:03 AM

## 2023-02-14 NOTE — Interval H&P Note (Signed)
History and Physical Interval Note:  02/14/2023 7:38 AM  Christian [name]  has presented today for surgery, with the diagnosis of atrial fibrillation.  The various methods of treatment have been discussed with the patient and family. After consideration of risks, benefits and other options for treatment, the patient has consented to  Procedure(s): TRANSESOPHAGEAL ECHOCARDIOGRAM (TEE) (N/A) CARDIOVERSION (N/A) as a surgical intervention.  The patient's history has been reviewed, patient examined, no change in status, stable for surgery.  I have reviewed the patient's chart and labs.  Questions were answered to the patient's satisfaction.     Freada Bergeron

## 2023-02-15 ENCOUNTER — Encounter (HOSPITAL_COMMUNITY): Payer: Self-pay | Admitting: Cardiology

## 2023-02-16 ENCOUNTER — Ambulatory Visit (INDEPENDENT_AMBULATORY_CARE_PROVIDER_SITE_OTHER): Payer: Medicare Other | Admitting: Bariatrics

## 2023-02-16 ENCOUNTER — Encounter: Payer: Self-pay | Admitting: Bariatrics

## 2023-02-16 VITALS — BP 135/87 | HR 64 | Temp 97.6°F | Ht 71.0 in | Wt 223.0 lb

## 2023-02-16 DIAGNOSIS — I5032 Chronic diastolic (congestive) heart failure: Secondary | ICD-10-CM | POA: Diagnosis not present

## 2023-02-16 DIAGNOSIS — E669 Obesity, unspecified: Secondary | ICD-10-CM

## 2023-02-16 DIAGNOSIS — R5383 Other fatigue: Secondary | ICD-10-CM | POA: Diagnosis not present

## 2023-02-16 DIAGNOSIS — Z6831 Body mass index (BMI) 31.0-31.9, adult: Secondary | ICD-10-CM | POA: Diagnosis not present

## 2023-02-20 ENCOUNTER — Ambulatory Visit: Payer: Medicare Other | Admitting: Nurse Practitioner

## 2023-02-22 DIAGNOSIS — L820 Inflamed seborrheic keratosis: Secondary | ICD-10-CM | POA: Diagnosis not present

## 2023-02-22 DIAGNOSIS — L821 Other seborrheic keratosis: Secondary | ICD-10-CM | POA: Diagnosis not present

## 2023-02-22 DIAGNOSIS — D2271 Melanocytic nevi of right lower limb, including hip: Secondary | ICD-10-CM | POA: Diagnosis not present

## 2023-02-22 DIAGNOSIS — L578 Other skin changes due to chronic exposure to nonionizing radiation: Secondary | ICD-10-CM | POA: Diagnosis not present

## 2023-02-22 DIAGNOSIS — D044 Carcinoma in situ of skin of scalp and neck: Secondary | ICD-10-CM | POA: Diagnosis not present

## 2023-02-22 DIAGNOSIS — D485 Neoplasm of uncertain behavior of skin: Secondary | ICD-10-CM | POA: Diagnosis not present

## 2023-02-22 DIAGNOSIS — Z85828 Personal history of other malignant neoplasm of skin: Secondary | ICD-10-CM | POA: Diagnosis not present

## 2023-02-22 DIAGNOSIS — L570 Actinic keratosis: Secondary | ICD-10-CM | POA: Diagnosis not present

## 2023-02-22 DIAGNOSIS — D225 Melanocytic nevi of trunk: Secondary | ICD-10-CM | POA: Diagnosis not present

## 2023-02-22 NOTE — Progress Notes (Signed)
Office Visit    Patient Name: Christian [name] Date of Encounter: 02/22/2023  Primary Care Provider:  Ramiro Harvest, PA-C Primary Cardiologist:  Fransico Him, MD Primary Electrophysiologist: None  Chief Complaint    Christian [name] is a 77 y.o. male with PMH of CAD (mild to moderate by [facility] 2017), HTN, HLD, orthostatic HTN, PAF (on Eliquis), PVCs who presents today for follow-up of recent DCCV.  Past Medical History    Past Medical History:  Diagnosis Date   Arthritis    "joints; shoulders; left elbow; right thumb" (05/06/2014)   Atrial tachycardia    Breathing problem    Cataracts, bilateral    immature   Chest pain    Constipation    takes Colace and Metamucil daily   Coronary artery disease 02/2011   cath 11/2016 showing 20% RCA, 50% OM, 35% mid LCx, 70% D1, 65% mid LAD, 70% distal LAD 80% on medical management   Diastolic dysfunction    GERD (gastroesophageal reflux disease)    Heart murmur    Hemorrhoids    History of bronchitis    Hypercholesterolemia    takes Crestor daily   Hypertension    Joint pain    Joint pain    Nocturia    Orthostatic hypotension    Palpitations    Premature atrial contractions    PVC's (premature ventricular contractions)    Restless leg syndrome    Rheumatoid arthritis ([place])    Sciatica    Sinus bradycardia    Past Surgical History:  Procedure Laterality Date   ACHILLES TENDON REPAIR Left    "& fixed a spur"   CARDIAC CATHETERIZATION  2012   CARDIAC CATHETERIZATION N/A 12/06/2016   Procedure: Left Heart Cath and Coronary Angiography;  Surgeon: Belva Crome, MD;  Location: [place] CV LAB;  Service: Cardiovascular;  Laterality: N/A;   CARDIOVERSION N/A 02/14/2023   Procedure: CARDIOVERSION;  Surgeon: Freada Bergeron, MD;  Location: [facility] ENDOSCOPY;  Service: Cardiovascular;  Laterality: N/A;   COLONOSCOPY     FLEXIBLE SIGMOIDOSCOPY  X 2   HAND SURGERY Left    "thumb; cleaned out arthritis"   HEEL SPUR EXCISION  Left    KNEE ARTHROSCOPY Right    LUMBAR LAMINECTOMY/DECOMPRESSION MICRODISCECTOMY N/A 03/30/2021   Procedure: LEFT L3 HEMILAMINECTOMY WITH EXCISION OF DISC HERNIATION;  Surgeon: Jessy Oto, MD;  Location: [place];  Service: Orthopedics;  Laterality: N/A;   SHOULDER SURGERY Right X 2   "cleaned out spurs"   TEE WITHOUT CARDIOVERSION N/A 02/14/2023   Procedure: TRANSESOPHAGEAL ECHOCARDIOGRAM (TEE);  Surgeon: Freada Bergeron, MD;  Location: [facility] ENDOSCOPY;  Service: Cardiovascular;  Laterality: N/A;   TOE FUSION Right    great toe; plates and screws   TONSILLECTOMY  ~ [place] Right 09/22/2015   Procedure: TOTAL HIP ARTHROPLASTY;  Surgeon: Garald Balding, MD;  Location: [place];  Service: Orthopedics;  Laterality: Right;   TOTAL KNEE ARTHROPLASTY Right 05/06/2014   TOTAL KNEE ARTHROPLASTY Right 05/06/2014   Procedure: RIGHT TOTAL KNEE ARTHROPLASTY;  Surgeon: Garald Balding, MD;  Location: [place];  Service: Orthopedics;  Laterality: Right;   TOTAL KNEE ARTHROPLASTY Left 02/05/2019   Procedure: LEFT TOTAL KNEE ARTHROPLASTY;  Surgeon: Garald Balding, MD;  Location: [place];  Service: Orthopedics;  Laterality: Left;    Allergies  Allergies  Allergen Reactions   Atorvastatin Calcium [Atorvastatin] Other (See Comments)    Myalgia   Simvastatin Other (  See Comments)    Myalgia   Pravastatin Other (See Comments)    Leg Cramps    History of Present Illness    Christian [name]  is a 77 year old male with the above mention past medical history who presents today for post DCCV follow-up. Christian [name] was recently seen in 2019 by [name] for complaint of hypotension and dizziness.  Blood pressure medications were adjusted and patient was recommended to wear compression stockings.  Stress Myoview was also completed and revealed normal test with no ischemia.  2D echo was also completed showing EF of 60 to 65% with no RWMA, grade 1 DD with calcified AV are and mild AI and  mild BAE.  He was seen in 05/2020 for follow-up and reported doing well with occasional stabbing pains in the chest lasting very few seconds.  Repeat event monitor shows sinus bradycardia with sinus tach and heart rate of 64 bpm and nocturnal bradycardia at 46 bpm.  He was most recently seen by [name] on 01/26/2023 for follow-up of blood pressure.  During visit patient reported episodes of heart racing and presyncope with some shortness of breath.  EKG was completed showing AF with RVR and rate of 137 bpm.  He was started on Xarelto ARB was decreased with metoprolol 50 mg twice daily started.  Plan was initiated to DCCV in 4 weeks if rate improves with metoprolol.  If patient's rate did not improve with metoprolol we would pursue TEE with possible DCCV.  He was seen in follow-up 02/10/2023 with rate stable elevated and atrial fibrillation.  He was consented for DCCV and agreed to proceed.  He underwent DCCV on 02/14/2023 with conversion to sinus rhythm.  He tolerated the procedure well and was discharged later that day.  Christian [name] presents today with his wife for post cardioversion follow-up.  Since last being seen in the office patient reports has been doing well but unfortunately he is back in atrial fibrillation today.  His heart rate is controlled at 81 bpm and he is currently not reporting any symptoms.  He has been compliant with his current medications and denies any adverse reactions.  His blood pressure today is well-controlled at 118/88.  He is currently having some sensations of thumping in his chest when he starts to exercise however this resolved spontaneously.  He does note that this morning his heart rate was in the 50s and may possibly be having paroxysmal atrial fibrillation.  We discussed the possible alternatives and treatment plans moving forward which may involve a referral to the AF clinic to discuss rhythm control and for further management.  Patient denies chest pain, palpitations, dyspnea,  PND, orthopnea, nausea, vomiting, dizziness, syncope, edema, weight gain, or early satiety.   Home Medications    Current Outpatient Medications  Medication Sig Dispense Refill   cholecalciferol (VITAMIN D3) 25 MCG (1000 UNIT) tablet Take 1,000 Units by mouth daily.     docusate sodium (COLACE) 100 MG capsule Take 100 mg by mouth 3 (three) times daily.     furosemide (LASIX) 40 MG tablet Take one tablet (40 mg) by mouth every day for three days only then take one tablet every other day thereafter. 90 tablet 3   irbesartan (AVAPRO) 150 MG tablet Take 1 tablet (150 mg total) by mouth daily. 90 tablet 3   isosorbide mononitrate (IMDUR) 60 MG 24 hr tablet Take 1 tablet (60 mg total) by mouth daily. Can take an additional half tab as  needed for chest pain 45 tablet 1   metoprolol tartrate (LOPRESSOR) 50 MG tablet Take 2 tabs in the morning and 1 tab in the evening 90 tablet 0   potassium chloride (KLOR-CON) 10 MEQ tablet Take one tablet (10 meq) by mouth every day for three days only then take one tablet every other day thereafter. 90 tablet 3   rivaroxaban (XARELTO) 20 MG TABS tablet Take 1 tablet (20 mg total) by mouth daily with supper. 90 tablet 3   rosuvastatin (CRESTOR) 20 MG tablet TAKE 1 TABLET BY MOUTH EVERY DAY 90 tablet 3   RSV vaccine recomb adjuvanted (AREXVY) 120 MCG/0.5ML injection Inject into the muscle. 1 each 0   No current facility-administered medications for this visit.     Review of Systems  Please see the history of present illness.    (+) Palpitations All other systems reviewed and are otherwise negative except as noted above.  Physical Exam    Wt Readings from Last 3 Encounters:  02/16/23 223 lb (101.2 kg)  02/10/23 224 lb 6.4 oz (101.8 kg)  01/31/23 230 lb (104.3 kg)   [id number] were no vitals filed for this visit.,There is no height or weight on file to calculate BMI.  Constitutional:      Appearance: Healthy appearance. Not in distress.  Neck:     Vascular:  JVD normal.  Pulmonary:     Effort: Pulmonary effort is normal.     Breath sounds: No wheezing. No rales. Diminished in the bases Cardiovascular:  Irregularly irregular normal S1. Normal S2.      Murmurs: There is no murmur.  Edema:    Peripheral edema absent.  Abdominal:     Palpations: Abdomen is soft non tender. There is no hepatomegaly.  Skin:    General: Skin is warm and dry.  Neurological:     General: No focal deficit present.     Mental Status: Alert and oriented to person, place and time.     Cranial Nerves: Cranial nerves are intact.  EKG/LABS/Other Studies Reviewed    ECG personally reviewed by me today -atrial fibrillation with rate of 81 bpm and no acute changes consistent with previous EKG.  Risk Assessment/Calculations:    CHA2DS2-VASc Score = 4   This indicates a 4.8% annual risk of stroke. The patient's score is based upon: CHF History: 1 HTN History: 1 Diabetes History: 0 Stroke History: 0 Vascular Disease History: 0 Age Score: 2 Gender Score: 0     Lab Results  Component Value Date   WBC 6.4 01/26/2023   HGB 14.3 01/26/2023   HCT 42.4 01/26/2023   MCV 100 (H) 01/26/2023   PLT 206 01/26/2023   Lab Results  Component Value Date   CREATININE 1.06 02/10/2023   BUN 18 02/10/2023   NA 139 02/10/2023   K 4.7 02/10/2023   CL 102 02/10/2023   CO2 23 02/10/2023   Lab Results  Component Value Date   ALT 15 01/26/2023   AST 23 01/26/2023   ALKPHOS 73 01/26/2023   BILITOT 0.5 01/26/2023   Lab Results  Component Value Date   CHOL 122 07/18/2022   HDL 62 07/18/2022   LDLCALC 48 07/18/2022   TRIG 53 07/18/2022   CHOLHDL 2.4 06/15/2020    Lab Results  Component Value Date   HGBA1C 5.1 07/18/2022    Assessment & Plan    1.  Paroxysmal atrial fibrillation: -Patient presents today for post DCCV follow-up unfortunately he was found to be in  atrial fibrillation with a controlled rate. -He is currently asymptomatic and we will add Cardizem 30  mg twice daily today and change metoprolol to Toprol-XL 100 mg daily -He will wear a ZIO monitor for 14 days to monitor AF burden -Continue Xarelto 20 mg daily -We may refer to the AF clinic for further guidance on rate and rhythm control if atrial fibrillation burden is elevated. -CHA2DS2-VASc Score = 4 [CHF History: 1, HTN History: 1, Diabetes History: 0, Stroke History: 0, Vascular Disease History: 0, Age Score: 2, Gender Score: 0].  Therefore, the patient's annual risk of stroke is 4.8 %.       2.  Coronary artery disease: -LHC performed 2017 with mild to moderate CAD -Today patient reports no recurrence of chest pain -He was advised to take an additional 30 mg of Imdur for chest discomfort -Continue current GDMT with metoprolol 100 mg in the a.m. and 50 mg in p.m., Crestor 20 mg  3.  Essential hypertension: -Patient's blood pressure today is 118/88 -Continue Avapro 150 mg daily and Lopressor as noted above   4.  Hyperlipidemia: -Patient's last LDL was 48 on 06/2022 -Continue Crestor 20 mg daily  5.  Chronic diastolic CHF: -Patient is euvolemic on exam today and reports no shortness of breath currently. -Continue Lasix 40 mg as prescribed -Continue low-sodium heart healthy diet   Disposition: Follow-up with Fransico Him, MD or APP in 1 months   Medication Adjustments/Labs and Tests Ordered: Current medicines are reviewed at length with the patient today.  Concerns regarding medicines are outlined above.   Signed, Mable Fill, Marissa Nestle, NP 02/22/2023, 2:00 PM [facility]  Note:  This document was prepared using Dragon voice recognition software and may include unintentional dictation errors.

## 2023-02-23 ENCOUNTER — Ambulatory Visit: Payer: Medicare Other | Admitting: Cardiology

## 2023-02-24 ENCOUNTER — Encounter: Payer: Self-pay | Admitting: Nurse Practitioner

## 2023-02-24 ENCOUNTER — Ambulatory Visit (HOSPITAL_COMMUNITY): Admit: 2023-02-24 | Payer: Medicare Other | Admitting: Internal Medicine

## 2023-02-24 ENCOUNTER — Ambulatory Visit: Payer: Medicare Other | Attending: Nurse Practitioner | Admitting: Nurse Practitioner

## 2023-02-24 ENCOUNTER — Encounter (HOSPITAL_COMMUNITY): Payer: Self-pay

## 2023-02-24 ENCOUNTER — Ambulatory Visit: Payer: Medicare Other | Attending: Nurse Practitioner

## 2023-02-24 VITALS — BP 118/88 | HR 70 | Ht 71.0 in | Wt 223.4 lb

## 2023-02-24 DIAGNOSIS — I4819 Other persistent atrial fibrillation: Secondary | ICD-10-CM | POA: Diagnosis not present

## 2023-02-24 DIAGNOSIS — I2510 Atherosclerotic heart disease of native coronary artery without angina pectoris: Secondary | ICD-10-CM | POA: Diagnosis not present

## 2023-02-24 DIAGNOSIS — I5032 Chronic diastolic (congestive) heart failure: Secondary | ICD-10-CM | POA: Diagnosis not present

## 2023-02-24 DIAGNOSIS — E7800 Pure hypercholesterolemia, unspecified: Secondary | ICD-10-CM

## 2023-02-24 DIAGNOSIS — I10 Essential (primary) hypertension: Secondary | ICD-10-CM

## 2023-02-24 SURGERY — CARDIOVERSION
Anesthesia: General

## 2023-02-24 MED ORDER — DILTIAZEM HCL 30 MG PO TABS: 30.0000 mg | ORAL_TABLET | Freq: Two times a day (BID) | ORAL | 3 refills | Status: DC

## 2023-02-24 MED ORDER — METOPROLOL SUCCINATE ER 100 MG PO TB24: 100.0000 mg | ORAL_TABLET | Freq: Every day | ORAL | 3 refills | Status: DC

## 2023-02-24 NOTE — Patient Instructions (Addendum)
Medication Instructions:  STOP Metoprolol Tartrate START Toprol XL '100mg'$  Take 1 tablet once a day  START Cardizem '30mg'$  Take 1 tablet twice a day  *If you need a refill on your cardiac medications before your next appointment, please call your pharmacy*   Lab Work: None ordered If you have labs (blood work) drawn today and your tests are completely normal, you will receive your results only by: [place] (if you have MyChart) OR A paper copy in the mail If you have any lab test that is abnormal or we need to change your treatment, we will call you to review the results.   Testing/Procedures: [place]- Long Term Monitor Instructions  Your physician has requested you wear a ZIO patch monitor for 14 days.  This is a single patch monitor. [employer] supplies one patch monitor per enrollment. Additional stickers are not available. Please do not apply patch if you will be having a Nuclear Stress Test,  Echocardiogram, Cardiac CT, MRI, or Chest Xray during the period you would be wearing the  monitor. The patch cannot be worn during these tests. You cannot remove and re-apply the  ZIO XT patch monitor.  Your ZIO patch monitor will be mailed 3 day USPS to your address on file. It may take 3-5 days  to receive your monitor after you have been enrolled.  Once you have received your monitor, please review the enclosed instructions. Your monitor  has already been registered assigning a specific monitor serial # to you.  Billing and Patient Assistance Program Information  We have supplied [employer] with any of your insurance information on file for billing purposes. [employer] offers a sliding scale Patient Assistance Program for patients that do not have  insurance, or whose insurance does not completely cover the cost of the ZIO monitor.  You must apply for the Patient Assistance Program to qualify for this discounted rate.  To apply, please call [employer] at [phone], select option 4, select  option 2, ask to apply for  Patient Assistance Program. Theodore Demark will ask your household income, and how many people  are in your household. They will quote your out-of-pocket cost based on that information.  [employer] will also be able to set up a 25-month interest-free payment plan if needed.  Applying the monitor   Shave hair from upper left chest.  Hold abrader disc by orange tab. Rub abrader in 40 strokes over the upper left chest as  indicated in your monitor instructions.  Clean area with 4 enclosed alcohol pads. Let dry.  Apply patch as indicated in monitor instructions. Patch will be placed under collarbone on left  side of chest with arrow pointing upward.  Rub patch adhesive wings for 2 minutes. Remove white label marked "1". Remove the white  label marked "2". Rub patch adhesive wings for 2 additional minutes.  While looking in a mirror, press and release button in center of patch. A small green light will  flash 3-4 times. This will be your only indicator that the monitor has been turned on.  Do not shower for the first 24 hours. You may shower after the first 24 hours.  Press the button if you feel a symptom. You will hear a small click. Record Date, Time and  Symptom in the Patient Logbook.  When you are ready to remove the patch, follow instructions on the last 2 pages of Patient  Logbook. Stick patch monitor onto the last page of Patient Logbook.  Place Patient Logbook  in the blue and white box. Use locking tab on box and tape box closed  securely. The blue and white box has prepaid postage on it. Please place it in the mailbox as  soon as possible. Your physician should have your test results approximately 7 days after the  monitor has been mailed back to [facility].  Call [place] at [phone] if you have questions regarding  your ZIO XT patch monitor. Call them immediately if you see an orange light blinking on your  monitor.  If your monitor  falls off in less than 4 days, contact our Monitor department at [phone].  If your monitor becomes loose or falls off after 4 days call [employer] at [phone] for  suggestions on securing your monitor    Follow-Up: At [facility], you and your health needs are our priority.  As part of our continuing mission to provide you with exceptional heart care, we have created designated Provider Care Teams.  These Care Teams include your primary Cardiologist (physician) and Advanced Practice Providers (APPs -  Physician Assistants and Nurse Practitioners) who all work together to provide you with the care you need, when you need it.  We recommend signing up for the patient portal called "MyChart".  Sign up information is provided on this After Visit Summary.  MyChart is used to connect with patients for Virtual Visits (Telemedicine).  Patients are able to view lab/test results, encounter notes, upcoming appointments, etc.  Non-urgent messages can be sent to your provider as well.   To learn more about what you can do with MyChart, go to [email or web address].    Your next appointment:   1 month(s)  Provider:   [name], NP        Other Instructions

## 2023-02-24 NOTE — Progress Notes (Unsigned)
Enrolled patient for a 14 day Zio XT monitor to be mailed to patients home  Dr Radford Pax to read

## 2023-02-28 DIAGNOSIS — I4819 Other persistent atrial fibrillation: Secondary | ICD-10-CM

## 2023-02-28 DIAGNOSIS — I10 Essential (primary) hypertension: Secondary | ICD-10-CM

## 2023-02-28 DIAGNOSIS — E7800 Pure hypercholesterolemia, unspecified: Secondary | ICD-10-CM

## 2023-02-28 DIAGNOSIS — I2510 Atherosclerotic heart disease of native coronary artery without angina pectoris: Secondary | ICD-10-CM

## 2023-02-28 NOTE — Progress Notes (Unsigned)
Chief Complaint:   OBESITY Christian [name] is here to discuss his progress with his obesity treatment plan along with follow-up of his obesity related diagnoses. Christian [name] is on the Category 1 plan and states he is following his eating plan approximately 95% of the time. Christian [name] states he is walking for 15 minutes 3 times per week.  Today's visit was #: 37 Starting weight: 278 lbs Starting date: 06/24/20 Today's weight: 223 lbs Today's date: 02/16/23 Total lbs lost to date: 55 Total lbs lost since last in-office visit: -7  Interim History: He is down 7 pounds since his last visit and doing better than at last visit.  He had a cardiac procedure this Tuesday and states rhythm converted.  Subjective:   1. Other fatigue Taking Lasix.  Cardioversion this week.  Taking Xarelto/improving.  2. Chronic diastolic heart failure ([place]) TEE/cardioversion 02/14/2023.  LVEF: 60-65%  Assessment/Plan:   1. Other fatigue Follow-up 02/24/2023.  2. Chronic diastolic heart failure (Christian [name]) 1.  Follow-up with cardiologist. 2.  Continue all medications.  3. Generalized obesity BMI 31.0-31.9,adult 1.  Meal planning. 2.  Balancing water needs: Urine light yellow. 3.  Weigh self daily. 4.  Fiber handout. 5.  Increase protein.  Christian [name] is currently in the action stage of change. As such, his goal is to continue with weight loss efforts. He has agreed to the Category 1 plan.  Exercise goals: Continue walking for 15 minutes 3 times per week and increase over time.  Behavioral modification strategies: increasing lean protein intake, decreasing simple carbohydrates, increasing vegetables, increasing water intake, decreasing sodium intake, increasing high fiber foods, decreasing eating out, no skipping meals, and planning for success.  Donovan has agreed to follow-up with our clinic in 4 weeks. He was informed of the importance of frequent follow-up visits to maximize his success with intensive lifestyle  modifications for his multiple health conditions.    Objective:   Blood pressure 135/87, pulse 64, temperature 97.6 F (36.4 C), height '5\' 11"'$  (1.803 m), weight 223 lb (101.2 kg), SpO2 97 %. Body mass index is 31.1 kg/m.  General: Cooperative, alert, well developed, in no acute distress. HEENT: Conjunctivae and lids unremarkable. Cardiovascular: Regular rhythm.  Lungs: Normal work of breathing. Neurologic: No focal deficits.   Lab Results  Component Value Date   CREATININE 1.06 02/10/2023   BUN 18 02/10/2023   NA 139 02/10/2023   K 4.7 02/10/2023   CL 102 02/10/2023   CO2 23 02/10/2023   Lab Results  Component Value Date   ALT 15 01/26/2023   AST 23 01/26/2023   ALKPHOS 73 01/26/2023   BILITOT 0.5 01/26/2023   Lab Results  Component Value Date   HGBA1C 5.1 07/18/2022   HGBA1C 5.3 09/28/2021   HGBA1C 5.4 06/24/2020   Lab Results  Component Value Date   INSULIN 5.4 07/18/2022   INSULIN 5.1 09/28/2021   INSULIN 4.4 01/26/2021   INSULIN 6.8 10/05/2020   INSULIN 12.1 06/24/2020   Lab Results  Component Value Date   TSH 4.490 01/26/2023   Lab Results  Component Value Date   CHOL 122 07/18/2022   HDL 62 07/18/2022   LDLCALC 48 07/18/2022   TRIG 53 07/18/2022   CHOLHDL 2.4 06/15/2020   Lab Results  Component Value Date   VD25OH 59.4 07/18/2022   VD25OH 58.9 09/28/2021   VD25OH 36.6 01/26/2021   Lab Results  Component Value Date   WBC 6.4 01/26/2023   HGB 14.3 01/26/2023   HCT 42.4  01/26/2023   MCV 100 (H) 01/26/2023   PLT 206 01/26/2023   No results found for: "IRON", "TIBC", "FERRITIN"  Attestation Statements:   Reviewed by clinician on day of visit: allergies, medications, problem list, medical history, surgical history, family history, social history, and previous encounter notes.  I, Dawn Whitmire, FNP-C, am acting as transcriptionist for Dr. Jearld Lesch.  I have reviewed the above documentation for accuracy and completeness, and I agree  with the above. Jearld Lesch, DO

## 2023-03-02 ENCOUNTER — Encounter: Payer: Self-pay | Admitting: Bariatrics

## 2023-03-16 DIAGNOSIS — C4442 Squamous cell carcinoma of skin of scalp and neck: Secondary | ICD-10-CM | POA: Diagnosis not present

## 2023-03-20 ENCOUNTER — Ambulatory Visit (INDEPENDENT_AMBULATORY_CARE_PROVIDER_SITE_OTHER): Payer: Medicare Other | Admitting: Bariatrics

## 2023-03-20 ENCOUNTER — Encounter: Payer: Self-pay | Admitting: Bariatrics

## 2023-03-20 VITALS — BP 133/90 | HR 67 | Temp 97.6°F | Ht 71.0 in | Wt 227.0 lb

## 2023-03-20 DIAGNOSIS — Z6831 Body mass index (BMI) 31.0-31.9, adult: Secondary | ICD-10-CM | POA: Diagnosis not present

## 2023-03-20 DIAGNOSIS — E65 Localized adiposity: Secondary | ICD-10-CM

## 2023-03-20 DIAGNOSIS — E668 Other obesity: Secondary | ICD-10-CM | POA: Diagnosis not present

## 2023-03-20 DIAGNOSIS — I10 Essential (primary) hypertension: Secondary | ICD-10-CM | POA: Diagnosis not present

## 2023-03-20 DIAGNOSIS — I5032 Chronic diastolic (congestive) heart failure: Secondary | ICD-10-CM

## 2023-03-20 DIAGNOSIS — E669 Obesity, unspecified: Secondary | ICD-10-CM

## 2023-03-20 DIAGNOSIS — I4819 Other persistent atrial fibrillation: Secondary | ICD-10-CM | POA: Diagnosis not present

## 2023-03-21 NOTE — Progress Notes (Unsigned)
Chief Complaint:   OBESITY Govinda is here to discuss his progress with his obesity treatment plan along with follow-up of his obesity related diagnoses. Chuong is on the Category 1 Plan and states he is following his eating plan approximately 90% of the time. Caspen states he is walking for 30 minutes 6 times per week.  Today's visit was #: 2 Starting weight: 278 lbs Starting date: 06/24/2020 Today's weight: 227 lbs Today's date: 03/20/2023 Total lbs lost to date: 51 Total lbs lost since last in-office visit: 0  Interim History: Jeanluc is up 4 pounds since his last visit.  According to the bioimpedance scale his muscle is up almost 2 pounds, and his water weight is down almost 1 pound and fat mass is up 2-1/2 pounds.  Subjective:   1. Chronic diastolic heart failure (HCC) Curby has no fluid gain per our bioimpedance scale.  He has been wearing a monitor (back in A-fib).  2. Visceral obesity Sai's visceral fat rating is 21.  Assessment/Plan:   1. Chronic diastolic heart failure (HCC) Derrik will continue Lasix per his PCP and cardiologist.  He will continue all his other medications.  May plan to go to A-fib clinic.  He will check his blood pressures at home.  2. Visceral obesity Ideal goal visceral fat rating is at or below 13.  Alikai will work on healthy fats.  Healthy versus unhealthy fats were discussed.  3. Generalized obesity  4. BMI 31.0-31.9,adult Brigido is currently in the action stage of change. As such, his goal is to continue with weight loss efforts. He has agreed to the Category 1 Plan.   Meal planning and intentional eating were discussed.  He will get back to his eating plan.  Exercise goals: As is.   Behavioral modification strategies: increasing lean protein intake, decreasing simple carbohydrates, increasing vegetables, increasing water intake, decreasing eating out, no skipping meals, meal planning and cooking strategies, keeping healthy  foods in the home, and planning for success.  Axzel has agreed to follow-up with our clinic in 4 weeks. He was informed of the importance of frequent follow-up visits to maximize his success with intensive lifestyle modifications for his multiple health conditions.   Objective:   Blood pressure (!) 133/90, pulse 67, temperature 97.6 F (36.4 C), height 5\' 11"  (1.803 m), weight 227 lb (103 kg), SpO2 98 %. Body mass index is 31.66 kg/m.  General: Cooperative, alert, well developed, in no acute distress. HEENT: Conjunctivae and lids unremarkable. Cardiovascular: Regular rhythm.  Lungs: Normal work of breathing. Neurologic: No focal deficits.   Lab Results  Component Value Date   CREATININE 1.06 02/10/2023   BUN 18 02/10/2023   NA 139 02/10/2023   K 4.7 02/10/2023   CL 102 02/10/2023   CO2 23 02/10/2023   Lab Results  Component Value Date   ALT 15 01/26/2023   AST 23 01/26/2023   ALKPHOS 73 01/26/2023   BILITOT 0.5 01/26/2023   Lab Results  Component Value Date   HGBA1C 5.1 07/18/2022   HGBA1C 5.3 09/28/2021   HGBA1C 5.4 06/24/2020   Lab Results  Component Value Date   INSULIN 5.4 07/18/2022   INSULIN 5.1 09/28/2021   INSULIN 4.4 01/26/2021   INSULIN 6.8 10/05/2020   INSULIN 12.1 06/24/2020   Lab Results  Component Value Date   TSH 4.490 01/26/2023   Lab Results  Component Value Date   CHOL 122 07/18/2022   HDL 62 07/18/2022   LDLCALC 48 07/18/2022  TRIG 53 07/18/2022   CHOLHDL 2.4 06/15/2020   Lab Results  Component Value Date   VD25OH 59.4 07/18/2022   VD25OH 58.9 09/28/2021   VD25OH 36.6 01/26/2021   Lab Results  Component Value Date   WBC 6.4 01/26/2023   HGB 14.3 01/26/2023   HCT 42.4 01/26/2023   MCV 100 (H) 01/26/2023   PLT 206 01/26/2023   No results found for: "IRON", "TIBC", "FERRITIN"  Attestation Statements:   Reviewed by clinician on day of visit: allergies, medications, problem list, medical history, surgical history, family  history, social history, and previous encounter notes.   Wilhemena Durie, am acting as Location manager for [employer], DO.  I have reviewed the above documentation for accuracy and completeness, and I agree with the above. Jearld Lesch, DO

## 2023-03-22 ENCOUNTER — Telehealth: Payer: Self-pay

## 2023-03-22 DIAGNOSIS — I4819 Other persistent atrial fibrillation: Secondary | ICD-10-CM

## 2023-03-22 MED ORDER — DILTIAZEM HCL ER COATED BEADS 120 MG PO CP24: 120.0000 mg | ORAL_CAPSULE | Freq: Every day | ORAL | 3 refills | Status: DC

## 2023-03-22 NOTE — Telephone Encounter (Signed)
Zio heart monitor results received from [name];  Results show 100% burden of Atrial Fib; See below:  ----- Message from Marylu Lund., NP sent at 03/22/2023  9:32 AM EDT ----- Please let Christian [name] know that his event monitor results show 100% burden of atrial fibrillation.  Please refer patient to the atrial fibrillation clinic for further treatment recommendations and management.  Please discontinue Cardizem 30 mg twice daily and start Cardizem ER 120 mg daily.   [name], NP   Pt called and made aware of [name]'s plan of care for the Pt.  Pt made aware of 100% AFIB burden, made aware he is to stop taking Cardizem 30 mg BID, and start taking Cardizem ER 120 mg daily;  Pt told the new script was sent into his pharmacy.   Pt wants [name] messaged, Pt needs to know if he should report / come to the 4/9 appointment at [facility]?  Pt advised I will message [name] and obtain an answer.    Pt advised I will contact AFIB Clinic per [name] request, to have a clinic appointment scheduled with an Christian [name].

## 2023-03-22 NOTE — Telephone Encounter (Signed)
-----   Message from Marylu Lund., NP sent at 03/22/2023  9:32 AM EDT ----- Please let [name] know that his event monitor results show 100% burden of atrial fibrillation.  Please refer patient to the atrial fibrillation clinic for further treatment recommendations and management.  Please discontinue Cardizem 30 mg twice daily and start Cardizem ER 120 mg daily.  [name], NP

## 2023-03-23 ENCOUNTER — Encounter: Payer: Self-pay | Admitting: Bariatrics

## 2023-03-29 NOTE — Telephone Encounter (Signed)
I think if we can get him into the A-fib clinic for consultation it would be fine to cancel his appointment on 04/04/2023 with me.   Christian Pancoast, NP   Message received from Mr. Christian Bitter NP;  Pt called and made aware that I was cancelling his 4/9 appointment, but advised to report to the [facility] appointment on 04/10/2023.  Pt verbalized understanding.

## 2023-04-04 ENCOUNTER — Ambulatory Visit: Payer: Medicare Other | Admitting: Nurse Practitioner

## 2023-04-06 DIAGNOSIS — Z85828 Personal history of other malignant neoplasm of skin: Secondary | ICD-10-CM | POA: Diagnosis not present

## 2023-04-06 DIAGNOSIS — Z5189 Encounter for other specified aftercare: Secondary | ICD-10-CM | POA: Diagnosis not present

## 2023-04-10 ENCOUNTER — Ambulatory Visit (HOSPITAL_COMMUNITY)
Admission: RE | Admit: 2023-04-10 | Discharge: 2023-04-10 | Disposition: A | Discharge status: Home / Self Care | Payer: Medicare Other | Source: Ambulatory Visit | Attending: Internal Medicine | Admitting: Internal Medicine

## 2023-04-10 VITALS — BP 144/110 | HR 90 | Ht 71.0 in | Wt 233.4 lb

## 2023-04-10 DIAGNOSIS — D6869 Other thrombophilia: Secondary | ICD-10-CM | POA: Diagnosis not present

## 2023-04-10 DIAGNOSIS — I5032 Chronic diastolic (congestive) heart failure: Secondary | ICD-10-CM | POA: Diagnosis not present

## 2023-04-10 DIAGNOSIS — Z8249 Family history of ischemic heart disease and other diseases of the circulatory system: Secondary | ICD-10-CM | POA: Diagnosis not present

## 2023-04-10 DIAGNOSIS — I110 Hypertensive heart disease with heart failure: Secondary | ICD-10-CM | POA: Diagnosis not present

## 2023-04-10 DIAGNOSIS — I4819 Other persistent atrial fibrillation: Secondary | ICD-10-CM

## 2023-04-10 DIAGNOSIS — R0683 Snoring: Secondary | ICD-10-CM | POA: Insufficient documentation

## 2023-04-10 DIAGNOSIS — I2510 Atherosclerotic heart disease of native coronary artery without angina pectoris: Secondary | ICD-10-CM | POA: Diagnosis not present

## 2023-04-10 DIAGNOSIS — I351 Nonrheumatic aortic (valve) insufficiency: Secondary | ICD-10-CM | POA: Insufficient documentation

## 2023-04-10 DIAGNOSIS — E669 Obesity, unspecified: Secondary | ICD-10-CM | POA: Insufficient documentation

## 2023-04-10 DIAGNOSIS — Z7901 Long term (current) use of anticoagulants: Secondary | ICD-10-CM | POA: Insufficient documentation

## 2023-04-10 DIAGNOSIS — Z6832 Body mass index (BMI) 32.0-32.9, adult: Secondary | ICD-10-CM | POA: Diagnosis not present

## 2023-04-10 DIAGNOSIS — Z79899 Other long term (current) drug therapy: Secondary | ICD-10-CM | POA: Diagnosis not present

## 2023-04-10 DIAGNOSIS — E785 Hyperlipidemia, unspecified: Secondary | ICD-10-CM | POA: Diagnosis not present

## 2023-04-10 MED ORDER — METOPROLOL SUCCINATE ER 100 MG PO TB24: ORAL_TABLET | ORAL | 3 refills | Status: DC

## 2023-04-10 NOTE — Patient Instructions (Signed)
Increase metoprolol to 1 Tablet (100mg ) in the morning and 1/2 tablet (50mg ) in the evening.

## 2023-04-10 NOTE — Progress Notes (Signed)
Primary Care Physician: [name] Primary Cardiologist: Dr. Mayford Knife Primary Electrophysiologist: None Referring Physician: Gaston Islam., NP   Christian [name] is a 77 y.o. male with a history of CAD (heart cath 2017 showing mild to moderate disease), HTN, HLD, PVCs, and persistent atrial fibrillation who presents for consultation in the [facility]. Seen by cardiology on 01/26/23 and found to be in Afib with RVR. He underwent DCCV on 02/14/23 with successful conversion to sinus bradycardia with 1 shock. He was seen for DCCV f/u on 02/24/23 and found to be back in Afib. Cardiac monitor placed at that time showed 100% Afib burden. Currently taking cardizem 120 mg once daily, Toprol 100 mg daily, and Xarelto 20 mg daily. The patient was initially diagnosed with atrial fibrillation on 01/26/23 after presenting to cardiology with symptoms of heart racing, dizziness, shortness of breath, and weakness. Patient is on Xarelto 20 mg daily for a CHADS2VASC score of 4.  On evaluation today, he is currently in Afib. He will definitely feel tired and short of breath after periods of exertion. He has been compliant with diltiazem 120 mg daily and Toprol 100 mg daily. He has not missed any doses of Xarelto 20 mg daily and has no bleeding concerns. He is interested in discussing options for management of his Afib. He has noted elevated BP at home with readings ranging 140-160/100. Some dizziness with standing though not all the time. He is drinking about 80 oz water daily.   Wife believes he has sleep apnea but patient does not believe he has symptoms of it.  Father had history of Afib.  Today, he denies symptoms of palpitations, chest pain, orthopnea, PND, lower extremity edema, presyncope, syncope, snoring, daytime somnolence, bleeding, or neurologic sequela. The patient is tolerating medications without difficulties and is otherwise without complaint today.   Atrial  Fibrillation Risk Factors:  he does have symptoms or diagnosis of sleep apnea. he does not have a history of rheumatic fever. he does not have a history of alcohol use. The patient does have a history of early familial atrial fibrillation or other arrhythmias.  he has a BMI of Body mass index is 32.55 kg/m.[name] Kitchen Filed Weights   04/10/23 1101  Weight: 105.9 kg    Family History  Problem Relation Age of Onset   CVA Mother    Stroke Mother    CVA Father    Heart disease Father    High blood pressure Father    High Cholesterol Father    Stroke Father      Atrial Fibrillation Management history:  Previous antiarrhythmic drugs: None Previous cardioversions: 02/14/23 Previous ablations: None Anticoagulation history: Xarelto 20 mg daily   Past Medical History:  Diagnosis Date   Arthritis    "joints; shoulders; left elbow; right thumb" (05/06/2014)   Atrial tachycardia    Breathing problem    Cataracts, bilateral    immature   Chest pain    Constipation    takes Colace and Metamucil daily   Coronary artery disease 02/2011   cath 11/2016 showing 20% RCA, 50% OM, 35% mid LCx, 70% D1, 65% mid LAD, 70% distal LAD 80% on medical management   Diastolic dysfunction    GERD (gastroesophageal reflux disease)    Heart murmur    Hemorrhoids    History of bronchitis    Hypercholesterolemia    takes Crestor daily   Hypertension    Joint pain    Joint  pain    Nocturia    Orthostatic hypotension    Palpitations    Premature atrial contractions    PVC's (premature ventricular contractions)    Restless leg syndrome    Rheumatoid arthritis (HCC)    Sciatica    Sinus bradycardia    Past Surgical History:  Procedure Laterality Date   ACHILLES TENDON REPAIR Left    "& fixed a spur"   CARDIAC CATHETERIZATION  2012   CARDIAC CATHETERIZATION N/A 12/06/2016   Procedure: Left Heart Cath and Coronary Angiography;  Surgeon: Lyn Records, MD;  Location: [facility] INVASIVE CV LAB;  Service:  Cardiovascular;  Laterality: N/A;   CARDIOVERSION N/A 02/14/2023   Procedure: CARDIOVERSION;  Surgeon: Meriam Sprague, MD;  Location: [facility] ENDOSCOPY;  Service: Cardiovascular;  Laterality: N/A;   COLONOSCOPY     FLEXIBLE SIGMOIDOSCOPY  X 2   HAND SURGERY Left    "thumb; cleaned out arthritis"   HEEL SPUR EXCISION Left    KNEE ARTHROSCOPY Right    LUMBAR LAMINECTOMY/DECOMPRESSION MICRODISCECTOMY N/A 03/30/2021   Procedure: LEFT L3 HEMILAMINECTOMY WITH EXCISION OF DISC HERNIATION;  Surgeon: Kerrin Champagne, MD;  Location: [facility] OR;  Service: Orthopedics;  Laterality: N/A;   SHOULDER SURGERY Right X 2   "cleaned out spurs"   TEE WITHOUT CARDIOVERSION N/A 02/14/2023   Procedure: TRANSESOPHAGEAL ECHOCARDIOGRAM (TEE);  Surgeon: Meriam Sprague, MD;  Location: [facility] ENDOSCOPY;  Service: Cardiovascular;  Laterality: N/A;   TOE FUSION Right    great toe; plates and screws   TONSILLECTOMY  ~ 1950   TOTAL HIP ARTHROPLASTY Right 09/22/2015   Procedure: TOTAL HIP ARTHROPLASTY;  Surgeon: Valeria Batman, MD;  Location: [facility] OR;  Service: Orthopedics;  Laterality: Right;   TOTAL KNEE ARTHROPLASTY Right 05/06/2014   TOTAL KNEE ARTHROPLASTY Right 05/06/2014   Procedure: RIGHT TOTAL KNEE ARTHROPLASTY;  Surgeon: Valeria Batman, MD;  Location: [facility] OR;  Service: Orthopedics;  Laterality: Right;   TOTAL KNEE ARTHROPLASTY Left 02/05/2019   Procedure: LEFT TOTAL KNEE ARTHROPLASTY;  Surgeon: Valeria Batman, MD;  Location: [facility] OR;  Service: Orthopedics;  Laterality: Left;    Current Outpatient Medications  Medication Sig Dispense Refill   cholecalciferol (VITAMIN D3) 25 MCG (1000 UNIT) tablet Take 1,000 Units by mouth daily.     diltiazem (CARDIZEM CD) 120 MG 24 hr capsule Take 1 capsule (120 mg total) by mouth daily. 90 capsule 3   docusate sodium (COLACE) 100 MG capsule Take 100 mg by mouth 3 (three) times daily.     furosemide (LASIX) 40 MG tablet Take one tablet (40 mg) by mouth every day for three days  only then take one tablet every other day thereafter. 90 tablet 3   irbesartan (AVAPRO) 150 MG tablet Take 1 tablet (150 mg total) by mouth daily. 90 tablet 3   isosorbide mononitrate (IMDUR) 60 MG 24 hr tablet Take 1 tablet (60 mg total) by mouth daily. Can take an additional half tab as needed for chest pain 45 tablet 1   potassium chloride (KLOR-CON) 10 MEQ tablet Take one tablet (10 meq) by mouth every day for three days only then take one tablet every other day thereafter. 90 tablet 3   rivaroxaban (XARELTO) 20 MG TABS tablet Take 1 tablet (20 mg total) by mouth daily with supper. 90 tablet 3   rosuvastatin (CRESTOR) 20 MG tablet TAKE 1 TABLET BY MOUTH EVERY DAY 90 tablet 3   metoprolol succinate (TOPROL-XL) 100 MG 24 hr tablet Take 1  tablet by mouth in the AM ( ) and 1/2 tablet in the PM ( ) 45 tablet 3   No current facility-administered medications for this encounter.    Allergies  Allergen Reactions   Atorvastatin Calcium [Atorvastatin] Other (See Comments)    Myalgia   Simvastatin Other (See Comments)    Myalgia   Pravastatin Other (See Comments)    Leg Cramps    Social History   Socioeconomic History   Marital status: Married    Spouse name: Francena Hanly   Number of children: Not on file   Years of education: Not on file   Highest education level: Not on file  Occupational History   Occupation: Retired  Tobacco Use   Smoking status: Former    Packs/day: 2.00    Years: 10.00    Additional pack years: 0.00    Total pack years: 20.00    Types: Cigarettes    Quit date: 1981    Years since quitting: 43.3   Smokeless tobacco: Never   Tobacco comments:    quit smoking in 1981  Vaping Use   Vaping Use: Never used  Substance and Sexual Activity   Alcohol use: Yes    Comment: "drink a beer sometimes once/month"   Drug use: No   Sexual activity: Never  Other Topics Concern   Not on file  Social History Narrative   Not on file   Social Determinants of Health    Financial Resource Strain: Not on file  Food Insecurity: Not on file  Transportation Needs: Not on file  Physical Activity: Not on file  Stress: Not on file  Social Connections: Not on file  Intimate Partner Violence: Not on file     ROS- All systems are reviewed and negative except as per the HPI above.  Physical Exam: Vitals:   04/10/23 1101  BP: (!) 144/110  Pulse: 90  Weight: 105.9 kg  Height:  (1.803 m)    GEN- The patient is well appearing, alert and oriented x 3 today.   Head- normocephalic, atraumatic Eyes-  Sclera clear, conjunctiva pink Ears- hearing intact Oropharynx- clear Neck- supple, no JVP Lymph- no cervical lymphadenopathy Lungs- Clear to ausculation bilaterally, normal work of breathing Heart- Irregular rate and rhythm, no murmurs, rubs or gallops, PMI not laterally displaced GI- soft, NT, ND, + BS Extremities- no clubbing, cyanosis, or edema MS- no significant deformity or atrophy Skin- no rash or lesion Psych- euthymic mood, full affect Neuro- strength and sensation are intact   Wt Readings from Last 3 Encounters:  04/10/23 105.9 kg  03/20/23 103 kg  02/24/23 101.3 kg    EKG today demonstrates Afib HR 90 PR * ms QRS 86 ms QT/Qtc 360/440 ms  Echo 2/20//24 (TEE) demonstrated: 1. Left ventricular ejection fraction, by estimation, is 60 to 65%. The  left ventricle has normal function.   2. Right ventricular systolic function is normal. The right ventricular  size is normal. There is normal pulmonary artery systolic pressure.   3. Left atrial size was mildly dilated. No left atrial/left atrial  appendage thrombus was detected. The LAA emptying velocity was 52 cm/s.   4. Right atrial size was mildly dilated.   5. The mitral valve is grossly normal. Mild to moderate mitral valve  regurgitation.   6. The aortic valve is tricuspid. There is mild calcification of the  aortic valve. There is mild thickening of the aortic valve. Aortic  valve  regurgitation is mild. Aortic valve sclerosis/calcification is present,  without any evidence of aortic  stenosis.   7. Aortic dilatation noted. There is mild dilatation of the ascending  aorta, measuring 43 mm.   8. Following TEE, the patient underwent successful DCCV with 1 shock at  150J.   Cardiac monitor 3/5-19/24: 100% Afib burden PVC load 4.1%  Epic records are reviewed at length today.  CHA2DS2-VASc Score = 4  The patient's score is based upon: CHF History: 1 HTN History: 1 Diabetes History: 0 Stroke History: 0 Vascular Disease History: 0 Age Score: 2 Gender Score: 0       ASSESSMENT AND PLAN: Persistent Atrial Fibrillation (ICD10:  I48.19) The patient's CHA2DS2-VASc score is 4, indicating a 4.8% annual risk of stroke.    Education provided about Afib with visual diagram. Discussion about medication treatments and ablation in detail. After discussion, patient is interested in speaking with EP MD regarding Afib ablation. He is hesitant to take additional medication.  If in the future he considers Tikosyn, current CrCl is 85 mL/min and would qualify him for 500 mcg BID dose. Amiodarone could also be considered but he is hesitant considering potential adverse effect profile. Multaq is potentially an option but could be prohibitive due to cost.   2. Secondary Hypercoagulable State (ICD10:  D68.69) The patient is at significant risk for stroke/thromboembolism based upon his CHA2DS2-VASc Score of 4.  Continue Rivaroxaban (Xarelto).   Importance of anticoagulation and education provided to remain compliant.  3. Obesity Body mass index is 32.55 kg/m. Lifestyle modification was discussed at length including regular exercise and weight reduction. Encouraged daily walking  4. Snoring with concern for Obstructive sleep apnea Wife is adamant that he has diagnosis but patient currently refuses sleep study at this time. I will revisit with him in the future.  5.  HTN Elevated today and per patient home readings are also elevated. Will increase Toprol to total of 150 mg daily, divided 100 mg AM 50 mg PM. Continue to monitor BP readings at home.    Will arrange establish visit with EP to discuss ablation.   Lake Bells, PA-C Afib Clinic [facility] [address] [place], [place] [address] [phone] 04/10/2023 12:07 PM

## 2023-04-24 ENCOUNTER — Ambulatory Visit (INDEPENDENT_AMBULATORY_CARE_PROVIDER_SITE_OTHER): Payer: Medicare Other | Admitting: Bariatrics

## 2023-04-24 ENCOUNTER — Encounter: Payer: Self-pay | Admitting: Bariatrics

## 2023-04-24 VITALS — BP 114/74 | HR 70 | Temp 97.7°F | Ht 71.0 in | Wt 226.0 lb

## 2023-04-24 DIAGNOSIS — Z6831 Body mass index (BMI) 31.0-31.9, adult: Secondary | ICD-10-CM | POA: Diagnosis not present

## 2023-04-24 DIAGNOSIS — I5032 Chronic diastolic (congestive) heart failure: Secondary | ICD-10-CM

## 2023-04-24 DIAGNOSIS — E669 Obesity, unspecified: Secondary | ICD-10-CM

## 2023-04-25 NOTE — Progress Notes (Signed)
Chief Complaint:   OBESITY Christian [name] is here to discuss his progress with his obesity treatment plan along with follow-up of his obesity related diagnoses. Christian [name] is on the Category 1 Plan and states he is following his eating plan approximately 90% of the time. Christian [name] states he is walking for 30 minutes 4 times per week.  Today's visit was #: 35 Starting weight: 278 lbs Starting date: 06/24/2020 Today's weight: 226 lbs Today's date: 04/24/2023 Total lbs lost to date: 52 Total lbs lost since last in-office visit: 1  Interim History: Christian [name] is down 1 additional lb since his last visit. He states that he is in afib which has made him fatigued and not feeling well.   Subjective:   1. Chronic diastolic heart failure (HCC) Christian [name] is currently in afib. He is taking Xarelto, metoprolol, Imdur, Avapro, Lasix, and Cardizem.   Assessment/Plan:   1. Chronic diastolic heart failure (HCC) Christian [name] will continue his medications and exercise. He will follow-up with Cardiology and afib clinic on 05/05/2023. He is to discuss ablation with the Cardiologist. He will drink 48 ounce of water daily, increase fiber, bran, flaxseed, pudding, and yogurt as well.   2. Generalized obesity  3. BMI 31.0-31.9,adult Christian [name] is currently in the action stage of change. As such, his goal is to continue with weight loss efforts. He has agreed to the Category 1 Plan.   He will continue to walk and keep his water and protein intake high.   Exercise goals: As is.   Behavioral modification strategies: increasing lean protein intake, decreasing simple carbohydrates, increasing vegetables, increasing water intake, decreasing eating out, no skipping meals, meal planning and cooking strategies, keeping healthy foods in the home, and planning for success.  Christian [name] has agreed to follow-up with our clinic in 6 weeks. He was informed of the importance of frequent follow-up visits to maximize his success with intensive  lifestyle modifications for his multiple health conditions.   Objective:   Blood pressure 114/74, pulse 70, temperature 97.7 F (36.5 C), height 5\' 11"  (1.803 m), weight 226 lb (102.5 kg), SpO2 98 %. Body mass index is 31.52 kg/m.  General: Cooperative, alert, well developed, in no acute distress. HEENT: Conjunctivae and lids unremarkable. Cardiovascular: Regular rhythm.  Lungs: Normal work of breathing. Neurologic: No focal deficits.   Lab Results  Component Value Date   CREATININE 1.06 02/10/2023   BUN 18 02/10/2023   NA 139 02/10/2023   K 4.7 02/10/2023   CL 102 02/10/2023   CO2 23 02/10/2023   Lab Results  Component Value Date   ALT 15 01/26/2023   AST 23 01/26/2023   ALKPHOS 73 01/26/2023   BILITOT 0.5 01/26/2023   Lab Results  Component Value Date   HGBA1C 5.1 07/18/2022   HGBA1C 5.3 09/28/2021   HGBA1C 5.4 06/24/2020   Lab Results  Component Value Date   INSULIN 5.4 07/18/2022   INSULIN 5.1 09/28/2021   INSULIN 4.4 01/26/2021   INSULIN 6.8 10/05/2020   INSULIN 12.1 06/24/2020   Lab Results  Component Value Date   TSH 4.490 01/26/2023   Lab Results  Component Value Date   CHOL 122 07/18/2022   HDL 62 07/18/2022   LDLCALC 48 07/18/2022   TRIG 53 07/18/2022   CHOLHDL 2.4 06/15/2020   Lab Results  Component Value Date   VD25OH 59.4 07/18/2022   VD25OH 58.9 09/28/2021   VD25OH 36.6 01/26/2021   Lab Results  Component Value Date   WBC 6.4 01/26/2023  HGB 14.3 01/26/2023   HCT 42.4 01/26/2023   MCV 100 (H) 01/26/2023   PLT 206 01/26/2023   No results found for: "IRON", "TIBC", "FERRITIN"  Attestation Statements:   Reviewed by clinician on day of visit: allergies, medications, problem list, medical history, surgical history, family history, social history, and previous encounter notes.   Trude Mcburney, am acting as Energy manager for [employer], DO.  I have reviewed the above documentation for accuracy and completeness, and I agree  with the above. Corinna Capra, DO

## 2023-05-05 ENCOUNTER — Encounter: Payer: Self-pay | Admitting: Cardiology

## 2023-05-05 ENCOUNTER — Ambulatory Visit: Payer: Medicare Other | Attending: Cardiology | Admitting: Cardiology

## 2023-05-05 ENCOUNTER — Encounter: Payer: Self-pay | Admitting: *Deleted

## 2023-05-05 VITALS — BP 140/74 | HR 70 | Ht 71.0 in | Wt 236.0 lb

## 2023-05-05 DIAGNOSIS — D6869 Other thrombophilia: Secondary | ICD-10-CM | POA: Diagnosis not present

## 2023-05-05 DIAGNOSIS — I4819 Other persistent atrial fibrillation: Secondary | ICD-10-CM | POA: Insufficient documentation

## 2023-05-05 MED ORDER — AMIODARONE HCL 200 MG PO TABS: ORAL_TABLET | ORAL | 1 refills | Status: DC

## 2023-05-05 NOTE — Patient Instructions (Addendum)
Medication Instructions:  Your physician has recommended you make the following change in your medication:  START Amiodarone  - take 2 tablets (400 mg total) TWICE a day for 2 weeks, then  - take 1 tablet (200 mg total) TWICE a day for 2 weeks, then  - take 1 tablet (200 mg total) ONCE a day   *If you need a refill on your cardiac medications before your next appointment, please call your pharmacy*   Lab Work: Pre procedure labs -- see procedure instruction letter:  BMP & CBC  If you have labs (blood work) drawn today and your tests are completely normal, you will receive your results only by: MyChart Message (if you have MyChart) OR A paper copy in the mail If you have any lab test that is abnormal or we need to change your treatment, we will call you to review the results.   Testing/Procedures: Your physician has recommended that you have a Cardioversion (DCCV). Electrical Cardioversion uses a jolt of electricity to your heart either through paddles or wired patches attached to your chest. This is a controlled, usually prescheduled, procedure. Defibrillation is done under light anesthesia in the hospital, and you usually go home the day of the procedure. This is done to get your heart back into a normal rhythm. You are not awake for the procedure. Please see the instruction sheet given to you today.   Your physician has requested that you have cardiac CT within 7 days PRIOR to your ablation. Cardiac computed tomography (CT) is a painless test that uses an x-ray machine to take clear, detailed pictures of your heart.  Please follow instruction below located under "other instructions". You will get a call from our office to schedule the date for this test.  Your physician has recommended that you have an ablation. Catheter ablation is a medical procedure used to treat some cardiac arrhythmias (irregular heartbeats). During catheter ablation, a long, thin, flexible tube is put into a blood  vessel in your groin (upper thigh), or neck. This tube is called an ablation catheter. It is then guided to your heart through the blood vessel. Radio frequency waves destroy small areas of heart tissue where abnormal heartbeats may cause an arrhythmia to start. Please follow instruction letter given to you today.  YOU WILL BE SCHEDULED FOR 9/10.  THE EP SCHEDULER WILL CALL YOU AT A LATER DATE TO GO OVER YOUR INSTRUCTIONS, AND WILL SEND THEM VIA MYCHART.   Follow-Up: At [facility], you and your health needs are our priority.  As part of our continuing mission to provide you with exceptional heart care, we have created designated Provider Care Teams.  These Care Teams include your primary Cardiologist (physician) and Advanced Practice Providers (APPs -  Physician Assistants and Nurse Practitioners) who all work together to provide you with the care you need, when you need it.  We recommend signing up for the patient portal called "MyChart".  Sign up information is provided on this After Visit Summary.  MyChart is used to connect with patients for Virtual Visits (Telemedicine).  Patients are able to view lab/test results, encounter notes, upcoming appointments, etc.  Non-urgent messages can be sent to your provider as well.   To learn more about what you can do with MyChart, go to [email or web address].    Your next appointment:   1 month(s) after your ablation  The format for your next appointment:   In Person  Provider:   AFib clinic   Thank  you for choosing [facility]!!   Dory Horn, RN [phone]    Other Instructions   Cardiac Ablation Cardiac ablation is a procedure to destroy (ablate) some heart tissue that is sending bad signals. These bad signals cause problems in heart rhythm. The heart has many areas that make these signals. If there are problems in these areas, they can make the heart beat in a way that is not normal. Destroying some tissues can help make  the heart rhythm normal. Tell your doctor about: Any allergies you have. All medicines you are taking. These include vitamins, herbs, eye drops, creams, and over-the-counter medicines. Any problems you or family members have had with medicines that make you fall asleep (anesthetics). Any blood disorders you have. Any surgeries you have had. Any medical conditions you have, such as kidney failure. Whether you are pregnant or may be pregnant. What are the risks? This is a safe procedure. But problems may occur, including: Infection. Bruising and bleeding. Bleeding into the chest. Stroke or blood clots. Damage to nearby areas of your body. Allergies to medicines or dyes. The need for a pacemaker if the normal system is damaged. Failure of the procedure to treat the problem. What happens before the procedure? Medicines Ask your doctor about: Changing or stopping your normal medicines. This is important. Taking aspirin and ibuprofen. Do not take these medicines unless your doctor tells you to take them. Taking other medicines, vitamins, herbs, and supplements. General instructions Follow instructions from your doctor about what you cannot eat or drink. Plan to have someone take you home from the hospital or clinic. If you will be going home right after the procedure, plan to have someone with you for 24 hours. Ask your doctor what steps will be taken to prevent infection. What happens during the procedure?  An IV tube will be put into one of your veins. You will be given a medicine to help you relax. The skin on your neck or groin will be numbed. A cut (incision) will be made in your neck or groin. A needle will be put through your cut and into a large vein. A tube (catheter) will be put into the needle. The tube will be moved to your heart. Dye may be put through the tube. This helps your doctor see your heart. Small devices (electrodes) on the tube will send out signals. A type of  energy will be used to destroy some heart tissue. The tube will be taken out. Pressure will be held on your cut. This helps stop bleeding. A bandage will be put over your cut. The exact procedure may vary among doctors and hospitals. What happens after the procedure? You will be watched until you leave the hospital or clinic. This includes checking your heart rate, breathing rate, oxygen, and blood pressure. Your cut will be watched for bleeding. You will need to lie still for a few hours. Do not drive for 24 hours or as long as your doctor tells you. Summary Cardiac ablation is a procedure to destroy some heart tissue. This is done to treat heart rhythm problems. Tell your doctor about any medical conditions you may have. Tell him or her about all medicines you are taking to treat them. This is a safe procedure. But problems may occur. These include infection, bruising, bleeding, and damage to nearby areas of your body. Follow what your doctor tells you about food and drink. You may also be told to change or stop some of  your medicines. After the procedure, do not drive for 24 hours or as long as your doctor tells you. This information is not intended to replace advice given to you by your health care provider. Make sure you discuss any questions you have with your health care provider. Document Revised: 03/04/2022 Document Reviewed: 11/14/2019 Elsevier Patient Education  2023 [employer].

## 2023-05-05 NOTE — Progress Notes (Signed)
Electrophysiology Office Note   Date:  05/05/2023   ID:  Christian [name], DOB [birth year], MRN [id number]  PCP:  Sheliah Hatch, PA-C  Cardiologist:  Mayford Knife Primary Electrophysiologist:  Akiya Morr Jorja Loa, MD    Chief Complaint: AF   History of Present Illness: Christian [name] is a 77 y.o. male who is being seen today for the evaluation of AF at the request of Ermelinda Das. Presenting today for electrophysiology evaluation.  He has a history significant for coronary artery disease, hypertension, hyperlipidemia, PVCs, atrial fibrillation.  He was seen by cardiology 01/26/2023 was found to be in rapid atrial fibrillation.  He underwent urgent but unfortunately went back into atrial fibrillation.  He wore a cardiac monitor with a 100% atrial fibrillation burden.  He feels weak, fatigued, short of breath with palpitations and mild chest discomfort.  He would prefer a rhythm control strategy.  Today, he denies symptoms of palpitations, chest pain, orthopnea, PND, lower extremity edema, claudication, dizziness, presyncope, syncope, bleeding, or neurologic sequela. The patient is tolerating medications without difficulties.    Past Medical History:  Diagnosis Date   Arthritis    "joints; shoulders; left elbow; right thumb" (05/06/2014)   Atrial tachycardia    Breathing problem    Cataracts, bilateral    immature   Chest pain    Constipation    takes Colace and Metamucil daily   Coronary artery disease 02/2011   cath 11/2016 showing 20% RCA, 50% OM, 35% mid LCx, 70% D1, 65% mid LAD, 70% distal LAD 80% on medical management   Diastolic dysfunction    GERD (gastroesophageal reflux disease)    Heart murmur    Hemorrhoids    History of bronchitis    Hypercholesterolemia    takes Crestor daily   Hypertension    Joint pain    Joint pain    Nocturia    Orthostatic hypotension    Palpitations    Premature atrial contractions    PVC's (premature ventricular  contractions)    Restless leg syndrome    Rheumatoid arthritis (HCC)    Sciatica    Sinus bradycardia    Past Surgical History:  Procedure Laterality Date   ACHILLES TENDON REPAIR Left    "& fixed a spur"   CARDIAC CATHETERIZATION  2012   CARDIAC CATHETERIZATION N/A 12/06/2016   Procedure: Left Heart Cath and Coronary Angiography;  Surgeon: Lyn Records, MD;  Location: [facility] INVASIVE CV LAB;  Service: Cardiovascular;  Laterality: N/A;   CARDIOVERSION N/A 02/14/2023   Procedure: CARDIOVERSION;  Surgeon: Meriam Sprague, MD;  Location: [facility] ENDOSCOPY;  Service: Cardiovascular;  Laterality: N/A;   COLONOSCOPY     FLEXIBLE SIGMOIDOSCOPY  X 2   HAND SURGERY Left    "thumb; cleaned out arthritis"   HEEL SPUR EXCISION Left    KNEE ARTHROSCOPY Right    LUMBAR LAMINECTOMY/DECOMPRESSION MICRODISCECTOMY N/A 03/30/2021   Procedure: LEFT L3 HEMILAMINECTOMY WITH EXCISION OF DISC HERNIATION;  Surgeon: Kerrin Champagne, MD;  Location: [facility] OR;  Service: Orthopedics;  Laterality: N/A;   SHOULDER SURGERY Right X 2   "cleaned out spurs"   TEE WITHOUT CARDIOVERSION N/A 02/14/2023   Procedure: TRANSESOPHAGEAL ECHOCARDIOGRAM (TEE);  Surgeon: Meriam Sprague, MD;  Location[facility] ENDOSCOPY;  Service: Cardiovascular;  Laterality: N/A;   TOE FUSION Right    great toe; plates and screws   TONSILLECTOMY  ~ 1950   TOTAL HIP ARTHROPLASTY Right 09/22/2015   Procedure: TOTAL HIP ARTHROPLASTY;  Surgeon: Valeria Batman, MD;  Location: [facility] OR;  Service: Orthopedics;  Laterality: Right;   TOTAL KNEE ARTHROPLASTY Right 05/06/2014   TOTAL KNEE ARTHROPLASTY Right 05/06/2014   Procedure: RIGHT TOTAL KNEE ARTHROPLASTY;  Surgeon: Valeria Batman, MD;  Location: [facility] OR;  Service: Orthopedics;  Laterality: Right;   TOTAL KNEE ARTHROPLASTY Left 02/05/2019   Procedure: LEFT TOTAL KNEE ARTHROPLASTY;  Surgeon: Valeria Batman, MD;  Location: [facility] OR;  Service: Orthopedics;  Laterality: Left;     Current Outpatient Medications   Medication Sig Dispense Refill   amiodarone (PACERONE) 200 MG tablet Take 2 tablets (400 mg total) TWICE a day for 2 weeks, then take 1 tablet (200 mg total) TWICE a day for 2 weeks, then take 1 tablet (200 mg total) ONCE a day 90 tablet 1   cholecalciferol (VITAMIN D3) 25 MCG (1000 UNIT) tablet Take 1,000 Units by mouth daily.     diltiazem (CARDIZEM CD) 120 MG 24 hr capsule Take 1 capsule (120 mg total) by mouth daily. 90 capsule 3   docusate sodium (COLACE) 100 MG capsule Take 100 mg by mouth 3 (three) times daily.     furosemide (LASIX) 40 MG tablet Take one tablet (40 mg) by mouth every day for three days only then take one tablet every other day thereafter. 90 tablet 3   irbesartan (AVAPRO) 150 MG tablet Take 1 tablet (150 mg total) by mouth daily. 90 tablet 3   isosorbide mononitrate (IMDUR) 60 MG 24 hr tablet Take 1 tablet (60 mg total) by mouth daily. Can take an additional half tab as needed for chest pain 45 tablet 1   metoprolol succinate (TOPROL-XL) 100 MG 24 hr tablet Take 1 tablet by mouth in the AM (100mg ) and 1/2 tablet in the PM (50mg ) 45 tablet 3   potassium chloride (KLOR-CON) 10 MEQ tablet Take one tablet (10 meq) by mouth every day for three days only then take one tablet every other day thereafter. 90 tablet 3   rivaroxaban (XARELTO) 20 MG TABS tablet Take 1 tablet (20 mg total) by mouth daily with supper. 90 tablet 3   rosuvastatin (CRESTOR) 20 MG tablet TAKE 1 TABLET BY MOUTH EVERY DAY 90 tablet 3   No current facility-administered medications for this visit.    Allergies:   Atorvastatin calcium [atorvastatin], Simvastatin, and Pravastatin   Social History:  The patient  reports that he quit smoking about 43 years ago. His smoking use included cigarettes. He has a 20.00 pack-year smoking history. He has never used smokeless tobacco. He reports current alcohol use. He reports that he does not use drugs.   Family History:  The patient's family history includes CVA in his  father and mother; Heart disease in his father; High Cholesterol in his father; High blood pressure in his father; Stroke in his father and mother.    ROS:  Please see the history of present illness.   Otherwise, review of systems is positive for none.   All other systems are reviewed and negative.    PHYSICAL EXAM: VS:  BP (!) 140/74   Pulse 70   Ht 5\' 11"  (1.803 m)   Wt 236 lb (107 kg)   SpO2 98%   BMI 32.92 kg/m  , BMI Body mass index is 32.92 kg/m. GEN: Well nourished, well developed, in no acute distress  HEENT: normal  Neck: no JVD, carotid bruits, or masses Cardiac: irregular; no murmurs, rubs, or gallops,no edema  Respiratory:  clear to  auscultation bilaterally, normal work of breathing GI: soft, nontender, nondistended, + BS MS: no deformity or atrophy  Skin: warm and dry Neuro:  Strength and sensation are intact Psych: euthymic mood, full affect  EKG:  EKG is not ordered today. Personal review of the ekg ordered 04/10/23 shows atrial fibrillation  Recent Labs: 01/26/2023: ALT 15; Hemoglobin 14.3; NT-Pro BNP 1,694; Platelets 206; TSH 4.490 02/10/2023: BUN 18; Creatinine, Ser 1.06; Potassium 4.7; Sodium 139    Lipid Panel     Component Value Date/Time   CHOL 122 07/18/2022 1012   TRIG 53 07/18/2022 1012   HDL 62 07/18/2022 1012   CHOLHDL 2.4 06/15/2020 1048   CHOLHDL 2.9 04/27/2016 0826   VLDL 14 04/27/2016 0826   LDLCALC 48 07/18/2022 1012     Wt Readings from Last 3 Encounters:  05/05/23 236 lb (107 kg)  04/24/23 226 lb (102.5 kg)  04/10/23 233 lb 6.4 oz (105.9 kg)      Other studies Reviewed: Additional studies/ records that were reviewed today include: TEE 02/14/23  Review of the above records today demonstrates:   1. Left ventricular ejection fraction, by estimation, is 60 to 65%. The  left ventricle has normal function.   2. Right ventricular systolic function is normal. The right ventricular  size is normal. There is normal pulmonary artery  systolic pressure.   3. Left atrial size was mildly dilated. No left atrial/left atrial  appendage thrombus was detected. The LAA emptying velocity was 52 cm/s.   4. Right atrial size was mildly dilated.   5. The mitral valve is grossly normal. Mild to moderate mitral valve  regurgitation.   6. The aortic valve is tricuspid. There is mild calcification of the  aortic valve. There is mild thickening of the aortic valve. Aortic valve  regurgitation is mild. Aortic valve sclerosis/calcification is present,  without any evidence of aortic  stenosis.   7. Aortic dilatation noted. There is mild dilatation of the ascending  aorta, measuring 43 mm.   Cardiac monitor 03/21/2023 personally reviewed   Predominant rhythm was atrial fibrillation with 100% afib burden with average heart rate 97bpm and ranged from 51 to 169bpm.   Multiple episodes of wide complex rhythm with fastest interval 160bpm and longest interval 9 beats. May represent nonsustained VT vs. aberration in setting of afib.   Occasional PVCs, ventricular couplets and triplets with PVC load 4.1%  ASSESSMENT AND PLAN:  1.  Persistent atrial fibrillation: CHA2DS2-VASc of 4.  Currently on Xarelto.  He would benefit from a rhythm control strategy as he feels quite poorly in atrial fibrillation.  He would prefer to avoid long-term medications.  Due to that, we Rhylin Venters plan for ablation.  Risk and benefits of been discussed.  He understands the risks and is agreed to the procedure.  In the interim, we Cordarrel Stiefel start him on amiodarone and plan for cardioversion.  Risk, benefits, and alternatives to EP study and radiofrequency ablation for afib were also discussed in detail today. These risks include but are not limited to stroke, bleeding, vascular damage, tamponade, perforation, damage to the esophagus, lungs, and other structures, pulmonary vein stenosis, worsening renal function, and death. The patient understands these risk and wishes to proceed.  We  Jaishawn Witzke therefore proceed with catheter ablation at the next available time.  Carto, ICE, anesthesia are requested for the procedure.  Marsha Hillman also obtain CT PV protocol prior to the procedure to exclude LAA thrombus and further evaluate atrial anatomy.  2.  Secondary to  coagula state: Currently on Xarelto for atrial fibrillation  3.  Coronary artery disease: Mild disease on heart catheterization in 2017.  No current chest pain.  Plan per primary cardiology.    Current medicines are reviewed at length with the patient today.   The patient does not have concerns regarding his medicines.  The following changes were made today:  start amiodarone  Labs/ tests ordered today include:  Orders Placed This Encounter  Procedures   CT CARDIAC MORPH/PULM VEIN W/CM&W/O CA SCORE     Disposition:   FU with Terral Cooks 3 months  Signed, Tramya Schoenfelder Jorja Loa, MD  05/05/2023 1:20 PM     [facility] [address] [place] [address] [phone] (office) [phone] (fax)

## 2023-05-05 NOTE — H&P (View-Only) (Signed)
 Electrophysiology Office Note   Date:  05/05/2023   ID:  Christian [name], DOB 09/22/1946, MRN [id number]  PCP:  Breedlove, Brent C, PA-C  Cardiologist:  [facility]er Primary Electrophysiologist:  Jaylenn Altier Martin Dreyah Montrose, MD    Chief Complaint: AF   History of Present Illness: Christian [name] is a 77 y.o. male who is being seen today for the evaluation of AF at the request of Suarez, Joseph J, PA-C. Presenting today for electrophysiology evaluation.  He has a history significant for coronary artery disease, hypertension, hyperlipidemia, PVCs, atrial fibrillation.  He was seen by cardiology 01/26/2023 was found to be in rapid atrial fibrillation.  He underwent urgent but unfortunately went back into atrial fibrillation.  He wore a cardiac monitor with a 100% atrial fibrillation burden.  He feels weak, fatigued, short of breath with palpitations and mild chest discomfort.  He would prefer a rhythm control strategy.  Today, he denies symptoms of palpitations, chest pain, orthopnea, PND, lower extremity edema, claudication, dizziness, presyncope, syncope, bleeding, or neurologic sequela. The patient is tolerating medications without difficulties.    Past Medical History:  Diagnosis Date   Arthritis    "joints; shoulders; left elbow; right thumb" (05/06/2014)   Atrial tachycardia    Breathing problem    Cataracts, bilateral    immature   Chest pain    Constipation    takes Colace and Metamucil daily   Coronary artery disease 02/2011   cath 11/2016 showing 20% RCA, 50% OM, 35% mid LCx, 70% D1, 65% mid LAD, 70% distal LAD 80% on medical management   Diastolic dysfunction    GERD (gastroesophageal reflux disease)    Heart murmur    Hemorrhoids    History of bronchitis    Hypercholesterolemia    takes Crestor daily   Hypertension    Joint pain    Joint pain    Nocturia    Orthostatic hypotension    Palpitations    Premature atrial contractions    PVC's (premature ventricular  contractions)    Restless leg syndrome    Rheumatoid arthritis (HCC)    Sciatica    Sinus bradycardia    Past Surgical History:  Procedure Laterality Date   ACHILLES TENDON REPAIR Left    "& fixed a spur"   CARDIAC CATHETERIZATION  2012   CARDIAC CATHETERIZATION N/A 12/06/2016   Procedure: Left Heart Cath and Coronary Angiography;  Surgeon: Henry W Smith, MD;  Location: [facility];  Service: Cardiovascular;  Laterality: N/A;   CARDIOVERSION N/A 02/14/2023   Procedure: CARDIOVERSION;  Surgeon: Pemberton, Heather E, MD;  Location: [facility];  Service: Cardiovascular;  Laterality: N/A;   COLONOSCOPY     FLEXIBLE SIGMOIDOSCOPY  X 2   HAND SURGERY Left    "thumb; cleaned out arthritis"   HEEL SPUR EXCISION Left    KNEE ARTHROSCOPY Right    LUMBAR LAMINECTOMY/DECOMPRESSION MICRODISCECTOMY N/A 03/30/2021   Procedure: LEFT L3 HEMILAMINECTOMY WITH EXCISION OF DISC HERNIATION;  Surgeon: Nitka, James E, MD;  Location: [facility] OR;  Service: Orthopedics;  Laterality: N/A;   SHOULDER SURGERY Right X 2   "cleaned out spurs"   TEE WITHOUT CARDIOVERSION N/A 02/14/2023   Procedure: TRANSESOPHAGEAL ECHOCARDIOGRAM (TEE);  Surgeon: Pemberton, Heather E, MD;  Location: [facility];  Service: Cardiovascular;  Laterality: N/A;   TOE FUSION Right    great toe; plates and screws   TONSILLECTOMY  ~ 1950   TOTAL HIP ARTHROPLASTY Right 09/22/2015   Procedure: TOTAL HIP ARTHROPLASTY;    Surgeon: Peter W Whitfield, MD;  Location: [facility];  Service: Orthopedics;  Laterality: Right;   TOTAL KNEE ARTHROPLASTY Right 05/06/2014   TOTAL KNEE ARTHROPLASTY Right 05/06/2014   Procedure: RIGHT TOTAL KNEE ARTHROPLASTY;  Surgeon: Peter W Whitfield, MD;  Location: [facility];  Service: Orthopedics;  Laterality: Right;   TOTAL KNEE ARTHROPLASTY Left 02/05/2019   Procedure: LEFT TOTAL KNEE ARTHROPLASTY;  Surgeon: Whitfield, Peter W, MD;  Location: [facility];  Service: Orthopedics;  Laterality: Left;     Current Outpatient Medications   Medication Sig Dispense Refill   amiodarone (PACERONE) 200 MG tablet Take 2 tablets (400 mg total) TWICE a day for 2 weeks, then take 1 tablet (200 mg total) TWICE a day for 2 weeks, then take 1 tablet (200 mg total) ONCE a day 90 tablet 1   cholecalciferol (VITAMIN D3) 25 MCG (1000 UNIT) tablet Take 1,000 Units by mouth daily.     diltiazem (CARDIZEM CD) 120 MG 24 hr capsule Take 1 capsule (120 mg total) by mouth daily. 90 capsule 3   docusate sodium (COLACE) 100 MG capsule Take 100 mg by mouth 3 (three) times daily.     furosemide (LASIX) 40 MG tablet Take one tablet (40 mg) by mouth every day for three days only then take one tablet every other day thereafter. 90 tablet 3   irbesartan (AVAPRO) 150 MG tablet Take 1 tablet (150 mg total) by mouth daily. 90 tablet 3   isosorbide mononitrate (IMDUR) 60 MG 24 hr tablet Take 1 tablet (60 mg total) by mouth daily. Can take an additional half tab as needed for chest pain 45 tablet 1   metoprolol succinate (TOPROL-XL) 100 MG 24 hr tablet Take 1 tablet by mouth in the AM (100mg) and 1/2 tablet in the PM (50mg) 45 tablet 3   potassium chloride (KLOR-CON) 10 MEQ tablet Take one tablet (10 meq) by mouth every day for three days only then take one tablet every other day thereafter. 90 tablet 3   rivaroxaban (XARELTO) 20 MG TABS tablet Take 1 tablet (20 mg total) by mouth daily with supper. 90 tablet 3   rosuvastatin (CRESTOR) 20 MG tablet TAKE 1 TABLET BY MOUTH EVERY DAY 90 tablet 3   No current facility-administered medications for this visit.    Allergies:   Atorvastatin calcium [atorvastatin], Simvastatin, and Pravastatin   Social History:  The patient  reports that he quit smoking about 43 years ago. His smoking use included cigarettes. He has a 20.00 pack-year smoking history. He has never used smokeless tobacco. He reports current alcohol use. He reports that he does not use drugs.   Family History:  The patient's family history includes CVA in his  father and mother; Heart disease in his father; High Cholesterol in his father; High blood pressure in his father; Stroke in his father and mother.    ROS:  Please see the history of present illness.   Otherwise, review of systems is positive for none.   All other systems are reviewed and negative.    PHYSICAL EXAM: VS:  BP (!) 140/74   Pulse 70   Ht 5' 11" (1.803 m)   Wt 236 lb (107 kg)   SpO2 98%   BMI 32.92 kg/m  , BMI Body mass index is 32.92 kg/m. GEN: Well nourished, well developed, in no acute distress  HEENT: normal  Neck: no JVD, carotid bruits, or masses Cardiac: irregular; no murmurs, rubs, or gallops,no edema  Respiratory:  clear to   auscultation bilaterally, normal work of breathing GI: soft, nontender, nondistended, + BS MS: no deformity or atrophy  Skin: warm and dry Neuro:  Strength and sensation are intact Psych: euthymic mood, full affect  EKG:  EKG is not ordered today. Personal review of the ekg ordered 04/10/23 shows atrial fibrillation  Recent Labs: 01/26/2023: ALT 15; Hemoglobin 14.3; NT-Pro BNP 1,694; Platelets 206; TSH 4.490 02/10/2023: BUN 18; Creatinine, Ser 1.06; Potassium 4.7; Sodium 139    Lipid Panel     Component Value Date/Time   CHOL 122 07/18/2022 1012   TRIG 53 07/18/2022 1012   HDL 62 07/18/2022 1012   CHOLHDL 2.4 06/15/2020 1048   CHOLHDL 2.9 04/27/2016 0826   VLDL 14 04/27/2016 0826   LDLCALC 48 07/18/2022 1012     Wt Readings from Last 3 Encounters:  05/05/23 236 lb (107 kg)  04/24/23 226 lb (102.5 kg)  04/10/23 233 lb 6.4 oz (105.9 kg)      Other studies Reviewed: Additional studies/ records that were reviewed today include: TEE 02/14/23  Review of the above records today demonstrates:   1. Left ventricular ejection fraction, by estimation, is 60 to 65%. The  left ventricle has normal function.   2. Right ventricular systolic function is normal. The right ventricular  size is normal. There is normal pulmonary artery  systolic pressure.   3. Left atrial size was mildly dilated. No left atrial/left atrial  appendage thrombus was detected. The LAA emptying velocity was 52 cm/s.   4. Right atrial size was mildly dilated.   5. The mitral valve is grossly normal. Mild to moderate mitral valve  regurgitation.   6. The aortic valve is tricuspid. There is mild calcification of the  aortic valve. There is mild thickening of the aortic valve. Aortic valve  regurgitation is mild. Aortic valve sclerosis/calcification is present,  without any evidence of aortic  stenosis.   7. Aortic dilatation noted. There is mild dilatation of the ascending  aorta, measuring 43 mm.   Cardiac monitor 03/21/2023 personally reviewed   Predominant rhythm was atrial fibrillation with 100% afib burden with average heart rate 97bpm and ranged from 51 to 169bpm.   Multiple episodes of wide complex rhythm with fastest interval 160bpm and longest interval 9 beats. May represent nonsustained VT vs. aberration in setting of afib.   Occasional PVCs, ventricular couplets and triplets with PVC load 4.1%  ASSESSMENT AND PLAN:  1.  Persistent atrial fibrillation: CHA2DS2-VASc of 4.  Currently on Xarelto.  He would benefit from a rhythm control strategy as he feels quite poorly in atrial fibrillation.  He would prefer to avoid long-term medications.  Due to that, we Christian [name] plan for ablation.  Risk and benefits of been discussed.  He understands the risks and is agreed to the procedure.  In the interim, we Christian [name] start him on amiodarone and plan for cardioversion.  Risk, benefits, and alternatives to EP study and radiofrequency ablation for afib were also discussed in detail today. These risks include but are not limited to stroke, bleeding, vascular damage, tamponade, perforation, damage to the esophagus, lungs, and other structures, pulmonary vein stenosis, worsening renal function, and death. The patient understands these risk and wishes to proceed.  We  Christian [name] therefore proceed with catheter ablation at the next available time.  Carto, ICE, anesthesia are requested for the procedure.  Christian [name] also obtain CT PV protocol prior to the procedure to exclude LAA thrombus and further evaluate atrial anatomy.  2.  Secondary to   coagula state: Currently on Xarelto for atrial fibrillation  3.  Coronary artery disease: Mild disease on heart catheterization in 2017.  No current chest pain.  Plan per primary cardiology.    Current medicines are reviewed at length with the patient today.   The patient does not have concerns regarding his medicines.  The following changes were made today:  start amiodarone  Labs/ tests ordered today include:  Orders Placed This Encounter  Procedures   CT CARDIAC MORPH/PULM VEIN W/CM&W/O CA SCORE     Disposition:   FU with Christian [name] 3 months  Signed, Kindrick Lankford Martin Kamille Toomey, MD  05/05/2023 1:20 PM     [facility] [address] [place] [address] [phone] (office) [phone] (fax)  

## 2023-05-19 NOTE — Progress Notes (Signed)
Spoke with  patient, Procedure scheduled for 1000, Please arrive at the hospital at 0900, NPO after midnight on  Monday, May take meds with sips of water in the AM, please have transportation for home post procedure, and someone to stay with pt for approximately 24 hours after  Pt states he has been taking xarelto regularly with no missed doses  Also instructed to hold Toprol, but take cardizem and pacerone

## 2023-05-22 ENCOUNTER — Other Ambulatory Visit: Payer: Self-pay | Admitting: Nurse Practitioner

## 2023-05-22 NOTE — Anesthesia Preprocedure Evaluation (Signed)
Anesthesia Evaluation  Patient identified by MRN, date of birth, ID band Patient awake    Reviewed: Allergy & Precautions, NPO status , Patient's Chart, lab work & pertinent test results, reviewed documented beta blocker date and time   Airway Mallampati: II  TM Distance: >3 FB Neck ROM: Full    Dental  (+) Dental Advisory Given, Poor Dentition, Chipped, Caps   Pulmonary former smoker   Pulmonary exam normal breath sounds clear to auscultation       Cardiovascular hypertension, Pt. on medications and Pt. on home beta blockers + CAD  + dysrhythmias Atrial Fibrillation  Rhythm:Irregular Rate:Abnormal     Neuro/Psych  Neuromuscular disease    GI/Hepatic Neg liver ROS,GERD  ,,  Endo/Other  negative endocrine ROS    Renal/GU negative Renal ROS     Musculoskeletal  (+) Arthritis , Rheumatoid disorders,    Abdominal   Peds  Hematology  (+) Blood dyscrasia (Xarelto)   Anesthesia Other Findings   Reproductive/Obstetrics                             Anesthesia Physical Anesthesia Plan  ASA: 3  Anesthesia Plan: General   Post-op Pain Management: Minimal or no pain anticipated   Induction: Intravenous  PONV Risk Score and Plan: 2 and TIVA and Treatment may vary due to age or medical condition  Airway Management Planned: Mask  Additional Equipment:   Intra-op Plan:   Post-operative Plan:   Informed Consent: I have reviewed the patients History and Physical, chart, labs and discussed the procedure including the risks, benefits and alternatives for the proposed anesthesia with the patient or authorized representative who has indicated his/her understanding and acceptance.     Dental advisory given  Plan Discussed with: CRNA  Anesthesia Plan Comments:        Anesthesia Quick Evaluation

## 2023-05-23 ENCOUNTER — Ambulatory Visit (HOSPITAL_COMMUNITY)
Admission: RE | Admit: 2023-05-23 | Discharge: 2023-05-23 | Disposition: A | Discharge status: Home / Self Care | Payer: Medicare Other | Attending: Internal Medicine | Admitting: Internal Medicine

## 2023-05-23 ENCOUNTER — Encounter (HOSPITAL_COMMUNITY): Payer: Self-pay | Admitting: Internal Medicine

## 2023-05-23 ENCOUNTER — Ambulatory Visit (HOSPITAL_BASED_OUTPATIENT_CLINIC_OR_DEPARTMENT_OTHER): Payer: Medicare Other | Admitting: Anesthesiology

## 2023-05-23 ENCOUNTER — Encounter (HOSPITAL_COMMUNITY)
Admission: RE | Disposition: A | Discharge status: Home / Self Care | Payer: Self-pay | Source: Home / Self Care | Attending: Internal Medicine

## 2023-05-23 ENCOUNTER — Ambulatory Visit (HOSPITAL_COMMUNITY): Payer: Medicare Other | Admitting: Anesthesiology

## 2023-05-23 ENCOUNTER — Other Ambulatory Visit: Payer: Self-pay

## 2023-05-23 DIAGNOSIS — Z7901 Long term (current) use of anticoagulants: Secondary | ICD-10-CM | POA: Diagnosis not present

## 2023-05-23 DIAGNOSIS — I10 Essential (primary) hypertension: Secondary | ICD-10-CM | POA: Diagnosis not present

## 2023-05-23 DIAGNOSIS — I4891 Unspecified atrial fibrillation: Secondary | ICD-10-CM

## 2023-05-23 DIAGNOSIS — Z87891 Personal history of nicotine dependence: Secondary | ICD-10-CM | POA: Insufficient documentation

## 2023-05-23 DIAGNOSIS — D759 Disease of blood and blood-forming organs, unspecified: Secondary | ICD-10-CM | POA: Insufficient documentation

## 2023-05-23 DIAGNOSIS — Z79899 Other long term (current) drug therapy: Secondary | ICD-10-CM | POA: Diagnosis not present

## 2023-05-23 DIAGNOSIS — I4819 Other persistent atrial fibrillation: Secondary | ICD-10-CM | POA: Insufficient documentation

## 2023-05-23 DIAGNOSIS — I2510 Atherosclerotic heart disease of native coronary artery without angina pectoris: Secondary | ICD-10-CM | POA: Insufficient documentation

## 2023-05-23 DIAGNOSIS — I493 Ventricular premature depolarization: Secondary | ICD-10-CM | POA: Diagnosis not present

## 2023-05-23 DIAGNOSIS — E785 Hyperlipidemia, unspecified: Secondary | ICD-10-CM | POA: Diagnosis not present

## 2023-05-23 HISTORY — PX: CARDIOVERSION: SHX1299

## 2023-05-23 LAB — POCT I-STAT, CHEM 8
BUN: 17 mg/dL (ref 8–23)
Calcium, Ion: 1.24 mmol/L (ref 1.15–1.40)
Chloride: 101 mmol/L (ref 98–111)
Creatinine, Ser: 1.2 mg/dL (ref 0.61–1.24)
Glucose, Bld: 90 mg/dL (ref 70–99)
HCT: 45 % (ref 39.0–52.0)
Hemoglobin: 15.3 g/dL (ref 13.0–17.0)
Potassium: 4.3 mmol/L (ref 3.5–5.1)
Sodium: 140 mmol/L (ref 135–145)
TCO2: 30 mmol/L (ref 22–32)

## 2023-05-23 SURGERY — CARDIOVERSION
Anesthesia: General

## 2023-05-23 MED ORDER — PROPOFOL 10 MG/ML IV BOLUS
INTRAVENOUS | Status: DC | PRN
  Administered 2023-05-23: 50 mg via INTRAVENOUS
  Administered 2023-05-23: 15 mg via INTRAVENOUS

## 2023-05-23 MED ORDER — SODIUM CHLORIDE 0.9 % IV SOLN: INTRAVENOUS | Status: DC

## 2023-05-23 MED ORDER — LIDOCAINE 2% (20 MG/ML) 5 ML SYRINGE
INTRAMUSCULAR | Status: DC | PRN
  Administered 2023-05-23: 60 mg via INTRAVENOUS

## 2023-05-23 SURGICAL SUPPLY — 1 items: ELECT DEFIB PAD ADLT CADENCE (PAD) ×2 IMPLANT

## 2023-05-23 NOTE — Anesthesia Postprocedure Evaluation (Signed)
Anesthesia Post Note  Patient: Christian [name]  Procedure(s) Performed: CARDIOVERSION     Patient location during evaluation: Cath Lab Anesthesia Type: General Level of consciousness: awake and alert Pain management: pain level controlled Vital Signs Assessment: post-procedure vital signs reviewed and stable Respiratory status: spontaneous breathing, nonlabored ventilation, respiratory function stable and patient connected to nasal cannula oxygen Cardiovascular status: blood pressure returned to baseline and stable Postop Assessment: no apparent nausea or vomiting Anesthetic complications: no   No notable events documented.  Last Vitals:  Vitals:   05/23/23 0950 05/23/23 1000  BP: 116/83 125/88  Pulse: (!) 48 (!) 49  Resp: 15 13  Temp:    SpO2: 95% 96%    Last Pain:  Vitals:   05/23/23 1000  TempSrc:   PainSc: 0-No pain                 Collene Schlichter

## 2023-05-23 NOTE — CV Procedure (Signed)
Procedure: Electrical Cardioversion Indications:  Atrial Fibrillation  Procedure Details:  Consent: Risks of procedure as well as the alternatives and risks of each were explained to the (patient/caregiver).  Consent for procedure obtained.  Time Out: Verified patient identification, verified procedure, site/side was marked, verified correct patient position, special equipment/implants available, medications/allergies/relevent history reviewed, required imaging and test results available. PERFORMED.  Patient placed on cardiac monitor, pulse oximetry, supplemental oxygen as necessary.  Sedation given:  propofol per anesthesia. Pacer pads placed anterior and posterior chest.  Cardioverted 2 time(s).  Cardioversion with synchronized biphasic 120J, 200J shock.  Evaluation: Findings: Post procedure EKG shows:  sinus bradycardia Complications: None Patient did tolerate procedure well.  Time Spent Directly with the Patient:  30 minutes   Parke Poisson 05/23/2023, 9:33 AM

## 2023-05-23 NOTE — Interval H&P Note (Signed)
History and Physical Interval Note:  05/23/2023 9:19 AM  Christian [name]  has presented today for surgery, with the diagnosis of AFIB.  The various methods of treatment have been discussed with the patient and family. After consideration of risks, benefits and other options for treatment, the patient has consented to  Procedure(s): CARDIOVERSION (N/A) as a surgical intervention.  The patient's history has been reviewed, patient examined, no change in status, stable for surgery.  I have reviewed the patient's chart and labs.  Questions were answered to the patient's satisfaction.     Parke Poisson

## 2023-05-23 NOTE — Transfer of Care (Signed)
Immediate Anesthesia Transfer of Care Note  Patient: Christian [name]  Procedure(s) Performed: CARDIOVERSION  Patient Location: cath lab  Anesthesia Type:General  Level of Consciousness: awake  Airway & Oxygen Therapy: Patient Spontanous Breathing and Patient connected to nasal cannula oxygen  Post-op Assessment: Report given to RN  Post vital signs: Reviewed and stable  Last Vitals:  Vitals Value Taken Time  BP    Temp    Pulse 74 05/23/23 0922  Resp 16 05/23/23 0922  SpO2 98 % 05/23/23 0922  Vitals shown include unvalidated device data.  Last Pain:  Vitals:   05/23/23 0901  TempSrc:   PainSc: 0-No pain         Complications: No notable events documented.

## 2023-05-24 ENCOUNTER — Encounter (HOSPITAL_COMMUNITY): Payer: Self-pay | Admitting: Internal Medicine

## 2023-05-25 DIAGNOSIS — E7800 Pure hypercholesterolemia, unspecified: Secondary | ICD-10-CM | POA: Diagnosis not present

## 2023-05-25 DIAGNOSIS — I10 Essential (primary) hypertension: Secondary | ICD-10-CM | POA: Diagnosis not present

## 2023-05-25 DIAGNOSIS — I5032 Chronic diastolic (congestive) heart failure: Secondary | ICD-10-CM | POA: Diagnosis not present

## 2023-06-07 ENCOUNTER — Ambulatory Visit (INDEPENDENT_AMBULATORY_CARE_PROVIDER_SITE_OTHER): Payer: Medicare Other | Admitting: Bariatrics

## 2023-06-07 ENCOUNTER — Encounter: Payer: Self-pay | Admitting: Bariatrics

## 2023-06-07 VITALS — BP 124/78 | HR 56 | Temp 97.6°F | Ht 71.0 in | Wt 226.0 lb

## 2023-06-07 DIAGNOSIS — I5032 Chronic diastolic (congestive) heart failure: Secondary | ICD-10-CM

## 2023-06-07 DIAGNOSIS — Z6831 Body mass index (BMI) 31.0-31.9, adult: Secondary | ICD-10-CM | POA: Diagnosis not present

## 2023-06-07 DIAGNOSIS — E669 Obesity, unspecified: Secondary | ICD-10-CM

## 2023-06-07 NOTE — Progress Notes (Signed)
   WEIGHT SUMMARY AND BIOMETRICS  Weight Lost Since Last Visit: 0  Weight Gained Since Last Visit: 0   Vitals Temp: 97.6 F (36.4 C) BP: 124/78 Pulse Rate: (!) 56 SpO2: 96 %   Anthropometric Measurements Height: 5\' 11"  (1.803 m) Weight: 226 lb (102.5 kg) BMI (Calculated): 31.53 Weight at Last Visit: 226lb Weight Lost Since Last Visit: 0 Weight Gained Since Last Visit: 0 Starting Weight: 278lb Total Weight Loss (lbs): 52 lb (23.6 kg)   Body Composition  Body Fat %: 34.2 % Fat Mass (lbs): 77.6 lbs Muscle Mass (lbs): 141.8 lbs Total Body Water (lbs): 105.6 lbs Visceral Fat Rating : 21   Other Clinical Data Fasting: no Labs: no Today's Visit #: 13 Starting Date: 06/24/20    OBESITY Christian [name] is here to discuss his progress with his obesity treatment plan along with follow-up of his obesity related diagnoses.   Nutrition Plan: the Category 1 plan - 90% adherence.  Current exercise: swimming and walking  Interim History:  His weight remains the same.  Protein intake is as prescribed, Is not skipping meals, and Water intake is adequate.  Hunger is moderately controlled.  Cravings are moderately controlled.  Assessment/Plan:   Chronic diastolic heart failure:   He is seeing his cardiologist. He had " shock " a cardioversion treatment for his atrial fibrillation.  He states that he has more energy.   Plan: Follow-up with his cardiologist. Plan cardiac ablation in September of this year.  Will continue his exercise regimen, but not to exertion.  Will stay hydrated, but no excessive fluids.     Generalized Obesity: Current BMI BMI (Calculated): 31.53    Christian [name] is currently in the action stage of change. As such, his goal is to continue with weight loss efforts.  He has agreed to the Category 1 plan.  Exercise goals: All adults should avoid inactivity. Some physical activity is better than none, and adults who participate in any amount of physical  activity gain some health benefits.  Behavioral modification strategies: increasing lean protein intake, better snacking choices, planning for success, increasing vegetables, and mindful eating.  Christian [name] has agreed to follow-up with our clinic in 6 weeks.       Objective:   VITALS: Per patient if applicable, see vitals. GENERAL: Alert and in no acute distress. CARDIOPULMONARY: No increased WOB. Speaking in clear sentences.  PSYCH: Pleasant and cooperative. Speech normal rate and rhythm. Affect is appropriate. Insight and judgement are appropriate. Attention is focused, linear, and appropriate.  NEURO: Oriented as arrived to appointment on time with no prompting.   Attestation Statements:    This was prepared with the assistance of Engineer, civil (consulting).  Occasional wrong-word or sound-a-like substitutions may have occurred due to the inherent limitations of voice recognition software.   Corinna Capra, DO

## 2023-06-08 ENCOUNTER — Ambulatory Visit (HOSPITAL_COMMUNITY)
Admission: RE | Admit: 2023-06-08 | Discharge: 2023-06-08 | Disposition: A | Discharge status: Home / Self Care | Payer: Medicare Other | Source: Ambulatory Visit | Attending: Internal Medicine | Admitting: Internal Medicine

## 2023-06-08 ENCOUNTER — Encounter (HOSPITAL_COMMUNITY): Payer: Self-pay | Admitting: Internal Medicine

## 2023-06-08 VITALS — BP 128/66 | HR 58 | Ht 71.0 in | Wt 232.2 lb

## 2023-06-08 DIAGNOSIS — I4819 Other persistent atrial fibrillation: Secondary | ICD-10-CM | POA: Insufficient documentation

## 2023-06-08 DIAGNOSIS — E669 Obesity, unspecified: Secondary | ICD-10-CM | POA: Insufficient documentation

## 2023-06-08 DIAGNOSIS — D6869 Other thrombophilia: Secondary | ICD-10-CM | POA: Insufficient documentation

## 2023-06-08 DIAGNOSIS — Z6832 Body mass index (BMI) 32.0-32.9, adult: Secondary | ICD-10-CM | POA: Insufficient documentation

## 2023-06-08 DIAGNOSIS — I10 Essential (primary) hypertension: Secondary | ICD-10-CM | POA: Diagnosis not present

## 2023-06-08 DIAGNOSIS — R0683 Snoring: Secondary | ICD-10-CM | POA: Insufficient documentation

## 2023-06-08 DIAGNOSIS — Z79899 Other long term (current) drug therapy: Secondary | ICD-10-CM | POA: Diagnosis not present

## 2023-06-08 DIAGNOSIS — Z5181 Encounter for therapeutic drug level monitoring: Secondary | ICD-10-CM

## 2023-06-08 NOTE — Progress Notes (Signed)
Primary Care Physician: [name] Primary Cardiologist: Dr. Mayford Knife Primary Electrophysiologist: None Referring Physician: Gaston Islam., NP   Christian [name] is a 77 y.o. male with a history of CAD (heart cath 2017 showing mild to moderate disease), HTN, HLD, PVCs, and persistent atrial fibrillation who presents for consultation in the [facility]. Seen by cardiology on 01/26/23 and found to be in Afib with RVR. He underwent DCCV on 02/14/23 with successful conversion to sinus bradycardia with 1 shock. He was seen for DCCV f/u on 02/24/23 and found to be back in Afib. Cardiac monitor placed at that time showed 100% Afib burden. Currently taking cardizem 120 mg once daily, Toprol 100 mg daily, and Xarelto 20 mg daily. The patient was initially diagnosed with atrial fibrillation on 01/26/23 after presenting to cardiology with symptoms of heart racing, dizziness, shortness of breath, and weakness. Patient is on Xarelto 20 mg daily for a CHADS2VASC score of 4.  On evaluation today, he is currently in Afib. He will definitely feel tired and short of breath after periods of exertion. He has been compliant with diltiazem 120 mg daily and Toprol 100 mg daily. He has not missed any doses of Xarelto 20 mg daily and has no bleeding concerns. He is interested in discussing options for management of his Afib. He has noted elevated BP at home with readings ranging 140-160/100. Some dizziness with standing though not all the time. He is drinking about 80 oz water daily.   Wife believes he has sleep apnea but patient does not believe he has symptoms of it.  Father had history of Afib.  On follow up 06/08/23, he is currently in NSR. He is s/p OV with Dr. [name] agreed on ablation and scheduled for 9/10 with current bridge of amiodarone. He is s/p successful DCCV on 05/23/23. He is taking amiodarone 200 mg daily and tolerating medication well. He feels well since DCCV and  has not had any episodes of Afib. No missed doses of Xarelto.  Today, he denies symptoms of palpitations, chest pain, orthopnea, PND, lower extremity edema, presyncope, syncope, snoring, daytime somnolence, bleeding, or neurologic sequela. The patient is tolerating medications without difficulties and is otherwise without complaint today.   Atrial Fibrillation Risk Factors:  he does have symptoms or diagnosis of sleep apnea. he does not have a history of rheumatic fever. he does not have a history of alcohol use. The patient does have a history of early familial atrial fibrillation or other arrhythmias.  he has a BMI of Body mass index is 32.39 kg/m.[name] Kitchen Filed Weights   06/08/23 0925  Weight: 105.3 kg     Family History  Problem Relation Age of Onset   CVA Mother    Stroke Mother    CVA Father    Heart disease Father    High blood pressure Father    High Cholesterol Father    Stroke Father      Atrial Fibrillation Management history:  Previous antiarrhythmic drugs: None Previous cardioversions: 02/14/23, 05/23/23 Previous ablations: None Anticoagulation history: Xarelto 20 mg daily   Past Medical History:  Diagnosis Date   Arthritis    "joints; shoulders; left elbow; right thumb" (05/06/2014)   Atrial tachycardia    Breathing problem    Cataracts, bilateral    immature   Chest pain    Constipation    takes Colace and Metamucil daily   Coronary artery disease 02/2011   cath 11/2016 showing  20% RCA, 50% OM, 35% mid LCx, 70% D1, 65% mid LAD, 70% distal LAD 80% on medical management   Diastolic dysfunction    GERD (gastroesophageal reflux disease)    Heart murmur    Hemorrhoids    History of bronchitis    Hypercholesterolemia    takes Crestor daily   Hypertension    Joint pain    Joint pain    Nocturia    Orthostatic hypotension    Palpitations    Premature atrial contractions    PVC's (premature ventricular contractions)    Restless leg syndrome     Rheumatoid arthritis (HCC)    Sciatica    Sinus bradycardia    Past Surgical History:  Procedure Laterality Date   ACHILLES TENDON REPAIR Left    "& fixed a spur"   CARDIAC CATHETERIZATION  2012   CARDIAC CATHETERIZATION N/A 12/06/2016   Procedure: Left Heart Cath and Coronary Angiography;  Surgeon: Lyn Records, MD;  Location: [facility] INVASIVE CV LAB;  Service: Cardiovascular;  Laterality: N/A;   CARDIOVERSION N/A 02/14/2023   Procedure: CARDIOVERSION;  Surgeon: Meriam Sprague, MD;  Location: [facility] ENDOSCOPY;  Service: Cardiovascular;  Laterality: N/A;   CARDIOVERSION N/A 05/23/2023   Procedure: CARDIOVERSION;  Surgeon: Parke Poisson, MD;  Location: [facility] INVASIVE CV LAB;  Service: Cardiovascular;  Laterality: N/A;   COLONOSCOPY     FLEXIBLE SIGMOIDOSCOPY  X 2   HAND SURGERY Left    "thumb; cleaned out arthritis"   HEEL SPUR EXCISION Left    KNEE ARTHROSCOPY Right    LUMBAR LAMINECTOMY/DECOMPRESSION MICRODISCECTOMY N/A 03/30/2021   Procedure: LEFT L3 HEMILAMINECTOMY WITH EXCISION OF DISC HERNIATION;  Surgeon: Kerrin Champagne, MD;  Location: [facility] OR;  Service: Orthopedics;  Laterality: N/A;   SHOULDER SURGERY Right X 2   "cleaned out spurs"   TEE WITHOUT CARDIOVERSION N/A 02/14/2023   Procedure: TRANSESOPHAGEAL ECHOCARDIOGRAM (TEE);  Surgeon: Meriam Sprague, MD;  Location[facility] ENDOSCOPY;  Service: Cardiovascular;  Laterality: N/A;   TOE FUSION Right    great toe; plates and screws   TONSILLECTOMY  ~ 1950   TOTAL HIP ARTHROPLASTY Right 09/22/2015   Procedure: TOTAL HIP ARTHROPLASTY;  Surgeon: Valeria Batman, MD;  Location: [facility] OR;  Service: Orthopedics;  Laterality: Right;   TOTAL KNEE ARTHROPLASTY Right 05/06/2014   TOTAL KNEE ARTHROPLASTY Right 05/06/2014   Procedure: RIGHT TOTAL KNEE ARTHROPLASTY;  Surgeon: Valeria Batman, MD;  Location: [facility] OR;  Service: Orthopedics;  Laterality: Right;   TOTAL KNEE ARTHROPLASTY Left 02/05/2019   Procedure: LEFT TOTAL KNEE ARTHROPLASTY;  Surgeon:  Valeria Batman, MD;  Location: [facility] OR;  Service: Orthopedics;  Laterality: Left;    Current Outpatient Medications  Medication Sig Dispense Refill   amiodarone (PACERONE) 200 MG tablet Take 2 tablets (400 mg total) TWICE a day for 2 weeks, then take 1 tablet (200 mg total) TWICE a day for 2 weeks, then take 1 tablet (200 mg total) ONCE a day (Patient taking differently: Take 400 mg by mouth daily. Take 2 tablets (400 mg total) TWICE a day for 2 weeks, then take 1 tablet (200 mg total) TWICE a day for 2 weeks, then take 1 tablet (200 mg total) ONCE a day) 90 tablet 1   cholecalciferol (VITAMIN D3) 25 MCG (1000 UNIT) tablet Take 1,000 Units by mouth daily.     diltiazem (CARDIZEM CD) 120 MG 24 hr capsule Take 1 capsule (120 mg total) by mouth daily. 90 capsule 3  FIBER PO Take 1 Scoop by mouth daily.     furosemide (LASIX) 40 MG tablet Take one tablet (40 mg) by mouth every day for three days only then take one tablet every other day thereafter. (Patient taking differently: Take 40 mg by mouth every other day.) 90 tablet 3   irbesartan (AVAPRO) 150 MG tablet Take 1 tablet (150 mg total) by mouth daily. 90 tablet 3   isosorbide mononitrate (IMDUR) 60 MG 24 hr tablet Take 1 tablet (60 mg total) by mouth daily. Can take an additional half tab as needed for chest pain 45 tablet 1   polyethylene glycol (MIRALAX / GLYCOLAX) 17 g packet Take 17 g by mouth daily.     potassium chloride (KLOR-CON) 10 MEQ tablet Take one tablet (10 meq) by mouth every day for three days only then take one tablet every other day thereafter. (Patient taking differently: Take 10 mEq by mouth every other day.) 90 tablet 3   rivaroxaban (XARELTO) 20 MG TABS tablet Take 1 tablet (20 mg total) by mouth daily with supper. 90 tablet 3   rosuvastatin (CRESTOR) 20 MG tablet TAKE 1 TABLET BY MOUTH EVERY DAY 90 tablet 3   No current facility-administered medications for this encounter.    Allergies  Allergen Reactions    Atorvastatin Calcium [Atorvastatin] Other (See Comments)    Myalgia   Simvastatin Other (See Comments)    Myalgia   Pravastatin Other (See Comments)    Leg Cramps    Social History   Socioeconomic History   Marital status: Married    Spouse name: Francena Hanly   Number of children: Not on file   Years of education: Not on file   Highest education level: Not on file  Occupational History   Occupation: Retired  Tobacco Use   Smoking status: Former    Packs/day: 2.00    Years: 10.00    Additional pack years: 0.00    Total pack years: 20.00    Types: Cigarettes    Quit date: 1981    Years since quitting: 43.4   Smokeless tobacco: Never   Tobacco comments:    quit smoking in 1981  Vaping Use   Vaping Use: Never used  Substance and Sexual Activity   Alcohol use: Not Currently    Comment: "drink a beer sometimes once/month"   Drug use: No   Sexual activity: Never  Other Topics Concern   Not on file  Social History Narrative   Not on file   Social Determinants of Health   Financial Resource Strain: Not on file  Food Insecurity: Not on file  Transportation Needs: Not on file  Physical Activity: Not on file  Stress: Not on file  Social Connections: Not on file  Intimate Partner Violence: Not on file     ROS- All systems are reviewed and negative except as per the HPI above.  Physical Exam: Vitals:   06/08/23 0925  BP: 128/66  Pulse: (!) 58  Weight: 105.3 kg  Height: 5\' 11"  (1.803 m)    GEN- The patient is well appearing, alert and oriented x 3 today.   Head- normocephalic, atraumatic Eyes-  Sclera clear, conjunctiva pink Ears- hearing intact Lungs- Clear to ausculation bilaterally, normal work of breathing Heart- Regular rate and rhythm, no murmurs, rubs or gallops, PMI not laterally displaced Extremities- no clubbing, cyanosis, or edema MS- no significant deformity or atrophy Skin- no rash or lesion Psych- euthymic mood, full affect Neuro- strength and  sensation are  intact   Wt Readings from Last 3 Encounters:  06/08/23 105.3 kg  06/07/23 102.5 kg  05/23/23 104.3 kg    EKG today demonstrates Vent. rate 58 BPM PR interval 218 ms QRS duration 84 ms QT/QTcB 422/414 ms P-R-T axes -11 -15 22 Sinus bradycardia with 1st degree A-V block with occasional Premature ventricular complexes Low voltage QRS Cannot rule out Anterior infarct , age undetermined Abnormal ECG When compared with ECG of 23-May-2023 09:40, PREVIOUS ECG IS PRESENT  Echo 2/20//24 (TEE) demonstrated: 1. Left ventricular ejection fraction, by estimation, is 60 to 65%. The  left ventricle has normal function.   2. Right ventricular systolic function is normal. The right ventricular  size is normal. There is normal pulmonary artery systolic pressure.   3. Left atrial size was mildly dilated. No left atrial/left atrial  appendage thrombus was detected. The LAA emptying velocity was 52 cm/s.   4. Right atrial size was mildly dilated.   5. The mitral valve is grossly normal. Mild to moderate mitral valve  regurgitation.   6. The aortic valve is tricuspid. There is mild calcification of the  aortic valve. There is mild thickening of the aortic valve. Aortic valve  regurgitation is mild. Aortic valve sclerosis/calcification is present,  without any evidence of aortic  stenosis.   7. Aortic dilatation noted. There is mild dilatation of the ascending  aorta, measuring 43 mm.   8. Following TEE, the patient underwent successful DCCV with 1 shock at  150J.   Cardiac monitor 3/5-19/24: 100% Afib burden PVC load 4.1%  Epic records are reviewed at length today.  CHA2DS2-VASc Score = 4  The patient's score is based upon: CHF History: 1 HTN History: 1 Diabetes History: 0 Stroke History: 0 Vascular Disease History: 0 Age Score: 2 Gender Score: 0       ASSESSMENT AND PLAN: Persistent Atrial Fibrillation (ICD10:  I48.19) The patient's CHA2DS2-VASc score is 4,  indicating a 4.8% annual risk of stroke.    He is currently in NSR s/p DCCV on 05/23/23.   Continue amiodarone 200 mg daily. Scheduled ablation in September.  2. Secondary Hypercoagulable State (ICD10:  D68.69) The patient is at significant risk for stroke/thromboembolism based upon his CHA2DS2-VASc Score of 4.  Continue Rivaroxaban (Xarelto).   Importance of anticoagulation and education provided to remain compliant.  3. Obesity Body mass index is 32.39 kg/m. Lifestyle modification was discussed at length including regular exercise and weight reduction. Encouraged daily walking  4. Snoring with concern for Obstructive sleep apnea Wife is adamant that he has diagnosis but patient currently refuses sleep study at this time. I will revisit with him in the future.  5. HTN Looks stable today, no changes.   F/u as scheduled for imaging prior to ablation.    Lake Bells, PA-C Afib Clinic [facility] [address] [place], [place] [address] [phone] 06/08/2023 9:41 AM

## 2023-06-23 ENCOUNTER — Other Ambulatory Visit: Payer: Self-pay | Admitting: Cardiology

## 2023-07-13 ENCOUNTER — Other Ambulatory Visit: Payer: Self-pay

## 2023-07-13 DIAGNOSIS — I4819 Other persistent atrial fibrillation: Secondary | ICD-10-CM

## 2023-08-02 ENCOUNTER — Encounter: Payer: Self-pay | Admitting: Bariatrics

## 2023-08-02 ENCOUNTER — Ambulatory Visit (INDEPENDENT_AMBULATORY_CARE_PROVIDER_SITE_OTHER): Payer: Medicare Other | Admitting: Bariatrics

## 2023-08-02 VITALS — BP 132/82 | HR 48 | Temp 97.7°F | Ht 71.0 in | Wt 234.0 lb

## 2023-08-02 DIAGNOSIS — Z6832 Body mass index (BMI) 32.0-32.9, adult: Secondary | ICD-10-CM

## 2023-08-02 DIAGNOSIS — I5032 Chronic diastolic (congestive) heart failure: Secondary | ICD-10-CM | POA: Diagnosis not present

## 2023-08-02 DIAGNOSIS — E669 Obesity, unspecified: Secondary | ICD-10-CM

## 2023-08-03 ENCOUNTER — Other Ambulatory Visit: Payer: Self-pay | Admitting: Nurse Practitioner

## 2023-08-05 ENCOUNTER — Other Ambulatory Visit: Payer: Self-pay | Admitting: Cardiology

## 2023-08-05 DIAGNOSIS — E7800 Pure hypercholesterolemia, unspecified: Secondary | ICD-10-CM

## 2023-08-07 NOTE — Progress Notes (Unsigned)
Chief Complaint:   OBESITY Christian [name] is here to discuss his progress with his obesity treatment plan along with follow-up of his obesity related diagnoses. Christian [name] is on the Category 1 Plan and states he is following his eating plan approximately 50% of the time. Christian [name] states he is walking for 35 minutes 6 times per week.  Today's visit was #: 37 Starting weight: 278 lbs Starting date: 06/24/2020 Today's weight: 234 lbs Today's date: 08/02/2023 Total lbs lost to date: 44 Total lbs lost since last in-office visit: 0  Interim History: Patient is up 8 lbs since his last visit. There has been a death in the family, which has been stressful. He states that he is eating the wrong stuff.   Subjective:   1. Chronic diastolic heart failure (HCC) Patient is taking Avapro, Imdur, Lasix, Cardizem, and amiodarone. He has a history of A-fib, and his Ca+ was low but he is drinking water.  Assessment/Plan:   1. Chronic diastolic heart failure (HCC) Patient is to follow-up with Cardiology, keep any water restriction, continue with his exercise, and will go for testing next month. He is to have a cardiac ablation on 09/05/2023.  2. Generalized obesity  3. BMI 32.0-32.9,adult Christian [name] is currently in the action stage of change. As such, his goal is to continue with weight loss efforts. He has agreed to the Category 1 Plan.   Exercise goals: As is.   Patient will adhere to the plan 80-90%. Meal planning was discussed.   Behavioral modification strategies: increasing lean protein intake, decreasing simple carbohydrates, increasing vegetables, increasing water intake, decreasing eating out, no skipping meals, meal planning and cooking strategies, and keeping healthy foods in the home.  Christian [name] has agreed to follow-up with our clinic in 6 to 8 weeks. He was informed of the importance of frequent follow-up visits to maximize his success with intensive lifestyle modifications for his multiple health  conditions.   Objective:   Blood pressure 132/82, pulse (!) 48, temperature 97.7 F (36.5 C), height 5\' 11"  (1.803 m), weight 234 lb (106.1 kg), SpO2 95%. Body mass index is 32.64 kg/m.  General: Cooperative, alert, well developed, in no acute distress. HEENT: Conjunctivae and lids unremarkable. Cardiovascular: Regular rhythm.  Lungs: Normal work of breathing. Neurologic: No focal deficits.   Lab Results  Component Value Date   CREATININE 1.20 05/23/2023   BUN 17 05/23/2023   NA 140 05/23/2023   K 4.3 05/23/2023   CL 101 05/23/2023   CO2 23 02/10/2023   Lab Results  Component Value Date   ALT 15 01/26/2023   AST 23 01/26/2023   ALKPHOS 73 01/26/2023   BILITOT 0.5 01/26/2023   Lab Results  Component Value Date   HGBA1C 5.1 07/18/2022   HGBA1C 5.3 09/28/2021   HGBA1C 5.4 06/24/2020   Lab Results  Component Value Date   INSULIN 5.4 07/18/2022   INSULIN 5.1 09/28/2021   INSULIN 4.4 01/26/2021   INSULIN 6.8 10/05/2020   INSULIN 12.1 06/24/2020   Lab Results  Component Value Date   TSH 4.490 01/26/2023   Lab Results  Component Value Date   CHOL 122 07/18/2022   HDL 62 07/18/2022   LDLCALC 48 07/18/2022   TRIG 53 07/18/2022   CHOLHDL 2.4 06/15/2020   Lab Results  Component Value Date   VD25OH 59.4 07/18/2022   VD25OH 58.9 09/28/2021   VD25OH 36.6 01/26/2021   Lab Results  Component Value Date   WBC 6.4 01/26/2023   HGB  15.3 05/23/2023   HCT 45.0 05/23/2023   MCV 100 (H) 01/26/2023   PLT 206 01/26/2023   No results found for: "IRON", "TIBC", "FERRITIN"  Attestation Statements:   Reviewed by clinician on day of visit: allergies, medications, problem list, medical history, surgical history, family history, social history, and previous encounter notes.   Trude Mcburney, am acting as Energy manager for [employer], DO.  I have reviewed the above documentation for accuracy and completeness, and I agree with the above. Corinna Capra, DO

## 2023-08-14 DIAGNOSIS — H35032 Hypertensive retinopathy, left eye: Secondary | ICD-10-CM | POA: Diagnosis not present

## 2023-08-14 DIAGNOSIS — H26491 Other secondary cataract, right eye: Secondary | ICD-10-CM | POA: Diagnosis not present

## 2023-08-14 DIAGNOSIS — H04123 Dry eye syndrome of bilateral lacrimal glands: Secondary | ICD-10-CM | POA: Diagnosis not present

## 2023-08-14 DIAGNOSIS — H40013 Open angle with borderline findings, low risk, bilateral: Secondary | ICD-10-CM | POA: Diagnosis not present

## 2023-08-22 ENCOUNTER — Ambulatory Visit: Payer: Medicare Other | Attending: Cardiology

## 2023-08-22 DIAGNOSIS — I4819 Other persistent atrial fibrillation: Secondary | ICD-10-CM

## 2023-08-23 LAB — BASIC METABOLIC PANEL
BUN/Creatinine Ratio: 19 (ref 10–24)
BUN: 22 mg/dL (ref 8–27)
CO2: 26 mmol/L (ref 20–29)
Calcium: 9.2 mg/dL (ref 8.6–10.2)
Chloride: 104 mmol/L (ref 96–106)
Creatinine, Ser: 1.16 mg/dL (ref 0.76–1.27)
Glucose: 93 mg/dL (ref 70–99)
Potassium: 4.3 mmol/L (ref 3.5–5.2)
Sodium: 139 mmol/L (ref 134–144)
eGFR: 65 mL/min/{1.73_m2} (ref >59)

## 2023-08-23 LAB — CBC
Hematocrit: 41.5 % (ref 37.5–51.0)
Hemoglobin: 13.8 g/dL (ref 13.0–17.7)
MCH: 32.9 pg (ref 26.6–33.0)
MCHC: 33.3 g/dL (ref 31.5–35.7)
MCV: 99 fL — ABNORMAL HIGH (ref 79–97)
Platelets: 174 10*3/uL (ref 150–450)
RBC: 4.19 x10E6/uL (ref 4.14–5.80)
RDW: 13 % (ref 11.6–15.4)
WBC: 8 10*3/uL (ref 3.4–10.8)

## 2023-08-29 ENCOUNTER — Ambulatory Visit (HOSPITAL_COMMUNITY)
Admission: RE | Admit: 2023-08-29 | Discharge: 2023-08-29 | Disposition: A | Discharge status: Home / Self Care | Payer: Medicare Other | Source: Ambulatory Visit | Attending: Cardiology | Admitting: Cardiology

## 2023-08-29 DIAGNOSIS — I4819 Other persistent atrial fibrillation: Secondary | ICD-10-CM | POA: Diagnosis not present

## 2023-08-29 MED ORDER — IOHEXOL 350 MG/ML SOLN
95.0000 mL | Freq: Once | INTRAVENOUS | Status: AC | PRN
  Administered 2023-08-29: 95 mL via INTRAVENOUS

## 2023-08-31 ENCOUNTER — Telehealth: Payer: Self-pay

## 2023-08-31 DIAGNOSIS — I2510 Atherosclerotic heart disease of native coronary artery without angina pectoris: Secondary | ICD-10-CM

## 2023-08-31 DIAGNOSIS — R079 Chest pain, unspecified: Secondary | ICD-10-CM

## 2023-08-31 NOTE — Telephone Encounter (Signed)
-----   Message from [name] sent at 08/31/2023  8:41 AM EDT ----- Patient has an elevated coronary calcium score that is pretty high on recent pulmonary vein study.    Alcario Drought can you please get him in with PA to find out if he is symptomatic.  If he is not symptomatic then would recommend a stress PET CT to rule out silent ischemia which the PA can schedule at that time. ----- Message ----- From: Regan Lemming, MD Sent: 08/30/2023   5:59 PM EDT To: Quintella Reichert, MD; Baird Lyons, RN  CT without LAA thrombus. Aortic aneurysm and coronary calcium per primary cardiology

## 2023-08-31 NOTE — Telephone Encounter (Signed)
Call to patient to advise that he has an elevated coronary calcium score as well as aortic aneurysm. Scheduled appt w/ PA 09/08/23 for evaluation of symptoms. Patient has ablation scheduled 09/05/23 and is asking he can proceed with this, patient responses forwarded to Dr. Mayford Knife and Dr. [name].

## 2023-09-01 NOTE — Addendum Note (Signed)
Addended by: Luellen Pucker on: 09/01/2023 01:19 PM   Modules accepted: Orders

## 2023-09-01 NOTE — Telephone Encounter (Signed)
Called patient to discuss proceeding with ablation. Patient states that his chest pain has been well controlled since he started on Imur 08/03/23. He denies any chest pain. Patient responses forwarded to Dr. Mayford Knife, Dr. [name] and Dr. Gershon Crane nurse.

## 2023-09-01 NOTE — Telephone Encounter (Signed)
Pt informed ok to proceed with ablation on Monday per Dr. [name].

## 2023-09-01 NOTE — Telephone Encounter (Signed)
Call to discuss with patient that Dr. Mayford Knife recommends stress/PET/CT, patient agrees to plan, orders placed. Instructions sent over Mychart. Attestation order placed.

## 2023-09-04 NOTE — Anesthesia Preprocedure Evaluation (Signed)
Anesthesia Evaluation  Patient identified by MRN, date of birth, ID band Patient awake    Reviewed: Allergy & Precautions, NPO status , Patient's Chart, lab work & pertinent test results  History of Anesthesia Complications Negative for: history of anesthetic complications  Airway Mallampati: II  TM Distance: >3 FB Neck ROM: Full    Dental  (+) Dental Advisory Given   Pulmonary former smoker   breath sounds clear to auscultation       Cardiovascular hypertension, Pt. on medications (-) angina + CAD (medical management, no chest pain)  + dysrhythmias Atrial Fibrillation  Rhythm:Irregular Rate:Normal  01/2023 ECHO: EF 60-65%, normal LVF, normal RVF, mil-mod MR, mild AI without AS '21 Stress: low risk   Neuro/Psych negative neurological ROS     GI/Hepatic negative GI ROS, Neg liver ROS,neg GERD  ,,  Endo/Other  BMI 32  Renal/GU negative Renal ROS     Musculoskeletal  (+) Arthritis ,    Abdominal   Peds  Hematology xarelto   Anesthesia Other Findings   Reproductive/Obstetrics                              Anesthesia Physical Anesthesia Plan  ASA: 3  Anesthesia Plan: General   Post-op Pain Management: Tylenol PO (pre-op)* and Minimal or no pain anticipated   Induction: Intravenous  PONV Risk Score and Plan: 2 and Ondansetron and Dexamethasone  Airway Management Planned: Oral ETT  Additional Equipment: None  Intra-op Plan:   Post-operative Plan: Extubation in OR  Informed Consent: I have reviewed the patients History and Physical, chart, labs and discussed the procedure including the risks, benefits and alternatives for the proposed anesthesia with the patient or authorized representative who has indicated his/her understanding and acceptance.     Dental advisory given  Plan Discussed with: CRNA and Surgeon  Anesthesia Plan Comments:          Anesthesia Quick  Evaluation

## 2023-09-04 NOTE — Pre-Procedure Instructions (Signed)
Attempted to call patient regarding procedure instructions.  Left voicemail on the following items: Arrival time 0515 Nothing to eat or drink after midnight No meds AM of procedure Responsible person to drive you home and stay with you for 24 hrs  Have you missed any doses of anti-coagulant Xarelto- should be taken once a day.  If you have missed any doses please let us know. Don't take dose in the morning.

## 2023-09-05 ENCOUNTER — Ambulatory Visit (HOSPITAL_COMMUNITY)
Admission: RE | Admit: 2023-09-05 | Discharge: 2023-09-05 | Disposition: A | Discharge status: Home / Self Care | Payer: Medicare Other | Attending: Cardiology | Admitting: Cardiology

## 2023-09-05 ENCOUNTER — Ambulatory Visit (HOSPITAL_COMMUNITY): Payer: Self-pay | Admitting: Anesthesiology

## 2023-09-05 ENCOUNTER — Encounter (HOSPITAL_COMMUNITY)
Admission: RE | Disposition: A | Discharge status: Home / Self Care | Payer: Self-pay | Source: Home / Self Care | Attending: Cardiology

## 2023-09-05 ENCOUNTER — Other Ambulatory Visit: Payer: Self-pay

## 2023-09-05 ENCOUNTER — Ambulatory Visit (HOSPITAL_COMMUNITY): Payer: Medicare Other | Admitting: Anesthesiology

## 2023-09-05 DIAGNOSIS — I2510 Atherosclerotic heart disease of native coronary artery without angina pectoris: Secondary | ICD-10-CM | POA: Insufficient documentation

## 2023-09-05 DIAGNOSIS — Z87891 Personal history of nicotine dependence: Secondary | ICD-10-CM | POA: Insufficient documentation

## 2023-09-05 DIAGNOSIS — E7800 Pure hypercholesterolemia, unspecified: Secondary | ICD-10-CM | POA: Diagnosis not present

## 2023-09-05 DIAGNOSIS — I4819 Other persistent atrial fibrillation: Secondary | ICD-10-CM | POA: Diagnosis not present

## 2023-09-05 DIAGNOSIS — I10 Essential (primary) hypertension: Secondary | ICD-10-CM | POA: Diagnosis not present

## 2023-09-05 DIAGNOSIS — Z8249 Family history of ischemic heart disease and other diseases of the circulatory system: Secondary | ICD-10-CM | POA: Diagnosis not present

## 2023-09-05 HISTORY — PX: ATRIAL FIBRILLATION ABLATION: EP1191

## 2023-09-05 LAB — POCT ACTIVATED CLOTTING TIME: Activated Clotting Time: 293 s

## 2023-09-05 SURGERY — ATRIAL FIBRILLATION ABLATION
Anesthesia: General

## 2023-09-05 MED ORDER — SODIUM CHLORIDE 0.9 % IV SOLN: INTRAVENOUS | Status: DC

## 2023-09-05 MED ORDER — SODIUM CHLORIDE 0.9% FLUSH: 3.0000 mL | INTRAVENOUS | Status: DC | PRN

## 2023-09-05 MED ORDER — SODIUM CHLORIDE 0.9 % IV SOLN: 250.0000 mL | INTRAVENOUS | Status: DC | PRN

## 2023-09-05 MED ORDER — ACETAMINOPHEN 325 MG PO TABS: 650.0000 mg | ORAL_TABLET | ORAL | Status: DC | PRN

## 2023-09-05 MED ORDER — PHENYLEPHRINE HCL-NACL 20-0.9 MG/250ML-% IV SOLN
INTRAVENOUS | Status: DC | PRN
  Administered 2023-09-05: 25 ug/min via INTRAVENOUS

## 2023-09-05 MED ORDER — ONDANSETRON HCL 4 MG/2ML IJ SOLN: 4.0000 mg | Freq: Four times a day (QID) | INTRAMUSCULAR | Status: DC | PRN

## 2023-09-05 MED ORDER — SODIUM CHLORIDE 0.9% FLUSH: 3.0000 mL | Freq: Two times a day (BID) | INTRAVENOUS | Status: DC

## 2023-09-05 MED ORDER — PROPOFOL 10 MG/ML IV BOLUS
INTRAVENOUS | Status: DC | PRN
  Administered 2023-09-05: 160 mg via INTRAVENOUS

## 2023-09-05 MED ORDER — MIDAZOLAM HCL 2 MG/2ML IJ SOLN
INTRAMUSCULAR | Status: AC
  Filled 2023-09-05: qty 2

## 2023-09-05 MED ORDER — HEPARIN (PORCINE) IN NACL 1000-0.9 UT/500ML-% IV SOLN
INTRAVENOUS | Status: DC | PRN
  Administered 2023-09-05 (×4): 500 mL

## 2023-09-05 MED ORDER — ONDANSETRON HCL 4 MG/2ML IJ SOLN
INTRAMUSCULAR | Status: DC | PRN
  Administered 2023-09-05: 4 mg via INTRAVENOUS

## 2023-09-05 MED ORDER — LIDOCAINE 2% (20 MG/ML) 5 ML SYRINGE
INTRAMUSCULAR | Status: DC | PRN
  Administered 2023-09-05: 100 mg via INTRAVENOUS

## 2023-09-05 MED ORDER — ACETAMINOPHEN 500 MG PO TABS
1000.0000 mg | ORAL_TABLET | Freq: Once | ORAL | Status: AC
  Administered 2023-09-05: 1000 mg via ORAL
  Filled 2023-09-05: qty 2

## 2023-09-05 MED ORDER — PROTAMINE SULFATE 10 MG/ML IV SOLN
INTRAVENOUS | Status: DC | PRN
Start: 2023-09-05 — End: 2023-09-05
  Administered 2023-09-05: 40 mg via INTRAVENOUS

## 2023-09-05 MED ORDER — SUGAMMADEX SODIUM 200 MG/2ML IV SOLN
INTRAVENOUS | Status: DC | PRN
  Administered 2023-09-05: 200 mg via INTRAVENOUS

## 2023-09-05 MED ORDER — FENTANYL CITRATE (PF) 250 MCG/5ML IJ SOLN
INTRAMUSCULAR | Status: AC
  Filled 2023-09-05: qty 5

## 2023-09-05 MED ORDER — ATROPINE SULFATE 1 MG/ML IV SOLN
INTRAVENOUS | Status: DC | PRN
Start: 2023-09-05 — End: 2023-09-05
  Administered 2023-09-05: 1 mg via INTRAVENOUS

## 2023-09-05 MED ORDER — ROCURONIUM BROMIDE 10 MG/ML (PF) SYRINGE
PREFILLED_SYRINGE | INTRAVENOUS | Status: DC | PRN
  Administered 2023-09-05: 60 mg via INTRAVENOUS

## 2023-09-05 MED ORDER — HEPARIN SODIUM (PORCINE) 1000 UNIT/ML IJ SOLN
INTRAMUSCULAR | Status: DC | PRN
Start: 2023-09-05 — End: 2023-09-05
  Administered 2023-09-05: 14000 [IU] via INTRAVENOUS
  Administered 2023-09-05: 3000 [IU] via INTRAVENOUS

## 2023-09-05 MED ORDER — MIDAZOLAM HCL 2 MG/2ML IJ SOLN
INTRAMUSCULAR | Status: DC | PRN
  Administered 2023-09-05: 2 mg via INTRAVENOUS

## 2023-09-05 MED ORDER — DEXAMETHASONE SODIUM PHOSPHATE 10 MG/ML IJ SOLN
INTRAMUSCULAR | Status: DC | PRN
  Administered 2023-09-05: 10 mg via INTRAVENOUS

## 2023-09-05 MED ORDER — FENTANYL CITRATE (PF) 250 MCG/5ML IJ SOLN
INTRAMUSCULAR | Status: DC | PRN
  Administered 2023-09-05 (×2): 50 ug via INTRAVENOUS

## 2023-09-05 SURGICAL SUPPLY — 22 items
BAG SNAP BAND KOVER 36X36 (MISCELLANEOUS) IMPLANT
BLANKET WARM UNDERBOD FULL ACC (MISCELLANEOUS) ×2 IMPLANT
CABLE PFA RX CATH CONN (CABLE) IMPLANT
CATH FARAWAVE ABLATION 31 (CATHETERS) IMPLANT
CATH OCTARAY 2.0 F 3-3-3-3-3 (CATHETERS) IMPLANT
CATH SOUNDSTAR ECO 8FR (CATHETERS) IMPLANT
CATH WEBSTER BI DIR CS D-F CRV (CATHETERS) IMPLANT
CLOSURE MYNX CONTROL 6F/7F (Vascular Products) IMPLANT
CLOSURE PERCLOSE PROSTYLE (VASCULAR PRODUCTS) IMPLANT
COVER SWIFTLINK CONNECTOR (BAG) ×2 IMPLANT
DILATOR VESSEL 38 20CM 16FR (INTRODUCER) IMPLANT
GUIDEWIRE INQWIRE 1.5J.035X260 (WIRE) IMPLANT
INQWIRE 1.5J .035X260CM (WIRE) ×1
PACK EP LATEX FREE (CUSTOM PROCEDURE TRAY) ×1
PACK EP LF (CUSTOM PROCEDURE TRAY) ×2 IMPLANT
PAD DEFIB RADIO PHYSIO CONN (PAD) ×2 IMPLANT
SHEATH FARADRIVE STEERABLE (SHEATH) IMPLANT
SHEATH PINNACLE 7F 10CM (SHEATH) IMPLANT
SHEATH PINNACLE 8F 10CM (SHEATH) IMPLANT
SHEATH PINNACLE 9F 10CM (SHEATH) IMPLANT
SHEATH PROBE COVER 6X72 (BAG) IMPLANT
SHEATH WIRE KIT BAYLIS SL1 (KITS) IMPLANT

## 2023-09-05 NOTE — Discharge Instructions (Signed)

## 2023-09-05 NOTE — Addendum Note (Signed)
Addendum  created 09/05/23 1242 by Jairo Ben, MD   Attestation recorded in Intraprocedure, Intraprocedure Attestations filed

## 2023-09-05 NOTE — H&P (Signed)
Electrophysiology Office Note   Date:  09/05/2023   ID:  Christian [name], DOB [birth year], MRN [id number]  PCP:  Sheliah Hatch, PA-C  Cardiologist:  Mayford Knife Primary Electrophysiologist:  Danelly Hassinger Jorja Loa, MD    Chief Complaint: AF   History of Present Illness: Christian [name] is a 77 y.o. male who is being seen today for the evaluation of AF at the request of No ref. provider found. Presenting today for electrophysiology evaluation.  He has a history significant for coronary artery disease, hypertension, hyperlipidemia, PVCs, atrial fibrillation.  He was seen by cardiology 01/26/2023 was found to be in rapid atrial fibrillation.  He underwent urgent but unfortunately went back into atrial fibrillation.  He wore a cardiac monitor with a 100% atrial fibrillation burden.  He feels weak, fatigued, short of breath with palpitations and mild chest discomfort.  He would prefer a rhythm control strategy.  Today, denies symptoms of palpitations, chest pain, shortness of breath, orthopnea, PND, lower extremity edema, claudication, dizziness, presyncope, syncope, bleeding, or neurologic sequela. The patient is tolerating medications without difficulties. Plan ablation today.    Past Medical History:  Diagnosis Date   Arthritis    "joints; shoulders; left elbow; right thumb" (05/06/2014)   Atrial tachycardia    Breathing problem    Cataracts, bilateral    immature   Chest pain    Constipation    takes Colace and Metamucil daily   Coronary artery disease 02/2011   cath 11/2016 showing 20% RCA, 50% OM, 35% mid LCx, 70% D1, 65% mid LAD, 70% distal LAD 80% on medical management   Diastolic dysfunction    GERD (gastroesophageal reflux disease)    Heart murmur    Hemorrhoids    History of bronchitis    Hypercholesterolemia    takes Crestor daily   Hypertension    Joint pain    Joint pain    Nocturia    Orthostatic hypotension    Palpitations    Premature atrial contractions     PVC's (premature ventricular contractions)    Restless leg syndrome    Rheumatoid arthritis (HCC)    Sciatica    Sinus bradycardia    Past Surgical History:  Procedure Laterality Date   ACHILLES TENDON REPAIR Left    "& fixed a spur"   CARDIAC CATHETERIZATION  2012   CARDIAC CATHETERIZATION N/A 12/06/2016   Procedure: Left Heart Cath and Coronary Angiography;  Surgeon: Lyn Records, MD;  Location: [facility] INVASIVE CV LAB;  Service: Cardiovascular;  Laterality: N/A;   CARDIOVERSION N/A 02/14/2023   Procedure: CARDIOVERSION;  Surgeon: Meriam Sprague, MD;  Location: [facility] ENDOSCOPY;  Service: Cardiovascular;  Laterality: N/A;   CARDIOVERSION N/A 05/23/2023   Procedure: CARDIOVERSION;  Surgeon: Parke Poisson, MD;  Location: [facility] INVASIVE CV LAB;  Service: Cardiovascular;  Laterality: N/A;   COLONOSCOPY     FLEXIBLE SIGMOIDOSCOPY  X 2   HAND SURGERY Left    "thumb; cleaned out arthritis"   HEEL SPUR EXCISION Left    KNEE ARTHROSCOPY Right    LUMBAR LAMINECTOMY/DECOMPRESSION MICRODISCECTOMY N/A 03/30/2021   Procedure: LEFT L3 HEMILAMINECTOMY WITH EXCISION OF DISC HERNIATION;  Surgeon: Kerrin Champagne, MD;  Location: [facility] OR;  Service: Orthopedics;  Laterality: N/A;   SHOULDER SURGERY Right X 2   "cleaned out spurs"   TEE WITHOUT CARDIOVERSION N/A 02/14/2023   Procedure: TRANSESOPHAGEAL ECHOCARDIOGRAM (TEE);  Surgeon: Meriam Sprague, MD;  Location: [facility] ENDOSCOPY;  Service: Cardiovascular;  Laterality: N/A;  TOE FUSION Right    great toe; plates and screws   TONSILLECTOMY  ~ 1950   TOTAL HIP ARTHROPLASTY Right 09/22/2015   Procedure: TOTAL HIP ARTHROPLASTY;  Surgeon: Valeria Batman, MD;  Location: [facility] OR;  Service: Orthopedics;  Laterality: Right;   TOTAL KNEE ARTHROPLASTY Right 05/06/2014   TOTAL KNEE ARTHROPLASTY Right 05/06/2014   Procedure: RIGHT TOTAL KNEE ARTHROPLASTY;  Surgeon: Valeria Batman, MD;  Location: [facility] OR;  Service: Orthopedics;  Laterality: Right;   TOTAL KNEE  ARTHROPLASTY Left 02/05/2019   Procedure: LEFT TOTAL KNEE ARTHROPLASTY;  Surgeon: Valeria Batman, MD;  Location: [facility] OR;  Service: Orthopedics;  Laterality: Left;     Current Facility-Administered Medications  Medication Dose Route Frequency Provider Last Rate Last Admin   0.9 %  sodium chloride infusion   Intravenous Continuous Regan Lemming, MD 50 mL/hr at 09/05/23 0553 New Bag at 09/05/23 0553    Allergies:   Atorvastatin calcium [atorvastatin], Simvastatin, and Pravastatin   Social History:  The patient  reports that he quit smoking about 43 years ago. His smoking use included cigarettes. He started smoking about 53 years ago. He has a 20 pack-year smoking history. He has never used smokeless tobacco. He reports that he does not currently use alcohol. He reports that he does not use drugs.   Family History:  The patient's family history includes CVA in his father and mother; Heart disease in his father; High Cholesterol in his father; High blood pressure in his father; Stroke in his father and mother.   ROS:  Please see the history of present illness.   Otherwise, review of systems is positive for none.   All other systems are reviewed and negative.   PHYSICAL EXAM: VS:  BP (!) 147/96   Pulse (!) 56   Temp 97.6 F (36.4 C) (Temporal)   Resp 15   Ht 5\' 11"  (1.803 m)   Wt 104.3 kg   SpO2 97%   BMI 32.08 kg/m  , BMI Body mass index is 32.08 kg/m. GEN: Well nourished, well developed, in no acute distress  HEENT: normal  Neck: no JVD, carotid bruits, or masses Cardiac: RRR; no murmurs, rubs, or gallops,no edema  Respiratory:  clear to auscultation bilaterally, normal work of breathing GI: soft, nontender, nondistended, + BS MS: no deformity or atrophy  Skin: warm and dry Neuro:  Strength and sensation are intact Psych: euthymic mood, full affect  Recent Labs: 01/26/2023: ALT 15; NT-Pro BNP 1,694; TSH 4.490 08/22/2023: BUN 22; Creatinine, Ser 1.16; Hemoglobin 13.8;  Platelets 174; Potassium 4.3; Sodium 139    Lipid Panel     Component Value Date/Time   CHOL 122 07/18/2022 1012   TRIG 53 07/18/2022 1012   HDL 62 07/18/2022 1012   CHOLHDL 2.4 06/15/2020 1048   CHOLHDL 2.9 04/27/2016 0826   VLDL 14 04/27/2016 0826   LDLCALC 48 07/18/2022 1012     Wt Readings from Last 3 Encounters:  09/05/23 104.3 kg  08/02/23 106.1 kg  06/08/23 105.3 kg      Other studies Reviewed: Additional studies/ records that were reviewed today include: TEE 02/14/23  Review of the above records today demonstrates:   1. Left ventricular ejection fraction, by estimation, is 60 to 65%. The  left ventricle has normal function.   2. Right ventricular systolic function is normal. The right ventricular  size is normal. There is normal pulmonary artery systolic pressure.   3. Left atrial size was mildly dilated. No  left atrial/left atrial  appendage thrombus was detected. The LAA emptying velocity was 52 cm/s.   4. Right atrial size was mildly dilated.   5. The mitral valve is grossly normal. Mild to moderate mitral valve  regurgitation.   6. The aortic valve is tricuspid. There is mild calcification of the  aortic valve. There is mild thickening of the aortic valve. Aortic valve  regurgitation is mild. Aortic valve sclerosis/calcification is present,  without any evidence of aortic  stenosis.   7. Aortic dilatation noted. There is mild dilatation of the ascending  aorta, measuring 43 mm.   Cardiac monitor 03/21/2023 personally reviewed   Predominant rhythm was atrial fibrillation with 100% afib burden with average heart rate 97bpm and ranged from 51 to 169bpm.   Multiple episodes of wide complex rhythm with fastest interval 160bpm and longest interval 9 beats. May represent nonsustained VT vs. aberration in setting of afib.   Occasional PVCs, ventricular couplets and triplets with PVC load 4.1%  ASSESSMENT AND PLAN:  1.  Persistent atrial fibrillation: Christian [name] has presented today for surgery, with the diagnosis of AF.  The various methods of treatment have been discussed with the patient and family. After consideration of risks, benefits and other options for treatment, the patient has consented to  Procedure(s): Catheter ablation as a surgical intervention .  Risks include but not limited to complete heart block, stroke, esophageal damage, nerve damage, bleeding, vascular damage, tamponade, perforation, MI, and death. The patient's history has been reviewed, patient examined, no change in status, stable for surgery.  I have reviewed the patient's chart and labs.  Questions were answered to the patient's satisfaction.    Enes Rokosz [name], MD 09/05/2023 7:06 AM

## 2023-09-05 NOTE — Anesthesia Postprocedure Evaluation (Signed)
Anesthesia Post Note  Patient: Christian [name]  Procedure(s) Performed: ATRIAL FIBRILLATION ABLATION     Patient location during evaluation: PACU Anesthesia Type: General Level of consciousness: awake and alert, patient cooperative and oriented Pain management: pain level controlled Vital Signs Assessment: post-procedure vital signs reviewed and stable Respiratory status: spontaneous breathing, nonlabored ventilation and respiratory function stable Cardiovascular status: blood pressure returned to baseline and stable Postop Assessment: no apparent nausea or vomiting and adequate PO intake Anesthetic complications: no   There were no known notable events for this encounter.  Last Vitals:  Vitals:   09/05/23 1130 09/05/23 1200  BP: (!) 139/91 128/80  Pulse: (!) 50 (!) 52  Resp: 16 16  Temp:    SpO2: 96% 93%    Last Pain:  Vitals:   09/05/23 1026  TempSrc:   PainSc: 0-No pain                 Breslyn Abdo,E. Tiamarie Furnari

## 2023-09-05 NOTE — Anesthesia Procedure Notes (Signed)
Procedure Name: Intubation Date/Time: 09/05/2023 7:44 AM  Performed by: Jairo Ben, MDPre-anesthesia Checklist: Patient identified, Emergency Drugs available, Suction available and Patient being monitored Patient Re-evaluated:Patient Re-evaluated prior to induction Oxygen Delivery Method: Circle system utilized Preoxygenation: Pre-oxygenation with 100% oxygen Induction Type: IV induction Ventilation: Mask ventilation without difficulty and Oral airway inserted - appropriate to patient size Laryngoscope Size: Mac and 4 Grade View: Grade I Tube type: Oral Tube size: 7.5 mm Number of attempts: 1 Airway Equipment and Method: Stylet and Oral airway Placement Confirmation: ETT inserted through vocal cords under direct vision, positive ETCO2 and breath sounds checked- equal and bilateral Tube secured with: Tape Dental Injury: Teeth and Oropharynx as per pre-operative assessment

## 2023-09-05 NOTE — Transfer of Care (Signed)
Immediate Anesthesia Transfer of Care Note  Patient: Christian [name]  Procedure(s) Performed: ATRIAL FIBRILLATION ABLATION  Patient Location: Cath Lab  Anesthesia Type:General  Level of Consciousness: awake, alert , oriented, and patient cooperative  Airway & Oxygen Therapy: Patient Spontanous Breathing and Patient connected to nasal cannula oxygen  Post-op Assessment: Report given to RN, Post -op Vital signs reviewed and stable, and Patient moving all extremities X 4  Post vital signs: Reviewed and stable  Last Vitals:  Vitals Value Taken Time  BP    Temp    Pulse    Resp    SpO2      Last Pain:  Vitals:   09/05/23 0558  TempSrc: Temporal         Complications: There were no known notable events for this encounter.

## 2023-09-06 ENCOUNTER — Encounter (HOSPITAL_COMMUNITY): Payer: Self-pay | Admitting: Cardiology

## 2023-09-06 ENCOUNTER — Other Ambulatory Visit: Payer: Self-pay | Admitting: Cardiology

## 2023-09-06 DIAGNOSIS — I10 Essential (primary) hypertension: Secondary | ICD-10-CM

## 2023-09-07 ENCOUNTER — Encounter: Payer: Self-pay | Admitting: Cardiology

## 2023-09-08 ENCOUNTER — Ambulatory Visit: Payer: Medicare Other | Admitting: Physician Assistant

## 2023-09-22 ENCOUNTER — Telehealth (HOSPITAL_COMMUNITY): Payer: Self-pay | Admitting: *Deleted

## 2023-09-22 NOTE — Telephone Encounter (Signed)
Reaching out to patient to offer assistance regarding upcoming cardiac imaging study; pt verbalizes understanding of appt date/time, parking situation and where to check in, pre-test NPO status and medications ordered, and verified current allergies; name and call back number provided for further questions should they arise Johney Frame RN Navigator Cardiac Imaging [facility] [phone] office [phone] cell  Patient aware to hold imdur and  avoid caffeine 12 hours prior.

## 2023-09-26 ENCOUNTER — Telehealth: Payer: Self-pay

## 2023-09-26 ENCOUNTER — Encounter (HOSPITAL_COMMUNITY)
Admission: RE | Admit: 2023-09-26 | Discharge: 2023-09-26 | Disposition: A | Discharge status: Home / Self Care | Payer: Medicare Other | Source: Ambulatory Visit | Attending: Cardiology | Admitting: Cardiology

## 2023-09-26 DIAGNOSIS — R079 Chest pain, unspecified: Secondary | ICD-10-CM | POA: Insufficient documentation

## 2023-09-26 LAB — NM PET CT CARDIAC PERFUSION MULTI W/ABSOLUTE BLOODFLOW
LV dias vol: 147 mL (ref 62–150)
LV sys vol: 54 mL
MBFR: 1.81
Nuc Rest EF: 63 %
Nuc Stress EF: 64 %
Rest MBF: 0.99 ml/g/min
Rest Nuclear Isotope Dose: 27.3 mCi
ST Depression (mm): 0 mm
Stress MBF: 1.79 ml/g/min
Stress Nuclear Isotope Dose: 26.9 mCi

## 2023-09-26 MED ORDER — RUBIDIUM RB82 GENERATOR (RUBYFILL)
26.9000 | PACK | Freq: Once | INTRAVENOUS | Status: AC
  Administered 2023-09-26: 26.9 via INTRAVENOUS

## 2023-09-26 MED ORDER — REGADENOSON 0.4 MG/5ML IV SOLN
INTRAVENOUS | Status: AC
  Filled 2023-09-26: qty 5

## 2023-09-26 MED ORDER — REGADENOSON 0.4 MG/5ML IV SOLN
0.4000 mg | Freq: Once | INTRAVENOUS | Status: AC
  Administered 2023-09-26: 0.4 mg via INTRAVENOUS

## 2023-09-26 MED ORDER — RUBIDIUM RB82 GENERATOR (RUBYFILL)
27.3300 | PACK | Freq: Once | INTRAVENOUS | Status: AC
  Administered 2023-09-26: 27.33 via INTRAVENOUS

## 2023-09-26 NOTE — Progress Notes (Signed)
Tolerated scan well 

## 2023-09-26 NOTE — Telephone Encounter (Signed)
Call to patient to discuss stress test results, per Dr. Mayford Knife "stress test was fine  -okay to proceed with ablation". Patient verbalizes understanding.

## 2023-09-26 NOTE — Addendum Note (Signed)
Addended by: Quintella Reichert on: 09/26/2023 09:31 AM   Modules accepted: Orders

## 2023-09-26 NOTE — Telephone Encounter (Signed)
  Shared Decision Making/Informed Consent The risks [chest pain, shortness of breath, cardiac arrhythmias, dizziness, blood pressure fluctuations, myocardial infarction, stroke/transient ischemic attack, nausea, vomiting, allergic reaction, radiation exposure, metallic taste sensation and life-threatening complications (estimated to be 1 in 10,000)], benefits (risk stratification, diagnosing coronary artery disease, treatment guidance) and alternatives of a cardiac PET stress test were discussed in detail with Christian [name] and he agrees to proceed.

## 2023-09-26 NOTE — Telephone Encounter (Signed)
-----   Message from [name] sent at 09/26/2023 12:06 PM EDT ----- Please let patient know that stress test was fine  -okay to proceed with ablation

## 2023-09-27 ENCOUNTER — Ambulatory Visit: Payer: Medicare Other | Admitting: Bariatrics

## 2023-09-28 ENCOUNTER — Ambulatory Visit: Payer: Medicare Other | Admitting: Bariatrics

## 2023-10-03 ENCOUNTER — Ambulatory Visit (HOSPITAL_COMMUNITY)
Admission: RE | Admit: 2023-10-03 | Discharge: 2023-10-03 | Disposition: A | Discharge status: Home / Self Care | Payer: Medicare Other | Source: Ambulatory Visit | Attending: Physician Assistant | Admitting: Physician Assistant

## 2023-10-03 ENCOUNTER — Encounter (HOSPITAL_COMMUNITY): Payer: Self-pay | Admitting: Physician Assistant

## 2023-10-03 VITALS — BP 134/88 | HR 55 | Ht 71.0 in | Wt 248.6 lb

## 2023-10-03 DIAGNOSIS — I4819 Other persistent atrial fibrillation: Secondary | ICD-10-CM | POA: Diagnosis not present

## 2023-10-03 DIAGNOSIS — D6869 Other thrombophilia: Secondary | ICD-10-CM | POA: Insufficient documentation

## 2023-10-03 DIAGNOSIS — E669 Obesity, unspecified: Secondary | ICD-10-CM | POA: Insufficient documentation

## 2023-10-03 DIAGNOSIS — Z6834 Body mass index (BMI) 34.0-34.9, adult: Secondary | ICD-10-CM | POA: Diagnosis not present

## 2023-10-03 DIAGNOSIS — E785 Hyperlipidemia, unspecified: Secondary | ICD-10-CM | POA: Diagnosis not present

## 2023-10-03 DIAGNOSIS — Z5181 Encounter for therapeutic drug level monitoring: Secondary | ICD-10-CM

## 2023-10-03 DIAGNOSIS — Z79899 Other long term (current) drug therapy: Secondary | ICD-10-CM | POA: Insufficient documentation

## 2023-10-03 DIAGNOSIS — I10 Essential (primary) hypertension: Secondary | ICD-10-CM | POA: Insufficient documentation

## 2023-10-03 DIAGNOSIS — Z7901 Long term (current) use of anticoagulants: Secondary | ICD-10-CM | POA: Diagnosis not present

## 2023-10-03 DIAGNOSIS — I2510 Atherosclerotic heart disease of native coronary artery without angina pectoris: Secondary | ICD-10-CM | POA: Insufficient documentation

## 2023-10-03 NOTE — Progress Notes (Signed)
Primary Care Physician: [name] Primary Cardiologist: Dr. Mayford Knife Primary Electrophysiologist: Dr [name]  Referring Physician: Gaston Islam., NP   Christian [name] is a 77 y.o. male with a history of CAD (heart cath 2017 showing mild to moderate disease), HTN, HLD, PVCs, and persistent atrial fibrillation who presents for follow up in the [facility]. Seen by cardiology on 01/26/23 and found to be in Afib with RVR. He underwent DCCV on 02/14/23 with successful conversion to sinus bradycardia with 1 shock. He was seen for DCCV f/u on 02/24/23 and found to be back in Afib. Cardiac monitor placed at that time showed 100% Afib burden. Currently taking cardizem 120 mg once daily, Toprol 100 mg daily, and Xarelto 20 mg daily. The patient was initially diagnosed with atrial fibrillation on 01/26/23 after presenting to cardiology with symptoms of heart racing, dizziness, shortness of breath, and weakness. Patient is on Xarelto 20 mg daily for a CHADS2VASC score of 4.  He was started on amiodarone as a bridge to ablation and is s/p successful DCCV on 05/23/23. Now s/p afib ablation 09/05/23.  On follow up today, patient reports that he feels about the same post ablation. He remains in SR. He did have some chest discomfort for the first few days post ablation and also for a few seconds this morning. The discomfort was not associated with activity. He denies any groin issues.   Today, he denies symptoms of palpitations, orthopnea, PND, lower extremity edema, presyncope, syncope, snoring, daytime somnolence, bleeding, or neurologic sequela. The patient is tolerating medications without difficulties and is otherwise without complaint today.   Atrial Fibrillation Risk Factors:  he does have symptoms or diagnosis of sleep apnea. he does not have a history of rheumatic fever. he does not have a history of alcohol use. The patient does have a history of early familial  atrial fibrillation or other arrhythmias.   Atrial Fibrillation Management history:  Previous antiarrhythmic drugs: amiodarone  Previous cardioversions: 02/14/23, 05/23/23 Previous ablations: 09/05/23 Anticoagulation history: Xarelto    Past Medical History:  Diagnosis Date   Arthritis    "joints; shoulders; left elbow; right thumb" (05/06/2014)   Atrial tachycardia (HCC)    Breathing problem    Cataracts, bilateral    immature   Chest pain    Constipation    takes Colace and Metamucil daily   Coronary artery disease 02/2011   cath 11/2016 showing 20% RCA, 50% OM, 35% mid LCx, 70% D1, 65% mid LAD, 70% distal LAD 80% on medical management   Diastolic dysfunction    GERD (gastroesophageal reflux disease)    Heart murmur    Hemorrhoids    History of bronchitis    Hypercholesterolemia    takes Crestor daily   Hypertension    Joint pain    Joint pain    Nocturia    Orthostatic hypotension    Palpitations    Premature atrial contractions    PVC's (premature ventricular contractions)    Restless leg syndrome    Rheumatoid arthritis (HCC)    Sciatica    Sinus bradycardia     Current Outpatient Medications  Medication Sig Dispense Refill   amiodarone (PACERONE) 200 MG tablet Take one (1) tablet by mouth daily 90 tablet 3   diltiazem (CARDIZEM CD) 120 MG 24 hr capsule Take 1 capsule (120 mg total) by mouth daily. 90 capsule 3   FIBER PO Take 1 Dose by mouth daily. 1 dose =  2 tablespoons     furosemide (LASIX) 40 MG tablet Take one tablet (40 mg) by mouth every day for three days only then take one tablet every other day thereafter. 90 tablet 3   ibuprofen (ADVIL) 200 MG tablet Take 400 mg by mouth every 6 (six) hours as needed for moderate pain.     irbesartan (AVAPRO) 150 MG tablet Take 1 tablet (150 mg total) by mouth daily. 90 tablet 3   isosorbide mononitrate (IMDUR) 60 MG 24 hr tablet TAKE 1 TABLET BY MOUTH EVERY DAY 90 tablet 0   polyethylene glycol (MIRALAX / GLYCOLAX)  17 g packet Take 17 g by mouth daily as needed for moderate constipation.     potassium chloride (KLOR-CON) 10 MEQ tablet Take one tablet (10 meq) by mouth every day for three days only then take one tablet every other day thereafter. 90 tablet 3   rivaroxaban (XARELTO) 20 MG TABS tablet Take 1 tablet (20 mg total) by mouth daily with supper. 90 tablet 3   rosuvastatin (CRESTOR) 20 MG tablet TAKE 1 TABLET BY MOUTH EVERY DAY 90 tablet 3   No current facility-administered medications for this encounter.    ROS- All systems are reviewed and negative except as per the HPI above.  Physical Exam: Vitals:   10/03/23 1125  BP: 134/88  Pulse: (!) 55  Weight: 112.8 kg  Height: 5\' 11"  (1.803 m)     GEN: Well nourished, well developed in no acute distress NECK: No JVD; No carotid bruits CARDIAC: Regular rate and rhythm, no murmurs, rubs, gallops RESPIRATORY:  Clear to auscultation without rales, wheezing or rhonchi  ABDOMEN: Soft, non-tender, non-distended EXTREMITIES:  No edema; No deformity    Wt Readings from Last 3 Encounters:  10/03/23 112.8 kg  09/05/23 104.3 kg  08/02/23 106.1 kg    EKG today demonstrates SB, 1st degree AV block Vent. rate 55 BPM PR interval 230 ms QRS duration 88 ms QT/QTcB 436/417 ms  Echo 2/20//24 (TEE) demonstrated: 1. Left ventricular ejection fraction, by estimation, is 60 to 65%. The  left ventricle has normal function.   2. Right ventricular systolic function is normal. The right ventricular  size is normal. There is normal pulmonary artery systolic pressure.   3. Left atrial size was mildly dilated. No left atrial/left atrial  appendage thrombus was detected. The LAA emptying velocity was 52 cm/s.   4. Right atrial size was mildly dilated.   5. The mitral valve is grossly normal. Mild to moderate mitral valve  regurgitation.   6. The aortic valve is tricuspid. There is mild calcification of the  aortic valve. There is mild thickening of the  aortic valve. Aortic valve  regurgitation is mild. Aortic valve sclerosis/calcification is present,  without any evidence of aortic  stenosis.   7. Aortic dilatation noted. There is mild dilatation of the ascending  aorta, measuring 43 mm.   8. Following TEE, the patient underwent successful DCCV with 1 shock at  150J.   Cardiac monitor 3/5-19/24: 100% Afib burden PVC load 4.1%  Epic records are reviewed at length today.  CHA2DS2-VASc Score = 4  The patient's score is based upon: CHF History: 1 HTN History: 1 Diabetes History: 0 Stroke History: 0 Vascular Disease History: 0 Age Score: 2 Gender Score: 0       ASSESSMENT AND PLAN: Persistent Atrial Fibrillation (ICD10:  I48.19) The patient's CHA2DS2-VASc score is 4, indicating a 4.8% annual risk of stroke.   S/p afib ablation 09/05/23  Patient appears to be maintaining SR Continue amiodarone 200 mg daily for now. Continue Xarelto 20 mg daily with no missed doses for 3 months post ablation. Continue diltiazem 120 mg daily  Secondary Hypercoagulable State (ICD10:  D68.69) The patient is at significant risk for stroke/thromboembolism based upon his CHA2DS2-VASc Score of 4.  Continue Rivaroxaban (Xarelto).   Obesity Body mass index is 34.67 kg/m.  Encouraged lifestyle modification  HTN Stable on current regimen  CAD CAC score 1887 PET stress test 09/26/23 showed no ischemia    Follow up with Dr Mayford Knife and Dr [name] as scheduled.    Jorja Loa PA-C Afib Clinic [facility] [address] [place], [place] [address] [phone] 10/03/2023 11:50 AM

## 2023-10-25 ENCOUNTER — Ambulatory Visit (INDEPENDENT_AMBULATORY_CARE_PROVIDER_SITE_OTHER): Payer: Medicare Other | Admitting: Bariatrics

## 2023-10-25 ENCOUNTER — Encounter: Payer: Self-pay | Admitting: Bariatrics

## 2023-10-25 VITALS — BP 149/85 | HR 56 | Temp 97.5°F | Ht 71.0 in | Wt 244.0 lb

## 2023-10-25 DIAGNOSIS — I10 Essential (primary) hypertension: Secondary | ICD-10-CM | POA: Diagnosis not present

## 2023-10-25 DIAGNOSIS — E669 Obesity, unspecified: Secondary | ICD-10-CM

## 2023-10-25 DIAGNOSIS — Z6834 Body mass index (BMI) 34.0-34.9, adult: Secondary | ICD-10-CM

## 2023-10-25 NOTE — Progress Notes (Signed)
   WEIGHT SUMMARY AND BIOMETRICS  Weight Lost Since Last Visit: 0  Weight Gained Since Last Visit: 10lb   Vitals Temp: (!) 97.5 F (36.4 C) BP: (!) 149/85 Pulse Rate: (!) 56 SpO2: 96 %   Anthropometric Measurements Height: 5\' 11"  (1.803 m) Weight: 244 lb (110.7 kg) BMI (Calculated): 34.05 Weight at Last Visit: 234lb Weight Lost Since Last Visit: 0 Weight Gained Since Last Visit: 10lb Starting Weight: 278lb Total Weight Loss (lbs): 34 lb (15.4 kg)   Body Composition  Body Fat %: 36.5 % Fat Mass (lbs): 89 lbs Muscle Mass (lbs): 147.4 lbs Total Body Water (lbs): 113.2 lbs Visceral Fat Rating : 23   Other Clinical Data Fasting: no Labs: no Today's Visit #: 35 Starting Date: 06/24/20    OBESITY Christian [name] is here to discuss his progress with his obesity treatment plan along with follow-up of his obesity related diagnoses.     Nutrition Plan: the Category 1 plan - 50% adherence.  Current exercise: walking  Interim History:  He is up 10 lbs since his last visit.  Eating all of the food on the plan., Is not skipping meals, Meeting protein goals., and Water intake is adequate.  Hunger is moderately controlled.  Cravings are moderately controlled.  Assessment/Plan:   Hypertension Hypertension control uncertain.  Medication(s): Avapro and Cardizem  BP Readings from Last 3 Encounters:  10/25/23 (!) 149/85  10/03/23 134/88  09/26/23 120/67   Lab Results  Component Value Date   CREATININE 1.16 08/22/2023   CREATININE 1.20 05/23/2023   CREATININE 1.06 02/10/2023   Lab Results  Component Value Date   GFR 62.19 04/09/2015   GFR 68.87 11/15/2013    Plan: Continue all antihypertensives at current dosages. No added salt. Will keep sodium content to 1,500 mg or less per day.     Generalized Obesity: Current BMI BMI (Calculated): 34.05    Christian [name] is currently in the action stage of change. As such, his goal is to continue with weight loss  efforts. He has struggled some in the last few months due to health issues.  He has agreed to the Category 1 plan.  Exercise goals: Older adults should do exercises that maintain or improve balance if they are at risk of falling.   Behavioral modification strategies: increasing lean protein intake, meal planning , increase water intake, better snacking choices, planning for success, keep healthy foods in the home, and mindful eating.  Christian [name] has agreed to follow-up with our clinic as needed.      Objective:   VITALS: Per patient if applicable, see vitals. GENERAL: Alert and in no acute distress. CARDIOPULMONARY: No increased WOB. Speaking in clear sentences.  PSYCH: Pleasant and cooperative. Speech normal rate and rhythm. Affect is appropriate. Insight and judgement are appropriate. Attention is focused, linear, and appropriate.  NEURO: Oriented as arrived to appointment on time with no prompting.   Attestation Statements:   This was prepared with the assistance of Engineer, civil (consulting).  Occasional wrong-word or sound-a-like substitutions may have occurred due to the inherent limitations of voice recognition software.   Corinna Capra, DO

## 2023-10-31 ENCOUNTER — Ambulatory Visit: Payer: Medicare Other | Admitting: Cardiology

## 2023-11-08 DIAGNOSIS — J069 Acute upper respiratory infection, unspecified: Secondary | ICD-10-CM | POA: Diagnosis not present

## 2023-11-22 NOTE — Progress Notes (Signed)
Cardiology Office Note:  .   Date:  12/05/2023  ID:  Christian [name], DOB 01/04/1946, MRN [id number] PCP: [name]  [facility] Providers Cardiologist:  [name], MD Electrophysiologist:  Regan Lemming, MD    History of Present Illness: Christian [name] is a 77 y.o. male with a history of CAD (heart cath 2017 showing mild to moderate disease), HTN, HLD, PVCs, and persistent atrial fibrillations/p afib ablation 09/05/23.  Patient comes in for f/u. He has an occasional left chest pain that last a second, last occurred 3 weeks. He was just sitting watching TV. Trouble describing it. No tightness or pressure, no sharp pain or ache. He thinks it was just healing from ablation. Doesn't think it was Afib. Trying to walk but says Xarelto makes him stay cold all the time so trouble getting out. Walks 35 min 6 days/week.  ROS:    Studies Reviewed: [name] Kitchen         Prior CV Studies: NM PET CT CARDIAC PERFUSION MULTI W/ABSOLUTE BLOODFLOW 09/26/2023  Narrative   LV perfusion is normal. There is no evidence of ischemia. There is no evidence of infarction.   Rest left ventricular function is normal. Rest EF: 63%. Stress left ventricular function is normal. Stress EF: 64%. End diastolic cavity size is normal. End systolic cavity size is normal.   Myocardial blood flow was computed to be 0.41ml/g/min at rest and 1.37ml/g/min at stress. Global myocardial blood flow reserve was 1.81 and was mildly abnormal.   Coronary calcium was present on the attenuation correction CT images. Severe coronary calcifications were present. Coronary calcifications were present in the left anterior descending artery, left circumflex artery and right coronary artery distribution(s).   The study is normal. The study is low risk.   MBFR mildly abnormal likely due to high resting flow  EXAM: OVER-READ INTERPRETATION CARDIAC CT CHEST  The following report is an over-read performed by radiologist  Dr. Gaylyn Rong of [facility] on 09/26/2023. This over-read does not include interpretation of cardiac or coronary anatomy or pathology. The cardiac and PET interpretation by the cardiologist is attached or will be attached.  COMPARISON:  08/29/2023  FINDINGS: Extracardiac Vascular: Unremarkable  Mediastinum: Unremarkable  Lung: Mild dependent subsegmental atelectasis in the right lower lobe.  Included Upper Abdomen: Unremarkable  Musculoskeletal: Unremarkable  IMPRESSION: 1. Mild dependent subsegmental atelectasis in the right lower lobe.   Electronically Signed By: Gaylyn Rong M.D. On: 09/26/2023 11:05    Echo 2/20//24 (TEE) demonstrated: 1. Left ventricular ejection fraction, by estimation, is 60 to 65%. The  left ventricle has normal function.   2. Right ventricular systolic function is normal. The right ventricular  size is normal. There is normal pulmonary artery systolic pressure.   3. Left atrial size was mildly dilated. No left atrial/left atrial  appendage thrombus was detected. The LAA emptying velocity was 52 cm/s.   4. Right atrial size was mildly dilated.   5. The mitral valve is grossly normal. Mild to moderate mitral valve  regurgitation.   6. The aortic valve is tricuspid. There is mild calcification of the  aortic valve. There is mild thickening of the aortic valve. Aortic valve  regurgitation is mild. Aortic valve sclerosis/calcification is present,  without any evidence of aortic  stenosis.   7. Aortic dilatation noted. There is mild dilatation of the ascending  aorta, measuring 43 mm.   8. Following TEE, the patient underwent successful DCCV with  1 shock at  150J.    Cardiac monitor 3/5-19/24: 100% Afib burden PVC load 4.1%    Risk Assessment/Calculations:    CHA2DS2-VASc Score = 4   This indicates a 4.8% annual risk of stroke. The patient's score is based upon: CHF History: 1 HTN History: 1 Diabetes History:  0 Stroke History: 0 Vascular Disease History: 0 Age Score: 2 Gender Score: 0    HYPERTENSION CONTROL Vitals:   12/05/23 0837 12/05/23 0849  BP: (!) 122/90 (!) 122/90    The patient's blood pressure is elevated above target today.  In order to address the patient's elevated BP: Blood pressure will be monitored at home to determine if medication changes need to be made.; The blood pressure is usually elevated in clinic.  Blood pressures monitored at home have been optimal.; Follow up with general cardiology has been recommended.          Physical Exam:   VS:  BP (!) 122/90   Pulse 65   Ht 5\' 11"  (1.803 m)   Wt 247 lb 9.6 oz (112.3 kg)   SpO2 94%   BMI 34.53 kg/m    Wt Readings from Last 3 Encounters:  12/05/23 247 lb 9.6 oz (112.3 kg)  10/25/23 244 lb (110.7 kg)  10/03/23 248 lb 9.6 oz (112.8 kg)    GEN: Obese, in no acute distress NECK: No JVD; No carotid bruits CARDIAC: RRR, no murmurs, rubs, gallops RESPIRATORY:  Clear to auscultation without rales, wheezing or rhonchi  ABDOMEN: Soft, non-tender, non-distended EXTREMITIES:  No edema; No deformity   ASSESSMENT AND PLAN: .    Persistent Atrial Fibrillation   The patient's CHA2DS2-VASc score is 4, indicating a 4.8% annual risk of stroke.   S/p afib ablation 09/05/23 Patient appears to be maintaining SR Continue amiodarone 200 mg daily for now.Appt with Dr. [name] 12/24 Continue Xarelto 20 mg daily with no missed doses for 3 months post ablation. Continue diltiazem 120 mg daily Check labs today   Secondary Hypercoagulable State  The patient is at significant risk for stroke/thromboembolism based upon his CHA2DS2-VASc Score of 4.  Continue Rivaroxaban (Xarelto).    Obesity Body mass index is 34.67 kg/m.  Encouraged lifestyle modification Continue 150 min exercise   HTN Diastolic BP runs high 80's low 90's at home. Discussed low salt diet. Consider increase in diltiazem once amiodarone is stopped. He'll bring BP  readings to next visit.   CAD CAC score 1887 PET stress test 09/26/23 showed no ischemia  no angina Atypical chest pain-monitor  HLD on crestor-check FLP today.            Dispo: f/u as scheduled  Signed, Jacolyn Reedy, PA-C

## 2023-11-30 DIAGNOSIS — H903 Sensorineural hearing loss, bilateral: Secondary | ICD-10-CM | POA: Diagnosis not present

## 2023-12-04 DIAGNOSIS — H35033 Hypertensive retinopathy, bilateral: Secondary | ICD-10-CM | POA: Diagnosis not present

## 2023-12-04 DIAGNOSIS — Z1211 Encounter for screening for malignant neoplasm of colon: Secondary | ICD-10-CM | POA: Diagnosis not present

## 2023-12-04 DIAGNOSIS — I10 Essential (primary) hypertension: Secondary | ICD-10-CM | POA: Diagnosis not present

## 2023-12-04 DIAGNOSIS — Z23 Encounter for immunization: Secondary | ICD-10-CM | POA: Diagnosis not present

## 2023-12-04 DIAGNOSIS — Z0000 Encounter for general adult medical examination without abnormal findings: Secondary | ICD-10-CM | POA: Diagnosis not present

## 2023-12-04 DIAGNOSIS — I2510 Atherosclerotic heart disease of native coronary artery without angina pectoris: Secondary | ICD-10-CM | POA: Diagnosis not present

## 2023-12-05 ENCOUNTER — Encounter: Payer: Self-pay | Admitting: Physician Assistant

## 2023-12-05 ENCOUNTER — Ambulatory Visit: Payer: Medicare Other | Attending: Physician Assistant | Admitting: Physician Assistant

## 2023-12-05 VITALS — BP 122/90 | HR 65 | Ht 71.0 in | Wt 247.6 lb

## 2023-12-05 DIAGNOSIS — E7849 Other hyperlipidemia: Secondary | ICD-10-CM

## 2023-12-05 DIAGNOSIS — Z6839 Body mass index (BMI) 39.0-39.9, adult: Secondary | ICD-10-CM | POA: Insufficient documentation

## 2023-12-05 DIAGNOSIS — D6869 Other thrombophilia: Secondary | ICD-10-CM | POA: Diagnosis not present

## 2023-12-05 DIAGNOSIS — I4819 Other persistent atrial fibrillation: Secondary | ICD-10-CM

## 2023-12-05 DIAGNOSIS — I10 Essential (primary) hypertension: Secondary | ICD-10-CM

## 2023-12-05 DIAGNOSIS — E66812 Obesity, class 2: Secondary | ICD-10-CM | POA: Insufficient documentation

## 2023-12-05 DIAGNOSIS — I2510 Atherosclerotic heart disease of native coronary artery without angina pectoris: Secondary | ICD-10-CM

## 2023-12-05 LAB — CBC

## 2023-12-05 NOTE — Patient Instructions (Signed)
Medication Instructions:  No changes *If you need a refill on your cardiac medications before your next appointment, please call your pharmacy*   Lab Work: CBC, CMET, Lipid Panel If you have labs (blood work) drawn today and your tests are completely normal, you will receive your results only by: MyChart Message (if you have MyChart) OR A paper copy in the mail If you have any lab test that is abnormal or we need to change your treatment, we will call you to review the results.   Testing/Procedures: No Testing   Follow-Up: At [facility], you and your health needs are our priority.  As part of our continuing mission to provide you with exceptional heart care, we have created designated Provider Care Teams.  These Care Teams include your primary Cardiologist (physician) and Advanced Practice Providers (APPs -  Physician Assistants and Nurse Practitioners) who all work together to provide you with the care you need, when you need it.  We recommend signing up for the patient portal called "MyChart".  Sign up information is provided on this After Visit Summary.  MyChart is used to connect with patients for Virtual Visits (Telemedicine).  Patients are able to view lab/test results, encounter notes, upcoming appointments, etc.  Non-urgent messages can be sent to your provider as well.   To learn more about what you can do with MyChart, go to [email or web address].    Your next appointment:   6 month(s)  Provider:   [name], MD   Other Instructions Bring Blood Pressure Log to Next Appointment

## 2023-12-06 LAB — CBC
Hematocrit: 43.9 % (ref 37.5–51.0)
Hemoglobin: 14.6 g/dL (ref 13.0–17.7)
MCH: 33.6 pg — ABNORMAL HIGH (ref 26.6–33.0)
MCHC: 33.3 g/dL (ref 31.5–35.7)
MCV: 101 fL — ABNORMAL HIGH (ref 79–97)
Platelets: 173 10*3/uL (ref 150–450)
RBC: 4.35 x10E6/uL (ref 4.14–5.80)
RDW: 12 % (ref 11.6–15.4)
WBC: 7 10*3/uL (ref 3.4–10.8)

## 2023-12-06 LAB — COMPREHENSIVE METABOLIC PANEL
ALT: 9 [IU]/L (ref 0–44)
AST: 19 [IU]/L (ref 0–40)
Albumin: 4 g/dL (ref 3.8–4.8)
Alkaline Phosphatase: 73 [IU]/L (ref 44–121)
BUN/Creatinine Ratio: 15 (ref 10–24)
BUN: 17 mg/dL (ref 8–27)
Bilirubin Total: 0.7 mg/dL (ref 0.0–1.2)
CO2: 26 mmol/L (ref 20–29)
Calcium: 9.2 mg/dL (ref 8.6–10.2)
Chloride: 101 mmol/L (ref 96–106)
Creatinine, Ser: 1.13 mg/dL (ref 0.76–1.27)
Globulin, Total: 2.5 g/dL (ref 1.5–4.5)
Glucose: 86 mg/dL (ref 70–99)
Potassium: 4.3 mmol/L (ref 3.5–5.2)
Sodium: 139 mmol/L (ref 134–144)
Total Protein: 6.5 g/dL (ref 6.0–8.5)
eGFR: 67 mL/min/{1.73_m2} (ref >59)

## 2023-12-06 LAB — LIPID PANEL
Chol/HDL Ratio: 2.2 {ratio} (ref 0.0–5.0)
Cholesterol, Total: 137 mg/dL (ref 100–199)
HDL: 62 mg/dL (ref >39)
LDL Chol Calc (NIH): 62 mg/dL (ref 0–99)
Triglycerides: 63 mg/dL (ref 0–149)
VLDL Cholesterol Cal: 13 mg/dL (ref 5–40)

## 2023-12-17 NOTE — Progress Notes (Unsigned)
Cardiology Office Note:  .   Date:  12/17/2023  ID:  Christian [name], DOB Sep 12, 1946, MRN [id number] PCP: [name]  [name] Providers Cardiologist:  [name], MD Electrophysiologist:  [name], MD {  History of Present Illness: Christian [name] is a 77 y.o. male w/PMHx of CAD (CA++ score 1887), HTN, HLD, PVCs, AFib  Referred to Dr. [name] with symptomatic AFib w/ERAF post DCCV  PVI ablation 09/05/23  Saw the AFib clinic 10/03/23,  had some CP post procedure, was feeling well this day, maintaining SR, no changes made  Saw cards team 12/05/23, reported CP, momentary/seconds, random.  Walking regularly CP atypical, normal stress PET Oct 2024. No changes were made  Today's visit is scheduled as his 3 mo post AFib ablation visit  ROS:   He comes today accompanied by his wife. 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 No symptoms of his Afib (racing heart/SOB) He does get random fleeting/sharp CPs, these have been discussed and evalyated as above, with no evidence of obstructive heart disease) No SOB, dizziness, near syncope or syncope No bleeding or signs of bleeding   Arrhythmia/AAD hx Afib found Feb 2024 Amiodarone started May 2024 as bridge to ablation PVI ablation (PFA) 09/05/23  Studies Reviewed: [name] Kitchen    EKG done today and reviewed by myself:  SR 63bpm   NM PET CT CARDIAC PERFUSION MULTI W/ABSOLUTE BLOODFLOW 09/26/2023 Narrative   LV perfusion is normal. There is no evidence of ischemia. There is no evidence of infarction.   Rest left ventricular function is normal. Rest EF: 63%. Stress left ventricular function is normal. Stress EF: 64%. End diastolic cavity size is normal. End systolic cavity size is normal.   Myocardial blood flow was computed to be 0.86ml/g/min at rest and 1.61ml/g/min at stress. Global myocardial blood flow reserve was 1.81 and was mildly abnormal.   Coronary  calcium was present on the attenuation correction CT images. Severe coronary calcifications were present. Coronary calcifications were present in the left anterior descending artery, left circumflex artery and right coronary artery distribution(s).   The study is normal. The study is low risk.   MBFR mildly abnormal likely due to high resting flow   09/05/23: EPS/ablation CONCLUSIONS: 1. Sinus rhythm upon presentation.   2. Successful electrical isolation and anatomical encircling of all four pulmonary veins with pulsed field ablation. 3. Successful posterior wall isolation 4. No early apparent complications.  TEE 02/14/23   1. Left ventricular ejection fraction, by estimation, is 60 to 65%. The  left ventricle has normal function.   2. Right ventricular systolic function is normal. The right ventricular  size is normal. There is normal pulmonary artery systolic pressure.   3. Left atrial size was mildly dilated. No left atrial/left atrial  appendage thrombus was detected. The LAA emptying velocity was 52 cm/s.   4. Right atrial size was mildly dilated.   5. The mitral valve is grossly normal. Mild to moderate mitral valve  regurgitation.   6. The aortic valve is tricuspid. There is mild calcification of the  aortic valve. There is mild thickening of the aortic valve. Aortic valve  regurgitation is mild. Aortic valve sclerosis/calcification is present,  without any evidence of aortic  stenosis.   7. Aortic dilatation noted. There is mild dilatation of the ascending  aorta, measuring 43 mm.    Cardiac monitor 03/21/2023   Predominant rhythm was atrial fibrillation  with 100% afib burden with average heart rate 97bpm and ranged from 51 to 169bpm.   Multiple episodes of wide complex rhythm with fastest interval 160bpm and longest interval 9 beats. May represent nonsustained VT vs. aberration in setting of afib.   Occasional PVCs, ventricular couplets and triplets with PVC load  4.1%   Risk Assessment/Calculations:    Physical Exam:   VS:  There were no vitals taken for this visit.   Wt Readings from Last 3 Encounters:  12/05/23 247 lb 9.6 oz (112.3 kg)  10/25/23 244 lb (110.7 kg)  10/03/23 248 lb 9.6 oz (112.8 kg)    GEN: Well nourished, well developed in no acute distress NECK: No JVD; No carotid bruits CARDIAC: RRR, no murmurs, rubs, gallops RESPIRATORY:  CTA b/l without rales, wheezing or rhonchi  ABDOMEN: Soft, non-tender, non-distended EXTREMITIES:  No edema; No deformity    ASSESSMENT AND PLAN: .    persistent AFib CHA2DS2Vasc is 4, on Xarelto, appropriately dosed Low/no burden by symptoms stop amiodarone    CAD Non-obstructive by cath 2017) High Ca++ score Normal/low risk stress PET Oct 2024 Sounds atypical C/w Dr. Norberto Sorenson  HTN BP wobbles up/down byu his home readings Looks good here today C/w his PMD/Dr. Norberto Sorenson  Secondary hypercoagulable state 2/2 AFib   Dispo: back in 4 mo or so, off amiodarone, sooner if needed  Signed, [name], PA-C

## 2023-12-19 ENCOUNTER — Ambulatory Visit: Payer: Medicare Other | Attending: Cardiology | Admitting: Physician Assistant

## 2023-12-19 ENCOUNTER — Encounter: Payer: Self-pay | Admitting: Physician Assistant

## 2023-12-19 VITALS — BP 118/62 | HR 63 | Ht 71.0 in | Wt 248.6 lb

## 2023-12-19 DIAGNOSIS — I10 Essential (primary) hypertension: Secondary | ICD-10-CM

## 2023-12-19 DIAGNOSIS — R0789 Other chest pain: Secondary | ICD-10-CM

## 2023-12-19 DIAGNOSIS — D6869 Other thrombophilia: Secondary | ICD-10-CM | POA: Diagnosis not present

## 2023-12-19 DIAGNOSIS — I4819 Other persistent atrial fibrillation: Secondary | ICD-10-CM | POA: Diagnosis not present

## 2023-12-19 NOTE — Patient Instructions (Signed)
Medication Instructions:    STOP TAKING : AMIODARONE  *If you need a refill on your cardiac medications before your next appointment, please call your pharmacy*   Lab Work: NONE ORDERED  TODAY    If you have labs (blood work) drawn today and your tests are completely normal, you will receive your results only by: MyChart Message (if you have MyChart) OR A paper copy in the mail If you have any lab test that is abnormal or we need to change your treatment, we will call you to review the results.   Testing/Procedures:  NONE ORDERED  TODAY    Follow-Up: At [facility], you and your health needs are our priority.  As part of our continuing mission to provide you with exceptional heart care, we have created designated Provider Care Teams.  These Care Teams include your primary Cardiologist (physician) and Advanced Practice Providers (APPs -  Physician Assistants and Nurse Practitioners) who all work together to provide you with the care you need, when you need it.  We recommend signing up for the patient portal called "MyChart".  Sign up information is provided on this After Visit Summary.  MyChart is used to connect with patients for Virtual Visits (Telemedicine).  Patients are able to view lab/test results, encounter notes, upcoming appointments, etc.  Non-urgent messages can be sent to your provider as well.   To learn more about what you can do with MyChart, go to [email or web address].    Your next appointment:    4 month(s) ( CONTACT  CASSIE HALL/ ANGELINE HAMMER FOR EP SCHEDULING ISSUES )   Provider:   You may see [name], MD or one of the following Advanced Practice Providers on your designated Care Team:   Francis Dowse, [place]  Other Instructions

## 2023-12-19 NOTE — Addendum Note (Signed)
Addended by: Oleta Mouse on: 12/19/2023 09:44 AM   Modules accepted: Orders

## 2024-01-16 ENCOUNTER — Other Ambulatory Visit: Payer: Self-pay | Admitting: Internal Medicine

## 2024-01-20 ENCOUNTER — Other Ambulatory Visit: Payer: Self-pay | Admitting: Nurse Practitioner

## 2024-01-20 DIAGNOSIS — I10 Essential (primary) hypertension: Secondary | ICD-10-CM

## 2024-02-04 ENCOUNTER — Other Ambulatory Visit: Payer: Self-pay | Admitting: Internal Medicine

## 2024-02-05 NOTE — Telephone Encounter (Signed)
 Prescription refill request for Xarelto  received.  Indication:afib Last office visit:12/24 Weight:112.8  kg Age:78 Scr:1.13  12/24 CrCl:88.73  ml/min  Prescription refilled

## 2024-02-27 DIAGNOSIS — H903 Sensorineural hearing loss, bilateral: Secondary | ICD-10-CM | POA: Diagnosis not present

## 2024-03-01 ENCOUNTER — Other Ambulatory Visit: Payer: Self-pay | Admitting: Nurse Practitioner

## 2024-03-01 DIAGNOSIS — I4819 Other persistent atrial fibrillation: Secondary | ICD-10-CM

## 2024-03-04 ENCOUNTER — Other Ambulatory Visit: Payer: Self-pay | Admitting: Internal Medicine

## 2024-03-13 DIAGNOSIS — L821 Other seborrheic keratosis: Secondary | ICD-10-CM | POA: Diagnosis not present

## 2024-03-13 DIAGNOSIS — L570 Actinic keratosis: Secondary | ICD-10-CM | POA: Diagnosis not present

## 2024-03-13 DIAGNOSIS — D2271 Melanocytic nevi of right lower limb, including hip: Secondary | ICD-10-CM | POA: Diagnosis not present

## 2024-03-13 DIAGNOSIS — D485 Neoplasm of uncertain behavior of skin: Secondary | ICD-10-CM | POA: Diagnosis not present

## 2024-03-13 DIAGNOSIS — C44311 Basal cell carcinoma of skin of nose: Secondary | ICD-10-CM | POA: Diagnosis not present

## 2024-03-13 DIAGNOSIS — D044 Carcinoma in situ of skin of scalp and neck: Secondary | ICD-10-CM | POA: Diagnosis not present

## 2024-03-13 DIAGNOSIS — D225 Melanocytic nevi of trunk: Secondary | ICD-10-CM | POA: Diagnosis not present

## 2024-03-13 DIAGNOSIS — L578 Other skin changes due to chronic exposure to nonionizing radiation: Secondary | ICD-10-CM | POA: Diagnosis not present

## 2024-03-13 DIAGNOSIS — Z85828 Personal history of other malignant neoplasm of skin: Secondary | ICD-10-CM | POA: Diagnosis not present

## 2024-03-25 DIAGNOSIS — I2510 Atherosclerotic heart disease of native coronary artery without angina pectoris: Secondary | ICD-10-CM | POA: Diagnosis not present

## 2024-03-25 DIAGNOSIS — H35033 Hypertensive retinopathy, bilateral: Secondary | ICD-10-CM | POA: Diagnosis not present

## 2024-03-25 DIAGNOSIS — E7800 Pure hypercholesterolemia, unspecified: Secondary | ICD-10-CM | POA: Diagnosis not present

## 2024-04-02 DIAGNOSIS — M19011 Primary osteoarthritis, right shoulder: Secondary | ICD-10-CM | POA: Diagnosis not present

## 2024-04-02 DIAGNOSIS — M25511 Pain in right shoulder: Secondary | ICD-10-CM | POA: Diagnosis not present

## 2024-04-03 ENCOUNTER — Ambulatory Visit (INDEPENDENT_AMBULATORY_CARE_PROVIDER_SITE_OTHER): Admitting: Physician Assistant

## 2024-04-03 ENCOUNTER — Encounter: Payer: Self-pay | Admitting: Physician Assistant

## 2024-04-03 ENCOUNTER — Other Ambulatory Visit: Payer: Self-pay

## 2024-04-03 DIAGNOSIS — G8929 Other chronic pain: Secondary | ICD-10-CM

## 2024-04-03 DIAGNOSIS — C44311 Basal cell carcinoma of skin of nose: Secondary | ICD-10-CM | POA: Diagnosis not present

## 2024-04-03 DIAGNOSIS — M25511 Pain in right shoulder: Secondary | ICD-10-CM | POA: Diagnosis not present

## 2024-04-03 DIAGNOSIS — M19011 Primary osteoarthritis, right shoulder: Secondary | ICD-10-CM | POA: Diagnosis not present

## 2024-04-03 NOTE — Progress Notes (Unsigned)
 Office Visit Note   Patient: Christian [name]           Date of Birth: 1946-03-04           MRN: [id number] Visit Date: 04/03/2024              Requested by: Sheliah Hatch, PA-C [address] [place],  [place] [address] PCP: Sheliah Hatch, PA-C   Assessment & Plan: Visit Diagnoses: Osteoarthritis shoulder  Plan: Patient is a pleasant ambidextrous 78 year old gentleman has a history of right shoulder surgery with Dr. Cleophas Dunker in the remote past.  Has now developed significant arthritis in the glenohumeral joint.  Also has some tendon adenopathy issues in the rotator cuff.  From a conservative standpoint he could try an intra-articular injection.  He would like to try this.  This was done and referred to [facility] today.  May follow-up as needed  Follow-Up Instructions: Return if symptoms worsen or fail to improve.   Orders:  Orders Placed This Encounter  Procedures   US Guided Needle Placement - No Linked Charges   No orders of the defined types were placed in this encounter.     Procedures: No procedures performed   Clinical Data: No additional findings.   Subjective: No chief complaint on file.   HPI pleasant 78 year old gentleman former patient of Dr. Cleophas Dunker.  Underwent rotator cuff surgery distal clavicle excision many years ago.  Has now developed increased pain in his right shoulder.  No new injury.  Review of Systems  All other systems reviewed and are negative.    Objective: Vital Signs: There were no vitals taken for this visit.  Physical Exam Constitutional:      Appearance: Normal appearance.  Pulmonary:     Effort: Pulmonary effort is normal.  Skin:    General: Skin is warm and dry.  Neurological:     General: No focal deficit present.     Mental Status: He is alert and oriented to person, place, and time.  Psychiatric:        Mood and Affect: Mood normal.        Behavior: Behavior normal.     Ortho Exam Examination of his  right shoulder he is neurovascularly intact he has pain with forward elevation external rotation.  Positive empty can test positive speeds test.  Motion limited compared to left on the right side with external Specialty Comments:  No specialty comments available.  Imaging: No results found.   PMFS History: Patient Active Problem List   Diagnosis Date Noted   Primary osteoarthritis, right shoulder 04/03/2024   BMI 32.0-32.9,adult 08/02/2023   Encounter for monitoring amiodarone therapy 06/08/2023   Hypercoagulable state due to persistent atrial fibrillation (HCC) 04/10/2023   Visceral obesity 03/20/2023   Other fatigue 02/16/2023   Persistent atrial fibrillation (HCC) 02/14/2023   Retention of fluid 01/31/2023   BMI 31.0-31.9,adult 01/31/2023   Essential hypertension 11/02/2022   Polyphagia 09/19/2022   Insulin resistance 09/19/2022   Class 2 severe obesity with serious comorbidity and body mass index (BMI) of 39.0 to 39.9 in adult [facility]) 09/19/2022   Other hyperlipidemia 07/18/2022   Vitamin D deficiency 07/18/2022   Sarcopenia 05/11/2022   History of malignant neoplasm of skin 04/06/2022   Melanocytic nevi of trunk 04/06/2022   Other seborrheic keratosis 04/06/2022   History of obesity in adulthood 04/06/2022   Anal fissure 07/07/2021   Herniation of lumbar intervertebral disc with radiculopathy 03/30/2021    Class:  Acute   Status post lumbar laminectomy 03/30/2021   Restless legs 02/15/2021   Hypertensive retinopathy 02/15/2021   Constipation 02/15/2021   Chronic diastolic heart failure (HCC) 01/26/2021   Other forms of angina pectoris (HCC) 01/26/2021   Low back pain 12/16/2020   Primary osteoarthritis of left knee 02/05/2019   History of left knee replacement 02/05/2019   PVC's (premature ventricular contractions)    Dyspnea 12/06/2016   Osteoarthritis of right hip 09/22/2015   S/P total hip arthroplasty 09/22/2015   Generalized obesity 05/08/2014   Osteoarthritis  of knee 05/08/2014   S/P total knee replacement using cement 05/06/2014   Knee pain 04/08/2014   Preoperative clearance 04/08/2014   Coronary artery disease    Diastolic dysfunction    Hypertension    Orthostatic hypotension    Hypercholesterolemia    Past Medical History:  Diagnosis Date   Arthritis    "joints; shoulders; left elbow; right thumb" (05/06/2014)   Atrial tachycardia (HCC)    Breathing problem    Cataracts, bilateral    immature   Chest pain    Constipation    takes Colace and Metamucil daily   Coronary artery disease 02/2011   cath 11/2016 showing 20% RCA, 50% OM, 35% mid LCx, 70% D1, 65% mid LAD, 70% distal LAD 80% on medical management   Diastolic dysfunction    GERD (gastroesophageal reflux disease)    Heart murmur    Hemorrhoids    History of bronchitis    Hypercholesterolemia    takes Crestor daily   Hypertension    Joint pain    Joint pain    Nocturia    Orthostatic hypotension    Palpitations    Premature atrial contractions    PVC's (premature ventricular contractions)    Restless leg syndrome    Rheumatoid arthritis (HCC)    Sciatica    Sinus bradycardia     Family History  Problem Relation Age of Onset   CVA Mother    Stroke Mother    CVA Father    Heart disease Father    High blood pressure Father    High Cholesterol Father    Stroke Father     Past Surgical History:  Procedure Laterality Date   ACHILLES TENDON REPAIR Left    "& fixed a spur"   ATRIAL FIBRILLATION ABLATION N/A 09/05/2023   Procedure: ATRIAL FIBRILLATION ABLATION;  Surgeon: Regan Lemming, MD;  Location: [facility] INVASIVE CV LAB;  Service: Cardiovascular;  Laterality: N/A;   CARDIAC CATHETERIZATION  2012   CARDIAC CATHETERIZATION N/A 12/06/2016   Procedure: Left Heart Cath and Coronary Angiography;  Surgeon: Lyn Records, MD;  Location: [facility] INVASIVE CV LAB;  Service: Cardiovascular;  Laterality: N/A;   CARDIOVERSION N/A 02/14/2023   Procedure: CARDIOVERSION;   Surgeon: Meriam Sprague, MD;  Location: [facility] ENDOSCOPY;  Service: Cardiovascular;  Laterality: N/A;   CARDIOVERSION N/A 05/23/2023   Procedure: CARDIOVERSION;  Surgeon: Parke Poisson, MD;  Location: [facility] INVASIVE CV LAB;  Service: Cardiovascular;  Laterality: N/A;   COLONOSCOPY     FLEXIBLE SIGMOIDOSCOPY  X 2   HAND SURGERY Left    "thumb; cleaned out arthritis"   HEEL SPUR EXCISION Left    KNEE ARTHROSCOPY Right    LUMBAR LAMINECTOMY/DECOMPRESSION MICRODISCECTOMY N/A 03/30/2021   Procedure: LEFT L3 HEMILAMINECTOMY WITH EXCISION OF DISC HERNIATION;  Surgeon: Kerrin Champagne, MD;  Location: [facility] OR;  Service: Orthopedics;  Laterality: N/A;   SHOULDER SURGERY Right X 2   "  cleaned out spurs"   TEE WITHOUT CARDIOVERSION N/A 02/14/2023   Procedure: TRANSESOPHAGEAL ECHOCARDIOGRAM (TEE);  Surgeon: Meriam Sprague, MD;  Location: [facility] ENDOSCOPY;  Service: Cardiovascular;  Laterality: N/A;   TOE FUSION Right    great toe; plates and screws   TONSILLECTOMY  ~ 1950   TOTAL HIP ARTHROPLASTY Right 09/22/2015   Procedure: TOTAL HIP ARTHROPLASTY;  Surgeon: Valeria Batman, MD;  Location: [facility] OR;  Service: Orthopedics;  Laterality: Right;   TOTAL KNEE ARTHROPLASTY Right 05/06/2014   TOTAL KNEE ARTHROPLASTY Right 05/06/2014   Procedure: RIGHT TOTAL KNEE ARTHROPLASTY;  Surgeon: Valeria Batman, MD;  Location: [facility] OR;  Service: Orthopedics;  Laterality: Right;   TOTAL KNEE ARTHROPLASTY Left 02/05/2019   Procedure: LEFT TOTAL KNEE ARTHROPLASTY;  Surgeon: Valeria Batman, MD;  Location: [facility] OR;  Service: Orthopedics;  Laterality: Left;   Social History   Occupational History   Occupation: Retired  Tobacco Use   Smoking status: Former    Current packs/day: 0.00    Average packs/day: 2.0 packs/day for 10.0 years (20.0 ttl pk-yrs)    Types: Cigarettes    Start date: 44    Quit date: 1981    Years since quitting: 44.3   Smokeless tobacco: Never   Tobacco comments:    Former smoker (quit smoking  in 1981) 10/03/23  Vaping Use   Vaping status: Never Used  Substance and Sexual Activity   Alcohol use: Not Currently    Comment: "drink a beer sometimes once/month"   Drug use: No   Sexual activity: Never

## 2024-04-05 MED ORDER — LIDOCAINE HCL 1 % IJ SOLN
5.0000 mL | INTRAMUSCULAR | Status: AC | PRN
Start: 2024-04-03 — End: 2024-04-03
  Administered 2024-04-03: 5 mL

## 2024-04-05 MED ORDER — METHYLPREDNISOLONE ACETATE 40 MG/ML IJ SUSP
40.0000 mg | INTRAMUSCULAR | Status: AC | PRN
Start: 2024-04-03 — End: 2024-04-03
  Administered 2024-04-03: 40 mg via INTRA_ARTICULAR

## 2024-04-05 MED ORDER — BUPIVACAINE HCL 0.25 % IJ SOLN
9.0000 mL | INTRAMUSCULAR | Status: AC | PRN
Start: 2024-04-03 — End: 2024-04-03
  Administered 2024-04-03: 9 mL via INTRA_ARTICULAR

## 2024-04-05 NOTE — Progress Notes (Signed)
   Procedure Note  Patient: Christian [name]             Date of Birth: 08-06-1946           MRN: [id number]             Visit Date: 04/03/2024  Procedures: Visit Diagnoses:  1. Chronic right shoulder pain   2. Primary osteoarthritis, right shoulder     Large Joint Inj: R glenohumeral on 04/03/2024 11:50 AM Details: 22 G 3.5 in needle, ultrasound-guided posterior approach Medications: 5 mL lidocaine 1 %; 9 mL bupivacaine 0.25 %; 40 mg methylPREDNISolone acetate 40 MG/ML Outcome: tolerated well, no immediate complications Procedure, treatment alternatives, risks and benefits explained, specific risks discussed. Consent was given by the patient. Patient was prepped and draped in the usual sterile fashion.

## 2024-04-17 DIAGNOSIS — C4442 Squamous cell carcinoma of skin of scalp and neck: Secondary | ICD-10-CM | POA: Diagnosis not present

## 2024-04-24 DIAGNOSIS — H35033 Hypertensive retinopathy, bilateral: Secondary | ICD-10-CM | POA: Diagnosis not present

## 2024-04-24 DIAGNOSIS — E7800 Pure hypercholesterolemia, unspecified: Secondary | ICD-10-CM | POA: Diagnosis not present

## 2024-04-24 DIAGNOSIS — I2510 Atherosclerotic heart disease of native coronary artery without angina pectoris: Secondary | ICD-10-CM | POA: Diagnosis not present

## 2024-05-13 NOTE — Progress Notes (Signed)
  Electrophysiology Office Note:   Date:  05/14/2024  ID:  Christian [name], DOB 24-Jul-1946, MRN [id number]  Primary Cardiologist: Christian Keas, MD Primary Heart Failure: None Electrophysiologist: Christian Range Cortland Ding, MD      History of Present Illness:   Christian [name] is a 78 y.o. male with h/o coronary disease, hypertension, hyperlipidemia, PVCs, atrial fibrillation seen today for routine electrophysiology followup.   Since last being seen in our clinic the patient reports doing overall well.  He does have episodes where his heart rate is slow and he feels fatigued and short of breath.  He brings in recordings from his Apple Watch that shows sinus rhythm with PVCs.  Most the time he feels well and is without major complaint.  He is able to do most of his daily activities without restriction.  He is been trying to lose weight by swimming.  he denies chest pain, palpitations, dyspnea, PND, orthopnea, nausea, vomiting, dizziness, syncope, edema, weight gain, or early satiety.   Review of systems complete and found to be negative unless listed in HPI.   EP Information / Studies Reviewed:    EKG is ordered today. Personal review as below.  EKG Interpretation Date/Time:  Tuesday May 14 2024 09:45:25 EDT Ventricular Rate:  59 PR Interval:  172 QRS Duration:  84 QT Interval:  414 QTC Calculation: 409 R Axis:   -16  Text Interpretation: Sinus bradycardia Low voltage QRS When compared with ECG of 19-Dec-2023 09:08, No significant change was found Confirmed by Christian [name] ([id number]) on 05/14/2024 10:05:16 AM     Risk Assessment/Calculations:    CHA2DS2-VASc Score = 4   This indicates a 4.8% annual risk of stroke. The patient's score is based upon: CHF History: 0 HTN History: 1 Diabetes History: 0 Stroke History: 0 Vascular Disease History: 1 Age Score: 2 Gender Score: 0            Physical Exam:   VS:  BP 136/70 (BP Location: Right Arm, Patient Position: Sitting, Cuff Size:  Large)   Pulse (!) 59   Ht 5\' 11"  (1.803 m)   Wt 258 lb (117 kg)   SpO2 96%   BMI 35.98 kg/m    Wt Readings from Last 3 Encounters:  05/14/24 258 lb (117 kg)  12/19/23 248 lb 9.6 oz (112.8 kg)  12/05/23 247 lb 9.6 oz (112.3 kg)     GEN: Well nourished, well developed in no acute distress NECK: No JVD; No carotid bruits CARDIAC: Regular rate and rhythm, no murmurs, rubs, gallops RESPIRATORY:  Clear to auscultation without rales, wheezing or rhonchi  ABDOMEN: Soft, non-tender, non-distended EXTREMITIES:  No edema; No deformity   ASSESSMENT AND PLAN:    1.  Persistent atrial fibrillation: Post ablation 09/05/2023.  Amiodarone  has been stopped.  He has had no further episodes of atrial fibrillation.  Continue with current management.  2.  Secondary hypercoagulable state: On Xarelto   3.  Coronary artery disease: Nonobstructive based on PET scan.  Plan per primary cardiology.  4.  Hypertension: Well-controlled  5.  PVCs: Appears to have PVCs on his Apple watch.  He feels at times fatigued and short of breath.  Christian [name] plan for 2-week monitor.  Follow up with EP APP in 12 months  Signed, Christian Mitschke Cortland Ding, MD

## 2024-05-14 ENCOUNTER — Ambulatory Visit

## 2024-05-14 ENCOUNTER — Encounter: Payer: Self-pay | Admitting: Cardiology

## 2024-05-14 ENCOUNTER — Ambulatory Visit: Payer: Medicare Other | Attending: Cardiology | Admitting: Cardiology

## 2024-05-14 VITALS — BP 136/70 | HR 59 | Ht 71.0 in | Wt 258.0 lb

## 2024-05-14 DIAGNOSIS — I4819 Other persistent atrial fibrillation: Secondary | ICD-10-CM | POA: Diagnosis not present

## 2024-05-14 DIAGNOSIS — D6869 Other thrombophilia: Secondary | ICD-10-CM | POA: Insufficient documentation

## 2024-05-14 DIAGNOSIS — I10 Essential (primary) hypertension: Secondary | ICD-10-CM | POA: Diagnosis not present

## 2024-05-14 DIAGNOSIS — I2510 Atherosclerotic heart disease of native coronary artery without angina pectoris: Secondary | ICD-10-CM | POA: Insufficient documentation

## 2024-05-14 DIAGNOSIS — I493 Ventricular premature depolarization: Secondary | ICD-10-CM | POA: Insufficient documentation

## 2024-05-14 NOTE — Patient Instructions (Signed)
 Medication Instructions:  Your physician recommends that you continue on your current medications as directed. Please refer to the Current Medication list given to you today.  *If you need a refill on your cardiac medications before your next appointment, please call your pharmacy*  Lab Work: None ordered  If you have any lab test that is abnormal or we need to change your treatment, we will call you to review the results.  Testing/Procedures: Delane Fear- Long Term Monitor Instructions  Your physician has requested you wear a ZIO patch monitor for 14 days.  This is a single patch monitor. [employer] supplies one patch monitor per enrollment. Additional stickers are not available. Please do not apply patch if you will be having a Nuclear Stress Test,  Echocardiogram, Cardiac CT, MRI, or Chest Xray during the period you would be wearing the  monitor. The patch cannot be worn during these tests. You cannot remove and re-apply the  ZIO XT patch monitor.  Your ZIO patch monitor will be mailed 3 day USPS to your address on file. It may take 3-5 days  to receive your monitor after you have been enrolled.  Once you have received your monitor, please review the enclosed instructions. Your monitor  has already been registered assigning a specific monitor serial # to you.  Billing and Patient Assistance Program Information  We have supplied [employer] with any of your insurance information on file for billing purposes. [employer] offers a sliding scale Patient Assistance Program for patients that do not have  insurance, or whose insurance does not completely cover the cost of the ZIO monitor.  You must apply for the Patient Assistance Program to qualify for this discounted rate.  To apply, please call [employer] at [phone], select option 4, select option 2, ask to apply for  Patient Assistance Program. Sanna Crystal will ask your household income, and how many people  are in your household. They will quote your  out-of-pocket cost based on that information.  [employer] will also be able to set up a 88-month, interest-free payment plan if needed.  Applying the monitor   Shave hair from upper left chest.  Hold abrader disc by orange tab. Rub abrader in 40 strokes over the upper left chest as  indicated in your monitor instructions.  Clean area with 4 enclosed alcohol pads. Let dry.  Apply patch as indicated in monitor instructions. Patch will be placed under collarbone on left  side of chest with arrow pointing upward.  Rub patch adhesive wings for 2 minutes. Remove white label marked "1". Remove the white  label marked "2". Rub patch adhesive wings for 2 additional minutes.  While looking in a mirror, press and release button in center of patch. A small green light will  flash 3-4 times. This will be your only indicator that the monitor has been turned on.  Do not shower for the first 24 hours. You may shower after the first 24 hours.  Press the button if you feel a symptom. You will hear a small click. Record Date, Time and  Symptom in the Patient Logbook.  When you are ready to remove the patch, follow instructions on the last 2 pages of Patient  Logbook. Stick patch monitor onto the last page of Patient Logbook.  Place Patient Logbook in the blue and white box. Use locking tab on box and tape box closed  securely. The blue and white box has prepaid postage on it. Please place it in the mailbox as  soon as possible. Your physician should have your test results approximately 7 days after the  monitor has been mailed back to [facility]  Call [facility] Customer Care at [phone] if you have questions regarding  your ZIO XT patch monitor. Call them immediately if you see an orange light blinking on your  monitor.  If your monitor falls off in less than 4 days, contact our Monitor department at [phone].  If your monitor becomes loose or falls off after 4 days call [employer] at  [phone] for  suggestions on securing your monitor   Follow-Up: At [facility], you and your health needs are our priority.  As part of our continuing mission to provide you with exceptional heart care, our providers are all part of one team.  This team includes your primary Cardiologist (physician) and Advanced Practice Providers or APPs (Physician Assistants and Nurse Practitioners) who all work together to provide you with the care you need, when you need it.  Your next appointment:   1 year(s)  Provider:   You will follow up in the Atrial Fibrillation Clinic located at [facility]. Your provider will be: Clint R. Fenton, PA-C or Minnie Amber, PA-C     Thank you for choosing [employer]!!   Reece Cane, RN [phone]

## 2024-05-14 NOTE — Progress Notes (Unsigned)
 Enrolled for [employer] to mail a ZIO XT long term holter monitor to the patients address on file.

## 2024-05-14 NOTE — Addendum Note (Signed)
 Addended by: Alvenia Aus on: 05/14/2024 10:13 AM   Modules accepted: Orders

## 2024-05-25 DIAGNOSIS — I2510 Atherosclerotic heart disease of native coronary artery without angina pectoris: Secondary | ICD-10-CM | POA: Diagnosis not present

## 2024-05-25 DIAGNOSIS — H35033 Hypertensive retinopathy, bilateral: Secondary | ICD-10-CM | POA: Diagnosis not present

## 2024-05-25 DIAGNOSIS — E7800 Pure hypercholesterolemia, unspecified: Secondary | ICD-10-CM | POA: Diagnosis not present

## 2024-05-27 ENCOUNTER — Other Ambulatory Visit: Payer: Self-pay | Admitting: Cardiology

## 2024-05-27 DIAGNOSIS — E7800 Pure hypercholesterolemia, unspecified: Secondary | ICD-10-CM

## 2024-06-02 ENCOUNTER — Encounter: Payer: Self-pay | Admitting: Cardiology

## 2024-06-05 DIAGNOSIS — I493 Ventricular premature depolarization: Secondary | ICD-10-CM | POA: Diagnosis not present

## 2024-06-07 ENCOUNTER — Ambulatory Visit: Payer: Self-pay | Admitting: Cardiology

## 2024-06-07 DIAGNOSIS — I493 Ventricular premature depolarization: Secondary | ICD-10-CM | POA: Diagnosis not present

## 2024-06-14 MED ORDER — MEXILETINE HCL 250 MG PO CAPS: 250.0000 mg | ORAL_CAPSULE | Freq: Two times a day (BID) | ORAL | 3 refills | Status: DC

## 2024-06-24 DIAGNOSIS — E7800 Pure hypercholesterolemia, unspecified: Secondary | ICD-10-CM | POA: Diagnosis not present

## 2024-06-24 DIAGNOSIS — I2510 Atherosclerotic heart disease of native coronary artery without angina pectoris: Secondary | ICD-10-CM | POA: Diagnosis not present

## 2024-06-24 DIAGNOSIS — H35033 Hypertensive retinopathy, bilateral: Secondary | ICD-10-CM | POA: Diagnosis not present

## 2024-07-05 ENCOUNTER — Ambulatory Visit: Admitting: Physician Assistant

## 2024-07-12 ENCOUNTER — Encounter: Payer: Self-pay | Admitting: Advanced Practice Midwife

## 2024-07-25 DIAGNOSIS — E7800 Pure hypercholesterolemia, unspecified: Secondary | ICD-10-CM | POA: Diagnosis not present

## 2024-07-25 DIAGNOSIS — H35033 Hypertensive retinopathy, bilateral: Secondary | ICD-10-CM | POA: Diagnosis not present

## 2024-07-25 DIAGNOSIS — I2510 Atherosclerotic heart disease of native coronary artery without angina pectoris: Secondary | ICD-10-CM | POA: Diagnosis not present

## 2024-08-15 DIAGNOSIS — H40013 Open angle with borderline findings, low risk, bilateral: Secondary | ICD-10-CM | POA: Diagnosis not present

## 2024-08-15 DIAGNOSIS — H26491 Other secondary cataract, right eye: Secondary | ICD-10-CM | POA: Diagnosis not present

## 2024-08-25 DIAGNOSIS — E7800 Pure hypercholesterolemia, unspecified: Secondary | ICD-10-CM | POA: Diagnosis not present

## 2024-08-25 DIAGNOSIS — H35033 Hypertensive retinopathy, bilateral: Secondary | ICD-10-CM | POA: Diagnosis not present

## 2024-08-25 DIAGNOSIS — I2510 Atherosclerotic heart disease of native coronary artery without angina pectoris: Secondary | ICD-10-CM | POA: Diagnosis not present

## 2024-09-05 ENCOUNTER — Other Ambulatory Visit: Payer: Self-pay | Admitting: Nurse Practitioner

## 2024-09-05 DIAGNOSIS — I10 Essential (primary) hypertension: Secondary | ICD-10-CM

## 2024-09-14 ENCOUNTER — Other Ambulatory Visit: Payer: Self-pay

## 2024-09-14 ENCOUNTER — Emergency Department (HOSPITAL_BASED_OUTPATIENT_CLINIC_OR_DEPARTMENT_OTHER)

## 2024-09-14 ENCOUNTER — Emergency Department (HOSPITAL_BASED_OUTPATIENT_CLINIC_OR_DEPARTMENT_OTHER): Admission: EM | Admit: 2024-09-14 | Discharge: 2024-09-14 | Disposition: A | Discharge status: Home / Self Care

## 2024-09-14 ENCOUNTER — Encounter (HOSPITAL_BASED_OUTPATIENT_CLINIC_OR_DEPARTMENT_OTHER): Payer: Self-pay

## 2024-09-14 DIAGNOSIS — Z79899 Other long term (current) drug therapy: Secondary | ICD-10-CM | POA: Diagnosis not present

## 2024-09-14 DIAGNOSIS — M25531 Pain in right wrist: Secondary | ICD-10-CM | POA: Diagnosis present

## 2024-09-14 DIAGNOSIS — S52571A Other intraarticular fracture of lower end of right radius, initial encounter for closed fracture: Secondary | ICD-10-CM | POA: Diagnosis not present

## 2024-09-14 DIAGNOSIS — W1830XA Fall on same level, unspecified, initial encounter: Secondary | ICD-10-CM | POA: Insufficient documentation

## 2024-09-14 DIAGNOSIS — S61511A Laceration without foreign body of right wrist, initial encounter: Secondary | ICD-10-CM | POA: Diagnosis not present

## 2024-09-14 DIAGNOSIS — S0990XA Unspecified injury of head, initial encounter: Secondary | ICD-10-CM | POA: Insufficient documentation

## 2024-09-14 DIAGNOSIS — S01511A Laceration without foreign body of lip, initial encounter: Secondary | ICD-10-CM | POA: Diagnosis not present

## 2024-09-14 DIAGNOSIS — I10 Essential (primary) hypertension: Secondary | ICD-10-CM | POA: Insufficient documentation

## 2024-09-14 DIAGNOSIS — Z23 Encounter for immunization: Secondary | ICD-10-CM | POA: Diagnosis not present

## 2024-09-14 DIAGNOSIS — Z7901 Long term (current) use of anticoagulants: Secondary | ICD-10-CM | POA: Diagnosis not present

## 2024-09-14 DIAGNOSIS — I4891 Unspecified atrial fibrillation: Secondary | ICD-10-CM | POA: Insufficient documentation

## 2024-09-14 DIAGNOSIS — I771 Stricture of artery: Secondary | ICD-10-CM | POA: Diagnosis not present

## 2024-09-14 DIAGNOSIS — M1811 Unilateral primary osteoarthritis of first carpometacarpal joint, right hand: Secondary | ICD-10-CM | POA: Diagnosis not present

## 2024-09-14 DIAGNOSIS — R9082 White matter disease, unspecified: Secondary | ICD-10-CM | POA: Diagnosis not present

## 2024-09-14 MED ORDER — HYDROCODONE-ACETAMINOPHEN 5-325 MG PO TABS
2.0000 | ORAL_TABLET | Freq: Four times a day (QID) | ORAL | 0 refills | Status: AC | PRN
Start: 2024-09-14 — End: ?

## 2024-09-14 MED ORDER — LIDOCAINE-EPINEPHRINE (PF) 2 %-1:200000 IJ SOLN
10.0000 mL | Freq: Once | INTRAMUSCULAR | Status: AC
  Administered 2024-09-14: 10 mL
  Filled 2024-09-14: qty 20

## 2024-09-14 MED ORDER — HYDROCODONE-ACETAMINOPHEN 5-325 MG PO TABS
1.0000 | ORAL_TABLET | Freq: Once | ORAL | Status: AC
  Administered 2024-09-14: 1 via ORAL
  Filled 2024-09-14: qty 1

## 2024-09-14 MED ORDER — TETANUS-DIPHTH-ACELL PERTUSSIS 5-2.5-18.5 LF-MCG/0.5 IM SUSY
0.5000 mL | PREFILLED_SYRINGE | Freq: Once | INTRAMUSCULAR | Status: AC
  Administered 2024-09-14: 0.5 mL via INTRAMUSCULAR
  Filled 2024-09-14: qty 0.5

## 2024-09-14 NOTE — Discharge Instructions (Signed)
 Please keep your arm in the splint.  Take the hydrocodone  as needed for pain.  Follow-up with your orthopedist for reevaluation and return to the emergency department for new or worsening symptoms.  The sutures in your lip should dissolve on their own.  You may also pick up some bacitracin cream or ointment and put this over your abrasions on the face.

## 2024-09-14 NOTE — ED Provider Notes (Signed)
 [place] EMERGENCY DEPARTMENT AT [facility] Provider Note   CSN: [id number] Arrival date & time: 09/14/24  9161     Patient presents with: Christian [name] is a 78 y.o. male.   78 year old male with past medical history of atrial fibrillation on Xarelto  as well as hypertension presenting to the emergency department today after a fall.  The patient states he lost his footing and fell forward.  Did strike his face.  Did not lose consciousness.  He had some bleeding from his upper lip.  He is also having pain in his right wrist.  He came to the emergency department for further evaluation regarding this.  He denies any other injuries.  He is on the Xarelto  and is taking this as prescribed.   Fall       Prior to Admission medications   Medication Sig Start Date End Date Taking? Authorizing Provider  HYDROcodone -acetaminophen  (NORCO/VICODIN) 5-325 MG tablet Take 2 tablets by mouth every 6 (six) hours as needed for moderate pain (pain score 4-6) or severe pain (pain score 7-10). 09/14/24  Yes Ula Prentice SAUNDERS, MD  diltiazem  (CARDIZEM  CD) 120 MG 24 hr capsule TAKE 1 CAPSULE BY MOUTH EVERY DAY 03/01/24   Parthenia Olivia HERO, PA-C  FIBER PO Take 1 Dose by mouth daily. 1 dose = 2 tablespoons    [provider]  furosemide  (LASIX ) 40 MG tablet TAKE ONE TABLET (40 MG) BY MOUTH EVERY DAY FOR THREE DAYS ONLY THEN TAKE ONE TABLET EVERY OTHER DAY THEREAFTER. 01/17/24   Ross, Paula V, MD  ibuprofen (ADVIL) 200 MG tablet Take 400 mg by mouth every 6 (six) hours as needed for moderate pain.    [provider]  irbesartan  (AVAPRO ) 150 MG tablet TAKE 1 TABLET BY MOUTH EVERY DAY 01/17/24   [name] Vina GAILS, MD  isosorbide  mononitrate (IMDUR ) 60 MG 24 hr tablet Take 1 tablet (60 mg total) by mouth daily. Please call [phone] to schedule an appointment with Dr. Wilbert Bihari for future refills. Thank you. 09/05/24   Bihari Wilbert SAUNDERS, MD  mexiletine (MEXITIL) 250 MG capsule Take 1  capsule (250 mg total) by mouth 2 (two) times daily. 06/14/24   Camnitz, Soyla Lunger, MD  polyethylene glycol (MIRALAX  / GLYCOLAX ) 17 g packet Take 17 g by mouth daily as needed for moderate constipation.    [provider]  potassium chloride  (KLOR-CON ) 10 MEQ tablet TAKE ONE TABLET EVERY DAY FOR THREE DAYS ONLY THEN TAKE ONE TABLET EVERY OTHER DAY THEREAFTER. 03/06/24   [name] Vina GAILS, MD  rosuvastatin  (CRESTOR ) 20 MG tablet TAKE 1 TABLET BY MOUTH EVERY DAY 05/28/24   Bihari Wilbert SAUNDERS, MD  XARELTO  20 MG TABS tablet TAKE 1 TABLET BY MOUTH DAILY WITH SUPPER. 02/05/24   [name] Vina GAILS, MD    Allergies: Atorvastatin  calcium  [atorvastatin ], Simvastatin, and Pravastatin    Review of Systems  Musculoskeletal:  Positive for arthralgias.  All other systems reviewed and are negative.   Updated Vital Signs BP (!) 143/80   Pulse 82   Temp 98.7 F (37.1 C)   Resp 20   SpO2 96%   Physical Exam Gen: NAD Eyes: PERRL, EOMI HEENT: no oropharyngeal swelling, laceration involving the wet vermillion border of the right upper lip, abrasions noted over the skin of the right upper and lower lips Neck: trachea midline, no cervical spine tenderness, no stepoffs or deformities Resp: clear to auscultation bilaterally Card: RRR, no murmurs, rubs, or gallops Abd:  nontender, nondistended, no seatbelt sign Extremities: no calf tenderness, no edema, tender over medial and lateral aspect of right wrist with no gross deformity, compartments are soft, remainder of extremities atraumatic MSK: no thoracic spinal tenderness, no lumbar spinal tenderness, no step-offs or deformities, tender over  Vascular: 2+ radial pulses bilaterally, 2+ DP pulses bilaterally Neuro: Alert and oriented x 3, equal strength sensation throughout bilateral upper and lower extremities Skin: no rashes  (all labs ordered are listed, but only abnormal results are displayed) Labs Reviewed - No data to display  EKG: EKG  Interpretation Date/Time:  Saturday September 14 2024 09:11:20 EDT Ventricular Rate:  87 PR Interval:  128 QRS Duration:  108 QT Interval:  375 QTC Calculation: 452 R Axis:   -29  Text Interpretation: Sinus rhythm Ventricular trigeminy Borderline left axis deviation Low voltage, precordial leads RSR' in V1 or V2, probably normal variant Borderline T abnormalities, anterior leads Confirmed by Ula Barter [phone]) on 09/14/2024 9:13:33 AM  Radiology: ARCOLA Wrist Complete Right Result Date: 09/14/2024 CLINICAL DATA:  78 year old male status post trip and fall onto face in Street. Laceration. On blood thinners. EXAM: RIGHT WRIST - COMPLETE 3+ VIEW COMPARISON:  None Available. FINDINGS: Four views 0935 hours. Bone mineralization is within normal limits for age. Comminuted but minimally displaced fracture of the distal left radius involving the metadiaphysis, radiocarpal joint. DRU appears less affected. See images #2 and #4). Distal ulna appears to remain intact. There is some underlying right wrist chondrocalcinosis. Carpal bones intact and aligned. Chronic moderate to severe 1st CMC joint, underlying distal carpal joint space loss, osteophytosis, subchondral sclerosis. Proximal metacarpals appear intact. IMPRESSION: 1. Subtle comminuted and intra-articular fracture of the distal right radius, radiocarpal joint. 2. 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 No other acute osseous abnormality identified. Electronically Signed   By: VEAR Hurst M.D.   On: 09/14/2024 09:55   DG Chest Portable 1 View Result Date: 09/14/2024 CLINICAL DATA:  78 year old male status post trip and fall onto face in Street. Laceration. On blood thinners. EXAM: PORTABLE CHEST 1 VIEW COMPARISON:  Cardiac CT 08/29/2023. Chest radiographs 01/29/2019 and earlier. FINDINGS: Portable AP semi upright view at 0936 hours. Chronic tortuosity of the thoracic aorta. Stable cardiac size and mediastinal contours. Visualized tracheal air  column is within normal limits. Lung volumes are stable since 2020, within normal limits. Allowing for portable technique the lungs are clear. No pneumothorax or pleural effusion. No acute osseous abnormality identified. Chronic glenohumeral degeneration. Negative visible bowel gas. IMPRESSION: No acute cardiopulmonary abnormality or acute traumatic injury identified. Electronically Signed   By: VEAR Hurst M.D.   On: 09/14/2024 09:52   CT Head Wo Contrast Result Date: 09/14/2024 CLINICAL DATA:  78 year old male status post trip and fall onto face in Street. Laceration. On blood thinners. EXAM: CT HEAD WITHOUT CONTRAST TECHNIQUE: Contiguous axial images were obtained from the base of the skull through the vertex without intravenous contrast. RADIATION DOSE REDUCTION: This exam was performed according to the departmental dose-optimization program which includes automated exposure control, adjustment of the mA and/or kV according to patient size and/or use of iterative reconstruction technique. COMPARISON:  None Available. FINDINGS: Brain: Normal cerebral volume for age. No midline shift, ventriculomegaly, mass effect, evidence of mass lesion, intracranial hemorrhage or evidence of cortically based acute infarction. Gray-white matter differentiation is within normal limits throughout the brain. Vascular: Calcified atherosclerosis at the skull base. No suspicious intracranial vascular hyperdensity. Skull: Intact.  No acute osseous abnormality identified. Sinuses/Orbits: Visualized  paranasal sinuses and mastoids are well aerated. Small right maxillary sinus mucous retention cyst. Other: Postoperative changes to both globes. No acute orbit or scalp soft tissue injury identified. IMPRESSION: No acute traumatic injury identified. Normal for age noncontrast CT appearance of the brain. Electronically Signed   By: VEAR Hurst M.D.   On: 09/14/2024 09:51     .Laceration Repair  Date/Time: 09/14/2024 10:26 AM  Performed by:  Ula Prentice SAUNDERS, MD Authorized by: Ula Prentice SAUNDERS, MD   Consent:    Consent obtained:  Verbal   Consent given by:  Patient   Risks discussed:  Infection, pain, poor cosmetic result, need for additional repair, poor wound healing, vascular damage and nerve damage   Alternatives discussed:  No treatment Universal protocol:    Immediately prior to procedure, a time out was called: yes     Patient identity confirmed:  Verbally with patient Anesthesia:    Anesthesia method:  Local infiltration   Local anesthetic:  Lidocaine  2% WITH epi Laceration details:    Location:  Lip   Lip location:  Upper interior lip   Length (cm):  2   Depth (mm):  3 Pre-procedure details:    Preparation:  Patient was prepped and draped in usual sterile fashion Exploration:    Hemostasis achieved with:  Epinephrine    Wound exploration: wound explored through full range of motion     Wound extent: fascia not violated, no foreign body, no signs of injury, no nerve damage, no tendon damage, no underlying fracture and no vascular damage     Contaminated: no   Treatment:    Area cleansed with:  Saline   Amount of cleaning:  Standard   Debridement:  None   Undermining:  None   Scar revision: no   Skin repair:    Repair method:  Sutures   Suture size:  5-0   Suture material:  Chromic gut   Suture technique:  Simple interrupted   Number of sutures:  2 Approximation:    Approximation:  Close   Vermilion border well-aligned: yes   Repair type:    Repair type:  Simple Post-procedure details:    Dressing:  Open (no dressing)   Procedure completion:  Tolerated    Medications Ordered in the ED  lidocaine -EPINEPHrine  (XYLOCAINE  W/EPI) 2 %-1:200000 (PF) injection 10 mL (10 mLs Infiltration Given by Other 09/14/24 0915)  HYDROcodone -acetaminophen  (NORCO/VICODIN) 5-325 MG per tablet 1 tablet (1 tablet Oral Given 09/14/24 0915)  Tdap (BOOSTRIX ) injection 0.5 mL (0.5 mLs Intramuscular Given 09/14/24 0915)                                     Medical Decision Making 78 year old male with past medical history of atrial fibrillation on Xarelto  as well as hypertension presenting to the emergency department today after a mechanical fall prior to arrival.  Patient is bradycardic here on arrival.  Will obtain EKG and keep patient with cardiac monitor regarding this although this was a mechanical fall.  Will obtain a CT scan of the patient patient's head with him being on Xarelto .  Will obtain x-ray of his right wrist.  I will reevaluate for ultimate disposition.  He will need some sutures for his lip laceration.  Will update his tetanus.  The patient's wrist x-ray does show a distal radius fracture.  He is placed in a sugar-tong splint.  CT scan is unremarkable.  The  patient's laceration is repaired.  He is discharged with return precautions.  Amount and/or Complexity of Data Reviewed Radiology: ordered.  Risk Prescription drug management.        Final diagnoses:  Other closed intra-articular fracture of distal end of right radius, initial encounter  Lip laceration, initial encounter    ED Discharge Orders          Ordered    HYDROcodone -acetaminophen  (NORCO/VICODIN) 5-325 MG tablet  Every 6 hours PRN        09/14/24 1026               Ula Prentice SAUNDERS, MD 09/14/24 1030

## 2024-09-14 NOTE — ED Triage Notes (Addendum)
 States tripped over tree branch, hit face on street. Lac noted to R side of upper lip, bleeding controlled in triage. Swelling noted to R wrist.   Denies headache, dizziness, NV, blurry vision On blood thinners.  Denies LOC

## 2024-09-14 NOTE — ED Notes (Signed)
 Pt has lac to R upper lip and abrasion to face in same region. A couple scrapes to both knees and R wrist pain.

## 2024-09-15 DIAGNOSIS — S52571A Other intraarticular fracture of lower end of right radius, initial encounter for closed fracture: Secondary | ICD-10-CM | POA: Diagnosis not present

## 2024-09-17 ENCOUNTER — Ambulatory Visit (INDEPENDENT_AMBULATORY_CARE_PROVIDER_SITE_OTHER): Admitting: Physician Assistant

## 2024-09-17 DIAGNOSIS — S62101A Fracture of unspecified carpal bone, right wrist, initial encounter for closed fracture: Secondary | ICD-10-CM | POA: Insufficient documentation

## 2024-09-17 NOTE — Progress Notes (Signed)
 Office Visit Note   Patient: Christian [name]           Date of Birth: 1946/01/12           MRN: [id number] Visit Date: 09/17/2024              Requested by: Lanis Thresa BROCKS, PA-C [address] [place],  [place] [address] PCP: Lanis Thresa BROCKS, PA-C   Assessment & Plan: Visit Diagnoses:  1. Right wrist fracture, closed, initial encounter     Plan: Patient is a pleasant 78 year old gentleman who is 3 days status falling forward after he tripped over a tree branch.  He was seen and evaluated for injury or trauma as he is on chronic anticoagulation.  He was seen in the ER.  Any type of head injury was ruled out.  He also complained of right wrist pain x-ray taken which did show a minimally displaced intra-articular distal radius fracture on the right.  He is left-hand dominant.  Comes in today with a splint.  I did review the x-rays and exam with Dr. Jerri.  Fracture is minimally displaced.  Patient also has significant degenerative changes especially at his thumb.  Could go into a removable wrist splint.  He understands that he can take this off to wash his hand but otherwise should wear it full-time and should not use the hand to lift anything.  He understands if he does not do this has a higher chance of displacing the fracture.  Will follow-up in 2 weeks with new x-rays  Follow-Up Instructions: Return in about 2 weeks (around 10/01/2024).   Orders:  No orders of the defined types were placed in this encounter.  No orders of the defined types were placed in this encounter.     Procedures: No procedures performed   Clinical Data: No additional findings.   Subjective: Chief Complaint  Patient presents with   Right Wrist - Pain    HPI pleasant 78 year old left-hand-dominant gentleman comes in today 3 days status post falling forward onto his right wrist.  Was seen and evaluated in the emergency room.  X-rays there demonstrated an intra-articular but minimally displaced  distal radius fracture.  Review of Systems  All other systems reviewed and are negative.    Objective: Vital Signs: There were no vitals taken for this visit.  Physical Exam Constitutional:      Appearance: Normal appearance.  Pulmonary:     Effort: Pulmonary effort is normal.  Skin:    General: Skin is warm and dry.  Neurological:     General: No focal deficit present.     Mental Status: He is alert and oriented to person, place, and time.  Psychiatric:        Mood and Affect: Mood normal.        Behavior: Behavior normal.     Ortho Exam Splint was removed skin is in good condition he has a easily palpable radial pulse.  Brisk capillary refill in his fingers can move all his fingers without difficulty he is tender over the distal radius no significant deformity compartments are all soft Specialty Comments:  No specialty comments available.  Imaging: No results found.   PMFS History: Patient Active Problem List   Diagnosis Date Noted   Right wrist fracture, closed, initial encounter 09/17/2024   Primary osteoarthritis, right shoulder 04/03/2024   BMI 32.0-32.9,adult 08/02/2023   Encounter for monitoring amiodarone  therapy 06/08/2023   Hypercoagulable state due to persistent  atrial fibrillation (HCC) 04/10/2023   Visceral obesity 03/20/2023   Other fatigue 02/16/2023   Persistent atrial fibrillation (HCC) 02/14/2023   Retention of fluid 01/31/2023   BMI 31.0-31.9,adult 01/31/2023   Essential hypertension 11/02/2022   Polyphagia 09/19/2022   Insulin  resistance 09/19/2022   Class 2 severe obesity with serious comorbidity and body mass index (BMI) of 39.0 to 39.9 in adult 09/19/2022   Other hyperlipidemia 07/18/2022   Vitamin D  deficiency 07/18/2022   Sarcopenia 05/11/2022   History of malignant neoplasm of skin 04/06/2022   Melanocytic nevi of trunk 04/06/2022   Other seborrheic keratosis 04/06/2022   History of obesity in adulthood 04/06/2022   Anal fissure  07/07/2021   Herniation of lumbar intervertebral disc with radiculopathy 03/30/2021    Class: Acute   Status post lumbar laminectomy 03/30/2021   Restless legs 02/15/2021   Hypertensive retinopathy 02/15/2021   Constipation 02/15/2021   Chronic diastolic heart failure (HCC) 01/26/2021   Other forms of angina pectoris 01/26/2021   Low back pain 12/16/2020   Primary osteoarthritis of left knee 02/05/2019   History of left knee replacement 02/05/2019   PVC's (premature ventricular contractions)    Dyspnea 12/06/2016   Osteoarthritis of right hip 09/22/2015   S/P total hip arthroplasty 09/22/2015   Generalized obesity 05/08/2014   Osteoarthritis of knee 05/08/2014   S/P total knee replacement using cement 05/06/2014   Knee pain 04/08/2014   Preoperative clearance 04/08/2014   Coronary artery disease    Diastolic dysfunction    Hypertension    Orthostatic hypotension    Hypercholesterolemia    Past Medical History:  Diagnosis Date   Arthritis    joints; shoulders; left elbow; right thumb (05/06/2014)   Atrial tachycardia    Breathing problem    Cataracts, bilateral    immature   Chest pain    Constipation    takes Colace and Metamucil daily   Coronary artery disease 02/2011   cath 11/2016 showing 20% RCA, 50% OM, 35% mid LCx, 70% D1, 65% mid LAD, 70% distal LAD 80% on medical management   Diastolic dysfunction    GERD (gastroesophageal reflux disease)    Heart murmur    Hemorrhoids    History of bronchitis    Hypercholesterolemia    takes Crestor  daily   Hypertension    Joint pain    Joint pain    Nocturia    Orthostatic hypotension    Palpitations    Premature atrial contractions    PVC's (premature ventricular contractions)    Restless leg syndrome    Rheumatoid arthritis (HCC)    Sciatica    Sinus bradycardia     Family History  Problem Relation Age of Onset   CVA Mother    Stroke Mother    CVA Father    Heart disease Father    High blood pressure  Father    High Cholesterol Father    Stroke Father     Past Surgical History:  Procedure Laterality Date   ACHILLES TENDON REPAIR Left    & fixed a spur   ATRIAL FIBRILLATION ABLATION N/A 09/05/2023   Procedure: ATRIAL FIBRILLATION ABLATION;  Surgeon: Inocencio Soyla Lunger, MD;  Location: [facility];  Service: Cardiovascular;  Laterality: N/A;   CARDIAC CATHETERIZATION  2012   CARDIAC CATHETERIZATION N/A 12/06/2016   Procedure: Left Heart Cath and Coronary Angiography;  Surgeon: Victory LELON Sharps, MD;  Location: [facility] INVASIVE CV LAB;  Service: Cardiovascular;  Laterality: N/A;   CARDIOVERSION  N/A 02/14/2023   Procedure: CARDIOVERSION;  Surgeon: Hobart Powell BRAVO, MD;  Location: [facility] ENDOSCOPY;  Service: Cardiovascular;  Laterality: N/A;   CARDIOVERSION N/A 05/23/2023   Procedure: CARDIOVERSION;  Surgeon: Loni Soyla LABOR, MD;  Location: [facility];  Service: Cardiovascular;  Laterality: N/A;   COLONOSCOPY     FLEXIBLE SIGMOIDOSCOPY  X 2   HAND SURGERY Left    thumb; cleaned out arthritis   HEEL SPUR EXCISION Left    KNEE ARTHROSCOPY Right    LUMBAR LAMINECTOMY/DECOMPRESSION MICRODISCECTOMY N/A 03/30/2021   Procedure: LEFT L3 HEMILAMINECTOMY WITH EXCISION OF DISC HERNIATION;  Surgeon: Lucilla Lynwood BRAVO, MD;  Location: [facility] OR;  Service: Orthopedics;  Laterality: N/A;   SHOULDER SURGERY Right X 2   cleaned out spurs   TEE WITHOUT CARDIOVERSION N/A 02/14/2023   Procedure: TRANSESOPHAGEAL ECHOCARDIOGRAM (TEE);  Surgeon: Hobart Powell BRAVO, MD;  Location: [facility] ENDOSCOPY;  Service: Cardiovascular;  Laterality: N/A;   TOE FUSION Right    great toe; plates and screws   TONSILLECTOMY  ~ 1950   TOTAL HIP ARTHROPLASTY Right 09/22/2015   Procedure: TOTAL HIP ARTHROPLASTY;  Surgeon: Maude LELON Right, MD;  Location: [facility] OR;  Service: Orthopedics;  Laterality: Right;   TOTAL KNEE ARTHROPLASTY Right 05/06/2014   TOTAL KNEE ARTHROPLASTY Right 05/06/2014   Procedure: RIGHT TOTAL KNEE  ARTHROPLASTY;  Surgeon: Maude LELON Right, MD;  Location: [facility] OR;  Service: Orthopedics;  Laterality: Right;   TOTAL KNEE ARTHROPLASTY Left 02/05/2019   Procedure: LEFT TOTAL KNEE ARTHROPLASTY;  Surgeon: Right Maude LELON, MD;  Location: [facility] OR;  Service: Orthopedics;  Laterality: Left;   Social History   Occupational History   Occupation: Retired  Tobacco Use   Smoking status: Former    Current packs/day: 0.00    Average packs/day: 2.0 packs/day for 10.0 years (20.0 ttl pk-yrs)    Types: Cigarettes    Start date: 60    Quit date: 1981    Years since quitting: 44.7   Smokeless tobacco: Never   Tobacco comments:    Former smoker (quit smoking in 1981) 10/03/23  Vaping Use   Vaping status: Never Used  Substance and Sexual Activity   Alcohol use: Not Currently    Comment: drink a beer sometimes once/month   Drug use: No   Sexual activity: Never

## 2024-09-18 ENCOUNTER — Other Ambulatory Visit: Payer: Self-pay | Admitting: Internal Medicine

## 2024-09-24 DIAGNOSIS — H35033 Hypertensive retinopathy, bilateral: Secondary | ICD-10-CM | POA: Diagnosis not present

## 2024-09-24 DIAGNOSIS — E7800 Pure hypercholesterolemia, unspecified: Secondary | ICD-10-CM | POA: Diagnosis not present

## 2024-09-24 DIAGNOSIS — I2510 Atherosclerotic heart disease of native coronary artery without angina pectoris: Secondary | ICD-10-CM | POA: Diagnosis not present

## 2024-10-01 ENCOUNTER — Encounter: Payer: Self-pay | Admitting: Physician Assistant

## 2024-10-01 ENCOUNTER — Other Ambulatory Visit (INDEPENDENT_AMBULATORY_CARE_PROVIDER_SITE_OTHER): Payer: Self-pay

## 2024-10-01 ENCOUNTER — Ambulatory Visit (INDEPENDENT_AMBULATORY_CARE_PROVIDER_SITE_OTHER): Admitting: Physician Assistant

## 2024-10-01 DIAGNOSIS — S62101A Fracture of unspecified carpal bone, right wrist, initial encounter for closed fracture: Secondary | ICD-10-CM | POA: Diagnosis not present

## 2024-10-01 NOTE — Progress Notes (Signed)
 Office Visit Note   Patient: Christian [name]           Date of Birth: 1946/05/01           MRN: [id number] Visit Date: 10/01/2024              Requested by: Lanis Thresa BROCKS, PA-C [address] [place] [address] [place],  [place] [address] PCP: Lanis Thresa BROCKS, PA-C  Chief Complaint  Patient presents with   Right Wrist - Follow-up      HPI: Mr. Braley returns today he is now 2 weeks status post minimally displaced intra-articular distal radius fracture on the right.  I have been treating him with a splint which he says he has been wearing for the most part all the time.  Assessment & Plan: Visit Diagnoses:  1. Right wrist fracture, closed, initial encounter     Plan: X-rays look good today no change and I would like him to wear the splint for another 3 weeks and rex-ray him.  At that time could consider starting him in some occupational therapy  Follow-Up Instructions: Return in about 3 weeks (around 10/22/2024).   Ortho Exam  Patient is alert, oriented, no adenopathy, well-dressed, normal affect, normal respiratory effort. Examination of his right wrist his hand is warm he has good pulses he still has some tenderness over the distal radius.  Brisk capillary refill    Imaging: No results found. No images are attached to the encounter.  Labs: Lab Results  Component Value Date   HGBA1C 5.1 07/18/2022   HGBA1C 5.3 09/28/2021   HGBA1C 5.4 06/24/2020   REPTSTATUS 01/30/2019 FINAL 01/29/2019   GRAMSTAIN NO WBC SEEN NO ORGANISMS SEEN 01/07/2010   CULT  01/29/2019    NO GROWTH Performed at [facility], [address] [place], [place] [address]      Lab Results  Component Value Date   ALBUMIN  4.0 12/05/2023   ALBUMIN  3.9 01/26/2023   ALBUMIN  3.9 07/18/2022    Lab Results  Component Value Date   MG 2.2 12/06/2019   Lab Results  Component Value Date   VD25OH 59.4 07/18/2022   VD25OH 58.9 09/28/2021   VD25OH 36.6 01/26/2021    No results found for:  PREALBUMIN    Latest Ref Rng & Units 12/05/2023    9:15 AM 08/22/2023    9:51 AM 05/23/2023    9:14 AM  CBC EXTENDED  WBC 3.4 - 10.8 x10E3/uL 7.0  8.0    RBC 4.14 - 5.80 x10E6/uL 4.35  4.19    Hemoglobin 13.0 - 17.7 g/dL 85.3  86.1  84.6   HCT 37.5 - 51.0 % 43.9  41.5  45.0   Platelets 150 - 450 x10E3/uL 173  174       There is no height or weight on file to calculate BMI.  Orders:  Orders Placed This Encounter  Procedures   XR Wrist Complete Right   No orders of the defined types were placed in this encounter.    Procedures: No procedures performed  Clinical Data: No additional findings.  ROS:  All other systems negative, except as noted in the HPI. Review of Systems  Objective: Vital Signs: There were no vitals taken for this visit.  Specialty Comments:  No specialty comments available.  PMFS History: Patient Active Problem List   Diagnosis Date Noted   Right wrist fracture, closed, initial encounter 09/17/2024   Primary osteoarthritis, right shoulder 04/03/2024   BMI 32.0-32.9,adult 08/02/2023  Encounter for monitoring amiodarone  therapy 06/08/2023   Hypercoagulable state due to persistent atrial fibrillation (HCC) 04/10/2023   Visceral obesity 03/20/2023   Other fatigue 02/16/2023   Persistent atrial fibrillation (HCC) 02/14/2023   Retention of fluid 01/31/2023   BMI 31.0-31.9,adult 01/31/2023   Essential hypertension 11/02/2022   Polyphagia 09/19/2022   Insulin  resistance 09/19/2022   Class 2 severe obesity with serious comorbidity and body mass index (BMI) of 39.0 to 39.9 in adult 09/19/2022   Other hyperlipidemia 07/18/2022   Vitamin D  deficiency 07/18/2022   Sarcopenia 05/11/2022   History of malignant neoplasm of skin 04/06/2022   Melanocytic nevi of trunk 04/06/2022   Other seborrheic keratosis 04/06/2022   History of obesity in adulthood 04/06/2022   Anal fissure 07/07/2021   Herniation of lumbar intervertebral disc with radiculopathy  03/30/2021    Class: Acute   Status post lumbar laminectomy 03/30/2021   Restless legs 02/15/2021   Hypertensive retinopathy 02/15/2021   Constipation 02/15/2021   Chronic diastolic heart failure (HCC) 01/26/2021   Other forms of angina pectoris 01/26/2021   Low back pain 12/16/2020   Primary osteoarthritis of left knee 02/05/2019   History of left knee replacement 02/05/2019   PVC's (premature ventricular contractions)    Dyspnea 12/06/2016   Osteoarthritis of right hip 09/22/2015   S/P total hip arthroplasty 09/22/2015   Generalized obesity 05/08/2014   Osteoarthritis of knee 05/08/2014   S/P total knee replacement using cement 05/06/2014   Knee pain 04/08/2014   Preoperative clearance 04/08/2014   Coronary artery disease    Diastolic dysfunction    Hypertension    Orthostatic hypotension    Hypercholesterolemia    Past Medical History:  Diagnosis Date   Arthritis    joints; shoulders; left elbow; right thumb (05/06/2014)   Atrial tachycardia    Breathing problem    Cataracts, bilateral    immature   Chest pain    Constipation    takes Colace and Metamucil daily   Coronary artery disease 02/2011   cath 11/2016 showing 20% RCA, 50% OM, 35% mid LCx, 70% D1, 65% mid LAD, 70% distal LAD 80% on medical management   Diastolic dysfunction    GERD (gastroesophageal reflux disease)    Heart murmur    Hemorrhoids    History of bronchitis    Hypercholesterolemia    takes Crestor  daily   Hypertension    Joint pain    Joint pain    Nocturia    Orthostatic hypotension    Palpitations    Premature atrial contractions    PVC's (premature ventricular contractions)    Restless leg syndrome    Rheumatoid arthritis (HCC)    Sciatica    Sinus bradycardia     Family History  Problem Relation Age of Onset   CVA Mother    Stroke Mother    CVA Father    Heart disease Father    High blood pressure Father    High Cholesterol Father    Stroke Father     Past Surgical  History:  Procedure Laterality Date   ACHILLES TENDON REPAIR Left    & fixed a spur   ATRIAL FIBRILLATION ABLATION N/A 09/05/2023   Procedure: ATRIAL FIBRILLATION ABLATION;  Surgeon: Inocencio Soyla Lunger, MD;  Location: [facility];  Service: Cardiovascular;  Laterality: N/A;   CARDIAC CATHETERIZATION  2012   CARDIAC CATHETERIZATION N/A 12/06/2016   Procedure: Left Heart Cath and Coronary Angiography;  Surgeon: Victory LELON Sharps, MD;  Location:  [facility];  Service: Cardiovascular;  Laterality: N/A;   CARDIOVERSION N/A 02/14/2023   Procedure: CARDIOVERSION;  Surgeon: Hobart Powell BRAVO, MD;  Location: [facility] ENDOSCOPY;  Service: Cardiovascular;  Laterality: N/A;   CARDIOVERSION N/A 05/23/2023   Procedure: CARDIOVERSION;  Surgeon: Loni Soyla LABOR, MD;  Location: [facility];  Service: Cardiovascular;  Laterality: N/A;   COLONOSCOPY     FLEXIBLE SIGMOIDOSCOPY  X 2   HAND SURGERY Left    thumb; cleaned out arthritis   HEEL SPUR EXCISION Left    KNEE ARTHROSCOPY Right    LUMBAR LAMINECTOMY/DECOMPRESSION MICRODISCECTOMY N/A 03/30/2021   Procedure: LEFT L3 HEMILAMINECTOMY WITH EXCISION OF DISC HERNIATION;  Surgeon: Lucilla Lynwood BRAVO, MD;  Location: [facility] OR;  Service: Orthopedics;  Laterality: N/A;   SHOULDER SURGERY Right X 2   cleaned out spurs   TEE WITHOUT CARDIOVERSION N/A 02/14/2023   Procedure: TRANSESOPHAGEAL ECHOCARDIOGRAM (TEE);  Surgeon: Hobart Powell BRAVO, MD;  Location: [facility] ENDOSCOPY;  Service: Cardiovascular;  Laterality: N/A;   TOE FUSION Right    great toe; plates and screws   TONSILLECTOMY  ~ 1950   TOTAL HIP ARTHROPLASTY Right 09/22/2015   Procedure: TOTAL HIP ARTHROPLASTY;  Surgeon: Maude LELON Right, MD;  Location: [facility] OR;  Service: Orthopedics;  Laterality: Right;   TOTAL KNEE ARTHROPLASTY Right 05/06/2014   TOTAL KNEE ARTHROPLASTY Right 05/06/2014   Procedure: RIGHT TOTAL KNEE ARTHROPLASTY;  Surgeon: Maude LELON Right, MD;  Location: [facility] OR;  Service:  Orthopedics;  Laterality: Right;   TOTAL KNEE ARTHROPLASTY Left 02/05/2019   Procedure: LEFT TOTAL KNEE ARTHROPLASTY;  Surgeon: Right Maude LELON, MD;  Location: [facility] OR;  Service: Orthopedics;  Laterality: Left;   Social History   Occupational History   Occupation: Retired  Tobacco Use   Smoking status: Former    Current packs/day: 0.00    Average packs/day: 2.0 packs/day for 10.0 years (20.0 ttl pk-yrs)    Types: Cigarettes    Start date: 73    Quit date: 1981    Years since quitting: 44.7   Smokeless tobacco: Never   Tobacco comments:    Former smoker (quit smoking in 1981) 10/03/23  Vaping Use   Vaping status: Never Used  Substance and Sexual Activity   Alcohol use: Not Currently    Comment: drink a beer sometimes once/month   Drug use: No   Sexual activity: Never

## 2024-10-06 ENCOUNTER — Other Ambulatory Visit: Payer: Self-pay | Admitting: Cardiology

## 2024-10-21 ENCOUNTER — Encounter: Payer: Self-pay | Admitting: Cardiology

## 2024-10-21 DIAGNOSIS — Z87898 Personal history of other specified conditions: Secondary | ICD-10-CM | POA: Diagnosis not present

## 2024-10-21 DIAGNOSIS — Z6838 Body mass index (BMI) 38.0-38.9, adult: Secondary | ICD-10-CM | POA: Diagnosis not present

## 2024-10-21 DIAGNOSIS — I5032 Chronic diastolic (congestive) heart failure: Secondary | ICD-10-CM | POA: Diagnosis not present

## 2024-10-21 DIAGNOSIS — M542 Cervicalgia: Secondary | ICD-10-CM | POA: Diagnosis not present

## 2024-10-21 DIAGNOSIS — I4819 Other persistent atrial fibrillation: Secondary | ICD-10-CM | POA: Diagnosis not present

## 2024-10-21 DIAGNOSIS — R5383 Other fatigue: Secondary | ICD-10-CM | POA: Diagnosis not present

## 2024-10-21 NOTE — Telephone Encounter (Signed)
 Looks like patient has been on the use of this medication. Is there a process for this?

## 2024-10-21 NOTE — Telephone Encounter (Signed)
 Spoke verbally with patient about this.  Pt reports he is doing well but wonders/thinks this medication is causing dizziness. Aware it is time for routine follow up after starting this new medication. Aware office will contact him to arrange OV w/ EP APP (or [facility] if opening). Pt agreeable to plan

## 2024-10-22 ENCOUNTER — Encounter: Payer: Self-pay | Admitting: Physician Assistant

## 2024-10-22 ENCOUNTER — Ambulatory Visit (INDEPENDENT_AMBULATORY_CARE_PROVIDER_SITE_OTHER): Admitting: Physician Assistant

## 2024-10-22 ENCOUNTER — Other Ambulatory Visit: Payer: Self-pay | Admitting: Radiology

## 2024-10-22 ENCOUNTER — Other Ambulatory Visit: Payer: Self-pay

## 2024-10-22 DIAGNOSIS — S62101A Fracture of unspecified carpal bone, right wrist, initial encounter for closed fracture: Secondary | ICD-10-CM

## 2024-10-22 NOTE — Progress Notes (Signed)
 Office Visit Note   Patient: Christian [name]           Date of Birth: [birth year]           MRN: [id number] Visit Date: 10/22/2024              Requested by: Lanis Thresa BROCKS, PA-C [address] [place],  [place] [address] PCP: Lanis Thresa BROCKS, PA-C  Chief Complaint  Patient presents with   Right Wrist - Follow-up      HPI: Patient is a pleasant left-hand-dominant gentleman comes in 6 weeks status post right distal radius fracture.  He has been immobilized in a splint.  He reports his pain is improving.  Assessment & Plan: Visit Diagnoses:  1. Right wrist fracture, closed, initial encounter     Plan: Have discussion with him he is at a higher risk for arthritis.  He does have quite a bit of stiffness in his wrist would like for him to engage with occupational therapy.  Should still use the brace and at risk situations.  Can follow-up with me for 1 final visit in 4 to 6 weeks  Follow-Up Instructions: No follow-ups on file.   Ortho Exam  Patient is alert, oriented, no adenopathy, well-dressed, normal affect, normal respiratory effort. Examination of his right wrist he has mild soft tissue swelling no erythema he has stiffness with extension and flexion still some mildly tender to palpation over the distal radius but improving.  Grip strength is fair no paresthesias    Imaging: No results found. No images are attached to the encounter.  Labs: Lab Results  Component Value Date   HGBA1C 5.1 07/18/2022   HGBA1C 5.3 09/28/2021   HGBA1C 5.4 06/24/2020   REPTSTATUS 01/30/2019 FINAL 01/29/2019   GRAMSTAIN NO WBC SEEN NO ORGANISMS SEEN 01/07/2010   CULT  01/29/2019    NO GROWTH Performed at [facility], [address], [place], [place] [address]      Lab Results  Component Value Date   ALBUMIN  4.0 12/05/2023   ALBUMIN  3.9 01/26/2023   ALBUMIN  3.9 07/18/2022    Lab Results  Component Value Date   MG 2.2 12/06/2019   Lab Results  Component  Value Date   VD25OH 59.4 07/18/2022   VD25OH 58.9 09/28/2021   VD25OH 36.6 01/26/2021    No results found for: PREALBUMIN    Latest Ref Rng & Units 12/05/2023    9:15 AM 08/22/2023    9:51 AM 05/23/2023    9:14 AM  CBC EXTENDED  WBC 3.4 - 10.8 x10E3/uL 7.0  8.0    RBC 4.14 - 5.80 x10E6/uL 4.35  4.19    Hemoglobin 13.0 - 17.7 g/dL 85.3  86.1  84.6   HCT 37.5 - 51.0 % 43.9  41.5  45.0   Platelets 150 - 450 x10E3/uL 173  174       There is no height or weight on file to calculate BMI.  Orders:  Orders Placed This Encounter  Procedures   XR Wrist Complete Right   No orders of the defined types were placed in this encounter.    Procedures: No procedures performed  Clinical Data: No additional findings.  ROS:  All other systems negative, except as noted in the HPI. Review of Systems  Objective: Vital Signs: There were no vitals taken for this visit.  Specialty Comments:  No specialty comments available.  PMFS History: Patient Active Problem List   Diagnosis Date Noted  Right wrist fracture, closed, initial encounter 09/17/2024   Primary osteoarthritis, right shoulder 04/03/2024   BMI 32.0-32.9,adult 08/02/2023   Encounter for monitoring amiodarone  therapy 06/08/2023   Hypercoagulable state due to persistent atrial fibrillation (HCC) 04/10/2023   Visceral obesity 03/20/2023   Other fatigue 02/16/2023   Persistent atrial fibrillation (HCC) 02/14/2023   Retention of fluid 01/31/2023   BMI 31.0-31.9,adult 01/31/2023   Essential hypertension 11/02/2022   Polyphagia 09/19/2022   Insulin  resistance 09/19/2022   Class 2 severe obesity with serious comorbidity and body mass index (BMI) of 39.0 to 39.9 in adult 09/19/2022   Other hyperlipidemia 07/18/2022   Vitamin D  deficiency 07/18/2022   Sarcopenia 05/11/2022   History of malignant neoplasm of skin 04/06/2022   Melanocytic nevi of trunk 04/06/2022   Other seborrheic keratosis 04/06/2022   History of obesity  in adulthood 04/06/2022   Anal fissure 07/07/2021   Herniation of lumbar intervertebral disc with radiculopathy 03/30/2021    Class: Acute   Status post lumbar laminectomy 03/30/2021   Restless legs 02/15/2021   Hypertensive retinopathy 02/15/2021   Constipation 02/15/2021   Chronic diastolic heart failure (HCC) 01/26/2021   Other forms of angina pectoris 01/26/2021   Low back pain 12/16/2020   Primary osteoarthritis of left knee 02/05/2019   History of left knee replacement 02/05/2019   PVC's (premature ventricular contractions)    Dyspnea 12/06/2016   Osteoarthritis of right hip 09/22/2015   S/P total hip arthroplasty 09/22/2015   Generalized obesity 05/08/2014   Osteoarthritis of knee 05/08/2014   S/P total knee replacement using cement 05/06/2014   Knee pain 04/08/2014   Preoperative clearance 04/08/2014   Coronary artery disease    Diastolic dysfunction    Hypertension    Orthostatic hypotension    Hypercholesterolemia    Past Medical History:  Diagnosis Date   Arthritis    joints; shoulders; left elbow; right thumb (05/06/2014)   Atrial tachycardia    Breathing problem    Cataracts, bilateral    immature   Chest pain    Constipation    takes Colace and Metamucil daily   Coronary artery disease 02/2011   cath 11/2016 showing 20% RCA, 50% OM, 35% mid LCx, 70% D1, 65% mid LAD, 70% distal LAD 80% on medical management   Diastolic dysfunction    GERD (gastroesophageal reflux disease)    Heart murmur    Hemorrhoids    History of bronchitis    Hypercholesterolemia    takes Crestor  daily   Hypertension    Joint pain    Joint pain    Nocturia    Orthostatic hypotension    Palpitations    Premature atrial contractions    PVC's (premature ventricular contractions)    Restless leg syndrome    Rheumatoid arthritis (HCC)    Sciatica    Sinus bradycardia     Family History  Problem Relation Age of Onset   CVA Mother    Stroke Mother    CVA Father    Heart  disease Father    High blood pressure Father    High Cholesterol Father    Stroke Father     Past Surgical History:  Procedure Laterality Date   ACHILLES TENDON REPAIR Left    & fixed a spur   ATRIAL FIBRILLATION ABLATION N/A 09/05/2023   Procedure: ATRIAL FIBRILLATION ABLATION;  Surgeon: Inocencio Soyla Lunger, MD;  Location: [facility] INVASIVE CV LAB;  Service: Cardiovascular;  Laterality: N/A;   CARDIAC CATHETERIZATION  2012  CARDIAC CATHETERIZATION N/A 12/06/2016   Procedure: Left Heart Cath and Coronary Angiography;  Surgeon: Victory LELON Sharps, MD;  Location[facility] INVASIVE CV LAB;  Service: Cardiovascular;  Laterality: N/A;   CARDIOVERSION N/A 02/14/2023   Procedure: CARDIOVERSION;  Surgeon: Hobart Powell BRAVO, MD;  Location: [facility] ENDOSCOPY;  Service: Cardiovascular;  Laterality: N/A;   CARDIOVERSION N/A 05/23/2023   Procedure: CARDIOVERSION;  Surgeon: Loni Soyla LABOR, MD;  Location: [facility] INVASIVE CV LAB;  Service: Cardiovascular;  Laterality: N/A;   COLONOSCOPY     FLEXIBLE SIGMOIDOSCOPY  X 2   HAND SURGERY Left    thumb; cleaned out arthritis   HEEL SPUR EXCISION Left    KNEE ARTHROSCOPY Right    LUMBAR LAMINECTOMY/DECOMPRESSION MICRODISCECTOMY N/A 03/30/2021   Procedure: LEFT L3 HEMILAMINECTOMY WITH EXCISION OF DISC HERNIATION;  Surgeon: Lucilla Lynwood BRAVO, MD;  Location: [facility] OR;  Service: Orthopedics;  Laterality: N/A;   SHOULDER SURGERY Right X 2   cleaned out spurs   TEE WITHOUT CARDIOVERSION N/A 02/14/2023   Procedure: TRANSESOPHAGEAL ECHOCARDIOGRAM (TEE);  Surgeon: Hobart Powell BRAVO, MD;  Location: [facility] ENDOSCOPY;  Service: Cardiovascular;  Laterality: N/A;   TOE FUSION Right    great toe; plates and screws   TONSILLECTOMY  ~ 1950   TOTAL HIP ARTHROPLASTY Right 09/22/2015   Procedure: TOTAL HIP ARTHROPLASTY;  Surgeon: Maude LELON Right, MD;  Location: [facility] OR;  Service: Orthopedics;  Laterality: Right;   TOTAL KNEE ARTHROPLASTY Right 05/06/2014   TOTAL KNEE ARTHROPLASTY Right 05/06/2014    Procedure: RIGHT TOTAL KNEE ARTHROPLASTY;  Surgeon: Maude LELON Right, MD;  Location: [facility] OR;  Service: Orthopedics;  Laterality: Right;   TOTAL KNEE ARTHROPLASTY Left 02/05/2019   Procedure: LEFT TOTAL KNEE ARTHROPLASTY;  Surgeon: Right Maude LELON, MD;  Location: [facility] OR;  Service: Orthopedics;  Laterality: Left;   Social History   Occupational History   Occupation: Retired  Tobacco Use   Smoking status: Former    Current packs/day: 0.00    Average packs/day: 2.0 packs/day for 10.0 years (20.0 ttl pk-yrs)    Types: Cigarettes    Start date: 64    Quit date: 1981    Years since quitting: 44.8   Smokeless tobacco: Never   Tobacco comments:    Former smoker (quit smoking in 1981) 10/03/23  Vaping Use   Vaping status: Never Used  Substance and Sexual Activity   Alcohol use: Not Currently    Comment: drink a beer sometimes once/month   Drug use: No   Sexual activity: Never

## 2024-10-25 DIAGNOSIS — H35033 Hypertensive retinopathy, bilateral: Secondary | ICD-10-CM | POA: Diagnosis not present

## 2024-10-25 DIAGNOSIS — E7800 Pure hypercholesterolemia, unspecified: Secondary | ICD-10-CM | POA: Diagnosis not present

## 2024-10-25 DIAGNOSIS — I2510 Atherosclerotic heart disease of native coronary artery without angina pectoris: Secondary | ICD-10-CM | POA: Diagnosis not present

## 2024-10-28 ENCOUNTER — Encounter: Payer: Self-pay | Admitting: Radiology

## 2024-10-29 ENCOUNTER — Ambulatory Visit: Attending: Student | Admitting: Student

## 2024-10-29 ENCOUNTER — Encounter: Payer: Self-pay | Admitting: Student

## 2024-10-29 VITALS — BP 134/90 | HR 67 | Ht 71.0 in | Wt 259.0 lb

## 2024-10-29 DIAGNOSIS — D6869 Other thrombophilia: Secondary | ICD-10-CM | POA: Diagnosis not present

## 2024-10-29 DIAGNOSIS — I4819 Other persistent atrial fibrillation: Secondary | ICD-10-CM | POA: Diagnosis not present

## 2024-10-29 DIAGNOSIS — I493 Ventricular premature depolarization: Secondary | ICD-10-CM | POA: Diagnosis not present

## 2024-10-29 DIAGNOSIS — I10 Essential (primary) hypertension: Secondary | ICD-10-CM | POA: Diagnosis not present

## 2024-10-29 NOTE — Progress Notes (Signed)
  Electrophysiology Office Note:   Date:  10/29/2024  ID:  Christian [name], DOB [birth year], MRN [id number]  Primary Cardiologist: Wilbert Bihari, MD Electrophysiologist: Soyla Gladis Norton, MD   Electrophysiologist:  Soyla Gladis Norton, MD      History of Present Illness:   Christian [name] is a 78 y.o. male with h/o coronary disease, hypertension, hyperlipidemia, PVCs, and atrial fibrillation seen today for routine electrophysiology followup.   Since last being seen in our clinic the patient reports doing OK. He has had some mild dizziness, think possibly worse since starting mexitil, but unsure, and not bad enough that he wants to stop or decrease.  Mild SOB with prolonged exertion. Says he can walk for about 40 minutes, but takes 4-5 short breaks throughout. Otherwise, he denies chest pain, palpitations, PND, orthopnea, nausea, vomiting, syncope, edema, weight gain, or early satiety.   Review of systems complete and found to be negative unless listed in HPI.   EP Information / Studies Reviewed:    EKG is ordered today. Personal review as below.  EKG Interpretation Date/Time:  Tuesday October 29 2024 12:14:40 EST Ventricular Rate:  67 PR Interval:  194 QRS Duration:  86 QT Interval:  392 QTC Calculation: 414 R Axis:   -4  Text Interpretation: Sinus rhythm with occasional Premature ventricular complexes When compared with ECG of 14-Sep-2024 09:11, PREVIOUS ECG IS PRESENT Confirmed by Lesia Sharper [phone]) on 10/29/2024 12:15:46 PM    Arrhythmia/Device History S/p PVI 09/05/2023   Physical Exam:   VS:  BP (!) 134/90 (BP Location: Right Arm, Patient Position: Sitting, Cuff Size: Large)   Pulse 67   Ht 5' 11 (1.803 m)   Wt 259 lb (117.5 kg)   SpO2 96%   BMI 36.12 kg/m    Wt Readings from Last 3 Encounters:  10/29/24 259 lb (117.5 kg)  05/14/24 258 lb (117 kg)  12/19/23 248 lb 9.6 oz (112.8 kg)     GEN: No acute distress NECK: No JVD; No carotid bruits CARDIAC:  Regular rate and rhythm with occasional ectopy, no murmurs, rubs, gallops RESPIRATORY:  Clear to auscultation without rales, wheezing or rhonchi  ABDOMEN: Soft, non-tender, non-distended EXTREMITIES:  No edema; No deformity   ASSESSMENT AND PLAN:    Persistent AF S/p ablation 09/05/2023 EKG today shows NSR with occasional PVCs Continue Xarelto  for CHA2DS2/VASc of at least 5  Secondary hypercoagulable state Pt on Xarelto  as above    CAD No s/s of ischemia.      HTN Stable on current regimen   PVCs  Started on mexitil 250 mg BID for PVCs ~7.5% Mild dizziness on occasion. He's not sure if it's worse on mexitil, and doesn't want to make any changes at this time.   Follow up with EP Team in 6 months  Signed, Sharper Prentice Lesia, PA-C

## 2024-10-29 NOTE — Patient Instructions (Signed)
 Medication Instructions:  No medication changes today. *If you need a refill on your cardiac medications before your next appointment, please call your pharmacy*  Lab Work: No labwork ordered today. If you have labs (blood work) drawn today and your tests are completely normal, you will receive your results only by: MyChart Message (if you have MyChart) OR A paper copy in the mail If you have any lab test that is abnormal or we need to change your treatment, we will call you to review the results.  Testing/Procedures: No testing ordered today  Follow-Up: At [facility], you and your health needs are our priority.  As part of our continuing mission to provide you with exceptional heart care, our providers are all part of one team.  This team includes your primary Cardiologist (physician) and Advanced Practice Providers or APPs (Physician Assistants and Nurse Practitioners) who all work together to provide you with the care you need, when you need it.  Your next appointment:   6 month(s)  Provider:   You may see Will Cortland Ding, MD or one of the following Advanced Practice Providers on your designated Care Team:   Mertha Abrahams, [place] [address] [place], PA-C Suzann Riddle, NP Creighton Doffing, NP    We recommend signing up for the patient portal called "MyChart".  Sign up information is provided on this After Visit Summary.  MyChart is used to connect with patients for Virtual Visits (Telemedicine).  Patients are able to view lab/test results, encounter notes, upcoming appointments, etc.  Non-urgent messages can be sent to your provider as well.   To learn more about what you can do with MyChart, go to [email or web address].

## 2024-10-30 DIAGNOSIS — M25531 Pain in right wrist: Secondary | ICD-10-CM | POA: Diagnosis not present

## 2024-11-01 ENCOUNTER — Other Ambulatory Visit: Payer: Self-pay | Admitting: Physician Assistant

## 2024-11-01 DIAGNOSIS — I4819 Other persistent atrial fibrillation: Secondary | ICD-10-CM

## 2024-11-01 DIAGNOSIS — M25531 Pain in right wrist: Secondary | ICD-10-CM | POA: Diagnosis not present

## 2024-11-05 DIAGNOSIS — M25531 Pain in right wrist: Secondary | ICD-10-CM | POA: Diagnosis not present

## 2024-11-08 DIAGNOSIS — M25531 Pain in right wrist: Secondary | ICD-10-CM | POA: Diagnosis not present

## 2024-11-12 DIAGNOSIS — M25531 Pain in right wrist: Secondary | ICD-10-CM | POA: Diagnosis not present

## 2024-11-14 DIAGNOSIS — M25531 Pain in right wrist: Secondary | ICD-10-CM | POA: Diagnosis not present

## 2024-11-18 DIAGNOSIS — M25531 Pain in right wrist: Secondary | ICD-10-CM | POA: Diagnosis not present

## 2024-11-20 DIAGNOSIS — M25531 Pain in right wrist: Secondary | ICD-10-CM | POA: Diagnosis not present

## 2024-11-22 ENCOUNTER — Other Ambulatory Visit: Payer: Self-pay | Admitting: Cardiology

## 2024-11-22 DIAGNOSIS — E7800 Pure hypercholesterolemia, unspecified: Secondary | ICD-10-CM

## 2024-11-24 DIAGNOSIS — I2510 Atherosclerotic heart disease of native coronary artery without angina pectoris: Secondary | ICD-10-CM | POA: Diagnosis not present

## 2024-11-24 DIAGNOSIS — H35033 Hypertensive retinopathy, bilateral: Secondary | ICD-10-CM | POA: Diagnosis not present

## 2024-11-24 DIAGNOSIS — E7800 Pure hypercholesterolemia, unspecified: Secondary | ICD-10-CM | POA: Diagnosis not present

## 2024-11-26 ENCOUNTER — Encounter: Payer: Self-pay | Admitting: Cardiology

## 2024-11-26 ENCOUNTER — Ambulatory Visit: Admitting: Physician Assistant

## 2024-11-27 ENCOUNTER — Ambulatory Visit: Admitting: Physician Assistant

## 2024-12-14 ENCOUNTER — Other Ambulatory Visit: Payer: Self-pay | Admitting: Cardiology

## 2024-12-14 ENCOUNTER — Other Ambulatory Visit: Payer: Self-pay | Admitting: Internal Medicine

## 2024-12-24 ENCOUNTER — Other Ambulatory Visit: Payer: Self-pay | Admitting: Cardiology

## 2025-01-08 ENCOUNTER — Other Ambulatory Visit: Payer: Self-pay | Admitting: Cardiology

## 2025-01-09 NOTE — Telephone Encounter (Signed)
 In accordance with refill protocols, please review and address the following requirements before this medication refill can be authorized:  Labs

## 2025-01-10 ENCOUNTER — Encounter: Payer: Self-pay | Admitting: Cardiology

## 2025-01-10 ENCOUNTER — Other Ambulatory Visit: Payer: Self-pay

## 2025-01-10 MED ORDER — IRBESARTAN 150 MG PO TABS: 150.0000 mg | ORAL_TABLET | Freq: Every day | ORAL | 3 refills | Status: AC

## 2025-01-13 ENCOUNTER — Other Ambulatory Visit: Payer: Self-pay | Admitting: Cardiology

## 2025-01-13 ENCOUNTER — Other Ambulatory Visit: Payer: Self-pay

## 2025-01-13 DIAGNOSIS — Z79899 Other long term (current) drug therapy: Secondary | ICD-10-CM

## 2025-01-13 DIAGNOSIS — I2510 Atherosclerotic heart disease of native coronary artery without angina pectoris: Secondary | ICD-10-CM

## 2025-01-13 DIAGNOSIS — E7800 Pure hypercholesterolemia, unspecified: Secondary | ICD-10-CM

## 2025-01-13 NOTE — Telephone Encounter (Signed)
 Spoke with patient. Patient already has follow up on 01/16/25. Ordered labs, patient agrees to complete prior to appointment. Irbesartan  refilled for 30 days.

## 2025-01-16 ENCOUNTER — Ambulatory Visit: Admitting: Cardiology

## 2025-01-16 VITALS — BP 112/78 | HR 76 | Ht 72.0 in | Wt 267.0 lb

## 2025-01-16 DIAGNOSIS — I10 Essential (primary) hypertension: Secondary | ICD-10-CM | POA: Diagnosis present

## 2025-01-16 DIAGNOSIS — E7800 Pure hypercholesterolemia, unspecified: Secondary | ICD-10-CM | POA: Diagnosis not present

## 2025-01-16 DIAGNOSIS — R001 Bradycardia, unspecified: Secondary | ICD-10-CM | POA: Diagnosis not present

## 2025-01-16 DIAGNOSIS — I951 Orthostatic hypotension: Secondary | ICD-10-CM | POA: Diagnosis not present

## 2025-01-16 DIAGNOSIS — I2510 Atherosclerotic heart disease of native coronary artery without angina pectoris: Secondary | ICD-10-CM | POA: Insufficient documentation

## 2025-01-16 LAB — LIPID PANEL
Chol/HDL Ratio: 2.6 ratio (ref 0.0–5.0)
Cholesterol, Total: 132 mg/dL (ref 100–199)
HDL: 51 mg/dL
LDL Chol Calc (NIH): 69 mg/dL (ref 0–99)
Triglycerides: 58 mg/dL (ref 0–149)
VLDL Cholesterol Cal: 12 mg/dL (ref 5–40)

## 2025-01-16 LAB — COMPREHENSIVE METABOLIC PANEL WITH GFR
ALT: 6 IU/L (ref 0–44)
AST: 15 IU/L (ref 0–40)
Albumin: 3.8 g/dL (ref 3.8–4.8)
Alkaline Phosphatase: 71 IU/L (ref 47–123)
BUN/Creatinine Ratio: 13 (ref 10–24)
BUN: 16 mg/dL (ref 8–27)
Bilirubin Total: 0.6 mg/dL (ref 0.0–1.2)
CO2: 22 mmol/L (ref 20–29)
Calcium: 9.2 mg/dL (ref 8.6–10.2)
Chloride: 102 mmol/L (ref 96–106)
Creatinine, Ser: 1.19 mg/dL (ref 0.76–1.27)
Globulin, Total: 3 g/dL (ref 1.5–4.5)
Glucose: 85 mg/dL (ref 70–99)
Potassium: 4.8 mmol/L (ref 3.5–5.2)
Sodium: 138 mmol/L (ref 134–144)
Total Protein: 6.8 g/dL (ref 6.0–8.5)
eGFR: 63 mL/min/1.73

## 2025-01-16 MED ORDER — DILTIAZEM HCL ER COATED BEADS 180 MG PO CP24: 180.0000 mg | ORAL_CAPSULE | Freq: Every day | ORAL | 3 refills | Status: AC

## 2025-01-16 NOTE — Patient Instructions (Addendum)
 Medication Instructions:   Stop taking Avapro  ( irbesartan )   Start taking  Cardizem  CD 180 mg daily   Stop taking  Cardizem  (diltiazem  CD 120 mg)  *If you need a refill on your cardiac medications before your next appointment, please call your pharmacy*   Lab Work: Not needed If you have labs (blood work) drawn today and your tests are completely normal, you will receive your results only by: MyChart Message (if you have MyChart) OR A paper copy in the mail If you have any lab test that is abnormal or we need to change your treatment, we will call you to review the results.   Testing/Procedures: Not needed   Follow-Up: At [facility], you and your health needs are our priority.  As part of our continuing mission to provide you with exceptional heart care, we have created designated Provider Care Teams.  These Care Teams include your primary Cardiologist (physician) and Advanced Practice Providers (APPs -  Physician Assistants and Nurse Practitioners) who all work together to provide you with the care you need, when you need it.     Your next appointment:   3 week(s)  The format for your next appointment:   In Person  Provider:   You will follow up in the Atrial Fibrillation Clinic located at [facility]. Your provider will be: Clint R. Fenton, PA-C or Fairy Heinrich, PA-C   Traci Turner,MD  in 6 months    Other Instructions

## 2025-01-16 NOTE — Progress Notes (Signed)
 "   Date:  01/16/2025   ID:  Christian [name], DOB January 02, 1946, MRN [id number]  PCP:  Lanis Thresa BROCKS, PA-C  Cardiologist:  Wilbert Bihari, MD  Electrophysiologist:  Soyla Gladis Norton, MD   Evaluation Performed:  Follow-Up Visit  Chief Complaint:  CAD, HTN, orthostatic hypotension, HLD  History of Present Illness:    Christian [name] is a 79 y.o. male with CAD by cath 11/2016, HTN also with orthostatic hypertension, hyperlipidemia, arthritis, cataracts, HLD, PVCs, sciatica who presents for follow-up of blood pressure.   To recap prior history, he had a prior heart cath in 2017 showing mild-moderate LAD, circumflex and RCA disease without high grade obstruction. LVEDP was elevated, EF was 50%. Medical therapy was recommended. Nuclear stress test in 2019 for atypical CP was normal.  PVCs were noted during the study and f/u Holter was done 12/2016 showing NSR with frequent PACs, nonsustained atrial tachycardia up to 6-9 beats, and frequent PVCs (PVC load was 2.8%). He had issues with recurrent CP in 09/2018. Stress test was normal. Echo showed EF 60-65%, grade 1 DD, mild AI, mild LAE/RAE.   Repeat heart monitor showed sinus bradycardia, NSR to sinus tach, average HR 64 bpm, range 46->125bpm, frequent PVCs, load 5.5%, occasional PACs and nonsustained atrial tachycardia. The 46bpm was only during sleeping hours.   He is followed by EP for his PVCs and  A-fib.  His last heart monitor showed a PVC load of 7.5% so he started on Mexitil 250 mg twice daily.  The patient is here today and is doing well.  He tells me that over the past day his heart has been racing.  He checked his [facility] device which showed atrial fibrillation.  He has some mild soreness in his chest after the palpitations.  He denies any exertional CP but does have some DOE. His DOE is chronic due to sedentary state and has not changed any. He has a little LE edema from time to time. He denies any  PND, orthopnea,  dizziness or  syncope.  Past Medical History:  Diagnosis Date   Arthritis    joints; shoulders; left elbow; right thumb (05/06/2014)   Atrial tachycardia    Breathing problem    Cataracts, bilateral    immature   Chest pain    Constipation    takes Colace and Metamucil daily   Coronary artery disease 02/2011   cath 11/2016 showing 20% RCA, 50% OM, 35% mid LCx, 70% D1, 65% mid LAD, 70% distal LAD 80% on medical management   Diastolic dysfunction    GERD (gastroesophageal reflux disease)    Heart murmur    Hemorrhoids    History of bronchitis    Hypercholesterolemia    takes Crestor  daily   Hypertension    Joint pain    Joint pain    Nocturia    Orthostatic hypotension    Palpitations    Premature atrial contractions    PVC's (premature ventricular contractions)    Restless leg syndrome    Rheumatoid arthritis (HCC)    Sciatica    Sinus bradycardia    Past Surgical History:  Procedure Laterality Date   ACHILLES TENDON REPAIR Left    & fixed a spur   ATRIAL FIBRILLATION ABLATION N/A 09/05/2023   Procedure: ATRIAL FIBRILLATION ABLATION;  Surgeon: Norton Soyla Gladis, MD;  Location: [facility] INVASIVE CV LAB;  Service: Cardiovascular;  Laterality: N/A;   CARDIAC CATHETERIZATION  2012   CARDIAC CATHETERIZATION N/A 12/06/2016  Procedure: Left Heart Cath and Coronary Angiography;  Surgeon: Victory LELON Sharps, MD;  Location: [facility] INVASIVE CV LAB;  Service: Cardiovascular;  Laterality: N/A;   CARDIOVERSION N/A 02/14/2023   Procedure: CARDIOVERSION;  Surgeon: Hobart Powell BRAVO, MD;  Location: [facility] ENDOSCOPY;  Service: Cardiovascular;  Laterality: N/A;   CARDIOVERSION N/A 05/23/2023   Procedure: CARDIOVERSION;  Surgeon: Loni Soyla LABOR, MD;  Location: [facility] INVASIVE CV LAB;  Service: Cardiovascular;  Laterality: N/A;   COLONOSCOPY     FLEXIBLE SIGMOIDOSCOPY  X 2   HAND SURGERY Left    thumb; cleaned out arthritis   HEEL SPUR EXCISION Left    KNEE ARTHROSCOPY Right    LUMBAR  LAMINECTOMY/DECOMPRESSION MICRODISCECTOMY N/A 03/30/2021   Procedure: LEFT L3 HEMILAMINECTOMY WITH EXCISION OF DISC HERNIATION;  Surgeon: Lucilla Lynwood BRAVO, MD;  Location: [facility] OR;  Service: Orthopedics;  Laterality: N/A;   SHOULDER SURGERY Right X 2   cleaned out spurs   TEE WITHOUT CARDIOVERSION N/A 02/14/2023   Procedure: TRANSESOPHAGEAL ECHOCARDIOGRAM (TEE);  Surgeon: Hobart Powell BRAVO, MD;  Location: [facility] ENDOSCOPY;  Service: Cardiovascular;  Laterality: N/A;   TOE FUSION Right    great toe; plates and screws   TONSILLECTOMY  ~ 1950   TOTAL HIP ARTHROPLASTY Right 09/22/2015   Procedure: TOTAL HIP ARTHROPLASTY;  Surgeon: Maude LELON Right, MD;  Location: [facility] OR;  Service: Orthopedics;  Laterality: Right;   TOTAL KNEE ARTHROPLASTY Right 05/06/2014   TOTAL KNEE ARTHROPLASTY Right 05/06/2014   Procedure: RIGHT TOTAL KNEE ARTHROPLASTY;  Surgeon: Maude LELON Right, MD;  Location: [facility] OR;  Service: Orthopedics;  Laterality: Right;   TOTAL KNEE ARTHROPLASTY Left 02/05/2019   Procedure: LEFT TOTAL KNEE ARTHROPLASTY;  Surgeon: Right Maude LELON, MD;  Location: [facility] OR;  Service: Orthopedics;  Laterality: Left;     Current Meds  Medication Sig   diltiazem  (CARDIZEM  CD) 120 MG 24 hr capsule TAKE 1 CAPSULE BY MOUTH EVERY DAY   FIBER PO Take 1 Dose by mouth daily. 1 dose = 2 tablespoons   furosemide  (LASIX ) 40 MG tablet TAKE ONE TABLET (40 MG) BY MOUTH EVERY DAY FOR THREE DAYS ONLY THEN TAKE ONE TABLET EVERY OTHER DAY THEREAFTER.   HYDROcodone -acetaminophen  (NORCO/VICODIN) 5-325 MG tablet Take 2 tablets by mouth every 6 (six) hours as needed for moderate pain (pain score 4-6) or severe pain (pain score 7-10).   ibuprofen (ADVIL) 200 MG tablet Take 400 mg by mouth every 6 (six) hours as needed for moderate pain.   irbesartan  (AVAPRO ) 150 MG tablet TAKE 1 TABLET BY MOUTH EVERY DAY   irbesartan  (AVAPRO ) 150 MG tablet Take 1 tablet (150 mg total) by mouth daily.   isosorbide  mononitrate (IMDUR ) 60 MG 24 hr tablet  Take 1 tablet (60 mg total) by mouth daily. Please call [phone] to schedule an appointment with Dr. Wilbert Bihari for future refills. Thank you.   mexiletine (MEXITIL) 250 MG capsule TAKE 1 CAPSULE BY MOUTH 2 TIMES DAILY.   polyethylene glycol (MIRALAX  / GLYCOLAX ) 17 g packet Take 17 g by mouth daily as needed for moderate constipation.   potassium chloride  (KLOR-CON ) 10 MEQ tablet TAKE ONE TABLET EVERY DAY FOR THREE DAYS ONLY THEN TAKE ONE TABLET EVERY OTHER DAY THEREAFTER.   rosuvastatin  (CRESTOR ) 20 MG tablet TAKE 1 TABLET BY MOUTH EVERY DAY   XARELTO  20 MG TABS tablet TAKE 1 TABLET BY MOUTH DAILY WITH SUPPER.     Allergies:   Atorvastatin  calcium  [atorvastatin ], Simvastatin, and Pravastatin   Social History  Tobacco Use   Smoking status: Former    Current packs/day: 0.00    Average packs/day: 2.0 packs/day for 10.0 years (20.0 ttl pk-yrs)    Types: Cigarettes    Start date: 36    Quit date: 82    Years since quitting: 45.0   Smokeless tobacco: Never   Tobacco comments:    Former smoker (quit smoking in 1981) 10/03/23  Vaping Use   Vaping status: Never Used  Substance Use Topics   Alcohol use: Not Currently    Comment: drink a beer sometimes once/month   Drug use: No     Family Hx: The patient's family history includes CVA in his father and mother; Heart disease in his father; High Cholesterol in his father; High blood pressure in his father; Stroke in his father and mother.  ROS:   Please see the history of present illness.    All other systems reviewed and are negative.   Prior CV studies:    Most recent pertinent cardiac studies are outlined and summarized above. Reports included below if pertinent.  2D echo 09/2018 - Left ventricle: The cavity size was normal. Wall thickness was    normal. Systolic function was normal. The estimated ejection    fraction was in the range of 60% to 65%. Wall motion was normal;    there were no regional wall motion  abnormalities. Doppler    parameters are consistent with abnormal left ventricular    relaxation (grade 1 diastolic dysfunction).  - Aortic valve: Mildly to moderately calcified annulus. Mildly    thickened leaflets. There was mild regurgitation.  - Left atrium: The atrium was mildly dilated.  - Right atrium: The atrium was mildly dilated.     Labs/Other Tests and Data Reviewed:     Recent Labs: No results found for requested labs within last 365 days.   Recent Lipid Panel Lab Results  Component Value Date/Time   CHOL 137 12/05/2023 09:15 AM   TRIG 63 12/05/2023 09:15 AM   HDL 62 12/05/2023 09:15 AM   CHOLHDL 2.2 12/05/2023 09:15 AM   CHOLHDL 2.9 04/27/2016 08:26 AM   LDLCALC 62 12/05/2023 09:15 AM    Wt Readings from Last 3 Encounters:  01/16/25 267 lb (121.1 kg)  10/29/24 259 lb (117.5 kg)  05/14/24 258 lb (117 kg)     Objective:    Vital Signs:  BP 112/78 (BP Location: Left Arm, Patient Position: Sitting, Cuff Size: Large)   Pulse 76   Ht 6' (1.829 m)   Wt 267 lb (121.1 kg)   SpO2 96%   BMI 36.21 kg/m    GEN: Well nourished, well developed in no acute distress HEENT: Normal NECK: No JVD; No carotid bruits LYMPHATICS: No lymphadenopathy CARDIAC:RRR, no murmurs, rubs, gallops RESPIRATORY:  Clear to auscultation without rales, wheezing or rhonchi  ABDOMEN: Soft, non-tender, non-distended MUSCULOSKELETAL:  No edema; No deformity  SKIN: Warm and dry NEUROLOGIC:  Alert and oriented x 3 PSYCHIATRIC:  Normal affect  ASSESSMENT & PLAN:    HTN -BP well-controlled today on exam at 112/78 mmHg -Continue Imdur  60mg  daily  -stop Avapro  -increasing Cardizem  CD to 180mg  daily for suppression of afib -I have asked him to check his BP twice daily for a week and call with results  ASCAD - cath in 2017 showing mild-moderate LAD, circumflex and RCA disease without high grade obstruction. - Coronary heart rate calcium  score 1887 on CT cardiac morphology/pulmonary vein  study 09/15/2023 - Denies any exertional CP.  He had some soreness in his chest after having the afib.episode.  - Continue Crestor  20 mg daily and Imdur  60 mg daily with as needed refills - No aspirin  due to DOAC  PVCs -last event monitor showed PVC load of 7.5% -He does not notice the skipped beats like he used to -Continue Mexitil 250 mg twice daily  Bradycardia  - Resolved with heart rate 76 bpm today  HLD -LDL goal < 70 -I have personally reviewed and interpreted outside labs performed by patient's PCP which showed LDL 75 and HDL 49 on 12/23/2024 -He just had an FLP done today and results are pending -For now continue Crestor  20mg  daily  Orthostatic hypotension -this is stable with no recent dizziness unless he stands up too fast -encouraged him to stay hydrated when out in the heat  Persistent atrial fibrillation - Status post A-fib ablation 09/05/2023 - he had an episode of afib last week and then again this am lasting 1 hour this am and yesterday 2-3 hours - Now back in NSR today by exam - Continue Xarelto  20 mg daily for CHA2DS2-VASc score of 5  - increase Cardizem  CD to 180mg  daily for better suppression of afib - will get into afib clinic in 3 weeks  Medication Adjustments/Labs and Tests Ordered: Current medicines are reviewed at length with the patient today.  Concerns regarding medicines are outlined above.   Follow Up: afib clinic 3 weeks and me in 6 months  Signed, Wilbert Bihari, MD  01/16/2025 1:34 PM    [facility] "

## 2025-01-16 NOTE — Addendum Note (Signed)
 Addended by: GLADIS REENA GAILS on: 01/16/2025 01:50 PM   Modules accepted: Orders

## 2025-01-20 ENCOUNTER — Ambulatory Visit: Payer: Self-pay | Admitting: Cardiology

## 2025-01-25 ENCOUNTER — Other Ambulatory Visit: Payer: Self-pay | Admitting: Cardiology

## 2025-01-25 DIAGNOSIS — I4819 Other persistent atrial fibrillation: Secondary | ICD-10-CM

## 2025-01-31 ENCOUNTER — Encounter: Payer: Self-pay | Admitting: Cardiology

## 2025-02-06 ENCOUNTER — Ambulatory Visit (HOSPITAL_COMMUNITY): Admitting: Physician Assistant

## 2025-05-02 ENCOUNTER — Ambulatory Visit: Admitting: Student
